# Patient Record
Sex: Male | Born: 1980 | Race: White | Hispanic: No | Marital: Single | Smoking: Former smoker
Health system: Southern US, Community
[De-identification: ages and names within clinical notes are randomized; demographics above are authoritative.]

## PROBLEM LIST (undated history)

## (undated) ENCOUNTER — Emergency Department (HOSPITAL_COMMUNITY): Payer: Self-pay | Source: Home / Self Care

## (undated) ENCOUNTER — Ambulatory Visit (HOSPITAL_COMMUNITY): Disposition: A | Discharge status: Home / Self Care | Payer: BLUE CROSS/BLUE SHIELD

## (undated) DIAGNOSIS — A4902 Methicillin resistant Staphylococcus aureus infection, unspecified site: Secondary | ICD-10-CM

## (undated) DIAGNOSIS — F319 Bipolar disorder, unspecified: Secondary | ICD-10-CM

## (undated) DIAGNOSIS — K029 Dental caries, unspecified: Secondary | ICD-10-CM

## (undated) DIAGNOSIS — F1910 Other psychoactive substance abuse, uncomplicated: Secondary | ICD-10-CM

## (undated) DIAGNOSIS — E785 Hyperlipidemia, unspecified: Secondary | ICD-10-CM

## (undated) DIAGNOSIS — F411 Generalized anxiety disorder: Secondary | ICD-10-CM

## (undated) DIAGNOSIS — F1120 Opioid dependence, uncomplicated: Secondary | ICD-10-CM

## (undated) DIAGNOSIS — Z5900 Homelessness unspecified: Secondary | ICD-10-CM

## (undated) DIAGNOSIS — G8929 Other chronic pain: Secondary | ICD-10-CM

## (undated) HISTORY — PX: MEDIAL COLLATERAL LIGAMENT AND LATERAL COLLATERAL LIGAMENT REPAIR, KNEE: SHX2017

## (undated) HISTORY — PX: LUMBAR FUSION: SHX111

## (undated) HISTORY — PX: BACK SURGERY: SHX140

## (undated) HISTORY — PX: DENTAL SURGERY: SHX609

## (undated) HISTORY — PX: OTHER SURGICAL HISTORY: SHX169

---
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
## 2001-01-16 ENCOUNTER — Encounter: Payer: Self-pay | Admitting: Orthopedic Surgery

## 2001-01-16 ENCOUNTER — Ambulatory Visit (HOSPITAL_COMMUNITY): Admission: RE | Admit: 2001-01-16 | Discharge: 2001-01-16 | Payer: Self-pay | Admitting: Orthopedic Surgery

## 2001-02-14 ENCOUNTER — Ambulatory Visit (HOSPITAL_COMMUNITY): Admission: RE | Admit: 2001-02-14 | Discharge: 2001-02-15 | Payer: Self-pay | Admitting: *Deleted

## 2001-02-14 ENCOUNTER — Encounter: Payer: Self-pay | Admitting: *Deleted

## 2003-02-18 ENCOUNTER — Emergency Department (HOSPITAL_COMMUNITY): Admission: EM | Admit: 2003-02-18 | Discharge: 2003-02-18 | Payer: Self-pay | Admitting: Emergency Medicine

## 2003-11-14 ENCOUNTER — Emergency Department (HOSPITAL_COMMUNITY): Admission: EM | Admit: 2003-11-14 | Discharge: 2003-11-14 | Payer: Self-pay | Admitting: Emergency Medicine

## 2003-11-15 ENCOUNTER — Emergency Department (HOSPITAL_COMMUNITY): Admission: EM | Admit: 2003-11-15 | Discharge: 2003-11-15 | Payer: Self-pay | Admitting: Emergency Medicine

## 2004-03-01 ENCOUNTER — Emergency Department (HOSPITAL_COMMUNITY): Admission: EM | Admit: 2004-03-01 | Discharge: 2004-03-01 | Payer: Self-pay | Admitting: Emergency Medicine

## 2004-03-16 ENCOUNTER — Emergency Department (HOSPITAL_COMMUNITY): Admission: EM | Admit: 2004-03-16 | Discharge: 2004-03-16 | Payer: Self-pay | Admitting: Emergency Medicine

## 2004-04-13 ENCOUNTER — Emergency Department (HOSPITAL_COMMUNITY): Admission: EM | Admit: 2004-04-13 | Discharge: 2004-04-13 | Payer: Self-pay | Admitting: Emergency Medicine

## 2004-04-26 ENCOUNTER — Encounter: Payer: Self-pay | Admitting: Family Medicine

## 2004-04-26 ENCOUNTER — Emergency Department (HOSPITAL_COMMUNITY): Admission: EM | Admit: 2004-04-26 | Discharge: 2004-04-26 | Payer: Self-pay | Admitting: Family Medicine

## 2004-05-09 ENCOUNTER — Emergency Department (HOSPITAL_COMMUNITY): Admission: EM | Admit: 2004-05-09 | Discharge: 2004-05-09 | Payer: Self-pay | Admitting: Emergency Medicine

## 2004-05-20 ENCOUNTER — Emergency Department (HOSPITAL_COMMUNITY): Admission: EM | Admit: 2004-05-20 | Discharge: 2004-05-20 | Payer: Self-pay | Admitting: Emergency Medicine

## 2004-06-06 ENCOUNTER — Emergency Department (HOSPITAL_COMMUNITY): Admission: EM | Admit: 2004-06-06 | Discharge: 2004-06-06 | Payer: Self-pay | Admitting: Emergency Medicine

## 2004-07-30 ENCOUNTER — Emergency Department (HOSPITAL_COMMUNITY): Admission: EM | Admit: 2004-07-30 | Discharge: 2004-07-30 | Payer: Self-pay | Admitting: Emergency Medicine

## 2004-08-15 ENCOUNTER — Emergency Department (HOSPITAL_COMMUNITY): Admission: EM | Admit: 2004-08-15 | Discharge: 2004-08-15 | Payer: Self-pay | Admitting: Emergency Medicine

## 2005-09-22 ENCOUNTER — Emergency Department (HOSPITAL_COMMUNITY): Admission: EM | Admit: 2005-09-22 | Discharge: 2005-09-22 | Payer: Self-pay | Admitting: Emergency Medicine

## 2005-10-25 ENCOUNTER — Emergency Department (HOSPITAL_COMMUNITY): Admission: EM | Admit: 2005-10-25 | Discharge: 2005-10-25 | Payer: Self-pay | Admitting: Emergency Medicine

## 2005-11-12 ENCOUNTER — Emergency Department (HOSPITAL_COMMUNITY): Admission: EM | Admit: 2005-11-12 | Discharge: 2005-11-12 | Payer: Self-pay | Admitting: Emergency Medicine

## 2005-11-22 ENCOUNTER — Emergency Department (HOSPITAL_COMMUNITY): Admission: EM | Admit: 2005-11-22 | Discharge: 2005-11-22 | Payer: Self-pay | Admitting: Emergency Medicine

## 2005-11-26 ENCOUNTER — Emergency Department (HOSPITAL_COMMUNITY): Admission: EM | Admit: 2005-11-26 | Discharge: 2005-11-26 | Payer: Self-pay | Admitting: Family Medicine

## 2005-12-01 ENCOUNTER — Emergency Department (HOSPITAL_COMMUNITY): Admission: EM | Admit: 2005-12-01 | Discharge: 2005-12-01 | Payer: Self-pay | Admitting: Emergency Medicine

## 2005-12-05 ENCOUNTER — Emergency Department: Payer: Self-pay | Admitting: Internal Medicine

## 2005-12-15 ENCOUNTER — Emergency Department (HOSPITAL_COMMUNITY): Admission: EM | Admit: 2005-12-15 | Discharge: 2005-12-15 | Payer: Self-pay | Admitting: Emergency Medicine

## 2005-12-16 ENCOUNTER — Ambulatory Visit: Payer: Self-pay | Admitting: Family Medicine

## 2005-12-18 ENCOUNTER — Ambulatory Visit (HOSPITAL_COMMUNITY): Admission: RE | Admit: 2005-12-18 | Discharge: 2005-12-18 | Payer: Self-pay | Admitting: Internal Medicine

## 2006-01-09 ENCOUNTER — Emergency Department (HOSPITAL_COMMUNITY): Admission: EM | Admit: 2006-01-09 | Discharge: 2006-01-09 | Payer: Self-pay | Admitting: Emergency Medicine

## 2006-01-13 ENCOUNTER — Emergency Department: Payer: Self-pay | Admitting: Emergency Medicine

## 2006-01-19 ENCOUNTER — Emergency Department (HOSPITAL_COMMUNITY): Admission: EM | Admit: 2006-01-19 | Discharge: 2006-01-19 | Payer: Self-pay | Admitting: Family Medicine

## 2006-03-03 ENCOUNTER — Emergency Department (HOSPITAL_COMMUNITY): Admission: EM | Admit: 2006-03-03 | Discharge: 2006-03-03 | Payer: Self-pay | Admitting: Emergency Medicine

## 2006-03-08 ENCOUNTER — Emergency Department (HOSPITAL_COMMUNITY): Admission: EM | Admit: 2006-03-08 | Discharge: 2006-03-08 | Payer: Self-pay | Admitting: Emergency Medicine

## 2006-04-26 ENCOUNTER — Encounter
Admission: RE | Admit: 2006-04-26 | Discharge: 2006-07-25 | Payer: Self-pay | Admitting: Physical Medicine and Rehabilitation

## 2006-09-03 ENCOUNTER — Emergency Department (HOSPITAL_COMMUNITY): Admission: EM | Admit: 2006-09-03 | Discharge: 2006-09-03 | Payer: Self-pay | Admitting: Family Medicine

## 2006-09-11 ENCOUNTER — Emergency Department (HOSPITAL_COMMUNITY): Admission: EM | Admit: 2006-09-11 | Discharge: 2006-09-11 | Payer: Self-pay | Admitting: Emergency Medicine

## 2009-04-04 ENCOUNTER — Emergency Department (HOSPITAL_COMMUNITY): Admission: EM | Admit: 2009-04-04 | Discharge: 2009-04-04 | Payer: Self-pay | Admitting: Family Medicine

## 2009-06-20 ENCOUNTER — Emergency Department (HOSPITAL_COMMUNITY): Admission: EM | Admit: 2009-06-20 | Discharge: 2009-06-20 | Payer: Self-pay | Admitting: Emergency Medicine

## 2009-07-01 ENCOUNTER — Emergency Department (HOSPITAL_COMMUNITY): Admission: EM | Admit: 2009-07-01 | Discharge: 2009-07-01 | Payer: Self-pay | Admitting: Emergency Medicine

## 2009-07-13 ENCOUNTER — Emergency Department (HOSPITAL_COMMUNITY): Admission: EM | Admit: 2009-07-13 | Discharge: 2009-07-13 | Payer: Self-pay | Admitting: Family Medicine

## 2009-08-19 ENCOUNTER — Emergency Department (HOSPITAL_COMMUNITY): Admission: EM | Admit: 2009-08-19 | Discharge: 2009-08-19 | Payer: Self-pay | Admitting: Family Medicine

## 2010-10-11 ENCOUNTER — Encounter: Payer: Self-pay | Admitting: Orthopaedic Surgery

## 2010-12-24 LAB — ETHANOL: Alcohol, Ethyl (B): <5 mg/dL (ref 0–10)

## 2010-12-24 LAB — BASIC METABOLIC PANEL
BUN: 5 mg/dL — ABNORMAL LOW (ref 6–23)
Chloride: 100 mEq/L (ref 96–112)
Potassium: 4.2 mEq/L (ref 3.5–5.1)

## 2010-12-24 LAB — DIFFERENTIAL
Eosinophils Absolute: 0.3 10*3/uL (ref 0.0–0.7)
Eosinophils Relative: 3 % (ref 0–5)
Lymphs Abs: 4.7 10*3/uL — ABNORMAL HIGH (ref 0.7–4.0)
Monocytes Absolute: 0.4 10*3/uL (ref 0.1–1.0)
Monocytes Relative: 4 % (ref 3–12)

## 2010-12-24 LAB — CBC
HCT: 38.2 % — ABNORMAL LOW (ref 39.0–52.0)
MCV: 93.9 fL (ref 78.0–100.0)
Platelets: 280 10*3/uL (ref 150–400)
RBC: 4.07 MIL/uL — ABNORMAL LOW (ref 4.22–5.81)
WBC: 10.5 10*3/uL (ref 4.0–10.5)

## 2010-12-24 LAB — RAPID URINE DRUG SCREEN, HOSP PERFORMED: Cocaine: NOT DETECTED
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
## 2011-02-05 NOTE — Op Note (Signed)
[place]. [facility]  Patient:    Trevor [name], Trevor [name]                      MRN: [id number] Proc. Date: 02/14/01 Adm. Date:  [id number] Attending:  Maryanna Shape                           Operative Report  PREOPERATIVE DIAGNOSIS:  Herniated nucleus pulposus L4-5 left.  POSTOPERATIVE DIAGNOSIS:  Herniated nucleus pulposus L4-5 left.  OPERATIVE PROCEDURE:  Microdiskectomy L4-5 left.  ANESTHESIA:  General.  SURGEON:  Fritzi Mandes, M.D.  ASSISTANT:  Cammy Copa, M.D.  DESCRIPTION OF PROCEDURE:  The patient was given a general anesthesia via endotracheal tube and then placed on the operative frame, prepped and draped in the usual manner for sterility.  This included the use of a Vi-Drape.  After the drape, I placed 18 gauge spinal needles in what I thought was the L4-5 and L5-S1 interspaces.  I took x-rays to confirm.  On the basis of that identified the L4-5 interspace.  I made an incision over the posterior spine centered on the L4-5 interspace.  I opened up the lumbar fascia just lateral to the midline, subperiosteally elevated the paraspinal muscles with a Taylor retractor in place.  I exposed and defined the L4-5 interspace and then excised the ligamentum flavum and carefully decompressed distally and laterally as I expected the herniated disk could be at the level of the pedicle.  Having done that, then I palpated with the #4 Penfield the L5 nerve root to be fairly snug over moderately large disk.  The nerve root was gradually freed up and moved medially and held there with a nerve retractor and very soft herniated disk was out in a pouch around the disk space was opened and fairly large mass of disk material eventually removed with multiple passes of the micropituitaries.  I probed this sac with Epstein curet and removed some small bits.  I probed the disk space likewise.  After many passes I was satisfied that the disk space had been  emptied of all loose material.  The area was irrigated and then we paid attention to the nerve root.  It seemed that there was a little bit overlying the lateral bone that could contribute to compression.  Therefore, I removed some of the lateral gutter to the pedicle and then did a foraminotomy so that I could see the nerve root and it felt quite clear the whole way.  I passed a ball-tipped nerve hook under and over proximally distally about the nerve root and dural sac it was all free.  The area was copiously irrigated.  Retractors were removed.  Lumbar fascia was reapproximated with figure-of-eight sutures and #1 Vicryl.  The subcutaneous tissues were reapproximated with 2-0 Vicryl.  The skin edges on that position with metal staples.  The dressing was applied and the patient returned to the recovery room in good condition.  There was no significant bleeding throughout the case. DD:  02/14/01 TD:  02/14/01 Job: [id number]

## 2011-05-20 ENCOUNTER — Inpatient Hospital Stay (INDEPENDENT_AMBULATORY_CARE_PROVIDER_SITE_OTHER)
Admission: RE | Admit: 2011-05-20 | Discharge: 2011-05-20 | Disposition: A | Discharge status: Home / Self Care | Payer: Self-pay | Source: Ambulatory Visit | Attending: Emergency Medicine | Admitting: Emergency Medicine

## 2011-05-20 ENCOUNTER — Emergency Department (HOSPITAL_COMMUNITY): Payer: Self-pay

## 2011-05-20 ENCOUNTER — Emergency Department (HOSPITAL_COMMUNITY)
Admission: EM | Admit: 2011-05-20 | Discharge: 2011-05-21 | Disposition: A | Discharge status: Home / Self Care | Payer: Self-pay | Attending: Emergency Medicine | Admitting: Emergency Medicine

## 2011-05-20 DIAGNOSIS — M25569 Pain in unspecified knee: Secondary | ICD-10-CM | POA: Insufficient documentation

## 2011-05-20 DIAGNOSIS — M1990 Unspecified osteoarthritis, unspecified site: Secondary | ICD-10-CM | POA: Insufficient documentation

## 2011-05-20 DIAGNOSIS — M79609 Pain in unspecified limb: Secondary | ICD-10-CM

## 2011-05-28 ENCOUNTER — Other Ambulatory Visit (HOSPITAL_COMMUNITY): Payer: Self-pay | Admitting: Orthopaedic Surgery

## 2011-05-28 DIAGNOSIS — M25561 Pain in right knee: Secondary | ICD-10-CM

## 2011-06-04 ENCOUNTER — Ambulatory Visit (HOSPITAL_COMMUNITY)
Admission: RE | Admit: 2011-06-04 | Discharge: 2011-06-04 | Disposition: A | Discharge status: Home / Self Care | Payer: Self-pay | Source: Ambulatory Visit | Attending: Orthopaedic Surgery | Admitting: Orthopaedic Surgery

## 2011-06-04 DIAGNOSIS — X58XXXA Exposure to other specified factors, initial encounter: Secondary | ICD-10-CM | POA: Insufficient documentation

## 2011-06-04 DIAGNOSIS — M6281 Muscle weakness (generalized): Secondary | ICD-10-CM | POA: Insufficient documentation

## 2011-06-04 DIAGNOSIS — M25469 Effusion, unspecified knee: Secondary | ICD-10-CM | POA: Insufficient documentation

## 2011-06-04 DIAGNOSIS — M7120 Synovial cyst of popliteal space [Baker], unspecified knee: Secondary | ICD-10-CM | POA: Insufficient documentation

## 2011-06-04 DIAGNOSIS — IMO0002 Reserved for concepts with insufficient information to code with codable children: Secondary | ICD-10-CM | POA: Insufficient documentation

## 2011-06-04 DIAGNOSIS — M25569 Pain in unspecified knee: Secondary | ICD-10-CM | POA: Insufficient documentation

## 2011-06-04 DIAGNOSIS — M25561 Pain in right knee: Secondary | ICD-10-CM

## 2011-06-10 ENCOUNTER — Inpatient Hospital Stay (INDEPENDENT_AMBULATORY_CARE_PROVIDER_SITE_OTHER)
Admission: RE | Admit: 2011-06-10 | Discharge: 2011-06-10 | Disposition: A | Discharge status: Home / Self Care | Payer: Self-pay | Source: Ambulatory Visit | Attending: Emergency Medicine | Admitting: Emergency Medicine

## 2011-06-10 DIAGNOSIS — K047 Periapical abscess without sinus: Secondary | ICD-10-CM

## 2011-06-10 DIAGNOSIS — K029 Dental caries, unspecified: Secondary | ICD-10-CM

## 2011-06-18 ENCOUNTER — Other Ambulatory Visit (HOSPITAL_COMMUNITY): Payer: Self-pay | Admitting: Orthopaedic Surgery

## 2011-06-18 ENCOUNTER — Encounter (HOSPITAL_COMMUNITY): Payer: Self-pay

## 2011-06-18 ENCOUNTER — Ambulatory Visit (HOSPITAL_COMMUNITY)
Admission: RE | Admit: 2011-06-18 | Discharge: 2011-06-18 | Disposition: A | Discharge status: Home / Self Care | Payer: Self-pay | Source: Ambulatory Visit | Attending: Orthopaedic Surgery | Admitting: Orthopaedic Surgery

## 2011-06-18 ENCOUNTER — Other Ambulatory Visit: Payer: Self-pay | Admitting: Orthopaedic Surgery

## 2011-06-18 DIAGNOSIS — Z01812 Encounter for preprocedural laboratory examination: Secondary | ICD-10-CM | POA: Insufficient documentation

## 2011-06-18 DIAGNOSIS — Z01818 Encounter for other preprocedural examination: Secondary | ICD-10-CM | POA: Insufficient documentation

## 2011-06-18 LAB — CBC
MCH: 30.1 pg (ref 26.0–34.0)
MCHC: 35.2 g/dL (ref 30.0–36.0)
MCV: 85.5 fL (ref 78.0–100.0)
Platelets: 298 10*3/uL (ref 150–400)
RDW: 14.2 % (ref 11.5–15.5)

## 2011-06-22 ENCOUNTER — Ambulatory Visit (HOSPITAL_COMMUNITY): Admission: RE | Admit: 2011-06-22 | Payer: Self-pay | Source: Ambulatory Visit | Admitting: Orthopaedic Surgery

## 2011-06-29 ENCOUNTER — Ambulatory Visit (HOSPITAL_COMMUNITY)
Admission: RE | Admit: 2011-06-29 | Discharge: 2011-06-29 | Disposition: A | Discharge status: Home / Self Care | Payer: Self-pay | Source: Ambulatory Visit | Attending: Orthopaedic Surgery | Admitting: Orthopaedic Surgery

## 2011-06-29 DIAGNOSIS — X58XXXA Exposure to other specified factors, initial encounter: Secondary | ICD-10-CM | POA: Insufficient documentation

## 2011-06-29 DIAGNOSIS — M25569 Pain in unspecified knee: Secondary | ICD-10-CM | POA: Insufficient documentation

## 2011-06-29 DIAGNOSIS — IMO0002 Reserved for concepts with insufficient information to code with codable children: Secondary | ICD-10-CM | POA: Insufficient documentation

## 2011-06-29 DIAGNOSIS — Z79899 Other long term (current) drug therapy: Secondary | ICD-10-CM | POA: Insufficient documentation
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
## 2013-04-16 ENCOUNTER — Emergency Department (HOSPITAL_COMMUNITY)
Admission: EM | Admit: 2013-04-16 | Discharge: 2013-04-16 | Disposition: A | Discharge status: Home / Self Care | Payer: No Typology Code available for payment source | Attending: Emergency Medicine | Admitting: Emergency Medicine

## 2013-04-16 ENCOUNTER — Emergency Department (HOSPITAL_COMMUNITY): Payer: No Typology Code available for payment source

## 2013-04-16 DIAGNOSIS — R000 Tachycardia, unspecified: Secondary | ICD-10-CM | POA: Insufficient documentation

## 2013-04-16 DIAGNOSIS — M542 Cervicalgia: Secondary | ICD-10-CM | POA: Insufficient documentation

## 2013-04-16 DIAGNOSIS — K029 Dental caries, unspecified: Secondary | ICD-10-CM | POA: Insufficient documentation

## 2013-04-16 DIAGNOSIS — S5010XA Contusion of unspecified forearm, initial encounter: Secondary | ICD-10-CM | POA: Insufficient documentation

## 2013-04-16 DIAGNOSIS — Y9389 Activity, other specified: Secondary | ICD-10-CM | POA: Insufficient documentation

## 2013-04-16 DIAGNOSIS — S025XXA Fracture of tooth (traumatic), initial encounter for closed fracture: Secondary | ICD-10-CM | POA: Insufficient documentation

## 2013-04-16 DIAGNOSIS — Y92410 Unspecified street and highway as the place of occurrence of the external cause: Secondary | ICD-10-CM | POA: Insufficient documentation

## 2013-04-16 DIAGNOSIS — S02600A Fracture of unspecified part of body of mandible, initial encounter for closed fracture: Secondary | ICD-10-CM | POA: Insufficient documentation

## 2013-04-16 DIAGNOSIS — Z23 Encounter for immunization: Secondary | ICD-10-CM | POA: Insufficient documentation

## 2013-04-16 DIAGNOSIS — R220 Localized swelling, mass and lump, head: Secondary | ICD-10-CM | POA: Insufficient documentation

## 2013-04-16 DIAGNOSIS — IMO0002 Reserved for concepts with insufficient information to code with codable children: Secondary | ICD-10-CM | POA: Insufficient documentation

## 2013-04-16 DIAGNOSIS — S60219A Contusion of unspecified wrist, initial encounter: Secondary | ICD-10-CM | POA: Insufficient documentation

## 2013-04-16 DIAGNOSIS — R404 Transient alteration of awareness: Secondary | ICD-10-CM | POA: Insufficient documentation

## 2013-04-16 DIAGNOSIS — S02609A Fracture of mandible, unspecified, initial encounter for closed fracture: Secondary | ICD-10-CM

## 2013-04-16 DIAGNOSIS — R221 Localized swelling, mass and lump, neck: Secondary | ICD-10-CM | POA: Insufficient documentation

## 2013-04-16 DIAGNOSIS — S60212A Contusion of left wrist, initial encounter: Secondary | ICD-10-CM

## 2013-04-16 DIAGNOSIS — S5011XA Contusion of right forearm, initial encounter: Secondary | ICD-10-CM

## 2013-04-16 LAB — RAPID URINE DRUG SCREEN, HOSP PERFORMED
Amphetamines: NOT DETECTED
Benzodiazepines: NOT DETECTED
Opiates: NOT DETECTED

## 2013-04-16 LAB — POCT I-STAT, CHEM 8
Calcium, Ion: 1.13 mmol/L (ref 1.12–1.23)
Glucose, Bld: 112 mg/dL — ABNORMAL HIGH (ref 70–99)
HCT: 42 % (ref 39.0–52.0)
Hemoglobin: 14.3 g/dL (ref 13.0–17.0)
TCO2: 25 mmol/L (ref 0–100)

## 2013-04-16 LAB — CBC WITH DIFFERENTIAL/PLATELET
Basophils Absolute: 0.1 10*3/uL (ref 0.0–0.1)
Eosinophils Absolute: 0.3 10*3/uL (ref 0.0–0.7)
Eosinophils Relative: 2 % (ref 0–5)
Lymphocytes Relative: 33 % (ref 12–46)
Lymphs Abs: 5.4 10*3/uL — ABNORMAL HIGH (ref 0.7–4.0)
Neutrophils Relative %: 61 % (ref 43–77)
Platelets: 272 10*3/uL (ref 150–400)
RBC: 4.07 MIL/uL — ABNORMAL LOW (ref 4.22–5.81)
RDW: 14.3 % (ref 11.5–15.5)
WBC: 16.1 10*3/uL — ABNORMAL HIGH (ref 4.0–10.5)

## 2013-04-16 LAB — COMPREHENSIVE METABOLIC PANEL
ALT: 17 U/L (ref 0–53)
Alkaline Phosphatase: 66 U/L (ref 39–117)
BUN: 8 mg/dL (ref 6–23)
CO2: 25 mEq/L (ref 19–32)
GFR calc Af Amer: >90 mL/min (ref >90)
GFR calc non Af Amer: >90 mL/min (ref >90)
Glucose, Bld: 111 mg/dL — ABNORMAL HIGH (ref 70–99)
Potassium: 3.5 mEq/L (ref 3.5–5.1)
Sodium: 130 mEq/L — ABNORMAL LOW (ref 135–145)
Total Protein: 7.6 g/dL (ref 6.0–8.3)

## 2013-04-16 LAB — ETHANOL: Alcohol, Ethyl (B): <11 mg/dL (ref 0–11)

## 2013-04-16 LAB — CG4 I-STAT (LACTIC ACID): Lactic Acid, Venous: 1.53 mmol/L (ref 0.5–2.2)

## 2013-04-16 MED ORDER — LORAZEPAM 2 MG/ML IJ SOLN
INTRAMUSCULAR | Status: AC
  Administered 2013-04-16: 1 mg via INTRAVENOUS
  Filled 2013-04-16: qty 1

## 2013-04-16 MED ORDER — OXYCODONE-ACETAMINOPHEN 5-325 MG/5ML PO SOLN: 10.0000 mL | Freq: Four times a day (QID) | ORAL | Status: DC | PRN

## 2013-04-16 MED ORDER — TETANUS-DIPHTHERIA TOXOIDS TD 5-2 LFU IM INJ: 0.5000 mL | INJECTION | Freq: Once | INTRAMUSCULAR | Status: DC

## 2013-04-16 MED ORDER — HYDROMORPHONE HCL PF 1 MG/ML IJ SOLN
1.0000 mg | Freq: Once | INTRAMUSCULAR | Status: AC
  Administered 2013-04-16: 1 mg via INTRAVENOUS

## 2013-04-16 MED ORDER — TETANUS-DIPHTH-ACELL PERTUSSIS 5-2.5-18.5 LF-MCG/0.5 IM SUSP
INTRAMUSCULAR | Status: AC
  Administered 2013-04-16: 0.5 mL via INTRAMUSCULAR
  Filled 2013-04-16: qty 0.5

## 2013-04-16 MED ORDER — HYDROMORPHONE HCL PF 1 MG/ML IJ SOLN
INTRAMUSCULAR | Status: AC
  Administered 2013-04-16: 1 mg via INTRAVENOUS
  Filled 2013-04-16: qty 1

## 2013-04-16 MED ORDER — TETANUS-DIPHTH-ACELL PERTUSSIS 5-2.5-18.5 LF-MCG/0.5 IM SUSP: 0.5000 mL | Freq: Once | INTRAMUSCULAR | Status: AC

## 2013-04-16 MED ORDER — HYDROMORPHONE HCL PF 1 MG/ML IJ SOLN: 1.0000 mg | Freq: Once | INTRAMUSCULAR | Status: AC

## 2013-04-16 MED ORDER — OXYCODONE-ACETAMINOPHEN 5-325 MG/5ML PO SOLN
10.0000 mL | Freq: Once | ORAL | Status: AC
  Administered 2013-04-16: 10 mL via ORAL

## 2013-04-16 MED ORDER — LORAZEPAM 2 MG/ML IJ SOLN: 1.0000 mg | Freq: Once | INTRAMUSCULAR | Status: AC

## 2013-04-16 MED ORDER — HYDROMORPHONE HCL PF 1 MG/ML IJ SOLN
INTRAMUSCULAR | Status: AC
  Filled 2013-04-16: qty 1

## 2013-04-16 MED ORDER — HYDROMORPHONE HCL PF 1 MG/ML IJ SOLN
INTRAMUSCULAR | Status: AC
Start: 2013-04-16 — End: 2013-04-16
  Administered 2013-04-16: 1 mg via INTRAVENOUS
  Filled 2013-04-16: qty 1

## 2013-04-16 NOTE — ED Notes (Signed)
EDP and RES at bedside. Pt on monitor. Lab at bedside. Attempting 2nd IV.

## 2013-04-16 NOTE — ED Notes (Addendum)
Mother has received discharge instructions from Dr. Jenne Pane. Pt given prescription for pain medication and has number to call first thing in the am for a followup appoinment. Mother and pt verbalized understanding of importance of followup. Pt given paper gown.

## 2013-04-16 NOTE — ED Notes (Signed)
Pts hands, forearms, and chest cleaned of blood. Unable to clean face very well due to amount of pain patient is in. Pt still has small amount of blood draining from his teeth, emesis bag at bedside, pt does not want the suction.

## 2013-04-16 NOTE — ED Notes (Signed)
RES at bedside updating pt and mother
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
## 2013-04-16 NOTE — ED Notes (Signed)
Mother at bedside. Pt given wet rag for face and a bag to spit it. Pt with bright red blood from mouth. Left eye is more swollen at this time, pt barely able to open it at this time. Pt is moving his right arm with no difficulty at this moment, he is using it to wipe his mouth.

## 2013-04-16 NOTE — ED Notes (Signed)
Pt was discharged at 2110
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
## 2013-04-16 NOTE — ED Notes (Signed)
Pt assisted to use urinal.  

## 2013-04-16 NOTE — ED Notes (Signed)
Pt remains out of department

## 2013-04-16 NOTE — ED Notes (Signed)
ENT at bedside
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
## 2013-04-16 NOTE — ED Notes (Signed)
ENT at bedside. POC to control patients pain and send him home for followup.
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
## 2013-09-04 ENCOUNTER — Emergency Department (HOSPITAL_COMMUNITY)
Admission: EM | Admit: 2013-09-04 | Discharge: 2013-09-04 | Disposition: A | Discharge status: Home / Self Care | Payer: Self-pay | Attending: Emergency Medicine | Admitting: Emergency Medicine

## 2013-09-04 ENCOUNTER — Encounter (HOSPITAL_COMMUNITY): Payer: Self-pay | Admitting: Emergency Medicine

## 2013-09-04 DIAGNOSIS — Z79899 Other long term (current) drug therapy: Secondary | ICD-10-CM | POA: Insufficient documentation

## 2013-09-04 DIAGNOSIS — L02419 Cutaneous abscess of limb, unspecified: Secondary | ICD-10-CM | POA: Insufficient documentation

## 2013-09-04 DIAGNOSIS — Z8719 Personal history of other diseases of the digestive system: Secondary | ICD-10-CM | POA: Insufficient documentation

## 2013-09-04 DIAGNOSIS — R21 Rash and other nonspecific skin eruption: Secondary | ICD-10-CM | POA: Insufficient documentation

## 2013-09-04 DIAGNOSIS — F17200 Nicotine dependence, unspecified, uncomplicated: Secondary | ICD-10-CM | POA: Insufficient documentation

## 2013-09-04 DIAGNOSIS — L0390 Cellulitis, unspecified: Secondary | ICD-10-CM

## 2013-09-04 HISTORY — DX: Dental caries, unspecified: K02.9

## 2013-09-04 HISTORY — DX: Other psychoactive substance abuse, uncomplicated: F19.10

## 2013-09-04 LAB — CBC WITH DIFFERENTIAL/PLATELET
Basophils Relative: 0 % (ref 0–1)
Eosinophils Absolute: 0.1 10*3/uL (ref 0.0–0.7)
HCT: 38.3 % — ABNORMAL LOW (ref 39.0–52.0)
Lymphocytes Relative: 20 % (ref 12–46)
MCH: 30.2 pg (ref 26.0–34.0)
MCHC: 33.9 g/dL (ref 30.0–36.0)
Neutro Abs: 10.3 10*3/uL — ABNORMAL HIGH (ref 1.7–7.7)
Neutrophils Relative %: 73 % (ref 43–77)
Platelets: 297 10*3/uL (ref 150–400)
RDW: 12.8 % (ref 11.5–15.5)
WBC: 14.2 10*3/uL — ABNORMAL HIGH (ref 4.0–10.5)

## 2013-09-04 MED ORDER — CLINDAMYCIN PHOSPHATE 600 MG/50ML IV SOLN
600.0000 mg | Freq: Once | INTRAVENOUS | Status: AC
  Administered 2013-09-04: 600 mg via INTRAVENOUS
  Filled 2013-09-04 (×2): qty 50

## 2013-09-04 MED ORDER — SULFAMETHOXAZOLE-TRIMETHOPRIM 800-160 MG PO TABS: 1.0000 | ORAL_TABLET | Freq: Two times a day (BID) | ORAL | Status: AC

## 2013-09-04 MED ORDER — ALPRAZOLAM 1 MG PO TABS: 1.0000 mg | ORAL_TABLET | Freq: Every evening | ORAL | Status: DC | PRN

## 2013-09-04 MED ORDER — SODIUM CHLORIDE 0.9 % IV SOLN
Freq: Once | INTRAVENOUS | Status: AC
  Administered 2013-09-04: 12:00:00 via INTRAVENOUS

## 2013-09-04 NOTE — Progress Notes (Signed)
[facility] provided pt with a [employer] of the [place]. Also, provided pt with a list of primary care resources.

## 2013-09-04 NOTE — ED Notes (Signed)
Pt reports pain, swelling and redness in r/outer calf. Pt reports "popping" the red raised spot on leg and released cloudy, thick, green liquid
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
## 2013-09-08 NOTE — ED Provider Notes (Signed)
Medical screening examination/treatment/procedure(s) were performed by non-physician practitioner and as supervising physician I was immediately available for consultation/collaboration.  Azlaan Isidore T Lemont Sitzmann, MD 09/08/13 1624 

## 2013-10-13 ENCOUNTER — Emergency Department (HOSPITAL_COMMUNITY)
Admission: EM | Admit: 2013-10-13 | Discharge: 2013-10-13 | Disposition: A | Discharge status: Home / Self Care | Payer: Self-pay | Attending: Emergency Medicine | Admitting: Emergency Medicine

## 2013-10-13 ENCOUNTER — Encounter (HOSPITAL_COMMUNITY): Payer: Self-pay | Admitting: Emergency Medicine

## 2013-10-13 DIAGNOSIS — K130 Diseases of lips: Secondary | ICD-10-CM

## 2013-10-13 DIAGNOSIS — Z8614 Personal history of Methicillin resistant Staphylococcus aureus infection: Secondary | ICD-10-CM | POA: Insufficient documentation

## 2013-10-13 HISTORY — DX: Methicillin resistant Staphylococcus aureus infection, unspecified site: A49.02

## 2013-10-13 MED ORDER — SULFAMETHOXAZOLE-TRIMETHOPRIM 800-160 MG PO TABS
1.0000 | ORAL_TABLET | Freq: Two times a day (BID) | ORAL | Status: DC
Start: 2013-10-13 — End: 2023-10-13

## 2013-10-13 NOTE — Discharge Instructions (Signed)
Use warm compresses over your infected area to help you modify infection. Return for any changing or worsening symptoms.    Abscess An abscess (boil or furuncle) is an infected area on or under the skin. This area is filled with yellowish-white fluid (pus) and other material (debris). HOME CARE   Only take medicines as told by your doctor.  If you were given antibiotic medicine, take it as directed. Finish the medicine even if you start to feel better.  If gauze is used, follow your doctor's directions for changing the gauze.  To avoid spreading the infection:  Keep your abscess covered with a bandage.  Wash your hands well.  Do not share personal care items, towels, or whirlpools with others.  Avoid skin contact with others.  Keep your skin and clothes clean around the abscess.  Keep all doctor visits as told. GET HELP RIGHT AWAY IF:   You have more pain, puffiness (swelling), or redness in the wound site.  You have more fluid or blood coming from the wound site.  You have muscle aches, chills, or you feel sick.  You have a fever. MAKE SURE YOU:   Understand these instructions.  Will watch your condition.  Will get help right away if you are not doing well or get worse. Document Released: 02/23/2008 Document Revised: 03/07/2012 Document Reviewed: 11/19/2011 [facility] Patient Information 2014 [employer], [place].

## 2013-10-13 NOTE — ED Notes (Signed)
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

## 2013-10-13 NOTE — ED Provider Notes (Signed)
CSN: [id number]     Arrival date & time 10/13/13  [id number] History   First MD Initiated Contact with Patient 10/13/13 [phone]     Chief Complaint  Patient presents with  . Abscess   HPI  History provided by the patient. Patient is a 33 year old male with past history of skin MRSA infections who presents with concerns for similar infection to his right upper lip. Patient states that he first noticed a pimple that he squeezed and popped several days ago. He thought the area was improving however 3 days ago he started to become red swollen and tender. This will was worse each day. He has not tried to pop the area since. He has been using alcohol over that area without any change. Patient states last time he had a skin MRSA infection to his right lower leg he was given Bactrim and symptoms cleared. He denies any other associated symptoms. No fever, chills or sweats.    Past Medical History  Diagnosis Date  . MRSA infection    History reviewed. No pertinent past surgical history. History reviewed. No pertinent family history. History  Substance Use Topics  . Smoking status: Not on file  . Smokeless tobacco: Not on file  . Alcohol Use: No    Review of Systems  Constitutional: Negative for fever.  All other systems reviewed and are negative.    Allergies  Review of patient's allergies indicates no known allergies.  Home Medications  No current outpatient prescriptions on file. BP 146/85  Pulse 83  Temp(Src) 98.2 F (36.8 C) (Oral)  Resp 20  Wt 192 lb (87.091 kg)  SpO2 96% Physical Exam  Nursing note and vitals reviewed. Constitutional: He is oriented to person, place, and time. He appears well-developed and well-nourished.  HENT:  Head: Normocephalic.  Mouth/Throat: Oropharynx is clear and moist.  Swelling with induration and 2 adjacent areas of pustules of the right upper lip. Area is tender. No bleeding or drainage.  Neck: Normal range of motion. Neck supple.  Cardiovascular:  Normal rate and regular rhythm.   Pulmonary/Chest: Effort normal and breath sounds normal.  Neurological: He is alert and oriented to person, place, and time.  Skin: Skin is warm.  Psychiatric: He has a normal mood and affect.    ED Course  Procedures  COORDINATION OF CARE:  Nursing notes reviewed. Vital signs reviewed. Initial pt interview and examination performed.   3:43 AM-patient seen and evaluated. Patient with 1-2 cm area of induration and pustule to his right upper lip. At this time given the size and location will withhold I&D. Patient instructed to use frequent warm compresses. He agrees with plan. Strict return precautions given.       MDM   1. Abscess of lip        Angus Sellereter S Addalyn Speedy, PA-C 10/13/13 [phone]

## 2013-10-14 NOTE — ED Provider Notes (Signed)
Medical screening examination/treatment/procedure(s) were performed by non-physician practitioner and as supervising physician I was immediately available for consultation/collaboration.    Haygen Zebrowski M Tyshan Enderle, MD 10/14/13 2124 

## 2014-01-20 ENCOUNTER — Encounter (HOSPITAL_COMMUNITY): Payer: Self-pay | Admitting: Emergency Medicine

## 2014-01-20 ENCOUNTER — Emergency Department (HOSPITAL_COMMUNITY)
Admission: EM | Admit: 2014-01-20 | Discharge: 2014-01-20 | Disposition: A | Discharge status: Home / Self Care | Payer: Self-pay | Attending: Emergency Medicine | Admitting: Emergency Medicine

## 2014-01-20 DIAGNOSIS — Z79899 Other long term (current) drug therapy: Secondary | ICD-10-CM | POA: Insufficient documentation

## 2014-01-20 DIAGNOSIS — F17200 Nicotine dependence, unspecified, uncomplicated: Secondary | ICD-10-CM | POA: Insufficient documentation

## 2014-01-20 DIAGNOSIS — B86 Scabies: Secondary | ICD-10-CM | POA: Insufficient documentation

## 2014-01-20 DIAGNOSIS — Z8719 Personal history of other diseases of the digestive system: Secondary | ICD-10-CM | POA: Insufficient documentation

## 2014-01-20 DIAGNOSIS — Z760 Encounter for issue of repeat prescription: Secondary | ICD-10-CM | POA: Insufficient documentation

## 2014-01-20 DIAGNOSIS — R21 Rash and other nonspecific skin eruption: Secondary | ICD-10-CM

## 2014-01-20 MED ORDER — PERMETHRIN 5 % EX CREA: TOPICAL_CREAM | CUTANEOUS | Status: DC

## 2014-01-20 MED ORDER — SERTRALINE HCL 100 MG PO TABS: 100.0000 mg | ORAL_TABLET | Freq: Every day | ORAL | Status: DC

## 2014-01-20 MED ORDER — ALPRAZOLAM 1 MG PO TABS: 1.0000 mg | ORAL_TABLET | Freq: Three times a day (TID) | ORAL | Status: DC | PRN

## 2014-01-20 NOTE — ED Notes (Addendum)
Pt from home c/o  Red bump rash that is on his back and arms that is itchy. He also wants a refill on his Zoloft and Xanax. He is unable to afford his Psychiatry doctors visit. Pt denies SI or HI.

## 2014-01-20 NOTE — ED Provider Notes (Addendum)
CSN: [id number]     Arrival date & time 01/20/14  1124 History   First MD Initiated Contact with Patient 01/20/14 1239     Chief Complaint  Patient presents with  . Rash  . Medication Refill     (Consider location/radiation/quality/duration/timing/severity/associated sxs/prior Treatment) Patient is a 33 y.o. male presenting with rash. The history is provided by the patient.  Rash Associated symptoms: no diarrhea, no fever, no headaches, no myalgias, no shortness of breath, no sore throat and not vomiting   pt c/o sparse, pruritic lesions/rash to dorsum bil hands, esp in web spaces between digits, and few areas to back/trunk for the past 1-2 weeks. Denies any medication/new med use during this period.  Did change soaps/detergents, but states changed after symptoms had started. No new foods. No hx same symptoms in past. Denies any recent uri symptoms. No recent febrile illness. No lesions in mouth or on palms or soles. Does not feel sick or ill. Also states just ran out of xanax and zoloft which he has taken for 5 yrs, requests refill until he can see his provider in coming week.      Past Medical History  Diagnosis Date  . Substance abuse   . [facility] caries     [facility]   Past Surgical History  Procedure Laterality Date  . Mcl    . Lumbar fusion     No family history on file. History  Substance Use Topics  . Smoking status: Current Every Day Smoker    Types: Cigarettes  . Smokeless tobacco: Not on file  . Alcohol Use: No    Review of Systems  Constitutional: Negative for fever and chills.  HENT: Negative for congestion and sore throat.   Eyes: Negative for redness.  Respiratory: Negative for cough and shortness of breath.   Gastrointestinal: Negative for vomiting and diarrhea.  Genitourinary: Negative for dysuria.  Musculoskeletal: Negative for myalgias.  Skin: Positive for rash.  Neurological: Negative for headaches.      Allergies  Augmentin  Home  Medications   Prior to Admission medications   Medication Sig Start Date End Date Taking? Authorizing Provider  acetaminophen (TYLENOL) 500 MG tablet Take 500 mg by mouth every 6 (six) hours as needed (pain).    Historical Provider, MD  ALPRAZolam Prudy Feeler(XANAX) 1 MG tablet Take 1 mg by mouth 2 (two) times daily.    Historical Provider, MD  ALPRAZolam Prudy Feeler(XANAX) 1 MG tablet Take 1 tablet (1 mg total) by mouth at bedtime as needed for anxiety. 09/04/13   Irish EldersKelly Walker, NP  esomeprazole (NEXIUM) 40 MG capsule Take 40 mg by mouth daily before breakfast. OTC    Historical Provider, MD  sertraline (ZOLOFT) 100 MG tablet Take 100 mg by mouth daily.    Historical Provider, MD   BP 114/74  Pulse 97  Temp(Src) 98.1 F (36.7 C) (Oral)  Resp 16  SpO2 100% Physical Exam  Nursing note and vitals reviewed. Constitutional: He is oriented to person, place, and time. He appears well-developed and well-nourished. No distress.  HENT:  Nose: Nose normal.  Mouth/Throat: Oropharynx is clear and moist.  No mucous membrane lesions  Eyes: Conjunctivae are normal. No scleral icterus.  Neck: Neck supple. No tracheal deviation present.  Cardiovascular: Normal rate, regular rhythm, normal heart sounds and intact distal pulses.   Pulmonary/Chest: Effort normal and breath sounds normal. No accessory muscle usage. No respiratory distress.  Abdominal: He exhibits no distension. There is no tenderness.  Musculoskeletal: Normal range of motion. He exhibits no edema and no tenderness.  Lymphadenopathy:    He has no cervical adenopathy.  Neurological: He is alert and oriented to person, place, and time.  Steady gait  Skin: Skin is warm. He is not diaphoretic.  Sparse lesions to dorsum bil hands, esp web space between digits, and few lesions to trunk. Appear c/w scabies. No urticaria/hives. No palms or soles. No mucous membrane lesions.   Psychiatric: He has a normal mood and affect.    ED Course  Procedures (including  critical care time)   MDM  Pt requests refill of his xanax and zoloft x 1 week until he can see his provider. Verified dose w pt.  Pt states on x 5 yrs and tolerates v well.  Denies severe anxiety or recent depression.   Areas of rash appear c/w scabies.  Will give rx.  Reviewed nursing notes and prior charts for additional history.   Discussed need pcp follow up.       Suzi RootsKevin E Whitlee Sluder, MD 01/20/14 [phone]

## 2014-01-20 NOTE — Discharge Instructions (Signed)
Use permethrin cream as directed. Wash all clothing and bed linens.  Avoid any perfumed soaps or detergents.  Try benadryl as need if itching. Take your medications as prescribed, and follow up with your doctor should you need additional refill or prescription. The xanax will cause drowsiness - no driving when taking xanax.  Follow up with your doctor in coming week. Return to ER if worse, other concern.   Scabies Scabies are small bugs (mites) that burrow under the skin and cause red bumps and severe itching. These bugs can only be seen with a microscope. Scabies are highly contagious. They can spread easily from person to person by direct contact. They are also spread through sharing clothing or linens that have the scabies mites living in them. It is not unusual for an entire family to become infected through shared towels, clothing, or bedding.  HOME CARE INSTRUCTIONS   Your caregiver may prescribe a cream or lotion to kill the mites. If cream is prescribed, massage the cream into the entire body from the neck to the bottom of both feet. Also massage the cream into the scalp and face if your child is less than 33 year old. Avoid the eyes and mouth. Do not wash your hands after application.  Leave the cream on for 8 to 12 hours. Your child should bathe or shower after the 8 to 12 hour application period. Sometimes it is helpful to apply the cream to your child right before bedtime.  One treatment is usually effective and will eliminate approximately 95% of infestations. For severe cases, your caregiver may decide to repeat the treatment in 1 week. Everyone in your household should be treated with one application of the cream.  New rashes or burrows should not appear within 24 to 48 hours after successful treatment. However, the itching and rash may last for 2 to 4 weeks after successful treatment. Your caregiver may prescribe a medicine to help with the itching or to help the rash go away more  quickly.  Scabies can live on clothing or linens for up to 3 days. All of your child's recently used clothing, towels, stuffed toys, and bed linens should be washed in hot water and then dried in a dryer for at least 20 minutes on high heat. Items that cannot be washed should be enclosed in a plastic bag for at least 3 days.  To help relieve itching, bathe your child in a cool bath or apply cool washcloths to the affected areas.  Your child may return to school after treatment with the prescribed cream. SEEK MEDICAL CARE IF:   The itching persists longer than 4 weeks after treatment.  The rash spreads or becomes infected. Signs of infection include red blisters or yellow-tan crust. Document Released: 09/06/2005 Document Revised: 11/29/2011 Document Reviewed: 01/15/2009 [facility] Patient Information 2014 [place], [place].    Medication Refill, Emergency Department We have refilled your medication today as a courtesy to you. It is best for your medical care, however, to take care of getting refills done through your primary caregiver's office. They have your records and can do a better job of follow-up than we can in the emergency department. On maintenance medications, we often only prescribe enough medications to get you by until you are able to see your regular caregiver. This is a more expensive way to refill medications. In the future, please plan for refills so that you will not have to use the emergency department for this. Thank you for your  help. Your help allows us to better take care of the daily emergencies that enter our department. Document Released: 12/24/2003 Document Revised: 11/29/2011 Document Reviewed: 09/06/2005 [facility] Patient Information 2014 [place], [place].

## 2014-02-01 ENCOUNTER — Emergency Department (HOSPITAL_COMMUNITY)
Admission: EM | Admit: 2014-02-01 | Discharge: 2014-02-01 | Disposition: A | Discharge status: Home / Self Care | Payer: Self-pay | Attending: Emergency Medicine | Admitting: Emergency Medicine

## 2014-02-01 ENCOUNTER — Encounter (HOSPITAL_COMMUNITY): Payer: Self-pay | Admitting: Emergency Medicine

## 2014-02-01 DIAGNOSIS — F411 Generalized anxiety disorder: Secondary | ICD-10-CM | POA: Insufficient documentation

## 2014-02-01 DIAGNOSIS — Z79899 Other long term (current) drug therapy: Secondary | ICD-10-CM | POA: Insufficient documentation

## 2014-02-01 DIAGNOSIS — Z760 Encounter for issue of repeat prescription: Secondary | ICD-10-CM | POA: Insufficient documentation

## 2014-02-01 DIAGNOSIS — L539 Erythematous condition, unspecified: Secondary | ICD-10-CM | POA: Insufficient documentation

## 2014-02-01 DIAGNOSIS — L738 Other specified follicular disorders: Secondary | ICD-10-CM | POA: Insufficient documentation

## 2014-02-01 DIAGNOSIS — Z8719 Personal history of other diseases of the digestive system: Secondary | ICD-10-CM | POA: Insufficient documentation

## 2014-02-01 DIAGNOSIS — Z792 Long term (current) use of antibiotics: Secondary | ICD-10-CM | POA: Insufficient documentation

## 2014-02-01 DIAGNOSIS — L739 Follicular disorder, unspecified: Secondary | ICD-10-CM

## 2014-02-01 MED ORDER — SULFAMETHOXAZOLE-TRIMETHOPRIM 800-160 MG PO TABS: 1.0000 | ORAL_TABLET | Freq: Two times a day (BID) | ORAL | Status: DC

## 2014-02-01 MED ORDER — ALPRAZOLAM 1 MG PO TABS: 1.0000 mg | ORAL_TABLET | Freq: Three times a day (TID) | ORAL | Status: DC | PRN

## 2014-02-01 NOTE — ED Notes (Signed)
MD bedside

## 2014-02-01 NOTE — ED Notes (Signed)
Pt alert, nad, resp even unlabored, skin pwd, denies needs, ambulates to discharge 

## 2014-02-01 NOTE — ED Notes (Signed)
Social Worker @ bedside.

## 2014-02-01 NOTE — ED Notes (Signed)
Pt alert, arrives from home, c/o wounds to coccyx, onset a few days ago, resp even unlabored, skin pwd, pt also request psych medication refill

## 2014-02-01 NOTE — Discharge Instructions (Signed)
Folliculitis  °Folliculitis is redness, soreness, and swelling (inflammation) of the hair follicles. This condition can occur anywhere on the body. People with weakened immune systems, diabetes, or obesity have a greater risk of getting folliculitis. °CAUSES °· Bacterial infection. This is the most common cause. °· Fungal infection. °· Viral infection. °· Contact with certain chemicals, especially oils and tars. °Long-term folliculitis can result from bacteria that live in the nostrils. The bacteria may trigger multiple outbreaks of folliculitis over time. °SYMPTOMS °Folliculitis most commonly occurs on the scalp, thighs, legs, back, buttocks, and areas where hair is shaved frequently. An early sign of folliculitis is a small, white or yellow, pus-filled, itchy lesion (pustule). These lesions appear on a red, inflamed follicle. They are usually less than 0.2 inches (5 mm) wide. When there is an infection of the follicle that goes deeper, it becomes a boil or furuncle. A group of closely packed boils creates a larger lesion (carbuncle). Carbuncles tend to occur in hairy, sweaty areas of the body. °DIAGNOSIS  °Your caregiver can usually tell what is wrong by doing a physical exam. A sample may be taken from one of the lesions and tested in a lab. This can help determine what is causing your folliculitis. °TREATMENT  °Treatment may include: °· Applying warm compresses to the affected areas. °· Taking antibiotic medicines orally or applying them to the skin. °· Draining the lesions if they contain a large amount of pus or fluid. °· Laser hair removal for cases of long-lasting folliculitis. This helps to prevent regrowth of the hair. °HOME CARE INSTRUCTIONS °· Apply warm compresses to the affected areas as directed by your caregiver. °· If antibiotics are prescribed, take them as directed. Finish them even if you start to feel better. °· You may take over-the-counter medicines to relieve itching. °· Do not shave  irritated skin. °· Follow up with your caregiver as directed. °SEEK IMMEDIATE MEDICAL CARE IF:  °· You have increasing redness, swelling, or pain in the affected area. °· You have a fever. °MAKE SURE YOU: °· Understand these instructions. °· Will watch your condition. °· Will get help right away if you are not doing well or get worse. °Document Released: 11/15/2001 Document Revised: 03/07/2012 Document Reviewed: 12/07/2011 °ExitCare® Patient Information ©2014 [employer]. ° ° ° °Emergency Department Resource Guide °1) Find a Doctor and Pay Out of Pocket °Although you won't have to find out who is covered by your insurance plan, it is a good idea to ask around and get recommendations. You will then need to call the office and see if the doctor you have chosen will accept you as a new patient and what types of options they offer for patients who are self-pay. Some doctors offer discounts or will set up payment plans for their patients who do not have insurance, but you will need to ask so you aren't surprised when you get to your appointment. ° °2) Contact Your Local Health Department °Not all health departments have doctors that can see patients for sick visits, but many do, so it is worth a call to see if yours does. If you don't know where your local health department is, you can check in your phone book. The CDC also has a tool to help you locate your state's health department, and many state websites also have listings of all of their local health departments. ° °3) Find a Walk-in Clinic °If your illness is not likely to be very severe or complicated, you may want   to try a walk in clinic. These are popping up all over the country in pharmacies, drugstores, and shopping centers. They're usually staffed by nurse practitioners or physician assistants that have been trained to treat common illnesses and complaints. They're usually fairly quick and inexpensive. However, if you have serious medical issues or chronic  medical problems, these are probably not your best option. ° °No Primary Care Doctor: °- Call [facility] at  832-8000 - they can help you locate a primary care doctor that  accepts your insurance, provides certain services, etc. °- Physician Referral Service- 1-800-533-3463 ° °Chronic Pain Problems: °Organization         Address  Phone   Notes  ° [facility]  (336) 297-2271 Patients need to be referred by their primary care doctor.  ° °Medication Assistance: °Organization         Address  Phone   Notes  °[place] [facility] [address] °[place], [place] [address] (336) 641-8030 --Must be a resident of [place] °-- Must have NO insurance coverage whatsoever (no Medicaid/ Medicare, etc.) °-- The pt. MUST have a primary care doctor that directs their care regularly and follows them in the community °  °[facility]  (866) 331-1348   °[employer]  (888) 892-1162   ° °Agencies that provide inexpensive medical care: °Organization         Address  Phone   Notes  °[facility]  (336) 832-8035   °[facility]    (336) 832-7272   °[facility] [address] °[place], [place] [address] (336) 832-4777   °[facility] [address], °[place] (336) 271-4999   °[facility]    (336) 373-0678   °[facility]    (336) 272-1050   °[facility] ° [address], [place] Phone:  (336) 832-4444, Fax:  (336) 832-4440 Hours of Operation:  9 am - 6 pm, M-F.  Also accepts Medicaid/Medicare and self-pay.  °[facility] ° [address], [place] Phone: (336) 832-3150, Fax: (336) 832-3151. Hours of Operation:  8:30 am - 5:30 pm, M-F.  Also accepts Medicaid and self-pay.  °[facility] [place] [address], [place] Phone: (336) 878-6027   °[facility] [address], [place], [place] (336)723-1848,  Ext. 123 Mondays & Thursdays: 7-9 AM.  First 15 patients are seen on a first come, first serve basis. °  ° °Medicaid-accepting [place] Providers: ° °Organization         Address  Phone   Notes  °[facility] [address], [place] (336) 641-2100 Also accepts self-pay patients.  °[facility] [address], [place] ° (336) 856-9996   °[facility] [address], [place] (336) 288-8857   °[facility] [address] [place] [address], [place] (336) 299-7000   °Veita Bland [address], [place]  ° (336) 373-1557 Only accepts  Access Medicaid patients after they have their name applied to their card.  ° °Self-Pay (no insurance) in [place]: ° °Organization         Address  Phone   Notes  °Sickle Cell Patients, [facility] [address], [place] (336) 832-1970   °[facility] [address],  (336) 832-4400   °  [facility] ° [address], [place] (336) 992-4800   °[facility]/Dr. Osei-Bonsu ° [address] [place] [address], [place] or [address], [place] (336) 841-8500 Phone number for both [place] and [place] locations is the same.  °[facility] [address], [place] (336) 299-0000   °[facility] [address] [place] [address], [place] or [address] (336) 852-7530 °(336) 878-2260   °[facility] [address], [place] (336) 350-1642, phone; (336) 294-5005, fax Sees patients 1st and 3rd Saturday of every month.  Must not qualify for public or private insurance (i.e. Medicaid, Medicare, [place] Health Choice, Veterans' Benefits) • Household income should be no more than 200% of the poverty level •The clinic cannot treat you if you are pregnant or think you are pregnant • Sexually transmitted diseases are not  treated at the clinic.  ° ° °Dental Care: °Organization         Address  Phone  Notes  °[facility] [address], [place] (336) 641-6152 Accepts children up to age 21 who are enrolled in Medicaid or [place] Health Choice; pregnant women with a Medicaid card; and children who have applied for Medicaid or [place] Health Choice, but were declined, whose parents can pay a reduced fee at time of service.  °[facility] [place]  [address], [place] (336) 641-7733 Accepts children up to age 21 who are enrolled in Medicaid or [place] Health Choice; pregnant women with a Medicaid card; and children who have applied for Medicaid or [place] Health Choice, but were declined, whose parents can pay a reduced fee at time of service.  °[facility] ° [address], [place] (336) 641-4533 Patients are seen by appointment only. Walk-ins are not accepted. [facility] will see patients 18 years of age and older. °Monday - Tuesday (8am-5pm) °Most Wednesdays (8:30-5pm) °$30 per visit, cash only  °[facility] ° [address], [place] (336) 641-4533 Patients are seen by appointment only. Walk-ins are not accepted. [facility] will see patients 18 years of age and older. °One Wednesday Evening (Monthly: Volunteer Based).  $30 per visit, cash only  °[facility]  (919) 537-3737 for adults; Children under age 4, call Graduate Pediatric Dentistry at (919) [phone]. Children aged 4-14, please call (919) [phone] to request a pediatric application. ° Dental services are provided in all areas of dental care including fillings, crowns and bridges, complete and partial dentures, implants, gum treatment, root canals, and extractions. Preventive care is also provided. Treatment is provided to both adults and children. °Patients are selected via a lottery and there is  often a waiting list. °  °[facility] [address], °[place] ° (336) 763-8833 [email or web address] °  °[facility] [address], [place], [place] (336)723-1848, Ext. 123 Second and Fourth Thursday of each month, opens at 6:30 AM; Clinic ends at 9 AM.  Patients are seen on a first-come first-served basis, and a limited number are seen during each clinic.  ° °[facility] ° [address], [place], [place] (336) 723-7904   Eligibility Requirements °You must have lived in [place], [place], or [place] counties for at least the last three months. °  You cannot be eligible for state or federal sponsored   healthcare insurance, including Veterans Administration, Medicaid, or Medicare. °  You generally cannot be eligible for healthcare insurance through your employer.  °  How to apply: °Eligibility screenings are held every Tuesday and Wednesday afternoon from 1:00 pm until 4:00 pm. You do not need an appointment for the interview!  °[facility] [address], [place], [place] 336-631-2330   °[facility] [facility]  336-342-8273   °[facility]  336-703-3100   °[facility]  336-570-6415   ° °Behavioral Health Resources in the Community: °Intensive Outpatient Programs °Organization         Address  Phone  Notes  °[facility] [address], [place], [place] 336-878-6098   °[facility] [address], [place], [place] 336-832-9800   °[facility] [address], [place], [place] ° 336-882-2125   °[facility] [address],  °[place], [place] 1-800-853-5163 or 336-641-4981   °Substance Abuse Resources °Organization         Address  Phone  Notes  °[facility]  336-882-2125   °[facility]  336-784-9470   °[facility]  336-285-9073   °[facility]  336-845-3988   °Residential & Outpatient Substance  Abuse Program  1-800-659-3381   °Psychological Services °Organization         Address  Phone  Notes  °[facility]  336- 832-9600   °[facility]  336- 378-7881   °[facility] [address], [place] 1-800-853-5163 or 336-641-4981   ° °Mobile Crisis Teams °Organization         Address  Phone  Notes  °[facility], Mobile Crisis Care Unit  1-877-626-1772   °Assertive °[facility] ° [address] [place], [place] 336-834-9664   °Sharon DeEsch [address] °[place] 336-554-5454   ° °Self-Help/Support Groups °Organization         Address  Phone             Notes  °[facility] [place] [facility] - variety of support groups  336- 373-1402 Call for more information  °Narcotics Anonymous (NA), [facility] [address], °[place]  2 meetings at this location  ° °Residential Treatment Programs °Organization         Address  Phone  Notes  °[facility] [address],    °[place]  1-866-801-8205   °[facility] ° [address], [place], [place] 704-293-8524   °[facility] [address], [place] 336-845-3988 Admissions: 8am-3pm M-F  °[facility] [address]    °[place], [place] 336-841-1104   °[facility] [address], [place], [place] 336-379-7146   °[facility] [address]  °[place], [place] 336-285-9073   °[facility] - Intensive Outpatient [address], [place], [place] 336-852-3033   °[facility] [address]  °[place], [place] 1-877-615-2722 or 336-784-9470   °[facility] [address] [place], [place] 336-227-7417 Accepts Medicaid  °[facility] [address]  °  1-800-659-3381 Substance Abuse/Addiction Treatment  ° °[place] Behavioral Health Resources °Organization          Address  Phone  Notes  °[facility]  (888) 581-9988   °Julie Brannon, PhD [address] [place],    (336) 349-5553   or (336) 951-0000   °[facility]   [address] °[place], [place] (336) 349-4454   °[facility] [address], [place], [place] (336) 342-8316 Insurance/Medicaid/sponsorship through [facility]  °[facility] [address]                                    [place], [place] (336) 342-8316 Therapy/tele-psych/case  °[facility] [address]  ° [place],  (336) 349-2233    °Dr. Arfeen  (336) 349-4544   °[facility] [place]  [employer] [place] [facility] 1) [address], [place] °2) [address], [place] °3)  [address], [place] (336) 349-3220 °(336) 342-7768 ° °(336) 342-8140   °[place] [facility] (336) 342-1394 or (336) 342-3537 (After Hours)    ° ° ° ° °

## 2014-02-01 NOTE — ED Provider Notes (Signed)
CSN: [id number]     Arrival date & time 02/01/14  0806 History   First MD Initiated Contact with Patient 02/01/14 [phone]     Chief Complaint  Patient presents with  . Recurrent Skin Infections  . Medication Refill     (Consider location/radiation/quality/duration/timing/severity/associated sxs/prior Treatment) HPI 33 year old male presents with multiple "boils" on his buttocks as well as on his right lower extremity. Patient states his been on for the last 2 days. Denies a fevers or chills. He states is a recurrent issue for him. Typically he is able to just have the boils drain. Has never seen anyone for this. There several small boils on his buttocks and one large one. He also is requesting a refill of his anxiety medicine. He states that he was given 20 tablets of Xanax 2 weeks ago here in the ED. His psychiatrist cost to much to see me to have the money for right now he is to be [facility] at the end of the month per the patient. States he's been taking Xanax for several years. His last Xanax was yesterday.  Past Medical History  Diagnosis Date  . Substance abuse   . Dental caries     [facility]   Past Surgical History  Procedure Laterality Date  . Mcl    . Lumbar fusion     No family history on file. History  Substance Use Topics  . Smoking status: Current Every Day Smoker    Types: Cigarettes  . Smokeless tobacco: Not on file  . Alcohol Use: No    Review of Systems  Constitutional: Negative for fever.  Skin: Positive for color change. Negative for wound.       boils  All other systems reviewed and are negative.     Allergies  Augmentin  Home Medications   Prior to Admission medications   Medication Sig Start Date End Date Taking? Authorizing Provider  ALPRAZolam Prudy Feeler(XANAX) 1 MG tablet Take 1 mg by mouth 3 (three) times daily.    Yes Historical Provider, MD  ibuprofen (ADVIL,MOTRIN) 200 MG tablet Take 600 mg by mouth every 6 (six) hours as  needed for moderate pain.   Yes Historical Provider, MD  neomycin-bacitracin-polymyxin (NEOSPORIN) ointment Apply 1 application topically every 12 (twelve) hours. apply to eye   Yes Historical Provider, MD  sertraline (ZOLOFT) 100 MG tablet Take 100 mg by mouth daily.   Yes Historical Provider, MD   BP 121/77  Pulse 78  Temp(Src) 98 F (36.7 C) (Oral)  Resp 16  Wt 180 lb (81.647 kg)  SpO2 99% Physical Exam  Nursing note and vitals reviewed. Constitutional: He is oriented to person, place, and time. He appears well-developed and well-nourished.  HENT:  Head: Normocephalic and atraumatic.  Right Ear: External ear normal.  Left Ear: External ear normal.  Nose: Nose normal.  Eyes: Right eye exhibits no discharge. Left eye exhibits no discharge.  Cardiovascular: Normal rate, regular rhythm, normal heart sounds and intact distal pulses.   Pulmonary/Chest: Effort normal.  Abdominal: He exhibits no distension.  Neurological: He is alert and oriented to person, place, and time.  Skin: Skin is warm and dry. There is erythema.       ED Course  Procedures (including critical care time) Labs Review Labs Reviewed - No data to display  Imaging Review No results found.   EKG Interpretation None      MDM   Final diagnoses:  Folliculitis  Patient with what appears to be folliculitis to RLE posterior calf. Has small areas of induration of ~1cm each as well as a larger one of about 2-3 cm that is 2-3 cm lateral to his anus. Given the size, erythema and induration of this, an I&D was attempted after verbal consent by patient. However, no fluid was able to be obtained. Thus this is more likely superficial skin infection vs folliculitis. Favor the later given several small areas. Will treat with sitz baths, hygeine and bactrim. Given his chronic benzo use will give 20 ativan tablets and recommend ASAP f/u with primary care or his psychiatrist.    Audree CamelScott T Carlisha Wisler, MD 02/01/14 [phone]

## 2014-03-28 ENCOUNTER — Encounter (HOSPITAL_COMMUNITY): Payer: Self-pay | Admitting: Emergency Medicine

## 2014-03-28 ENCOUNTER — Emergency Department (HOSPITAL_COMMUNITY)
Admission: EM | Admit: 2014-03-28 | Discharge: 2014-03-28 | Disposition: A | Discharge status: Home / Self Care | Payer: No Typology Code available for payment source | Attending: Emergency Medicine | Admitting: Emergency Medicine

## 2014-03-28 DIAGNOSIS — F411 Generalized anxiety disorder: Secondary | ICD-10-CM | POA: Insufficient documentation

## 2014-03-28 DIAGNOSIS — Z79899 Other long term (current) drug therapy: Secondary | ICD-10-CM | POA: Insufficient documentation

## 2014-03-28 DIAGNOSIS — R21 Rash and other nonspecific skin eruption: Secondary | ICD-10-CM | POA: Insufficient documentation

## 2014-03-28 DIAGNOSIS — Z8719 Personal history of other diseases of the digestive system: Secondary | ICD-10-CM | POA: Insufficient documentation

## 2014-03-28 DIAGNOSIS — Z87828 Personal history of other (healed) physical injury and trauma: Secondary | ICD-10-CM | POA: Insufficient documentation

## 2014-03-28 DIAGNOSIS — Z760 Encounter for issue of repeat prescription: Secondary | ICD-10-CM | POA: Insufficient documentation

## 2014-03-28 DIAGNOSIS — F17200 Nicotine dependence, unspecified, uncomplicated: Secondary | ICD-10-CM | POA: Insufficient documentation

## 2014-03-28 MED ORDER — ALPRAZOLAM 1 MG PO TABS: 1.0000 mg | ORAL_TABLET | Freq: Every evening | ORAL | Status: DC | PRN

## 2014-03-28 NOTE — ED Provider Notes (Signed)
CSN: [id number]     Arrival date & time 03/28/14  1455 History  This chart was scribed for non-physician practitioner, Jillyn Ledger, PA-C, working with Merrie Roof, *, by Bronson Curb, ED Scribe. This patient was seen in room WTR8/WTR8 and the patient's care was started at 6:28 PM.    Chief Complaint  Patient presents with  . Medication Refill  . Rash    The history is provided by the patient. No language interpreter was used.   HPI Comments: Trevor [name] is a 33 y.o. male with history of substance abuse who presents to the Emergency Department for medication refill (Xanax). He was seen here May 3rd and had Xanax refilled. Patient was seen here again on May 15th where his Xanax was refilled for a second time. Patient states he has been out of Xanax for a week. Patient states his mom has cancer and he has been under a lot of stress recently. He denies SI/HI. No chest pain, palpitations or SOB. He has an appointment at the end of the month with [facility].   Patient also complains of a rash to his back and right shoulder onset 3 weeks ago. Patient states the rash starts as bumps and turns into patches. There is associated itching and redness. No open wounds or drainage. Patient has was seen here May 3rd and treated for scabies. States this rash is different. Patient has tried hydrocortisone cream with no significant improvement. Patient denies fever, chills, sore throat, abdominal pain, nausea, emesis, or diarrhea. No one else in close contact with a similar rash.    Past Medical History  Diagnosis Date  . Substance abuse   . Dental caries     lost all teeth in [facility]   Past Surgical History  Procedure Laterality Date  . Mcl    . Lumbar fusion     No family history on file. History  Substance Use Topics  . Smoking status: Current Every Day Smoker    Types: Cigarettes  . Smokeless tobacco: Not on file  . Alcohol Use: No    Review of Systems  Constitutional: Negative  for fever, chills, activity change, appetite change and fatigue.  HENT: Negative for congestion, mouth sores and sore throat.   Respiratory: Negative for cough.   Cardiovascular: Negative for chest pain and palpitations.  Gastrointestinal: Negative for nausea, vomiting, abdominal pain and diarrhea.  Musculoskeletal: Negative for back pain, gait problem, myalgias and neck pain.  Skin: Positive for rash. Negative for color change and wound.  Neurological: Negative for dizziness, weakness, light-headedness and headaches.  Psychiatric/Behavioral: Negative for suicidal ideas, behavioral problems, confusion, dysphoric mood and agitation. The patient is nervous/anxious.   All other systems reviewed and are negative.   Allergies  Augmentin  Home Medications   Prior to Admission medications   Medication Sig Start Date End Date Taking? Authorizing Provider  ALPRAZolam Prudy Feeler) 1 MG tablet Take 1 tablet (1 mg total) by mouth 3 (three) times daily as needed for anxiety. 02/01/14  Yes Audree Camel, MD  ibuprofen (ADVIL,MOTRIN) 200 MG tablet Take 600 mg by mouth every 6 (six) hours as needed for moderate pain.   Yes Historical Provider, MD  sertraline (ZOLOFT) 100 MG tablet Take 100 mg by mouth daily.   Yes Historical Provider, MD   Triage Vitals: BP 133/83  Pulse 70  Temp(Src) 98 F (36.7 C) (Oral)  Resp 18  SpO2 99%  Filed Vitals:   03/28/14 1502  BP: 133/83  Pulse: 70  Temp: 98 F (36.7 C)  TempSrc: Oral  Resp: 18  SpO2: 99%    Physical Exam  Nursing note and vitals reviewed. Constitutional: He is oriented to person, place, and time. He appears well-developed and well-nourished. No distress.  HENT:  Head: Normocephalic and atraumatic.  Right Ear: External ear normal.  Left Ear: External ear normal.  Mouth/Throat: Oropharynx is clear and moist. No oropharyngeal exudate.  No oral sores or lesions. No oral or tongue edema  Eyes: Conjunctivae and EOM are normal. Right eye  exhibits no discharge. Left eye exhibits no discharge.  Neck: Normal range of motion. Neck supple. No tracheal deviation present.  Cardiovascular: Normal rate, regular rhythm and normal heart sounds.  Exam reveals no gallop and no friction rub.   No murmur heard. Pulmonary/Chest: Effort normal and breath sounds normal. No respiratory distress. He has no wheezes. He has no rales. He exhibits no tenderness.  Abdominal: Soft. He exhibits no distension. There is no tenderness.  Musculoskeletal: Normal range of motion. He exhibits no edema and no tenderness.  Neurological: He is alert and oriented to person, place, and time.  Skin: Skin is warm and dry. Rash noted. He is not diaphoretic.  3-4 oval rough erythematous 3 cm patches with dry flaking skin to the abdomen and back. No edema, fluctuance, induration, open wounds, or tenderness. No blistering or mucosal involvement. No rash to palms or soles. No petechiae or purpura.   Psychiatric: He has a normal mood and affect. His behavior is normal.    ED Course  Procedures (including critical care time)  DIAGNOSTIC STUDIES: Oxygen Saturation is 99% on room air, normal by my interpretation.    COORDINATION OF CARE: At 1841 Discussed treatment plan with patient which includes Xanax and OTC Benadryl and hydrocortisone cream. Patient agrees.   Labs Review Labs Reviewed - No data to display  Imaging Review No results found.   EKG Interpretation None      MDM   Trevor [name] is a 33 y.o. male with history of substance abuse who presents to the Emergency Department for medication refill (Xanax). Xanax refilled. [facility]. Patient also has a rash which is likely due a dermatitis. Rash is benign. Patient afebrile and non-toxic in appearance. No evidence of an abscess, cellulitis, or other concerning infectious process. Instructed to continue to use hydrocortisone cream and benadryl for itching. Return  precautions, discharge instructions, and follow-up was discussed with the patient before discharge.     Discharge Medication List as of 03/28/2014  6:44 PM    START taking these medications   Details  !! ALPRAZolam (XANAX) 1 MG tablet Take 1 tablet (1 mg total) by mouth at bedtime as needed for anxiety., Starting 03/28/2014, Until Discontinued, Print     !! - Potential duplicate medications found. Please discuss with provider.       Final impressions: 1. Encounter for medication refill   2. Rash       Luiz IronJessica Katlin Ananth Fiallos PA-C   I personally performed the services described in this documentation, which was scribed in my presence. The recorded information has been reviewed and is accurate.        Jillyn LedgerJessica K Carin Shipp, PA-C 03/29/14 1459

## 2014-03-28 NOTE — ED Notes (Signed)
Pt states that he wants a refill for 2 mg xanax.  Has been out for a week.  Also wants to get a rash checked out that he has had x 3 wks.

## 2014-03-28 NOTE — Discharge Instructions (Signed)
Apply hydrocortisone cream to the area - over the counter  Keep skin clean and dry  Return to the emergency department if you develop any changing/worsening condition, abscess, fever, or any other concerns (please read additional information regarding your condition below)     Medication Refill, Emergency Department We have refilled your medication today as a courtesy to you. It is best for your medical care, however, to take care of getting refills done through your primary caregiver's office. They have your records and can do a better job of follow-up than we can in the emergency department. On maintenance medications, we often only prescribe enough medications to get you by until you are able to see your regular caregiver. This is a more expensive way to refill medications. In the future, please plan for refills so that you will not have to use the emergency department for this. Thank you for your help. Your help allows us to better take care of the daily emergencies that enter our department. Document Released: 12/24/2003 Document Revised: 11/29/2011 Document Reviewed: 09/06/2005 [facility] Patient Information 2015 [place]. This information is not intended to replace advice given to you by your health care provider. Make sure you discuss any questions you have with your health care provider.  Rash A rash is a change in the color or texture of your skin. There are many different types of rashes. You may have other problems that accompany your rash. CAUSES   Infections.  Allergic reactions. This can include allergies to pets or foods.  Certain medicines.  Exposure to certain chemicals, soaps, or cosmetics.  Heat.  Exposure to poisonous plants.  Tumors, both cancerous and noncancerous. SYMPTOMS   Redness.  Scaly skin.  Itchy skin.  Dry or cracked skin.  Bumps.  Blisters.  Pain. DIAGNOSIS  Your caregiver may do a physical exam to determine what type of rash you  have. A skin sample (biopsy) may be taken and examined under a microscope. TREATMENT  Treatment depends on the type of rash you have. Your caregiver may prescribe certain medicines. For serious conditions, you may need to see a skin doctor (dermatologist). HOME CARE INSTRUCTIONS   Avoid the substance that caused your rash.  Do not scratch your rash. This can cause infection.  You may take cool baths to help stop itching.  Only take over-the-counter or prescription medicines as directed by your caregiver.  Keep all follow-up appointments as directed by your caregiver. SEEK IMMEDIATE MEDICAL CARE IF:  You have increasing pain, swelling, or redness.  You have a fever.  You have new or severe symptoms.  You have body aches, diarrhea, or vomiting.  Your rash is not better after 3 days. MAKE SURE YOU:  Understand these instructions.  Will watch your condition.  Will get help right away if you are not doing well or get worse. Document Released: 08/27/2002 Document Revised: 11/29/2011 Document Reviewed: 06/21/2011 [facility] Patient Information 2015 [place]. This information is not intended to replace advice given to you by your health care provider. Make sure you discuss any questions you have with your health care provider.   Contact Dermatitis Contact dermatitis is a rash that happens when something touches the skin. You touched something that irritates your skin, or you have allergies to something you touched. HOME CARE   Avoid the thing that caused your rash.  Keep your rash away from hot water, soap, sunlight, chemicals, and other things that might bother it.  Do not scratch your rash.  You can take cool baths to help stop itching.  Only take medicine as told by your doctor.  Keep all doctor visits as told. GET HELP RIGHT AWAY IF:   Your rash is not better after 3 days.  Your rash gets worse.  Your rash is puffy (swollen), tender, red, sore, or  warm.  You have problems with your medicine. MAKE SURE YOU:   Understand these instructions.  Will watch your condition.  Will get help right away if you are not doing well or get worse. Document Released: 07/04/2009 Document Revised: 11/29/2011 Document Reviewed: 02/09/2011 [facility] Patient Information 2015 [place], [place]. This information is not intended to replace advice given to you by your health care provider. Make sure you discuss any questions you have with your health care provider.

## 2014-03-31 NOTE — ED Provider Notes (Signed)
Medical screening examination/treatment/procedure(s) were performed by non-physician practitioner and as supervising physician I was immediately available for consultation/collaboration.   Candyce ChurnJohn David Chester Sibert III, MD 03/31/14 2051

## 2014-07-18 ENCOUNTER — Emergency Department (HOSPITAL_COMMUNITY)
Admission: EM | Admit: 2014-07-18 | Discharge: 2014-07-18 | Disposition: A | Discharge status: Home / Self Care | Payer: No Typology Code available for payment source | Attending: Emergency Medicine | Admitting: Emergency Medicine

## 2014-07-18 ENCOUNTER — Encounter (HOSPITAL_COMMUNITY): Payer: Self-pay | Admitting: Emergency Medicine

## 2014-07-18 DIAGNOSIS — L0231 Cutaneous abscess of buttock: Secondary | ICD-10-CM | POA: Insufficient documentation

## 2014-07-18 DIAGNOSIS — Z720 Tobacco use: Secondary | ICD-10-CM | POA: Insufficient documentation

## 2014-07-18 DIAGNOSIS — L03317 Cellulitis of buttock: Secondary | ICD-10-CM | POA: Insufficient documentation

## 2014-07-18 DIAGNOSIS — Z8719 Personal history of other diseases of the digestive system: Secondary | ICD-10-CM | POA: Insufficient documentation

## 2014-07-18 DIAGNOSIS — Z79899 Other long term (current) drug therapy: Secondary | ICD-10-CM | POA: Insufficient documentation

## 2014-07-18 DIAGNOSIS — L0291 Cutaneous abscess, unspecified: Secondary | ICD-10-CM

## 2014-07-18 DIAGNOSIS — L0390 Cellulitis, unspecified: Secondary | ICD-10-CM

## 2014-07-18 MED ORDER — SULFAMETHOXAZOLE-TRIMETHOPRIM 800-160 MG PO TABS: 1.0000 | ORAL_TABLET | Freq: Two times a day (BID) | ORAL | Status: DC

## 2014-07-18 MED ORDER — LIDOCAINE-EPINEPHRINE (PF) 1 %-1:200000 IJ SOLN
10.0000 mL | Freq: Once | INTRAMUSCULAR | Status: AC
  Administered 2014-07-18: 10 mL

## 2014-07-18 MED ORDER — TRAMADOL HCL 50 MG PO TABS: 50.0000 mg | ORAL_TABLET | Freq: Four times a day (QID) | ORAL | Status: DC | PRN

## 2014-07-18 NOTE — ED Provider Notes (Signed)
CSN: [id number]     Arrival date & time 07/18/14  1531 History  This chart was scribed for non-physician practitioner working with No att. providers found by Elveria Risingimelie Horne, ED Scribe. This patient was seen in room WTR8/WTR8 and the patient's care was started at 5:04 PM.   Chief Complaint  Patient presents with  . Abscess   The history is provided by the patient. No language interpreter was used.   HPI Comments: Trevor [name] is a 33 y.o. male who presents to the Emergency Department complaining of abscess on his right buttocks present for two days. Patient reports subjective fever, chills and malaise which he states is typical for his recurrent boils. Patient denies pain with bowel movements but reports exacerbated pain with sitting. Pt denies any Hx of seizure disorders.   Past Medical History  Diagnosis Date  . Substance abuse   . [facility] caries     [facility]   Past Surgical History  Procedure Laterality Date  . Mcl    . Lumbar fusion     History reviewed. No pertinent family history. History  Substance Use Topics  . Smoking status: Current Every Day Smoker    Types: Cigarettes  . Smokeless tobacco: Not on file  . Alcohol Use: No    Review of Systems  Constitutional: Positive for chills.  Skin: Positive for wound.       Abscess      Allergies  Augmentin  Home Medications   Prior to Admission medications   Medication Sig Start Date End Date Taking? Authorizing Provider  ALPRAZolam Prudy Feeler(XANAX) 1 MG tablet Take 1 mg by mouth 3 (three) times daily.   Yes Historical Provider, MD  ibuprofen (ADVIL,MOTRIN) 200 MG tablet Take 800 mg by mouth every 6 (six) hours as needed for moderate pain (pain).    Yes Historical Provider, MD  sertraline (ZOLOFT) 100 MG tablet Take 100 mg by mouth daily.   Yes Historical Provider, MD  sulfamethoxazole-trimethoprim (SEPTRA DS) 800-160 MG per tablet Take 1 tablet by mouth 2 (two) times daily. 07/18/14   Melysa Schroyer, PA-C   traMADol (ULTRAM) 50 MG tablet Take 1 tablet (50 mg total) by mouth every 6 (six) hours as needed. 07/18/14   Reniyah Gootee, PA-C   Triage Vitals: BP 105/79  Pulse 95  Temp(Src) 97.4 F (36.3 C) (Oral)  Resp 16  SpO2 97%  Physical Exam  Nursing note and vitals reviewed. Constitutional: He is oriented to person, place, and time. He appears well-developed and well-nourished. No distress.  HENT:  Head: Normocephalic and atraumatic.  Eyes: EOM are normal.  Neck: Neck supple. No tracheal deviation present.  Cardiovascular: Normal rate.   Pulmonary/Chest: Effort normal. No respiratory distress.  Musculoskeletal: Normal range of motion.  Neurological: He is alert and oriented to person, place, and time.  Skin: Skin is warm and dry.  5cm area of cellulitis with fluctuance on the superior right gluteus.   Psychiatric: He has a normal mood and affect. His behavior is normal.   INCISION AND DRAINAGE Performed by:  Wynetta EmeryNicole Genola Yuille, PA Consent: Verbal consent obtained. Risks and benefits: risks, benefits and alternatives were discussed Type: abscess Body area: superior right gluteus  Anesthesia: local infiltration  Incision was made with a scalpel.  Local anesthetic: lidocaine 2% with epinephrine  Anesthetic total: 3 ml  Complexity: complex Blunt dissection to break up loculations  Drainage: purulent  Drainage amount: moderate  Patient tolerance: Patient tolerated the procedure well with no  immediate complications.  ED Course  Procedures (including critical care time)  COORDINATION OF CARE: 5:09 PM- Discussed treatment plan with patient at bedside and patient agreed to plan.   Labs Review Labs Reviewed - No data to display  Imaging Review No results found.   EKG Interpretation None      MDM   Final diagnoses:  Abscess and cellulitis    Filed Vitals:   07/18/14 1543  BP: 105/79  Pulse: 95  Temp: 97.4 F (36.3 C)  TempSrc: Oral  Resp: 16  SpO2:  97%    Medications  lidocaine-EPINEPHrine (XYLOCAINE-EPINEPHrine) 1 %-1:200000 (PF) injection 10 mL (10 mLs Other Given by Other 07/18/14 1714)    Trevor [name] is a 33 y.o. male presenting with gluteal abscess, I and D performed. Patient has mild surrounding cellulitis, will start on Bactrim. Patient requests prescription for Xanax which I have declined.  Evaluation does not show pathology that would require ongoing emergent intervention or inpatient treatment. Pt is hemodynamically stable and mentating appropriately. Discussed findings and plan with patient/guardian, who agrees with care plan. All questions answered. Return precautions discussed and outpatient follow up given.   Discharge Medication List as of 07/18/2014  6:40 PM    START taking these medications   Details  sulfamethoxazole-trimethoprim (SEPTRA DS) 800-160 MG per tablet Take 1 tablet by mouth 2 (two) times daily., Starting 07/18/2014, Until Discontinued, Print    traMADol (ULTRAM) 50 MG tablet Take 1 tablet (50 mg total) by mouth every 6 (six) hours as needed., Starting 07/18/2014, Until Discontinued, Print         I personally performed the services described in this documentation, which was scribed in my presence. The recorded information has been reviewed and is accurate.    Wynetta Emeryicole Devan Babino, PA-C 07/20/14 [phone]

## 2014-07-18 NOTE — Discharge Instructions (Signed)
For pain control you may take:  800mg  of ibuprofen (that is usually 4 over the counter pills)  3 times a day (take with food) and acetaminophen 975mg  (this is 3 over the counter pills) four times a day. Do not drink alcohol or combine with other medications that have acetaminophen as an ingredient (Read the labels!).  For breakthrough pain you may take Tramadol. Do not drink alcohol drive or operate heavy machinery when taking Tramadol.  Return to the emergency room for wound check and packing removal in 48 hours.        If you develop fever, have vomiting or if the swelling and redness starts spreading , return to the emergency room immediately for a recheck.  Do not hesitate to return to the emergency room for any new, worsening or concerning symptoms.  Please obtain primary care using resource guide below. But the minute you were seen in the emergency room and that they will need to obtain records for further outpatient management.   Abscess Care After An abscess (also called a boil or furuncle) is an infected area that contains a collection of pus. Signs and symptoms of an abscess include pain, tenderness, redness, or hardness, or you may feel a moveable soft area under your skin. An abscess can occur anywhere in the body. The infection may spread to surrounding tissues causing cellulitis. A cut (incision) by the surgeon was made over your abscess and the pus was drained out. Gauze may have been packed into the space to provide a drain that will allow the cavity to heal from the inside outwards. The boil may be painful for 5 to 7 days. Most people with a boil do not have high fevers. Your abscess, if seen early, may not have localized, and may not have been lanced. If not, another appointment may be required for this if it does not get better on its own or with medications. HOME CARE INSTRUCTIONS   Only take over-the-counter or prescription medicines for pain, discomfort, or fever as directed by  your caregiver.  When you bathe, soak and then remove gauze or iodoform packs at least daily or as directed by your caregiver. You may then wash the wound gently with mild soapy water. Repack with gauze or do as your caregiver directs. SEEK IMMEDIATE MEDICAL CARE IF:   You develop increased pain, swelling, redness, drainage, or bleeding in the wound site.  You develop signs of generalized infection including muscle aches, chills, fever, or a general ill feeling.  An oral temperature above 102 F (38.9 C) develops, not controlled by medication. See your caregiver for a recheck if you develop any of the symptoms described above. If medications (antibiotics) were prescribed, take them as directed. Document Released: 03/25/2005 Document Revised: 11/29/2011 Document Reviewed: 11/20/2007 [facility] Patient Information 2015 [place], [place]. This information is not intended to replace advice given to you by your health care provider. Make sure you discuss any questions you have with your health care provider.   Emergency Department Resource Guide 1) Find a Doctor and Pay Out of Pocket Although you won't have to find out who is covered by your insurance plan, it is a good idea to ask around and get recommendations. You will then need to call the office and see if the doctor you have chosen will accept you as a new patient and what types of options they offer for patients who are self-pay. Some doctors offer discounts or will set up payment plans for their  patients who do not have insurance, but you will need to ask so you aren't surprised when you get to your appointment.  2) Contact Your Local Health Department Not all health departments have doctors that can see patients for sick visits, but many do, so it is worth a call to see if yours does. If you don't know where your local health department is, you can check in your phone book. The CDC also has a tool to help you locate your state's health  department, and many state websites also have listings of all of their local health departments.  3) Find a Walk-in Clinic If your illness is not likely to be very severe or complicated, you may want to try a walk in clinic. These are popping up all over the country in pharmacies, drugstores, and shopping centers. They're usually staffed by nurse practitioners or physician assistants that have been trained to treat common illnesses and complaints. They're usually fairly quick and inexpensive. However, if you have serious medical issues or chronic medical problems, these are probably not your best option.  No Primary Care Doctor: - Call Health Connect at  [phone] - they can help you locate a primary care doctor that  accepts your insurance, provides certain services, etc. - Physician Referral Service- [phone]  Chronic Pain Problems: Organization         Address  Phone   Notes  [facility]  [phone]) [phone] Patients need to be referred by their primary care doctor.   Medication Assistance: Organization         Address  Phone   Notes  [facility] Medication [facility] [address] [place], [place] [address] [phone] --Must be a resident of [place] -- Must have NO insurance coverage whatsoever (no Medicaid/ Medicare, etc.) -- The pt. MUST have a primary care doctor that directs their care regularly and follows them in the community   MedAssist  [phone]   [employer]  [phone]) [phone]    Agencies that provide inexpensive medical care: Organization         Address  Phone   Notes  [facility]  [phone]) [phone]   [facility]    [phone]) [phone]   [facility] [address] [place], [place] [address] [phone]   [facility] [address], [place] [phone]   Planned Parenthood    [phone]   [facility]    [phone](336) [phone]    [facility]  [address], [place] Phone:  [phone], Fax:  [phone] Hours of Operation:  9 am - 6 pm, M-F.  Also accepts Medicaid/Medicare and self-pay.  [facility]  [address], [place] Phone: [phone](336) [phone], Fax: [phone]) [phone]. Hours of Operation:  8:30 am - 5:30 pm, M-F.  Also accepts Medicaid and self-pay.  [facility] [place] [address], [place] Phone: [phone]) [phone]   [facility] [address], [place], [place] [phone], Ext. [phone] Mondays & Thursdays: 7-9 AM.  First 15 patients are seen on a first come, first serve basis.    Medicaid-accepting [facility] Providers:  Organization         Address  Phone   Notes  [facility] [address], [place] [phone] Also accepts self-pay patients.  Celesta GentileImmanuel  [facility] [address], [place]  [phone]) [phone]   [facility] [address], [place] [phone]) [phone]   [facility] [address] [place] [address], [place] [phone]) [phone]   Renaye RakersVeita Bland [address], [place]   [phone]) [phone] Only accepts [place] Access [place] patients after they have their name applied to their card.   Self-Pay (no insurance) in [facility] [place]:  Organization         Address  Phone   Notes  Sickle Cell Patients, [facility] [address], [place] [phone]) [phone]   [facility] [address], [place] [phone]) [phone]   [facility] [place]  [address], [place] [phone]) [phone]   [facility]/Dr. Osei-Bonsu  [address] [place] [address], [place] or [phone], [place] [phone] Phone number for both [place] and [place] locations is the same.  [facility] [address], [place] [phone]    [facility] [place] [address] [place] [address], [place] or [address] [phone]) [phone]   [facility] [address], [place] [phone]) [phone], phone; [phone]) [phone], fax Sees patients 1st and 3rd Saturday of every month.  Must not qualify for public or private insurance (i.e. Medicaid, Medicare, [place] Health Choice, Veterans' Benefits)  Household income should be no more than 200% of the poverty level The clinic cannot treat you if you are pregnant or think you are pregnant  Sexually transmitted diseases are not treated at the clinic.    Dental Care: Organization         Address  Phone  Notes  [facility] of [facility] [address], [place] [phone]) [phone] Accepts children up to age 33 who are enrolled in [place]icaid or [place] Health Choice; pregnant women with a Medicaid card; and children who have applied for Medicaid or [place] Health Choice, but were declined, whose parents can pay a reduced fee at time of service.  [facility] Department of [facility] [place]  [address], [place] [phone]) [phone] Accepts children up to age 33 who are enrolled in [place] or [place] Health Choice; pregnant women with a Medicaid card; and children who have applied for Medicaid or [place] Health Choice, but were declined, whose parents can pay a reduced fee at time of service.  [facility]  [address], [place] [phone] Patients are seen by appointment only. Walk-ins are not accepted. [facility] will see patients 33 years of age and older. Monday - Tuesday (8am-5pm) Most Wednesdays (8:30-5pm) $30 per visit, cash only  [facility]  [address], [facility] [phone] Patients are seen by appointment only. Walk-ins are not accepted. [facility] will see patients 33 years of age and older. One Wednesday Evening (Monthly: Volunteer Based).   $30 per visit, cash only  [employer] [facility]  [phone] for adults; Children under age 374, call Graduate Pediatric Dentistry at [phone]. Children aged 474-14, please call [phone] to request a pediatric application.  Dental services are provided in all areas of dental care including fillings, crowns and bridges, complete and partial dentures, implants, gum treatment, root canals, and extractions. Preventive care is also provided. Treatment is provided to both adults and  children. Patients are selected via a lottery and there is often a waiting list.   [facility] [address] [place]  [phone] [email or web address]   [facility] [address] [place], [place] [phone], Ext. [phone] Second and Fourth Thursday of each month, opens at 6:30 AM; Clinic ends at 9 AM.  Patients are seen on a first-come first-served basis, and a limited number are seen during each clinic.   [facility]  [address] Hillard Danker [place], [place] [phone]   Eligibility Requirements You must have lived in [place], [place], or [place] counties for at least the last three months.   You cannot be eligible for state or federal sponsored [employer], including [employer], [place], or [employer].   You generally cannot be eligible for healthcare insurance through your employer.    How to apply: Eligibility screenings are held every Tuesday and Wednesday afternoon from 1:00 pm until 4:00 pm. You do not need an appointment for the interview!  [facility] [address] [place], [place]   [place]  [place] Department  [place]  [phone]    Behavioral Health Resources in the Community: Intensive Outpatient Programs Organization         Address  Phone  Notes  [place]. [address], [place], [place] [phone]   [facility]patient [address], [place], [place]   ADS: [facility] [address], [place], [place]   [place] [address]  [place], [place] or [phone]   Substance Abuse Resources Organization         Address  Phone  Notes  Alcohol and Drug Services  [phone]   [place]  2017803287   The [place]   Chinita Pester  [phone]   Residential & Outpatient Substance Abuse Program  [phone]   Psychological Services Organization         Address  Phone  Notes  [facility] [place]  [place]  [phone]   [place] [address] [place] or [phone]    Mobile Crisis Teams Organization         Address  Phone  Notes  [facility], Mobile Crisis Care Unit  [phone]   [facility]  [address] [place], [place]   [place] Levels [address] [place] [phone]    Self-Help/Support Groups Organization         Address  Phone             Notes  [place] of [place] - variety of support groups  [place] Call for more information  Narcotics Anonymous (NA), [facility] [address], [employer] [place]  2 meetings at this location   Special educational needs teacher         Address  Phone  Notes  ASAP Residential Treatment [place],    [place]  [phone]   [facility]  [address] [place] [address], [place], [place]   [place], [place] [phone] Admissions: 8am-3pm M-F  Incentives Substance [place] [address],    [place], [place]   [facility]  [address] Leonard Schwartz [place], [place] [phone]   [facility] [address]  [place], [place] [phone]   [facility] - Intensive Outpatient [address], [place], [place] [phone]   [facility] ([facility]iction [facility] [address] [place].,  [place], [place] [phone] or [phone]   [facility] (RTS) [address] [place], [place] [phone] Accepts Medicaid  [facility] [place] [address],  [place] [phone] Substance Abuse/Addiction Treatment   [facility] Organization         Address  Phone  Notes  [facility]  [phone]   Angie Fava, PhD [address] Ervin Knack [place], [place]   [phone] or [phone]   [facility]   [address] [place], [place] [phone]   [facility] [address] [place], [place] [phone] Insurance/Medicaid/sponsorship through [facility] and Families [address]                                    [place], [place] [phone] Therapy/tele-psych/case  [facility] [address], [place] [phone]    Dr. Lolly Mustache  [phone]   [facility]  [employer] [facility] Dept. 1) [address] [place] [address] [place] 2) [address], [place] 3)  [address], [place] [phone]  [phone]   [facility] Child Abuse Hotline [phone] or [phone] (After Hours)

## 2014-07-18 NOTE — ED Notes (Signed)
Pt also sts he needs Xanax prescription, [facility] due to state regulations.

## 2014-07-18 NOTE — ED Notes (Signed)
Pt reports right sided abscess x2 days. Pain 7/10. Pt reports [facility] is [facility]. Has psychiatrist appointment in a few weeks.

## 2014-09-11 ENCOUNTER — Encounter (HOSPITAL_COMMUNITY): Payer: Self-pay | Admitting: Emergency Medicine

## 2014-09-11 ENCOUNTER — Emergency Department (HOSPITAL_COMMUNITY)
Admission: EM | Admit: 2014-09-11 | Discharge: 2014-09-11 | Disposition: A | Discharge status: Home / Self Care | Payer: No Typology Code available for payment source | Attending: Emergency Medicine | Admitting: Emergency Medicine

## 2014-09-11 DIAGNOSIS — R079 Chest pain, unspecified: Secondary | ICD-10-CM | POA: Insufficient documentation

## 2014-09-11 DIAGNOSIS — Z720 Tobacco use: Secondary | ICD-10-CM | POA: Insufficient documentation

## 2014-09-11 DIAGNOSIS — Z79899 Other long term (current) drug therapy: Secondary | ICD-10-CM | POA: Insufficient documentation

## 2014-09-11 DIAGNOSIS — J069 Acute upper respiratory infection, unspecified: Secondary | ICD-10-CM | POA: Insufficient documentation

## 2014-09-11 DIAGNOSIS — B9789 Other viral agents as the cause of diseases classified elsewhere: Secondary | ICD-10-CM

## 2014-09-11 MED ORDER — CETIRIZINE HCL 10 MG PO CAPS: 10.0000 mg | ORAL_CAPSULE | Freq: Every evening | ORAL | Status: DC | PRN

## 2014-09-11 MED ORDER — BENZONATATE 100 MG PO CAPS: 100.0000 mg | ORAL_CAPSULE | Freq: Three times a day (TID) | ORAL | Status: DC

## 2014-09-11 MED ORDER — ALBUTEROL SULFATE HFA 108 (90 BASE) MCG/ACT IN AERS: 1.0000 | INHALATION_SPRAY | Freq: Four times a day (QID) | RESPIRATORY_TRACT | Status: DC | PRN

## 2014-09-11 NOTE — Discharge Instructions (Signed)
Upper Respiratory Infection, Adult °An upper respiratory infection (URI) is also sometimes known as the common cold. The upper respiratory tract includes the nose, sinuses, throat, trachea, and bronchi. Bronchi are the airways leading to the lungs. Most people improve within 1 week, but symptoms can last up to 2 weeks. A residual cough may last even longer.  °CAUSES °Many different viruses can infect the tissues lining the upper respiratory tract. The tissues become irritated and inflamed and often become very moist. Mucus production is also common. A cold is contagious. You can easily spread the virus to others by oral contact. This includes kissing, sharing a glass, coughing, or sneezing. Touching your mouth or nose and then touching a surface, which is then touched by another person, can also spread the virus. °SYMPTOMS  °Symptoms typically develop 1 to 3 days after you come in contact with a cold virus. Symptoms vary from person to person. They may include: °· Runny nose. °· Sneezing. °· Nasal congestion. °· Sinus irritation. °· Sore throat. °· Loss of voice (laryngitis). °· Cough. °· Fatigue. °· Muscle aches. °· Loss of appetite. °· Headache. °· Low-grade fever. °DIAGNOSIS  °You might diagnose your own cold based on familiar symptoms, since most people get a cold 2 to 3 times a year. Your caregiver can confirm this based on your exam. Most importantly, your caregiver can check that your symptoms are not due to another disease such as strep throat, sinusitis, pneumonia, asthma, or epiglottitis. Blood tests, throat tests, and X-rays are not necessary to diagnose a common cold, but they may sometimes be helpful in excluding other more serious diseases. Your caregiver will decide if any further tests are required. °RISKS AND COMPLICATIONS  °You may be at risk for a more severe case of the common cold if you smoke cigarettes, have chronic heart disease (such as heart failure) or lung disease (such as asthma), or if  you have a weakened immune system. The very young and very old are also at risk for more serious infections. Bacterial sinusitis, middle ear infections, and bacterial pneumonia can complicate the common cold. The common cold can worsen asthma and chronic obstructive pulmonary disease (COPD). Sometimes, these complications can require emergency medical care and may be life-threatening. °PREVENTION  °The best way to protect against getting a cold is to practice good hygiene. Avoid oral or hand contact with people with cold symptoms. Wash your hands often if contact occurs. There is no clear evidence that vitamin C, vitamin E, echinacea, or exercise reduces the chance of developing a cold. However, it is always recommended to get plenty of rest and practice good nutrition. °TREATMENT  °Treatment is directed at relieving symptoms. There is no cure. Antibiotics are not effective, because the infection is caused by a virus, not by bacteria. Treatment may include: °· Increased fluid intake. Sports drinks offer valuable electrolytes, sugars, and fluids. °· Breathing heated mist or steam (vaporizer or shower). °· Eating chicken soup or other clear broths, and maintaining good nutrition. °· Getting plenty of rest. °· Using gargles or lozenges for comfort. °· Controlling fevers with ibuprofen or acetaminophen as directed by your caregiver. °· Increasing usage of your inhaler if you have asthma. °Zinc gel and zinc lozenges, taken in the first 24 hours of the common cold, can shorten the duration and lessen the severity of symptoms. Pain medicines may help with fever, muscle aches, and throat pain. A variety of non-prescription medicines are available to treat congestion and runny nose. Your caregiver   can make recommendations and may suggest nasal or lung inhalers for other symptoms.  °HOME CARE INSTRUCTIONS  °· Only take over-the-counter or prescription medicines for pain, discomfort, or fever as directed by your  caregiver. °· Use a warm mist humidifier or inhale steam from a shower to increase air moisture. This may keep secretions moist and make it easier to breathe. °· Drink enough water and fluids to keep your urine clear or pale yellow. °· Rest as needed. °· Return to work when your temperature has returned to normal or as your caregiver advises. You may need to stay home longer to avoid infecting others. You can also use a face mask and careful hand washing to prevent spread of the virus. °SEEK MEDICAL CARE IF:  °· After the first few days, you feel you are getting worse rather than better. °· You need your caregiver's advice about medicines to control symptoms. °· You develop chills, worsening shortness of breath, or brown or red sputum. These may be signs of pneumonia. °· You develop yellow or brown nasal discharge or pain in the face, especially when you bend forward. These may be signs of sinusitis. °· You develop a fever, swollen neck glands, pain with swallowing, or white areas in the back of your throat. These may be signs of strep throat. °SEEK IMMEDIATE MEDICAL CARE IF:  °· You have a fever. °· You develop severe or persistent headache, ear pain, sinus pain, or chest pain. °· You develop wheezing, a prolonged cough, cough up blood, or have a change in your usual mucus (if you have chronic lung disease). °· You develop sore muscles or a stiff neck. °Document Released: 03/02/2001 Document Revised: 11/29/2011 Document Reviewed: 12/12/2013 °ExitCare® Patient Information ©2015 [employer]. This information is not intended to replace advice given to you by your health care provider. Make sure you discuss any questions you have with your health care provider. ° °Emergency Department Resource Guide °1) Find a Doctor and Pay Out of Pocket °Although you won't have to find out who is covered by your insurance plan, it is a good idea to ask around and get recommendations. You will then need to call the office and see if  the doctor you have chosen will accept you as a new patient and what types of options they offer for patients who are self-pay. Some doctors offer discounts or will set up payment plans for their patients who do not have insurance, but you will need to ask so you aren't surprised when you get to your appointment. ° °2) Contact Your Local Health Department °Not all health departments have doctors that can see patients for sick visits, but many do, so it is worth a call to see if yours does. If you don't know where your local health department is, you can check in your phone book. The CDC also has a tool to help you locate your state's health department, and many state websites also have listings of all of their local health departments. ° °3) Find a Walk-in Clinic °If your illness is not likely to be very severe or complicated, you may want to try a walk in clinic. These are popping up all over the country in pharmacies, drugstores, and shopping centers. They're usually staffed by nurse practitioners or physician assistants that have been trained to treat common illnesses and complaints. They're usually fairly quick and inexpensive. However, if you have serious medical issues or chronic medical problems, these are probably not your best option. ° °  No Primary Care Doctor: °- Call [facility] at  832-8000 - they can help you locate a primary care doctor that  accepts your insurance, provides certain services, etc. °- Physician Referral Service- 1-800-533-3463 ° °Chronic Pain Problems: °Organization         Address  Phone   Notes  °[facility]  (336) 297-2271 Patients need to be referred by their primary care doctor.  ° °Medication Assistance: °Organization         Address  Phone   Notes  °[place] [facility] [address] °[place], [place] [address] (336) 641-8030 --Must be a resident of [place] °-- Must have NO insurance coverage whatsoever (no  Medicaid/ Medicare, etc.) °-- The pt. MUST have a primary care doctor that directs their care regularly and follows them in the community °  °[facility]  (866) 331-1348   °[employer]  (888) 892-1162   ° °Agencies that provide inexpensive medical care: °Organization         Address  Phone   Notes  °[facility]  (336) 832-8035   °[facility]    (336) 832-7272   °[facility] [address] °[place], [place] [address] (336) 832-4777   °[facility] [address], °[place] (336) 271-4999   °[facility]    (336) 373-0678   °[facility]    (336) 272-1050   °[facility] ° [address], [place] Phone:  (336) 832-4444, Fax:  (336) 832-4440 Hours of Operation:  9 am - 6 pm, M-F.  Also accepts Medicaid/Medicare and self-pay.  °[facility] ° [address], [place] Phone: (336) 832-3150, Fax: (336) [phone]. Hours of Operation:  8:30 am - 5:30 pm, M-F.  Also accepts Medicaid and self-pay.  °[facility] [place] [address], [place] Phone: (336) 878-6027   °[facility] [address], [place], [place] (336)723-1848, Ext. 123 Mondays & Thursdays: 7-9 AM.  First 15 patients are seen on a first come, first serve basis. °  ° °Medicaid-accepting [place] Providers: ° °Organization         Address  Phone   Notes  °[facility] [address], [place] (336) 641-2100 Also accepts self-pay patients.  °[facility] [address], [place] ° (336) 856-9996   °[facility] [address], [place] (336) 288-8857   °[facility] [address] [place] [address], [place] (336) 299-7000   °Veita Bland [address], [place]  ° (336) 373-1557 Only accepts [place] Access Medicaid patients after they have their name applied to their card.   ° °Self-Pay (no insurance) in [place]: ° °Organization         Address  Phone   Notes  °Sickle Cell Patients, [facility] [address], [place] (336) 832-1970   °[facility] [address], [place] (336) 832-4400   °[facility] ° [address],  (336) 992-4800   °[facility]/Dr. Osei-Bonsu ° [address] [place] [address], [place] or [address], [place] (336) 841-8500 Phone number for both [place] and [place] locations is the same.  °[facility] [address], [place] (336) 299-0000   °  [facility] [address] [place] [address], [place] or [address] (336) 852-7530 °(336) 878-2260   °[facility] [address], [place] (336) 350-1642, phone; (336) 294-5005, fax Sees patients 1st and 3rd Saturday of every month.  Must not qualify for public or private insurance (i.e. Medicaid, Medicare, [place] Health Choice, Veterans' Benefits) • Household income should be no more than 200% of the poverty level •The clinic cannot treat you if you are pregnant or think you are pregnant • Sexually transmitted diseases are not treated at the clinic.  ° ° °Dental Care: °Organization         Address  Phone  Notes  °[facility] [address], [place] (336) 641-6152 Accepts children up to age 21 who are enrolled in Medicaid or [place] Health Choice; pregnant women with a Medicaid card; and children who have applied for Medicaid or [place] Health Choice, but were declined, whose parents can pay a reduced fee at time of service.  °[facility] [place]  [address], [place] (336) 641-7733 Accepts children up to age 21 who are enrolled in Medicaid or [place] Health Choice; pregnant women with a Medicaid card; and children who have applied for Medicaid or [place] Health Choice,  but were declined, whose parents can pay a reduced fee at time of service.  °[facility] ° [address], [place] (336) 641-4533 Patients are seen by appointment only. Walk-ins are not accepted. [facility] will see patients 18 years of age and older. °Monday - Tuesday (8am-5pm) °Most Wednesdays (8:30-5pm) °$30 per visit, cash only  °[facility] ° [address], [place] (336) 641-4533 Patients are seen by appointment only. Walk-ins are not accepted. [facility] will see patients 18 years of age and older. °One Wednesday Evening (Monthly: Volunteer Based).  $30 per visit, cash only  °[facility]  (919) 537-3737 for adults; Children under age 4, call Graduate Pediatric Dentistry at (919) [phone]. Children aged 4-14, please call (919) [phone] to request a pediatric application. ° Dental services are provided in all areas of dental care including fillings, crowns and bridges, complete and partial dentures, implants, gum treatment, root canals, and extractions. Preventive care is also provided. Treatment is provided to both adults and children. °Patients are selected via a lottery and there is often a waiting list. °  °[facility] [address], °[place] ° (336) 763-8833 [email or web address] °  °[facility] [address], [place], [place] (336)723-1848, Ext. 123 Second and Fourth Thursday of each month, opens at 6:30 AM; Clinic ends at 9 AM.  Patients are seen on a first-come first-served basis, and a limited number are seen during each clinic.  ° °[facility] ° [address], [place], [place] (336) 723-7904   Eligibility Requirements °You must have lived in [place], [place], or [place] counties for at least the last three months. °  You cannot be eligible for state or federal sponsored healthcare insurance, including Veterans Administration, Medicaid, or Medicare. °  You generally  cannot be eligible for healthcare insurance through your employer.  °  How to apply: °Eligibility screenings are held every Tuesday and Wednesday afternoon from 1:00 pm until 4:00 pm. You do not need an appointment for the interview!  °[facility] [address], [place], [place] 336-631-2330   °  [facility] [facility]  336-342-8273   °[facility]  336-703-3100   °[facility]  336-570-6415   ° °Behavioral Health Resources in the Community: °Intensive Outpatient Programs °Organization         Address  Phone  Notes  °[facility] [address], [place], [place] 336-878-6098   °[facility] [address], [place], [place] 336-832-9800   °[facility] [address], [place], [place] ° 336-882-2125   °[facility] [address],  °[place], [place] 1-800-853-5163 or 336-641-4981   °Substance Abuse Resources °Organization         Address  Phone  Notes  °[facility]  336-882-2125   °[facility]  336-784-9470   °[facility]  336-285-9073   °[facility]  336-845-3988   °Residential & Outpatient Substance Abuse Program  1-800-659-3381   °Psychological Services °Organization         Address  Phone  Notes  °[facility]  336- 832-9600   °[facility]  336- 378-7881   °[facility] [address], [place] 1-800-853-5163 or 336-641-4981   ° °Mobile Crisis Teams °Organization         Address  Phone  Notes  °[facility], Mobile Crisis Care Unit  1-877-626-1772   °Assertive °[facility] ° [address] [place], [place] 336-834-9664   °Sharon DeEsch [address] °[place] 336-554-5454   ° °Self-Help/Support Groups °Organization         Address  Phone             Notes  °[facility] - variety of support groups  336- 373-1402 Call for more  information  °Narcotics Anonymous (NA), [facility] [address], °[place]  2 meetings at this location  ° °Residential Treatment Programs °Organization         Address  Phone  Notes  °[facility] [address],    °[place]  1-866-801-8205   °[facility] ° [address], [place], [place] 704-293-8524   °[facility] [address], [place] 336-845-3988 Admissions: 8am-3pm M-F  °[facility] [address]    °[place], [place] 336-841-1104   °[facility] [address], [place], [place] 336-379-7146   °[facility] [address]  °[place], [place] 336-285-9073   °[facility] - Intensive Outpatient [address], [place], [place] 336-852-3033   °[facility] [address]  °[place], [place] 1-877-615-2722 or 336-784-9470   °[facility] [address] [place], [place] 336-227-7417 Accepts Medicaid  °[facility] [address]  °[place] 1-800-659-3381 Substance Abuse/Addiction Treatment  ° °[place] Behavioral Health Resources °Organization         Address  Phone  Notes  °[facility]  (888) 581-9988   °Julie Brannon, PhD [address] [place], [place]   (336) 349-5553 or (336) 951-0000   °[facility]   [address] °[place], [place] (336) 349-4454   °[facility] [address], [place], [place] (336) 342-8316 Insurance/Medicaid/sponsorship through [facility]  °[facility] [address]                                      [place], [place] (336) 342-8316 Therapy/tele-psych/case  °[facility] [address]  ° [place], [place] (336) 349-2233    °Dr. Arfeen  (336) 349-4544   °[facility] [place]  [employer] [place] [facility] 1) [address], [place] °2) [address], [place] °3)  [address], [place] (336)  349-3220 °(336) 342-7768 ° °(336) 342-8140   °[place] [facility] (336) 342-1394 or (336) 342-3537 (After Hours)    ° ° °

## 2014-09-11 NOTE — ED Notes (Signed)
Pt c/o cough and congestion x 2 days. Denies n/v/d. Pt states he is coughong up brown-greenish sputum.

## 2014-09-11 NOTE — ED Provider Notes (Signed)
CSN: [id number]     Arrival date & time 09/11/14  1134 History  This chart was scribed for non-physician practitioner, Eben Burowourtney A Forcucci, PA-C, working with Purvis SheffieldForrest Harrison, MD by Charline BillsEssence Howell, ED Scribe. This patient was seen in room WTR6/WTR6 and the patient's care was started at 12:22 PM.   Chief Complaint  Patient presents with  . Cough  . congestion    The history is provided by the patient. No language interpreter was used.   HPI Comments: Trevor [name] is a 33 y.o. male, with a h/o substance abuse, who presents to the Emergency Department complaining of gradually worsening congestion over the past 2 days. Pt reports associated sore throat, postnasal drip, productive cough with thick sputum and chest pain only with coughing. He denies fever, chills, ear pain, nausea, vomiting, diarrhea, constipation. Pt has been treating with ibuprofen and DayQuil without relief. Pt smokes 1 pack every 3 days. Allergy to Augmentin.  Past Medical History  Diagnosis Date  . Substance abuse   . Dental caries     [facility]   Past Surgical History  Procedure Laterality Date  . Mcl    . Lumbar fusion     No family history on file. History  Substance Use Topics  . Smoking status: Current Every Day Smoker    Types: Cigarettes  . Smokeless tobacco: Not on file  . Alcohol Use: No    Review of Systems  Constitutional: Negative for fever and chills.  HENT: Positive for congestion, postnasal drip and sore throat. Negative for ear pain.   Respiratory: Positive for cough.   Cardiovascular: Positive for chest pain (only with coughing).  Gastrointestinal: Negative for nausea, vomiting, diarrhea and constipation.  All other systems reviewed and are negative.  Allergies  Augmentin  Home Medications   Prior to Admission medications   Medication Sig Start Date End Date Taking? Authorizing Provider  albuterol (PROVENTIL HFA;VENTOLIN HFA) 108 (90 BASE) MCG/ACT inhaler Inhale 1-2  puffs into the lungs every 6 (six) hours as needed for wheezing or shortness of breath. 09/11/14   Doris Mcgilvery A Forcucci, PA-C  ALPRAZolam (XANAX) 1 MG tablet Take 1 mg by mouth 3 (three) times daily.    Historical Provider, MD  benzonatate (TESSALON) 100 MG capsule Take 1 capsule (100 mg total) by mouth every 8 (eight) hours. 09/11/14   Nickalaus Crooke A Forcucci, PA-C  Cetirizine HCl 10 MG CAPS Take 1 capsule (10 mg total) by mouth at bedtime as needed (congestion). 09/11/14   Aristide Waggle A Forcucci, PA-C  ibuprofen (ADVIL,MOTRIN) 200 MG tablet Take 800 mg by mouth every 6 (six) hours as needed for moderate pain (pain).     Historical Provider, MD  sertraline (ZOLOFT) 100 MG tablet Take 100 mg by mouth daily.    Historical Provider, MD  sulfamethoxazole-trimethoprim (SEPTRA DS) 800-160 MG per tablet Take 1 tablet by mouth 2 (two) times daily. 07/18/14   Nicole Pisciotta, PA-C  traMADol (ULTRAM) 50 MG tablet Take 1 tablet (50 mg total) by mouth every 6 (six) hours as needed. 07/18/14   Nicole Pisciotta, PA-C   Triage Vitals: BP 133/75 mmHg  Pulse 90  Temp(Src) 97.9 F (36.6 C) (Oral)  Resp 20  SpO2 100% Physical Exam  Constitutional: He is oriented to person, place, and time. He appears well-developed and well-nourished. No distress.  HENT:  Head: Normocephalic and atraumatic.  Nose: Mucosal edema present.  Mouth/Throat: Uvula is midline. Posterior oropharyngeal erythema (mild) present. No oropharyngeal exudate.  Eyes:  Conjunctivae and EOM are normal. Pupils are equal, round, and reactive to light.  Neck: Neck supple. No tracheal deviation present.  Cardiovascular: Normal rate and regular rhythm.   Pulmonary/Chest: Effort normal and breath sounds normal. No respiratory distress. He has no wheezes. He has no rhonchi. He has no rales. He exhibits tenderness.  Musculoskeletal: Normal range of motion.  Lymphadenopathy:    He has no cervical adenopathy.  Neurological: He is alert and oriented to person,  place, and time.  Skin: Skin is warm and dry.  Psychiatric: He has a normal mood and affect. His behavior is normal.  Nursing note and vitals reviewed.  ED Course  Procedures (including critical care time) DIAGNOSTIC STUDIES: Oxygen Saturation is 100% on RA, normal by my interpretation.    COORDINATION OF CARE: 12:27 PM-Discussed treatment plan which includes Tessalon, Albuterol inhaler, and Cetirizine pt at bedside and pt agreed to plan.   Labs Review Labs Reviewed - No data to display  Imaging Review No results found.   EKG Interpretation None      MDM   Final diagnoses:  Viral upper respiratory tract infection with cough   Patient is a 33 year old male who presents emergency room for evaluation of cough, congestion, and chest pain. Physical exam reveals alert nontoxic-appearing male with stable vital signs. Lungs are clear to auscultation. Discussed that this is likely viral upper respiratory infection. Symptomatic treatment as seen above was provided. Patient to return for symptoms of pneumonia which we discussed. Patient states understanding and agreement at this time. Patient stable for discharge.  I personally performed the services described in this documentation, which was scribed in my presence. The recorded information has been reviewed and is accurate.    Eben Burowourtney A Forcucci, PA-C 09/11/14 1231  Purvis SheffieldForrest Harrison, MD 09/11/14 [phone]

## 2014-11-19 ENCOUNTER — Encounter (HOSPITAL_COMMUNITY): Payer: Self-pay | Admitting: Emergency Medicine

## 2014-11-19 ENCOUNTER — Emergency Department (HOSPITAL_COMMUNITY): Admission: EM | Admit: 2014-11-19 | Payer: Self-pay | Source: Home / Self Care

## 2014-11-19 ENCOUNTER — Emergency Department (HOSPITAL_COMMUNITY)
Admission: EM | Admit: 2014-11-19 | Discharge: 2014-11-19 | Discharge status: Against Medical Advice | Payer: Self-pay | Attending: Neurology | Admitting: Neurology

## 2014-11-19 DIAGNOSIS — R1084 Generalized abdominal pain: Secondary | ICD-10-CM | POA: Insufficient documentation

## 2014-11-19 DIAGNOSIS — R112 Nausea with vomiting, unspecified: Secondary | ICD-10-CM | POA: Insufficient documentation

## 2014-11-19 DIAGNOSIS — Z8719 Personal history of other diseases of the digestive system: Secondary | ICD-10-CM | POA: Insufficient documentation

## 2014-11-19 DIAGNOSIS — Z720 Tobacco use: Secondary | ICD-10-CM | POA: Insufficient documentation

## 2014-11-19 DIAGNOSIS — Z5329 Procedure and treatment not carried out because of patient's decision for other reasons: Secondary | ICD-10-CM | POA: Insufficient documentation

## 2014-11-19 DIAGNOSIS — Z79899 Other long term (current) drug therapy: Secondary | ICD-10-CM | POA: Insufficient documentation

## 2014-11-19 LAB — CBC WITH DIFFERENTIAL/PLATELET
BASOS ABS: 0 10*3/uL (ref 0.0–0.1)
Basophils Relative: 1 % (ref 0–1)
EOS ABS: 0.4 10*3/uL (ref 0.0–0.7)
EOS PCT: 6 % — AB (ref 0–5)
HCT: 44.3 % (ref 39.0–52.0)
Hemoglobin: 15.2 g/dL (ref 13.0–17.0)
Lymphocytes Relative: 50 % — ABNORMAL HIGH (ref 12–46)
Lymphs Abs: 3.7 10*3/uL (ref 0.7–4.0)
MCH: 29.4 pg (ref 26.0–34.0)
MCHC: 34.3 g/dL (ref 30.0–36.0)
MCV: 85.7 fL (ref 78.0–100.0)
MONOS PCT: 6 % (ref 3–12)
Monocytes Absolute: 0.4 10*3/uL (ref 0.1–1.0)
Neutro Abs: 2.8 10*3/uL (ref 1.7–7.7)
Neutrophils Relative %: 37 % — ABNORMAL LOW (ref 43–77)
Platelets: 244 10*3/uL (ref 150–400)
RBC: 5.17 MIL/uL (ref 4.22–5.81)
RDW: 13.8 % (ref 11.5–15.5)
WBC: 7.4 10*3/uL (ref 4.0–10.5)

## 2014-11-19 LAB — I-STAT CHEM 8, ED
BUN: 13 mg/dL (ref 6–23)
CREATININE: 1.1 mg/dL (ref 0.50–1.35)
Calcium, Ion: 1.18 mmol/L (ref 1.12–1.23)
Chloride: 100 mmol/L (ref 96–112)
Glucose, Bld: 103 mg/dL — ABNORMAL HIGH (ref 70–99)
HCT: 50 % (ref 39.0–52.0)
HEMOGLOBIN: 17 g/dL (ref 13.0–17.0)
Potassium: 4.2 mmol/L (ref 3.5–5.1)
Sodium: 138 mmol/L (ref 135–145)
TCO2: 24 mmol/L (ref 0–100)

## 2014-11-19 NOTE — ED Notes (Signed)
Pt states he did not want to wait to be seen. Pt left.

## 2014-11-19 NOTE — ED Notes (Signed)
Pt st's he has had nausea and vomiting x's 3 days.  Denies any diarrhea.  Unable to keep anything down.

## 2014-11-20 ENCOUNTER — Emergency Department (HOSPITAL_COMMUNITY)
Admission: EM | Admit: 2014-11-20 | Discharge: 2014-11-20 | Disposition: A | Discharge status: Home / Self Care | Payer: Self-pay | Attending: Emergency Medicine | Admitting: Emergency Medicine

## 2014-11-20 DIAGNOSIS — R112 Nausea with vomiting, unspecified: Secondary | ICD-10-CM

## 2014-11-20 MED ORDER — SODIUM CHLORIDE 0.9 % IV BOLUS (SEPSIS)
1000.0000 mL | Freq: Once | INTRAVENOUS | Status: AC
  Administered 2014-11-20: 1000 mL via INTRAVENOUS

## 2014-11-20 MED ORDER — ONDANSETRON HCL 4 MG/2ML IJ SOLN
4.0000 mg | Freq: Once | INTRAMUSCULAR | Status: AC
  Administered 2014-11-20: 4 mg via INTRAVENOUS
  Filled 2014-11-20: qty 2

## 2014-11-20 MED ORDER — KETOROLAC TROMETHAMINE 30 MG/ML IJ SOLN
30.0000 mg | Freq: Once | INTRAMUSCULAR | Status: AC
  Administered 2014-11-20: 30 mg via INTRAVENOUS
  Filled 2014-11-20: qty 1

## 2014-11-20 MED ORDER — ONDANSETRON HCL 4 MG PO TABS: 4.0000 mg | ORAL_TABLET | Freq: Four times a day (QID) | ORAL | Status: DC

## 2014-11-20 NOTE — ED Provider Notes (Signed)
CSN: [id number]     Arrival date & time 11/19/14  2221 History   First MD Initiated Contact with Patient 11/20/14 0045     Chief Complaint  Patient presents with  . Emesis     (Consider location/radiation/quality/duration/timing/severity/associated sxs/prior Treatment) HPI Comments: Patient reports that he's had 3 days of nausea and vomiting.  Denies any diarrhea or fever.  States he's been able to tolerate small amounts of fluids today, but no solids.  He does attempt to eat it stays in his system for about 30 minutes and he has episodes of vomiting  Patient is a 34 y.o. male presenting with vomiting. The history is provided by the patient.  Emesis Severity:  Moderate Duration:  3 days Timing:  Intermittent Quality:  Bilious material Able to tolerate:  Liquids How soon after eating does vomiting occur:  30 minutes Progression:  Unchanged Chronicity:  New Recent urination:  Normal Relieved by:  None tried Worsened by:  Nothing tried Ineffective treatments:  None tried Associated symptoms: abdominal pain   Associated symptoms: no arthralgias, no chills, no diarrhea, no fever and no myalgias   Abdominal pain:    Location:  Generalized   Quality:  Aching   Severity:  Mild   Onset quality:  Gradual   Duration:  2 days   Timing:  Unable to specify   Progression:  Unchanged   Chronicity:  New   Past Medical History  Diagnosis Date  . Substance abuse   . Dental caries     [facility]   Past Surgical History  Procedure Laterality Date  . Mcl    . Lumbar fusion     No family history on file. History  Substance Use Topics  . Smoking status: Current Every Day Smoker    Types: Cigarettes  . Smokeless tobacco: Not on file  . Alcohol Use: No    Review of Systems  Constitutional: Negative for fever and chills.  Respiratory: Negative for cough and shortness of breath.   Cardiovascular: Negative for chest pain.  Gastrointestinal: Positive for nausea, vomiting  and abdominal pain. Negative for diarrhea and constipation.  Genitourinary: Negative for dysuria.  Musculoskeletal: Negative for myalgias and arthralgias.  Skin: Negative for rash.  All other systems reviewed and are negative.     Allergies  Augmentin  Home Medications   Prior to Admission medications   Medication Sig Start Date End Date Taking? Authorizing Provider  albuterol (PROVENTIL HFA;VENTOLIN HFA) 108 (90 BASE) MCG/ACT inhaler Inhale 1-2 puffs into the lungs every 6 (six) hours as needed for wheezing or shortness of breath. Patient not taking: Reported on 11/20/2014 09/11/14   Courtney A Forcucci, PA-C  ALPRAZolam Prudy Feeler(XANAX) 1 MG tablet Take 1 mg by mouth 3 (three) times daily.    Historical Provider, MD  benzonatate (TESSALON) 100 MG capsule Take 1 capsule (100 mg total) by mouth every 8 (eight) hours. Patient not taking: Reported on 11/20/2014 09/11/14   Courtney A Forcucci, PA-C  Cetirizine HCl 10 MG CAPS Take 1 capsule (10 mg total) by mouth at bedtime as needed (congestion). Patient not taking: Reported on 11/20/2014 09/11/14   Courtney A Forcucci, PA-C  ibuprofen (ADVIL,MOTRIN) 200 MG tablet Take 800 mg by mouth every 6 (six) hours as needed for moderate pain (pain).     Historical Provider, MD  ondansetron (ZOFRAN) 4 MG tablet Take 1 tablet (4 mg total) by mouth every 6 (six) hours. 11/20/14   Arman FilterGail K Ariaunna Longsworth, NP  sertraline (  ZOLOFT) 100 MG tablet Take 100 mg by mouth daily.    Historical Provider, MD  sulfamethoxazole-trimethoprim (SEPTRA DS) 800-160 MG per tablet Take 1 tablet by mouth 2 (two) times daily. Patient not taking: Reported on 11/20/2014 07/18/14   Joni Reining Pisciotta, PA-C  traMADol (ULTRAM) 50 MG tablet Take 1 tablet (50 mg total) by mouth every 6 (six) hours as needed. Patient not taking: Reported on 11/20/2014 07/18/14   Joni Reining Pisciotta, PA-C   BP 108/58 mmHg  Pulse 71  Temp(Src) 97.7 F (36.5 C) (Oral)  Resp 16  SpO2 97% Physical Exam  Constitutional: He appears  well-developed and well-nourished.  HENT:  Head: Normocephalic.  Mouth/Throat: Oropharynx is clear and moist.  Eyes: Pupils are equal, round, and reactive to light.  Neck: Normal range of motion.  Cardiovascular: Normal rate and regular rhythm.   Pulmonary/Chest: Effort normal and breath sounds normal.  Abdominal: Soft. He exhibits no distension. There is generalized tenderness. There is no rigidity, no rebound and no guarding.  Musculoskeletal: Normal range of motion.  Neurological: He is alert.  Skin: Skin is warm.  Nursing note and vitals reviewed.   ED Course  Procedures (including critical care time) Labs Review Labs Reviewed  CBC WITH DIFFERENTIAL/PLATELET - Abnormal; Notable for the following:    Neutrophils Relative % 37 (*)    Lymphocytes Relative 50 (*)    Eosinophils Relative 6 (*)    All other components within normal limits  I-STAT CHEM 8, ED - Abnormal; Notable for the following:    Glucose, Bld 103 (*)    All other components within normal limits    Imaging Review No results found.   EKG Interpretation None     Patient is tolerating by mouth fluids.  He will be given a prescription for Zofran.  Follow up with his primary care physician as needed MDM   Final diagnoses:  Non-intractable vomiting with nausea, vomiting of unspecified type         Arman Filter, NP 11/20/14 7829  Olivia Mackie, MD 11/20/14 [phone]

## 2014-11-20 NOTE — ED Notes (Signed)
Pt reports pain in lower abdomen and emesis since Sunday.  Pt alert and oriented. Denies diarrhea.

## 2014-11-20 NOTE — ED Notes (Signed)
Patient is drinking ginger ale as PO challenge.

## 2014-11-20 NOTE — Discharge Instructions (Signed)
Nausea and Vomiting Nausea means you feel sick to your stomach. Throwing up (vomiting) is a reflex where stomach contents come out of your mouth. HOME CARE   Take medicine as told by your doctor.  Do not force yourself to eat. However, you do need to drink fluids.  If you feel like eating, eat a normal diet as told by your doctor.  Eat rice, wheat, potatoes, bread, lean meats, yogurt, fruits, and vegetables.  Avoid high-fat foods.  Drink enough fluids to keep your pee (urine) clear or pale yellow.  Ask your doctor how to replace body fluid losses (rehydrate). Signs of body fluid loss (dehydration) include:  Feeling very thirsty.  Dry lips and mouth.  Feeling dizzy.  Dark pee.  Peeing less than normal.  Feeling confused.  Fast breathing or heart rate. GET HELP RIGHT AWAY IF:   You have blood in your throw up.  You have black or bloody poop (stool).  You have a bad headache or stiff neck.  You feel confused.  You have bad belly (abdominal) pain.  You have chest pain or trouble breathing.  You do not pee at least once every 8 hours.  You have cold, clammy skin.  You keep throwing up after 24 to 48 hours.  You have a fever. MAKE SURE YOU:   Understand these instructions.  Will watch your condition.  Will get help right away if you are not doing well or get worse. Document Released: 02/23/2008 Document Revised: 11/29/2011 Document Reviewed: 02/05/2011 [facility] Patient Information 2015 [place]. This information is not intended to replace advice given to you by your health care provider. Make sure you discuss any questions you have with your health care provider. You have been given a prescription for Zofran uses as needed for any further episodes of nausea.  Follow-up with your primary care physician as needed

## 2016-04-23 ENCOUNTER — Emergency Department (HOSPITAL_COMMUNITY)
Admission: EM | Admit: 2016-04-23 | Discharge: 2016-04-23 | Disposition: A | Discharge status: Home / Self Care | Payer: Self-pay | Attending: Emergency Medicine | Admitting: Emergency Medicine

## 2016-04-23 ENCOUNTER — Encounter (HOSPITAL_COMMUNITY): Payer: Self-pay | Admitting: Emergency Medicine

## 2016-04-23 ENCOUNTER — Emergency Department (HOSPITAL_COMMUNITY): Payer: Self-pay

## 2016-04-23 DIAGNOSIS — F17210 Nicotine dependence, cigarettes, uncomplicated: Secondary | ICD-10-CM | POA: Insufficient documentation

## 2016-04-23 DIAGNOSIS — R42 Dizziness and giddiness: Secondary | ICD-10-CM | POA: Insufficient documentation

## 2016-04-23 DIAGNOSIS — R110 Nausea: Secondary | ICD-10-CM | POA: Insufficient documentation

## 2016-04-23 DIAGNOSIS — R0602 Shortness of breath: Secondary | ICD-10-CM | POA: Insufficient documentation

## 2016-04-23 DIAGNOSIS — R042 Hemoptysis: Secondary | ICD-10-CM | POA: Insufficient documentation

## 2016-04-23 DIAGNOSIS — Z79899 Other long term (current) drug therapy: Secondary | ICD-10-CM | POA: Insufficient documentation

## 2016-04-23 DIAGNOSIS — R05 Cough: Secondary | ICD-10-CM

## 2016-04-23 DIAGNOSIS — R059 Cough, unspecified: Secondary | ICD-10-CM

## 2016-04-23 LAB — BASIC METABOLIC PANEL
Anion gap: 9 (ref 5–15)
BUN: 10 mg/dL (ref 6–20)
CHLORIDE: 98 mmol/L — AB (ref 101–111)
CO2: 25 mmol/L (ref 22–32)
Calcium: 9.1 mg/dL (ref 8.9–10.3)
Creatinine, Ser: 0.91 mg/dL (ref 0.61–1.24)
GFR calc Af Amer: >60 mL/min (ref >60)
GLUCOSE: 117 mg/dL — AB (ref 65–99)
POTASSIUM: 3.8 mmol/L (ref 3.5–5.1)
Sodium: 132 mmol/L — ABNORMAL LOW (ref 135–145)

## 2016-04-23 LAB — RAPID HIV SCREEN (HIV 1/2 AB+AG)
HIV 1/2 ANTIBODIES: NONREACTIVE
HIV-1 P24 ANTIGEN - HIV24: NONREACTIVE

## 2016-04-23 LAB — I-STAT TROPONIN, ED: Troponin i, poc: 0 ng/mL (ref 0.00–0.08)

## 2016-04-23 LAB — CBC
HEMATOCRIT: 39.7 % (ref 39.0–52.0)
Hemoglobin: 13.5 g/dL (ref 13.0–17.0)
MCH: 28.4 pg (ref 26.0–34.0)
MCHC: 34 g/dL (ref 30.0–36.0)
MCV: 83.4 fL (ref 78.0–100.0)
Platelets: 269 10*3/uL (ref 150–400)
RBC: 4.76 MIL/uL (ref 4.22–5.81)
RDW: 13.4 % (ref 11.5–15.5)
WBC: 8.2 10*3/uL (ref 4.0–10.5)

## 2016-04-23 MED ORDER — BENZONATATE 100 MG PO CAPS: 100.0000 mg | ORAL_CAPSULE | Freq: Three times a day (TID) | ORAL | 0 refills | Status: DC

## 2016-04-23 MED ORDER — IOPAMIDOL (ISOVUE-370) INJECTION 76%
100.0000 mL | Freq: Once | INTRAVENOUS | Status: AC | PRN
  Administered 2016-04-23: 100 mL via INTRAVENOUS

## 2016-04-23 NOTE — ED Provider Notes (Signed)
[facility]-EMERGENCY DEPT Provider Note   CSN: [id number] Arrival date & time: 04/23/16  1035  First Provider Contact:  First MD Initiated Contact with Patient 04/23/16 1128        History   Chief Complaint Chief Complaint  Patient presents with  . Cough  . Chest Pain    HPI Trevor [name] is a 35 y.o. male.  The history is provided by the patient and medical records.    35 year old male with history of substance abuse, presenting to the ED for hemoptysis for the past 5 weeks. He states initially he had a productive cough with yellow/brown sputum but this has since turned bloody in nature. He denies coughing up clots of blood.  States he was evaluated at a local urgent care 2 weeks ago for this and was tested for TB which was negative. He states he completed a course of azithromycin and cough medication without any improvement in his symptoms. States he's continued having hemoptysis with subjective fever and chills at home. Reports this morning he began having chest pain, localized centrally without any significant radiation. States he also feels nauseated, has been somewhat lightheaded and has been mildly short of breath. He denies any dizziness or diaphoresis. He has no known cardiac history. Does have family cardiac history-- mother and grandfather. He is a daily smoker.  No recent travel, surgeries, prolonged immobilization.  No hx of DVT or PE.  No family hx of clotting disorder.  Past Medical History:  Diagnosis Date  . Dental caries    lost all teeth in [facility]  . Substance abuse     There are no active problems to display for this patient.   Past Surgical History:  Procedure Laterality Date  . LUMBAR FUSION    . MCL         Home Medications    Prior to Admission medications   Medication Sig Start Date End Date Taking? Authorizing Provider  albuterol (PROVENTIL HFA;VENTOLIN HFA) 108 (90 BASE) MCG/ACT inhaler Inhale 1-2 puffs into the lungs every 6 (six) hours as needed  for wheezing or shortness of breath. Patient not taking: Reported on 11/20/2014 09/11/14   Terri Piedra, PA-C  ALPRAZolam Prudy Feeler) 1 MG tablet Take 1 mg by mouth 3 (three) times daily.    Historical Provider, MD  benzonatate (TESSALON) 100 MG capsule Take 1 capsule (100 mg total) by mouth every 8 (eight) hours. Patient not taking: Reported on 11/20/2014 09/11/14   Terri Piedra, PA-C  Cetirizine HCl 10 MG CAPS Take 1 capsule (10 mg total) by mouth at bedtime as needed (congestion). Patient not taking: Reported on 11/20/2014 09/11/14   Toni Amend Forcucci, PA-C  ibuprofen (ADVIL,MOTRIN) 200 MG tablet Take 800 mg by mouth every 6 (six) hours as needed for moderate pain (pain).     Historical Provider, MD  ondansetron (ZOFRAN) 4 MG tablet Take 1 tablet (4 mg total) by mouth every 6 (six) hours. 11/20/14   Earley Favor, NP  sertraline (ZOLOFT) 100 MG tablet Take 100 mg by mouth daily.    Historical Provider, MD  sulfamethoxazole-trimethoprim (SEPTRA DS) 800-160 MG per tablet Take 1 tablet by mouth 2 (two) times daily. Patient not taking: Reported on 11/20/2014 07/18/14   Joni Reining Pisciotta, PA-C  traMADol (ULTRAM) 50 MG tablet Take 1 tablet (50 mg total) by mouth every 6 (six) hours as needed. Patient not taking: Reported on 11/20/2014 07/18/14   Wynetta Emery, PA-C    Family History History reviewed. No pertinent family history.  Social History Social History  Substance Use Topics  . Smoking status: Current Every Day Smoker    Types: Cigarettes  . Smokeless tobacco: Not on file  . Alcohol use No     Allergies   Augmentin [amoxicillin-pot clavulanate]   Review of Systems Review of Systems  Respiratory: Positive for cough and shortness of breath.   Cardiovascular: Positive for chest pain.  Gastrointestinal: Positive for nausea.  Neurological: Positive for light-headedness.  All other systems reviewed and are negative.    Physical Exam Updated Vital Signs BP 104/76 (BP Location: Left  Arm)   Pulse 75   Temp 98.5 F (36.9 C)   Resp 14   Ht 6' (1.829 m)   Wt 93 kg   SpO2 97%   BMI 27.80 kg/m   Physical Exam  Constitutional: He is oriented to person, place, and time. He appears well-developed and well-nourished.  Overall appears well, non-toxic  HENT:  Head: Normocephalic and atraumatic.  Mouth/Throat: Oropharynx is clear and moist.  Eyes: Conjunctivae and EOM are normal. Pupils are equal, round, and reactive to light.  Neck: Normal range of motion.  Cardiovascular: Normal rate, regular rhythm and normal heart sounds.   Pulmonary/Chest: Effort normal and breath sounds normal. No accessory muscle usage. No respiratory distress. He has no decreased breath sounds. He has no wheezes. He has no rhonchi.  Lungs are overall clear without wheezes or rhonchi, no distress, speaking in full sentences without difficulty  Abdominal: Soft. Bowel sounds are normal.  Musculoskeletal: Normal range of motion.  Neurological: He is alert and oriented to person, place, and time.  Skin: Skin is warm and dry. No rash noted.  No rashes  Psychiatric: He has a normal mood and affect.  Nursing note and vitals reviewed.    ED Treatments / Results  Labs (all labs ordered are listed, but only abnormal results are displayed) Labs Reviewed  BASIC METABOLIC PANEL - Abnormal; Notable for the following:       Result Value   Sodium 132 (*)    Chloride 98 (*)    Glucose, Bld 117 (*)    All other components within normal limits  CBC  RAPID HIV SCREEN (HIV 1/2 AB+AG)  I-STAT TROPOININ, ED    EKG  EKG Interpretation  Date/Time:  Friday April 23 2016 10:55:00 EDT Ventricular Rate:  90 PR Interval:    QRS Duration: 88 QT Interval:  463 QTC Calculation: 567 R Axis:   55 Text Interpretation:  Sinus rhythm Borderline T abnormalities, anterior leads Minimal ST elevation, inferior leads Prolonged QT interval Baseline wander in lead(s) II III aVL aVF V2 V4 No previous tracing Confirmed by  KNAPP  MD-J, JON ([id number]) on 04/23/2016 11:07:18 AM       Radiology Dg Chest 2 View  Result Date: 04/23/2016 CLINICAL DATA:  Cough for 5 weeks. Chest pain for 1 day. Questionable hemoptysis EXAM: CHEST  2 VIEW COMPARISON:  April 16, 2013 FINDINGS: There is mild scarring in the left base. There is no edema or consolidation. The heart size and pulmonary vascularity are normal. No adenopathy. No bone lesions. No pneumothorax. IMPRESSION: Mild scarring left base. Lungs elsewhere clear. Cardiac silhouette within normal limits. Electronically Signed   By: Bretta Bang III M.D.   On: 04/23/2016 11:25   Ct Angio Chest Pe W And/or Wo Contrast  Result Date: 04/23/2016 CLINICAL DATA:  Hemoptysis for several weeks with chest pain EXAM: CT ANGIOGRAPHY CHEST WITH CONTRAST TECHNIQUE: Multidetector CT imaging of the  chest was performed using the standard protocol during bolus administration of intravenous contrast. Multiplanar CT image reconstructions and MIPs were obtained to evaluate the vascular anatomy. CONTRAST:  100 mL Isovue 370. COMPARISON:  None. FINDINGS: Mediastinum/Lymph Nodes: No significant hilar or mediastinal adenopathy is identified. The thoracic inlet is within normal limits. Cardiovascular: Thoracic aorta shows no aneurysmal dilatation. The pulmonary artery shows a normal branching pattern bilaterally. No filling defects to suggest pulmonary emboli are noted. Lungs/Pleura: No pulmonary mass, infiltrate, or effusion. Upper abdomen: No acute findings. Musculoskeletal: No chest wall mass or suspicious bone lesions identified. Review of the MIP images confirms the above findings. IMPRESSION: No acute abnormality noted. Electronically Signed   By: Alcide Clever M.D.   On: 04/23/2016 13:54    Procedures Procedures (including critical care time)  Medications Ordered in ED Medications - No data to display   Initial Impression / Assessment and Plan / ED Course  I have reviewed the triage vital signs  and the nursing notes.  Pertinent labs & imaging results that were available during my care of the patient were reviewed by me and considered in my medical decision making (see chart for details).  Clinical Course   35 year old male here with hemoptysis for the past 5 weeks. Has been seen in urgent care test for TB which was negative. He also completed course of antibiotics without improvement. He is afebrile and nontoxic. He overall feels well. His lungs are overall clear without wheezes or rhonchi. He is in no acute respiratory distress. His vital signs are stable. No history of DVT or PE, however given his ongoing hemoptysis is obviously concerned. Chest x-ray is clear. CT angiogram is negative for acute PE, mass, or dissection. His lab work is reassuring, hemoglobin and hematocrit are stable. EKG is nonischemic. Troponin is negative. At this time, patient's hemoptysis may be due to airway irritation from his smoking as I do not see any other obvious source.  He remains hemodynamically stable.  He is requesting medication to help suppress his cough, Rx tessalon written.  He does have the [facility] card, and should follow-up with his primary care physician, he will likely pulmonology consultation/referral so given their information as well.  Discussed plan with patient, he acknowledged understanding and agreed with plan of care.  Return precautions given for new or worsening symptoms.  Final Clinical Impressions(s) / ED Diagnoses   Final diagnoses:  Cough  Hemoptysis    New Prescriptions Discharge Medication List as of 04/23/2016  2:16 PM       Garlon Hatchet, PA-C 04/23/16 1540    Linwood Dibbles, MD 04/27/16 [phone]

## 2016-04-23 NOTE — Discharge Instructions (Signed)
Take the prescribed medication as directed. This should help with cough. Follow-up with your primary care doctor. You also should see a pulmonologist. You may call their office or have your primary care doctor refer you. Return to the ED for new or worsening symptoms.

## 2016-04-23 NOTE — ED Notes (Signed)
Patient transported to CT 

## 2016-04-23 NOTE — ED Notes (Signed)
Discharge instructions, follow up care, and rx x1 reviewed with patient. Patient verbalized understanding. 

## 2016-04-23 NOTE — ED Notes (Signed)
PA at bedside.

## 2016-04-23 NOTE — ED Triage Notes (Addendum)
Pt reports cough with blood for the past 5 weeks. TB test negative. Pt began to have central CP this am accompanied by nausea, lightheadedness and SOB. No dizziness.

## 2016-08-04 ENCOUNTER — Encounter (HOSPITAL_COMMUNITY): Payer: Self-pay | Admitting: Emergency Medicine

## 2016-08-04 ENCOUNTER — Emergency Department (HOSPITAL_COMMUNITY): Payer: Self-pay

## 2016-08-04 ENCOUNTER — Emergency Department (HOSPITAL_COMMUNITY)
Admission: EM | Admit: 2016-08-04 | Discharge: 2016-08-04 | Disposition: A | Discharge status: Home / Self Care | Payer: Self-pay | Attending: Emergency Medicine | Admitting: Emergency Medicine

## 2016-08-04 DIAGNOSIS — Z79899 Other long term (current) drug therapy: Secondary | ICD-10-CM | POA: Insufficient documentation

## 2016-08-04 DIAGNOSIS — J189 Pneumonia, unspecified organism: Secondary | ICD-10-CM

## 2016-08-04 DIAGNOSIS — F17210 Nicotine dependence, cigarettes, uncomplicated: Secondary | ICD-10-CM | POA: Insufficient documentation

## 2016-08-04 HISTORY — DX: Hyperlipidemia, unspecified: E78.5

## 2016-08-04 LAB — CBC WITH DIFFERENTIAL/PLATELET
Basophils Absolute: 0 K/uL (ref 0.0–0.1)
Basophils Relative: 0 %
Eosinophils Absolute: 0.4 K/uL (ref 0.0–0.7)
Eosinophils Relative: 4 %
HCT: 29 % — ABNORMAL LOW (ref 39.0–52.0)
Hemoglobin: 9.4 g/dL — ABNORMAL LOW (ref 13.0–17.0)
Lymphocytes Relative: 20 %
Lymphs Abs: 1.8 K/uL (ref 0.7–4.0)
MCH: 28.3 pg (ref 26.0–34.0)
MCHC: 32.4 g/dL (ref 30.0–36.0)
MCV: 87.3 fL (ref 78.0–100.0)
Monocytes Absolute: 0.9 K/uL (ref 0.1–1.0)
Monocytes Relative: 10 %
Neutro Abs: 5.8 K/uL (ref 1.7–7.7)
Neutrophils Relative %: 66 %
Platelets: 239 K/uL (ref 150–400)
RBC: 3.32 MIL/uL — ABNORMAL LOW (ref 4.22–5.81)
RDW: 15.9 % — ABNORMAL HIGH (ref 11.5–15.5)
WBC: 8.9 K/uL (ref 4.0–10.5)

## 2016-08-04 LAB — I-STAT CHEM 8, ED
BUN: 5 mg/dL — ABNORMAL LOW (ref 6–20)
Calcium, Ion: 1.09 mmol/L — ABNORMAL LOW (ref 1.15–1.40)
Chloride: 97 mmol/L — ABNORMAL LOW (ref 101–111)
Creatinine, Ser: 0.7 mg/dL (ref 0.61–1.24)
Glucose, Bld: 135 mg/dL — ABNORMAL HIGH (ref 65–99)
HCT: 29 % — ABNORMAL LOW (ref 39.0–52.0)
Hemoglobin: 9.9 g/dL — ABNORMAL LOW (ref 13.0–17.0)
Potassium: 3.2 mmol/L — ABNORMAL LOW (ref 3.5–5.1)
Sodium: 135 mmol/L (ref 135–145)
TCO2: 27 mmol/L (ref 0–100)

## 2016-08-04 LAB — URINE MICROSCOPIC-ADD ON
BACTERIA UA: NONE SEEN
RBC / HPF: NONE SEEN RBC/hpf (ref 0–5)
SQUAMOUS EPITHELIAL / LPF: NONE SEEN

## 2016-08-04 LAB — URINALYSIS, ROUTINE W REFLEX MICROSCOPIC
Glucose, UA: NEGATIVE mg/dL
Ketones, ur: NEGATIVE mg/dL
NITRITE: NEGATIVE
Protein, ur: 30 mg/dL — AB
SPECIFIC GRAVITY, URINE: 1.036 — AB (ref 1.005–1.030)
pH: 6 (ref 5.0–8.0)

## 2016-08-04 LAB — RAPID STREP SCREEN (MED CTR MEBANE ONLY): Streptococcus, Group A Screen (Direct): NEGATIVE

## 2016-08-04 MED ORDER — DOXYCYCLINE HYCLATE 100 MG PO CAPS: 100.0000 mg | ORAL_CAPSULE | Freq: Two times a day (BID) | ORAL | 0 refills | Status: DC

## 2016-08-04 MED ORDER — AEROCHAMBER PLUS FLO-VU MEDIUM MISC
1.0000 | Freq: Once | Status: AC
  Administered 2016-08-04: 1

## 2016-08-04 MED ORDER — ALBUTEROL SULFATE HFA 108 (90 BASE) MCG/ACT IN AERS: 2.0000 | INHALATION_SPRAY | Freq: Four times a day (QID) | RESPIRATORY_TRACT | 2 refills | Status: DC | PRN

## 2016-08-04 MED ORDER — DOXYCYCLINE HYCLATE 100 MG PO TABS
100.0000 mg | ORAL_TABLET | Freq: Once | ORAL | Status: AC
  Administered 2016-08-04: 100 mg via ORAL
  Filled 2016-08-04: qty 1

## 2016-08-04 MED ORDER — ALBUTEROL SULFATE HFA 108 (90 BASE) MCG/ACT IN AERS
1.0000 | INHALATION_SPRAY | Freq: Once | RESPIRATORY_TRACT | Status: AC
  Administered 2016-08-04: 1 via RESPIRATORY_TRACT
  Filled 2016-08-04: qty 6.7

## 2016-08-04 MED ORDER — AEROCHAMBER PLUS FLO-VU MEDIUM MISC: 1.0000 | Freq: Once | 0 refills | Status: AC
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
## 2016-08-04 NOTE — Discharge Instructions (Signed)
Take antibiotics as prescribed. Use albuterol inhaler as needed for shortness of breath. Follow-up with your primary care doctor for reevaluation. Return to the emergency department if you experience severe worsening of your symptoms, increased fever, difficulty breathing or chest pain.

## 2016-08-04 NOTE — ED Triage Notes (Signed)
Pt reports 3 day hx of generalized aching, fever and sore throat. Tx with Tylenol.  Temp 101 yesterday. Denies NVD. Denies cough. C/o weakness and aching

## 2016-08-04 NOTE — Progress Notes (Signed)
[facility] needing medication assistance. Patient states, he does not have insurance and cannot fill his antibiotic prescription. CM reviewed patient's record. CM explained to Maryruth HancockAnn Marie RN [facility]. Discussed the program and guidelines along with the $3.00 copay patient is agreeable. [facility] [phone] ATT: Maryruth HancockAnn Marie, received fax confirmation. No further CM needs identified at this time.

## 2016-08-05 ENCOUNTER — Telehealth: Payer: Self-pay | Admitting: *Deleted

## 2016-08-05 LAB — URINE CULTURE: Culture: <10000 — AB

## 2016-08-07 LAB — CULTURE, GROUP A STREP (THRC)

## 2016-12-02 ENCOUNTER — Emergency Department (HOSPITAL_COMMUNITY)
Admission: EM | Admit: 2016-12-02 | Discharge: 2016-12-02 | Disposition: A | Discharge status: Home / Self Care | Payer: Self-pay | Attending: Emergency Medicine | Admitting: Emergency Medicine

## 2016-12-02 ENCOUNTER — Encounter (HOSPITAL_COMMUNITY): Payer: Self-pay | Admitting: Emergency Medicine

## 2016-12-02 ENCOUNTER — Emergency Department (HOSPITAL_COMMUNITY): Payer: Self-pay

## 2016-12-02 DIAGNOSIS — X509XXA Other and unspecified overexertion or strenuous movements or postures, initial encounter: Secondary | ICD-10-CM | POA: Insufficient documentation

## 2016-12-02 DIAGNOSIS — F17210 Nicotine dependence, cigarettes, uncomplicated: Secondary | ICD-10-CM | POA: Insufficient documentation

## 2016-12-02 DIAGNOSIS — S86001A Unspecified injury of right Achilles tendon, initial encounter: Secondary | ICD-10-CM | POA: Insufficient documentation

## 2016-12-02 DIAGNOSIS — Y999 Unspecified external cause status: Secondary | ICD-10-CM | POA: Insufficient documentation

## 2016-12-02 DIAGNOSIS — Z79899 Other long term (current) drug therapy: Secondary | ICD-10-CM | POA: Insufficient documentation

## 2016-12-02 DIAGNOSIS — Y9301 Activity, walking, marching and hiking: Secondary | ICD-10-CM | POA: Insufficient documentation

## 2016-12-02 DIAGNOSIS — Y929 Unspecified place or not applicable: Secondary | ICD-10-CM | POA: Insufficient documentation

## 2016-12-02 MED ORDER — IBUPROFEN 200 MG PO TABS
400.0000 mg | ORAL_TABLET | Freq: Once | ORAL | Status: AC
  Administered 2016-12-02: 400 mg via ORAL
  Filled 2016-12-02: qty 2
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
## 2016-12-02 NOTE — ED Triage Notes (Addendum)
Patient reports right ankle pain since "getting up yesterday and feeling a pop."

## 2017-06-02 ENCOUNTER — Emergency Department (HOSPITAL_COMMUNITY)
Admission: EM | Admit: 2017-06-02 | Discharge: 2017-06-02 | Disposition: A | Discharge status: Home / Self Care | Payer: Self-pay | Attending: Emergency Medicine | Admitting: Emergency Medicine

## 2017-06-02 ENCOUNTER — Encounter (HOSPITAL_COMMUNITY): Payer: Self-pay | Admitting: Emergency Medicine

## 2017-06-02 DIAGNOSIS — Z5321 Procedure and treatment not carried out due to patient leaving prior to being seen by health care provider: Secondary | ICD-10-CM | POA: Insufficient documentation

## 2017-06-02 NOTE — ED Triage Notes (Signed)
Patient c/o erythema, pain and swelling area on abd that has gotten worse in past couple days.

## 2017-06-02 NOTE — ED Notes (Signed)
Pt called for v/s recheck x2, no response from lobby

## 2017-06-02 NOTE — ED Notes (Signed)
Pt called for v/s recheck, no response from lobby 

## 2017-06-03 ENCOUNTER — Encounter (HOSPITAL_COMMUNITY): Payer: Self-pay | Admitting: Emergency Medicine

## 2017-06-03 ENCOUNTER — Emergency Department (HOSPITAL_COMMUNITY)
Admission: EM | Admit: 2017-06-03 | Discharge: 2017-06-04 | Disposition: A | Discharge status: Home / Self Care | Payer: Self-pay | Attending: Emergency Medicine | Admitting: Emergency Medicine

## 2017-06-03 DIAGNOSIS — L02211 Cutaneous abscess of abdominal wall: Secondary | ICD-10-CM | POA: Insufficient documentation

## 2017-06-03 DIAGNOSIS — Z79899 Other long term (current) drug therapy: Secondary | ICD-10-CM | POA: Insufficient documentation

## 2017-06-03 DIAGNOSIS — F17210 Nicotine dependence, cigarettes, uncomplicated: Secondary | ICD-10-CM | POA: Insufficient documentation

## 2017-06-03 DIAGNOSIS — R110 Nausea: Secondary | ICD-10-CM | POA: Insufficient documentation

## 2017-06-03 DIAGNOSIS — L0291 Cutaneous abscess, unspecified: Secondary | ICD-10-CM

## 2017-06-03 MED ORDER — ACETAMINOPHEN 325 MG PO TABS
650.0000 mg | ORAL_TABLET | Freq: Once | ORAL | Status: AC
  Administered 2017-06-03: 650 mg via ORAL
  Filled 2017-06-03: qty 2

## 2017-06-03 MED ORDER — CLINDAMYCIN HCL 300 MG PO CAPS
450.0000 mg | ORAL_CAPSULE | Freq: Once | ORAL | Status: AC
  Administered 2017-06-03: 450 mg via ORAL
  Filled 2017-06-03: qty 1

## 2017-06-03 MED ORDER — CLINDAMYCIN HCL 300 MG PO CAPS: 450.0000 mg | ORAL_CAPSULE | Freq: Once | ORAL | Status: DC

## 2017-06-03 MED ORDER — CLINDAMYCIN HCL 150 MG PO CAPS: 450.0000 mg | ORAL_CAPSULE | Freq: Three times a day (TID) | ORAL | 0 refills | Status: AC

## 2017-06-03 NOTE — Discharge Instructions (Signed)
Please read attached information regarding your condition. Take clindamycin 3 times daily for 1 week. Follow-up and current health and wellness for further evaluation. Take Tylenol or ibuprofen as needed for pain. Return to ED for worsening pain, increased fevers, vomiting.

## 2017-06-03 NOTE — ED Triage Notes (Signed)
Patient c/o abscess to right stomach x1 day. Redness noted to site. Reports nausea, denies V/D.
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
## 2017-06-06 ENCOUNTER — Emergency Department (HOSPITAL_COMMUNITY): Admission: EM | Admit: 2017-06-06 | Discharge: 2017-06-06 | Discharge status: Against Medical Advice | Payer: Self-pay

## 2017-06-07 ENCOUNTER — Emergency Department (HOSPITAL_COMMUNITY)
Admission: EM | Admit: 2017-06-07 | Discharge: 2017-06-07 | Discharge status: Against Medical Advice | Payer: Self-pay | Attending: Emergency Medicine | Admitting: Emergency Medicine

## 2017-06-07 ENCOUNTER — Encounter (HOSPITAL_COMMUNITY): Payer: Self-pay | Admitting: Emergency Medicine

## 2017-06-07 DIAGNOSIS — Z5321 Procedure and treatment not carried out due to patient leaving prior to being seen by health care provider: Secondary | ICD-10-CM | POA: Insufficient documentation

## 2017-06-07 NOTE — ED Notes (Signed)
Called pt for reasses vital no response. 

## 2017-06-07 NOTE — ED Triage Notes (Addendum)
Patient c/o abscess to right side of abdomen onset of Saturday. Pt states abscess is now open. States he was started on antibiotics when he was last seen and has about 4 days left. Denies any pain. C/o subjective fevers. Abscess has redness noted around and pus protruding from middle.

## 2017-08-15 ENCOUNTER — Emergency Department (HOSPITAL_COMMUNITY): Payer: Self-pay

## 2017-08-15 ENCOUNTER — Emergency Department (HOSPITAL_COMMUNITY)
Admission: EM | Admit: 2017-08-15 | Discharge: 2017-08-15 | Disposition: A | Discharge status: Home / Self Care | Payer: Self-pay | Attending: Emergency Medicine | Admitting: Emergency Medicine

## 2017-08-15 ENCOUNTER — Encounter (HOSPITAL_COMMUNITY): Payer: Self-pay

## 2017-08-15 ENCOUNTER — Other Ambulatory Visit: Payer: Self-pay

## 2017-08-15 DIAGNOSIS — F17210 Nicotine dependence, cigarettes, uncomplicated: Secondary | ICD-10-CM | POA: Insufficient documentation

## 2017-08-15 DIAGNOSIS — G8929 Other chronic pain: Secondary | ICD-10-CM | POA: Insufficient documentation

## 2017-08-15 DIAGNOSIS — Z79899 Other long term (current) drug therapy: Secondary | ICD-10-CM | POA: Insufficient documentation

## 2017-08-15 DIAGNOSIS — M25561 Pain in right knee: Secondary | ICD-10-CM | POA: Insufficient documentation

## 2017-08-15 MED ORDER — NAPROXEN 500 MG PO TABS: 500.0000 mg | ORAL_TABLET | Freq: Two times a day (BID) | ORAL | 0 refills | Status: DC
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
## 2017-08-15 NOTE — Discharge Instructions (Signed)
Please read the attached information regarding her condition. Take naproxen as needed for pain and inflammation. Up with orthopedist listed below for further evaluation. Return to ED for worsening pain, red hot or tender joint, numbness in legs, falls or injuries.
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
## 2017-08-15 NOTE — ED Notes (Signed)
Patient transported to X-ray 

## 2017-08-25 ENCOUNTER — Encounter (HOSPITAL_COMMUNITY): Payer: Self-pay | Admitting: Emergency Medicine

## 2017-08-25 ENCOUNTER — Emergency Department (HOSPITAL_COMMUNITY): Payer: Self-pay

## 2017-08-25 ENCOUNTER — Emergency Department (HOSPITAL_COMMUNITY)
Admission: EM | Admit: 2017-08-25 | Discharge: 2017-08-25 | Disposition: A | Discharge status: Home / Self Care | Payer: Self-pay | Attending: Emergency Medicine | Admitting: Emergency Medicine

## 2017-08-25 DIAGNOSIS — Y998 Other external cause status: Secondary | ICD-10-CM | POA: Insufficient documentation

## 2017-08-25 DIAGNOSIS — Z79899 Other long term (current) drug therapy: Secondary | ICD-10-CM | POA: Insufficient documentation

## 2017-08-25 DIAGNOSIS — F17210 Nicotine dependence, cigarettes, uncomplicated: Secondary | ICD-10-CM | POA: Insufficient documentation

## 2017-08-25 DIAGNOSIS — Y33XXXA Other specified events, undetermined intent, initial encounter: Secondary | ICD-10-CM | POA: Insufficient documentation

## 2017-08-25 DIAGNOSIS — Y9289 Other specified places as the place of occurrence of the external cause: Secondary | ICD-10-CM | POA: Insufficient documentation

## 2017-08-25 DIAGNOSIS — S8991XA Unspecified injury of right lower leg, initial encounter: Secondary | ICD-10-CM | POA: Insufficient documentation

## 2017-08-25 DIAGNOSIS — Y939 Activity, unspecified: Secondary | ICD-10-CM | POA: Insufficient documentation

## 2017-08-25 DIAGNOSIS — E785 Hyperlipidemia, unspecified: Secondary | ICD-10-CM | POA: Insufficient documentation

## 2017-08-25 DIAGNOSIS — S8992XA Unspecified injury of left lower leg, initial encounter: Secondary | ICD-10-CM

## 2017-08-25 MED ORDER — OXYCODONE-ACETAMINOPHEN 5-325 MG PO TABS
1.0000 | ORAL_TABLET | Freq: Once | ORAL | Status: AC
  Administered 2017-08-25: 1 via ORAL
  Filled 2017-08-25: qty 1

## 2017-08-25 MED ORDER — OXYCODONE-ACETAMINOPHEN 5-325 MG PO TABS: 1.0000 | ORAL_TABLET | ORAL | 0 refills | Status: DC | PRN
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
## 2017-08-25 NOTE — Discharge Instructions (Signed)
Please read attached information. If you experience any new or worsening signs or symptoms please return to the emergency room for evaluation. Please follow-up with your primary care provider or specialist as discussed. Please use medication prescribed only as directed and discontinue taking if you have any concerning signs or symptoms.   °

## 2017-08-25 NOTE — ED Notes (Signed)
Ortho paged. 

## 2017-08-25 NOTE — Progress Notes (Signed)
Orthopedic Tech Progress Note Patient Details:  Trevor [name] 06/19/1981 [id number]  Ortho Devices Type of Ortho Device: Crutches, Knee Immobilizer Ortho Device/Splint Location: RLE Ortho Device/Splint Interventions: Ordered, Application, Adjustment   Post Interventions Patient Tolerated: Well Instructions Provided: Care of device   Jennye MoccasinHughes, [name] 08/25/2017, 8:28 PM
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
## 2017-09-01 ENCOUNTER — Other Ambulatory Visit: Payer: Self-pay

## 2017-09-02 ENCOUNTER — Emergency Department (HOSPITAL_COMMUNITY)
Admission: EM | Admit: 2017-09-02 | Discharge: 2017-09-02 | Disposition: A | Discharge status: Home / Self Care | Payer: Self-pay | Attending: Emergency Medicine | Admitting: Emergency Medicine

## 2017-09-02 ENCOUNTER — Encounter (HOSPITAL_COMMUNITY): Payer: Self-pay | Admitting: Emergency Medicine

## 2017-09-02 ENCOUNTER — Emergency Department (HOSPITAL_COMMUNITY): Admission: EM | Admit: 2017-09-02 | Discharge: 2017-09-02 | Discharge status: Against Medical Advice | Payer: Self-pay

## 2017-09-02 ENCOUNTER — Other Ambulatory Visit: Payer: Self-pay

## 2017-09-02 DIAGNOSIS — R112 Nausea with vomiting, unspecified: Secondary | ICD-10-CM | POA: Insufficient documentation

## 2017-09-02 DIAGNOSIS — Z5321 Procedure and treatment not carried out due to patient leaving prior to being seen by health care provider: Secondary | ICD-10-CM | POA: Insufficient documentation

## 2017-09-02 LAB — COMPREHENSIVE METABOLIC PANEL
ALK PHOS: 76 U/L (ref 38–126)
ALT: 18 U/L (ref 17–63)
AST: 24 U/L (ref 15–41)
Albumin: 3.8 g/dL (ref 3.5–5.0)
Anion gap: 9 (ref 5–15)
CALCIUM: 9 mg/dL (ref 8.9–10.3)
CO2: 27 mmol/L (ref 22–32)
CREATININE: 0.88 mg/dL (ref 0.61–1.24)
Chloride: 102 mmol/L (ref 101–111)
Glucose, Bld: 90 mg/dL (ref 65–99)
Potassium: 3.8 mmol/L (ref 3.5–5.1)
Sodium: 138 mmol/L (ref 135–145)
Total Bilirubin: 0.3 mg/dL (ref 0.3–1.2)
Total Protein: 6.7 g/dL (ref 6.5–8.1)

## 2017-09-02 LAB — URINALYSIS, ROUTINE W REFLEX MICROSCOPIC
Bilirubin Urine: NEGATIVE
GLUCOSE, UA: NEGATIVE mg/dL
HGB URINE DIPSTICK: NEGATIVE
KETONES UR: 5 mg/dL — AB
LEUKOCYTES UA: NEGATIVE
Nitrite: NEGATIVE
PROTEIN: NEGATIVE mg/dL
Specific Gravity, Urine: 1.016 (ref 1.005–1.030)
pH: 5 (ref 5.0–8.0)

## 2017-09-02 LAB — CBC
HCT: 38.1 % — ABNORMAL LOW (ref 39.0–52.0)
Hemoglobin: 12.5 g/dL — ABNORMAL LOW (ref 13.0–17.0)
MCH: 28.7 pg (ref 26.0–34.0)
MCHC: 32.8 g/dL (ref 30.0–36.0)
MCV: 87.4 fL (ref 78.0–100.0)
PLATELETS: 248 10*3/uL (ref 150–400)
RBC: 4.36 MIL/uL (ref 4.22–5.81)
RDW: 15.2 % (ref 11.5–15.5)
WBC: 8.1 10*3/uL (ref 4.0–10.5)

## 2017-09-02 NOTE — ED Triage Notes (Signed)
Pt states he is been vomiting all day long since yesterday morning, denies pain, fever or chills.

## 2017-09-02 NOTE — ED Notes (Signed)
Pt called twice, no answer 

## 2017-09-02 NOTE — ED Notes (Signed)
Pt not responding when called to be triaged.

## 2017-09-02 NOTE — ED Notes (Signed)
Pt states that he is not going to stay to be seen.

## 2017-09-03 ENCOUNTER — Encounter (HOSPITAL_COMMUNITY): Payer: Self-pay

## 2017-09-03 ENCOUNTER — Emergency Department (HOSPITAL_COMMUNITY)
Admission: EM | Admit: 2017-09-03 | Discharge: 2017-09-03 | Disposition: A | Discharge status: Home / Self Care | Payer: Self-pay | Attending: Emergency Medicine | Admitting: Emergency Medicine

## 2017-09-03 DIAGNOSIS — Y929 Unspecified place or not applicable: Secondary | ICD-10-CM | POA: Insufficient documentation

## 2017-09-03 DIAGNOSIS — M25561 Pain in right knee: Secondary | ICD-10-CM | POA: Insufficient documentation

## 2017-09-03 DIAGNOSIS — Z79899 Other long term (current) drug therapy: Secondary | ICD-10-CM | POA: Insufficient documentation

## 2017-09-03 DIAGNOSIS — X509XXA Other and unspecified overexertion or strenuous movements or postures, initial encounter: Secondary | ICD-10-CM | POA: Insufficient documentation

## 2017-09-03 DIAGNOSIS — Z590 Homelessness: Secondary | ICD-10-CM | POA: Insufficient documentation

## 2017-09-03 DIAGNOSIS — Y939 Activity, unspecified: Secondary | ICD-10-CM | POA: Insufficient documentation

## 2017-09-03 DIAGNOSIS — M25461 Effusion, right knee: Secondary | ICD-10-CM | POA: Insufficient documentation

## 2017-09-03 DIAGNOSIS — F17210 Nicotine dependence, cigarettes, uncomplicated: Secondary | ICD-10-CM | POA: Insufficient documentation

## 2017-09-03 DIAGNOSIS — M25569 Pain in unspecified knee: Secondary | ICD-10-CM

## 2017-09-03 MED ORDER — IBUPROFEN 200 MG PO TABS
600.0000 mg | ORAL_TABLET | Freq: Once | ORAL | Status: AC
  Administered 2017-09-03: 600 mg via ORAL
  Filled 2017-09-03: qty 3

## 2017-09-03 NOTE — ED Provider Notes (Signed)
Patient left the emergency department prior to my evaluation   Azalia Bilisampos, Elli Groesbeck, MD 09/03/17 0700
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
## 2017-09-05 ENCOUNTER — Emergency Department (HOSPITAL_COMMUNITY)
Admission: EM | Admit: 2017-09-05 | Discharge: 2017-09-05 | Disposition: A | Discharge status: Home / Self Care | Payer: Self-pay | Attending: Emergency Medicine | Admitting: Emergency Medicine

## 2017-09-05 ENCOUNTER — Other Ambulatory Visit: Payer: Self-pay

## 2017-09-05 ENCOUNTER — Encounter (HOSPITAL_COMMUNITY): Payer: Self-pay | Admitting: Emergency Medicine

## 2017-09-05 ENCOUNTER — Emergency Department (HOSPITAL_COMMUNITY): Payer: Self-pay

## 2017-09-05 DIAGNOSIS — M25561 Pain in right knee: Secondary | ICD-10-CM | POA: Insufficient documentation

## 2017-09-05 DIAGNOSIS — Z79899 Other long term (current) drug therapy: Secondary | ICD-10-CM | POA: Insufficient documentation

## 2017-09-05 DIAGNOSIS — G8929 Other chronic pain: Secondary | ICD-10-CM | POA: Insufficient documentation

## 2017-09-05 DIAGNOSIS — F17210 Nicotine dependence, cigarettes, uncomplicated: Secondary | ICD-10-CM | POA: Insufficient documentation

## 2017-09-05 MED ORDER — IBUPROFEN 800 MG PO TABS
800.0000 mg | ORAL_TABLET | Freq: Once | ORAL | Status: AC
  Administered 2017-09-05: 800 mg via ORAL
  Filled 2017-09-05: qty 1

## 2017-09-05 MED ORDER — IBUPROFEN 800 MG PO TABS: 800.0000 mg | ORAL_TABLET | Freq: Three times a day (TID) | ORAL | 0 refills | Status: DC

## 2017-09-05 NOTE — ED Notes (Signed)
Pt departed in NAD, refused use of wheelchair.  

## 2017-09-05 NOTE — ED Triage Notes (Signed)
Pt to ED c/o R knee pain and swelling - states this is a recurring episode, this one starting Saturday - was seen at [facility] and discharged. Pt reports he needs his knee drained since the fluid build-up has gotten worse. CSM intact distal. Denies injury. Ambulatory with steady gait.

## 2017-09-05 NOTE — Discharge Instructions (Signed)
Use the knee sleeve as needed for support. Take ibuprofen as needed for pain and swelling. Use ice for pain and swelling. Elevate your knee when you are able. Follow-up with orthopedic doctor for further evaluation of your knee. Return to the emergency room if you develop redness of the knee, fevers, or any new or worsening symptoms.
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
## 2017-09-09 ENCOUNTER — Emergency Department (HOSPITAL_COMMUNITY)
Admission: EM | Admit: 2017-09-09 | Discharge: 2017-09-12 | Disposition: A | Discharge status: Home / Self Care | Payer: Self-pay | Attending: Emergency Medicine | Admitting: Emergency Medicine

## 2017-09-09 ENCOUNTER — Encounter (HOSPITAL_COMMUNITY): Payer: Self-pay

## 2017-09-09 DIAGNOSIS — F1910 Other psychoactive substance abuse, uncomplicated: Secondary | ICD-10-CM | POA: Insufficient documentation

## 2017-09-09 DIAGNOSIS — F17210 Nicotine dependence, cigarettes, uncomplicated: Secondary | ICD-10-CM | POA: Insufficient documentation

## 2017-09-09 DIAGNOSIS — F1420 Cocaine dependence, uncomplicated: Secondary | ICD-10-CM | POA: Insufficient documentation

## 2017-09-09 DIAGNOSIS — G8929 Other chronic pain: Secondary | ICD-10-CM

## 2017-09-09 DIAGNOSIS — Z79899 Other long term (current) drug therapy: Secondary | ICD-10-CM | POA: Insufficient documentation

## 2017-09-09 DIAGNOSIS — R45851 Suicidal ideations: Secondary | ICD-10-CM | POA: Insufficient documentation

## 2017-09-09 HISTORY — DX: Opioid dependence, uncomplicated: F11.20

## 2017-09-09 HISTORY — DX: Other chronic pain: G89.29

## 2017-09-09 LAB — COMPREHENSIVE METABOLIC PANEL
ALT: 24 U/L (ref 17–63)
AST: 30 U/L (ref 15–41)
Albumin: 3.9 g/dL (ref 3.5–5.0)
Alkaline Phosphatase: 84 U/L (ref 38–126)
Anion gap: 7 (ref 5–15)
BUN: <5 mg/dL — ABNORMAL LOW (ref 6–20)
CHLORIDE: 101 mmol/L (ref 101–111)
CO2: 28 mmol/L (ref 22–32)
CREATININE: 0.92 mg/dL (ref 0.61–1.24)
Calcium: 9.1 mg/dL (ref 8.9–10.3)
GFR calc non Af Amer: >60 mL/min (ref >60)
Glucose, Bld: 133 mg/dL — ABNORMAL HIGH (ref 65–99)
Potassium: 3.5 mmol/L (ref 3.5–5.1)
SODIUM: 136 mmol/L (ref 135–145)
Total Bilirubin: 0.5 mg/dL (ref 0.3–1.2)
Total Protein: 6.9 g/dL (ref 6.5–8.1)

## 2017-09-09 LAB — RAPID URINE DRUG SCREEN, HOSP PERFORMED
AMPHETAMINES: NOT DETECTED
BENZODIAZEPINES: NOT DETECTED
Barbiturates: NOT DETECTED
COCAINE: NOT DETECTED
OPIATES: NOT DETECTED
TETRAHYDROCANNABINOL: NOT DETECTED

## 2017-09-09 LAB — ETHANOL: Alcohol, Ethyl (B): <10 mg/dL (ref <10)

## 2017-09-09 LAB — CBC
HCT: 36.7 % — ABNORMAL LOW (ref 39.0–52.0)
HEMOGLOBIN: 12 g/dL — AB (ref 13.0–17.0)
MCH: 28.2 pg (ref 26.0–34.0)
MCHC: 32.7 g/dL (ref 30.0–36.0)
MCV: 86.4 fL (ref 78.0–100.0)
Platelets: 276 10*3/uL (ref 150–400)
RBC: 4.25 MIL/uL (ref 4.22–5.81)
RDW: 15.5 % (ref 11.5–15.5)
WBC: 8.8 10*3/uL (ref 4.0–10.5)

## 2017-09-09 LAB — SALICYLATE LEVEL

## 2017-09-09 LAB — ACETAMINOPHEN LEVEL: Acetaminophen (Tylenol), Serum: <10 ug/mL — ABNORMAL LOW (ref 10–30)

## 2017-09-09 MED ORDER — DOXEPIN HCL 25 MG PO CAPS: 200.0000 mg | ORAL_CAPSULE | ORAL | Status: DC

## 2017-09-09 MED ORDER — NICOTINE 21 MG/24HR TD PT24
21.0000 mg | MEDICATED_PATCH | Freq: Every day | TRANSDERMAL | Status: DC
  Administered 2017-09-09 – 2017-09-11 (×3): 21 mg via TRANSDERMAL
  Filled 2017-09-09 (×4): qty 1

## 2017-09-09 MED ORDER — DOXEPIN HCL 25 MG PO CAPS: 250.0000 mg | ORAL_CAPSULE | ORAL | Status: DC

## 2017-09-09 MED ORDER — ATORVASTATIN CALCIUM 10 MG PO TABS
20.0000 mg | ORAL_TABLET | Freq: Every day | ORAL | Status: DC
  Administered 2017-09-09 – 2017-09-12 (×4): 20 mg via ORAL
  Filled 2017-09-09 (×4): qty 2

## 2017-09-09 MED ORDER — METHADONE HCL 10 MG/ML PO CONC: 122.0000 mg | Freq: Every day | ORAL | Status: DC

## 2017-09-09 MED ORDER — ATORVASTATIN CALCIUM 40 MG PO TABS
40.0000 mg | ORAL_TABLET | Freq: Every day | ORAL | Status: DC
  Administered 2017-09-10 – 2017-09-11 (×2): 40 mg via ORAL
  Filled 2017-09-09 (×2): qty 1

## 2017-09-09 MED ORDER — FAMOTIDINE 20 MG PO TABS
20.0000 mg | ORAL_TABLET | Freq: Two times a day (BID) | ORAL | Status: DC
  Administered 2017-09-09 – 2017-09-12 (×6): 20 mg via ORAL
  Filled 2017-09-09 (×6): qty 1

## 2017-09-09 MED ORDER — ARIPIPRAZOLE 5 MG PO TABS
5.0000 mg | ORAL_TABLET | Freq: Every day | ORAL | Status: DC
  Administered 2017-09-09 – 2017-09-11 (×3): 5 mg via ORAL
  Filled 2017-09-09 (×3): qty 1

## 2017-09-09 MED ORDER — METHADONE HCL 10 MG PO TABS
120.0000 mg | ORAL_TABLET | Freq: Every day | ORAL | Status: DC
  Administered 2017-09-10 – 2017-09-12 (×3): 120 mg via ORAL
  Filled 2017-09-09 (×3): qty 12

## 2017-09-09 MED ORDER — ONDANSETRON HCL 4 MG PO TABS: 4.0000 mg | ORAL_TABLET | Freq: Three times a day (TID) | ORAL | Status: DC | PRN

## 2017-09-09 MED ORDER — SERTRALINE HCL 100 MG PO TABS
100.0000 mg | ORAL_TABLET | Freq: Every day | ORAL | Status: DC
  Administered 2017-09-09 – 2017-09-10 (×2): 100 mg via ORAL
  Filled 2017-09-09 (×2): qty 1

## 2017-09-09 MED ORDER — HYDROXYZINE PAMOATE 50 MG PO CAPS
50.0000 mg | ORAL_CAPSULE | Freq: Three times a day (TID) | ORAL | Status: DC | PRN
  Administered 2017-09-09 – 2017-09-11 (×2): 50 mg via ORAL
  Filled 2017-09-09 (×3): qty 1

## 2017-09-09 MED ORDER — ACETAMINOPHEN 325 MG PO TABS: 650.0000 mg | ORAL_TABLET | ORAL | Status: DC | PRN

## 2017-09-09 MED ORDER — ZOLPIDEM TARTRATE 5 MG PO TABS
5.0000 mg | ORAL_TABLET | Freq: Every evening | ORAL | Status: DC | PRN
  Administered 2017-09-09 – 2017-09-11 (×2): 5 mg via ORAL
  Filled 2017-09-09 (×2): qty 1

## 2017-09-09 MED ORDER — VITAMIN D 1000 UNITS PO TABS
2000.0000 [IU] | ORAL_TABLET | Freq: Every day | ORAL | Status: DC
  Administered 2017-09-09 – 2017-09-12 (×4): 2000 [IU] via ORAL
  Filled 2017-09-09 (×4): qty 2

## 2017-09-09 MED ORDER — SERTRALINE HCL 50 MG PO TABS: 150.0000 mg | ORAL_TABLET | ORAL | Status: DC

## 2017-09-09 NOTE — Progress Notes (Addendum)
Pt chart reviewed.  Pt meets criteria for inpatient tx per Assunta FoundShuvon [name], NP.  Pt referrals [facility] the following hospitals:  [facility]     [facility]  [facility]  [facility]  [facility]  [facility]  [facility]        Disposition CSW's will continue to follow for placement.  Timmothy EulerJean T. Kaylyn LimSutter, MSW, LCSWA Disposition Clinical Social Work [phone] (cell) [phone] (office)

## 2017-09-09 NOTE — ED Notes (Signed)
TTS at this time. 

## 2017-09-09 NOTE — ED Notes (Signed)
Meal tray at bedside.  
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
## 2017-09-09 NOTE — ED Provider Notes (Signed)
He presents with a complete medication list from his Methadone clinic where he dosed with Methadone this morning. He has been on Methadone for 10 years.  Medicines adjusted.   Trevor [name], Trevor Crisp, MD 09/09/17 [phone]
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
## 2017-09-09 NOTE — ED Notes (Signed)
Pt wanded by security and cleared.  

## 2017-09-10 MED ORDER — SERTRALINE HCL 50 MG PO TABS
150.0000 mg | ORAL_TABLET | Freq: Every day | ORAL | Status: DC
  Administered 2017-09-10: 50 mg via ORAL
  Administered 2017-09-11 – 2017-09-12 (×2): 150 mg via ORAL
  Filled 2017-09-10 (×3): qty 1

## 2017-09-10 MED ORDER — DOXEPIN HCL 25 MG PO CAPS
250.0000 mg | ORAL_CAPSULE | Freq: Every day | ORAL | Status: DC
  Administered 2017-09-10 – 2017-09-11 (×2): 250 mg via ORAL
  Filled 2017-09-10 (×2): qty 10

## 2017-09-10 NOTE — ED Notes (Signed)
Pt asking for additional snack. Advised pt of snack times and that lunch will be served soon. States the snack was not enough food. Encouraged pt to follow Medical Clearance Pt Policy. Voiced understanding. EKG being performed.

## 2017-09-10 NOTE — ED Notes (Signed)
Pt in shower. Sitter w/pt.

## 2017-09-10 NOTE — ED Notes (Signed)
Pt ambulatory to F11 w/o difficulty.

## 2017-09-10 NOTE — [facility] Counselor (Signed)
Reassessment- Pt reports SI with no plan. Pt denies HI and AVH.  Pt denies current mental health treatment.  TTS will continue to seek placement for the Pt.  Trevor PhoenixBrandi Hebert [name], [facility] Triage Specialist

## 2017-09-10 NOTE — ED Notes (Signed)
States takes Zoloft 150mg  daily rather than 100mg  and Doxepin 250mg  qhs. Dr Rubin PayorPickering - ordered.

## 2017-09-10 NOTE — ED Notes (Signed)
Pt returned from shower

## 2017-09-10 NOTE — ED Notes (Signed)
Pt asking new Sitter for coffee as he has each new Sitter today. Sitter reminded pt of snack times. Pt voiced understanding.

## 2017-09-10 NOTE — BH Assessment (Signed)
Follow up on referrals- [facility] and [facility]-left message.  Earlene Plateravis, [facility]-full.

## 2017-09-10 NOTE — ED Notes (Signed)
Pt given several cracker snacks and milk.

## 2017-09-10 NOTE — ED Notes (Signed)
Watching tv

## 2017-09-11 NOTE — Progress Notes (Signed)
Provided breakfast and a coke for Pt. Pt sitting in bed eating and drinking meal.

## 2017-09-11 NOTE — ED Notes (Signed)
Pt had pulled Nicotine Patch off and asked for it to be taped back then asked for a new one to be applied. Taped patch down for pt. Suggested for pt to not touch it. Pt then stated "Oh, so you're accusing me of fucking w/it?" Advised pt inappropriate language will not be tolerated. Pt stated "Then don't talk to me!"

## 2017-09-11 NOTE — ED Notes (Signed)
Pt given decaf coffee and crackers

## 2017-09-11 NOTE — ED Notes (Signed)
Pt eating dinner. States his food is undercooked d/t states sees pink in pork. Advised pt barely pink color noted. Advised pt it is fully cooked. Pt noted to be upset and states "I can't eat it". Offered pt Mal[place]key sandwich x 2 - declined.

## 2017-09-11 NOTE — Progress Notes (Signed)
Snack provided for Pt. Pt sitting in bed eating and drinking snack.

## 2017-09-11 NOTE — ED Notes (Signed)
Vistaril given as requested for itching and anxiety.

## 2017-09-11 NOTE — ED Notes (Signed)
Pt up to restroom. Calm and cooperative.  

## 2017-09-11 NOTE — [facility] Counselor (Signed)
[facility] with suicidal ideation and with plan to jump off bridge.  Pt reported a number of psychosocial stresors (mother died, homeless, substance use).  Pt endorsed continued suicidal ideation.  Recommended inpatient care.

## 2017-09-11 NOTE — ED Notes (Signed)
Pt was given 4 packs of sugar with his decaf coffee but is now asking for splenda. Sitter told Pt we are not going to give him more sugar since he just had 4 packs. Pt upset with staff

## 2017-09-11 NOTE — ED Notes (Signed)
Pt ate snack given. Pt then requesting additional snack. Sitter advised him he already had snack and lunch is coming soon. Encouraging pt to follow Medical Clearance Pt Policies' snack times. Pt then began yelling, stating "I'm just kidding! Can't y'all take a fucking joke?" RN spoke w/pt re: his language. Pt voiced understanding.

## 2017-09-12 ENCOUNTER — Encounter (HOSPITAL_COMMUNITY): Payer: Self-pay | Admitting: Nurse Practitioner

## 2017-09-12 ENCOUNTER — Other Ambulatory Visit: Payer: Self-pay

## 2017-09-12 DIAGNOSIS — Z5321 Procedure and treatment not carried out due to patient leaving prior to being seen by health care provider: Secondary | ICD-10-CM | POA: Insufficient documentation

## 2017-09-12 DIAGNOSIS — F419 Anxiety disorder, unspecified: Secondary | ICD-10-CM | POA: Insufficient documentation

## 2017-09-12 DIAGNOSIS — F332 Major depressive disorder, recurrent severe without psychotic features: Secondary | ICD-10-CM

## 2017-09-12 DIAGNOSIS — R45851 Suicidal ideations: Secondary | ICD-10-CM

## 2017-09-12 DIAGNOSIS — F17210 Nicotine dependence, cigarettes, uncomplicated: Secondary | ICD-10-CM

## 2017-09-12 MED ORDER — HYDROXYZINE HCL 50 MG PO TABS
50.0000 mg | ORAL_TABLET | Freq: Three times a day (TID) | ORAL | Status: DC | PRN
  Administered 2017-09-12: 50 mg via ORAL
  Filled 2017-09-12: qty 1
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
## 2017-09-12 NOTE — ED Notes (Signed)
Pt belongings and medications returned. Pt eating lunch in his room and then will go home.

## 2017-09-12 NOTE — Progress Notes (Signed)
LCSW has reviewed case with NP. At this time patient has been psychiatrically cleared and can return home.  RN called x2 to be given disposition, unable to reach thus LCSW will continue to try and reach.  DC plan: home.  Farin Buhman LCSW, MSW Clinical Social Work: System Wide Float Coverage for :  [phone] 

## 2017-09-12 NOTE — ED Triage Notes (Signed)
Pt states has generalized anxiety and is rx zoloft and vistaril but states his anxiety has increased in the last few hours. He is really stressed out and has a lot going on. Pt denies SI/HI. Denies etoh or illicit drug use sts goes to methadone clinic.

## 2017-09-13 ENCOUNTER — Emergency Department (HOSPITAL_COMMUNITY)
Admission: EM | Admit: 2017-09-13 | Discharge: 2017-09-13 | Discharge status: Against Medical Advice | Payer: Self-pay | Attending: Emergency Medicine | Admitting: Emergency Medicine

## 2017-09-13 HISTORY — DX: Bipolar disorder, unspecified: F31.9

## 2017-09-13 HISTORY — DX: Generalized anxiety disorder: F41.1

## 2017-09-13 NOTE — ED Notes (Signed)
Unable to locate patient x1

## 2017-09-14 DIAGNOSIS — R197 Diarrhea, unspecified: Secondary | ICD-10-CM | POA: Insufficient documentation

## 2017-09-14 DIAGNOSIS — R112 Nausea with vomiting, unspecified: Secondary | ICD-10-CM | POA: Insufficient documentation

## 2017-09-14 DIAGNOSIS — F17210 Nicotine dependence, cigarettes, uncomplicated: Secondary | ICD-10-CM | POA: Insufficient documentation

## 2017-09-15 ENCOUNTER — Emergency Department (HOSPITAL_COMMUNITY): Payer: Medicaid Other

## 2017-09-15 ENCOUNTER — Encounter (HOSPITAL_COMMUNITY): Payer: Self-pay

## 2017-09-15 ENCOUNTER — Emergency Department (HOSPITAL_COMMUNITY)
Admission: EM | Admit: 2017-09-15 | Discharge: 2017-09-15 | Disposition: A | Discharge status: Home / Self Care | Payer: Medicaid Other | Attending: Emergency Medicine | Admitting: Emergency Medicine

## 2017-09-15 DIAGNOSIS — R197 Diarrhea, unspecified: Secondary | ICD-10-CM

## 2017-09-15 DIAGNOSIS — R112 Nausea with vomiting, unspecified: Secondary | ICD-10-CM

## 2017-09-15 LAB — COMPREHENSIVE METABOLIC PANEL
ALT: 22 U/L (ref 17–63)
AST: 27 U/L (ref 15–41)
Albumin: 4 g/dL (ref 3.5–5.0)
Alkaline Phosphatase: 71 U/L (ref 38–126)
Anion gap: 9 (ref 5–15)
BILIRUBIN TOTAL: 0.3 mg/dL (ref 0.3–1.2)
BUN: 11 mg/dL (ref 6–20)
CALCIUM: 9.2 mg/dL (ref 8.9–10.3)
CO2: 26 mmol/L (ref 22–32)
CREATININE: 0.72 mg/dL (ref 0.61–1.24)
Chloride: 100 mmol/L — ABNORMAL LOW (ref 101–111)
Glucose, Bld: 89 mg/dL (ref 65–99)
Potassium: 3.8 mmol/L (ref 3.5–5.1)
Sodium: 135 mmol/L (ref 135–145)
TOTAL PROTEIN: 7.2 g/dL (ref 6.5–8.1)

## 2017-09-15 LAB — URINALYSIS, ROUTINE W REFLEX MICROSCOPIC
BILIRUBIN URINE: NEGATIVE
Bacteria, UA: NONE SEEN
GLUCOSE, UA: NEGATIVE mg/dL
HGB URINE DIPSTICK: NEGATIVE
Ketones, ur: 5 mg/dL — AB
NITRITE: NEGATIVE
PH: 6 (ref 5.0–8.0)
Protein, ur: NEGATIVE mg/dL
SPECIFIC GRAVITY, URINE: 1.018 (ref 1.005–1.030)

## 2017-09-15 LAB — CBC
HCT: 37.4 % — ABNORMAL LOW (ref 39.0–52.0)
Hemoglobin: 12.3 g/dL — ABNORMAL LOW (ref 13.0–17.0)
MCH: 28.5 pg (ref 26.0–34.0)
MCHC: 32.9 g/dL (ref 30.0–36.0)
MCV: 86.8 fL (ref 78.0–100.0)
PLATELETS: 275 10*3/uL (ref 150–400)
RBC: 4.31 MIL/uL (ref 4.22–5.81)
RDW: 15.4 % (ref 11.5–15.5)
WBC: 9.4 10*3/uL (ref 4.0–10.5)

## 2017-09-15 LAB — LIPASE, BLOOD: Lipase: 18 U/L (ref 11–51)

## 2017-09-15 MED ORDER — ONDANSETRON HCL 4 MG/2ML IJ SOLN
4.0000 mg | Freq: Once | INTRAMUSCULAR | Status: AC
  Administered 2017-09-15: 4 mg via INTRAVENOUS
  Filled 2017-09-15: qty 2

## 2017-09-15 MED ORDER — PANTOPRAZOLE SODIUM 40 MG IV SOLR
40.0000 mg | Freq: Once | INTRAVENOUS | Status: AC
  Administered 2017-09-15: 40 mg via INTRAVENOUS
  Filled 2017-09-15: qty 40

## 2017-09-15 MED ORDER — ONDANSETRON 8 MG PO TBDP
ORAL_TABLET | ORAL | 0 refills | Status: DC
Start: 2017-09-15 — End: 2018-07-31

## 2017-09-15 MED ORDER — SODIUM CHLORIDE 0.9 % IV BOLUS (SEPSIS)
1000.0000 mL | Freq: Once | INTRAVENOUS | Status: AC
Start: 2017-09-15 — End: 2017-09-15
  Administered 2017-09-15: 1000 mL via INTRAVENOUS
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
## 2017-09-15 NOTE — ED Triage Notes (Signed)
Pt complains of vomiting and diarrhea since this am

## 2017-09-15 NOTE — Discharge Instructions (Signed)
1. Medications: zofran, usual home medications °2. Treatment: rest, drink plenty of fluids, advance diet slowly °3. Follow Up: Please followup with your primary doctor in 2 days for discussion of your diagnoses and further evaluation after today's visit; if you do not have a primary care doctor use the resource guide provided to find one; Please return to the ER for persistent vomiting, high fevers or worsening symptoms ° °

## 2017-09-19 ENCOUNTER — Encounter (HOSPITAL_COMMUNITY): Payer: Self-pay | Admitting: *Deleted

## 2017-09-19 ENCOUNTER — Emergency Department (HOSPITAL_COMMUNITY)
Admission: EM | Admit: 2017-09-19 | Discharge: 2017-09-20 | Disposition: A | Discharge status: Other Acute Inpatient Hospital | Payer: Medicaid Other | Attending: Emergency Medicine | Admitting: Emergency Medicine

## 2017-09-19 ENCOUNTER — Other Ambulatory Visit: Payer: Self-pay

## 2017-09-19 DIAGNOSIS — F314 Bipolar disorder, current episode depressed, severe, without psychotic features: Secondary | ICD-10-CM | POA: Insufficient documentation

## 2017-09-19 DIAGNOSIS — F1120 Opioid dependence, uncomplicated: Secondary | ICD-10-CM | POA: Insufficient documentation

## 2017-09-19 DIAGNOSIS — R45851 Suicidal ideations: Secondary | ICD-10-CM | POA: Insufficient documentation

## 2017-09-19 DIAGNOSIS — R45850 Homicidal ideations: Secondary | ICD-10-CM | POA: Insufficient documentation

## 2017-09-19 DIAGNOSIS — Z79899 Other long term (current) drug therapy: Secondary | ICD-10-CM | POA: Insufficient documentation

## 2017-09-19 DIAGNOSIS — F17210 Nicotine dependence, cigarettes, uncomplicated: Secondary | ICD-10-CM | POA: Insufficient documentation

## 2017-09-19 LAB — COMPREHENSIVE METABOLIC PANEL
ALK PHOS: 72 U/L (ref 38–126)
ALT: 22 U/L (ref 17–63)
ANION GAP: 9 (ref 5–15)
AST: 26 U/L (ref 15–41)
Albumin: 4.3 g/dL (ref 3.5–5.0)
BUN: 6 mg/dL (ref 6–20)
CALCIUM: 9.4 mg/dL (ref 8.9–10.3)
CO2: 30 mmol/L (ref 22–32)
CREATININE: 0.79 mg/dL (ref 0.61–1.24)
Chloride: 99 mmol/L — ABNORMAL LOW (ref 101–111)
Glucose, Bld: 80 mg/dL (ref 65–99)
Potassium: 3.8 mmol/L (ref 3.5–5.1)
Sodium: 138 mmol/L (ref 135–145)
TOTAL PROTEIN: 7.4 g/dL (ref 6.5–8.1)
Total Bilirubin: 0.4 mg/dL (ref 0.3–1.2)

## 2017-09-19 LAB — CBC
HCT: 40 % (ref 39.0–52.0)
Hemoglobin: 13.2 g/dL (ref 13.0–17.0)
MCH: 28.6 pg (ref 26.0–34.0)
MCHC: 33 g/dL (ref 30.0–36.0)
MCV: 86.6 fL (ref 78.0–100.0)
PLATELETS: 283 10*3/uL (ref 150–400)
RBC: 4.62 MIL/uL (ref 4.22–5.81)
RDW: 16 % — AB (ref 11.5–15.5)
WBC: 8.3 10*3/uL (ref 4.0–10.5)

## 2017-09-19 LAB — ACETAMINOPHEN LEVEL

## 2017-09-19 LAB — ETHANOL

## 2017-09-19 LAB — SALICYLATE LEVEL

## 2017-09-19 MED ORDER — ACETAMINOPHEN 325 MG PO TABS: 650.0000 mg | ORAL_TABLET | ORAL | Status: DC | PRN

## 2017-09-19 NOTE — ED Triage Notes (Signed)
Pt reports having thoughts of harming himself and others. Reports that his plan was to OD on pills. Calm and cooperative at triage.

## 2017-09-19 NOTE — ED Notes (Signed)
Pt given a turkey sandwich, apple sauce and coke for snack 
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
## 2017-09-19 NOTE — ED Notes (Signed)
Pt called in lobby with no answer. 

## 2017-09-19 NOTE — ED Notes (Signed)
Pt in paper scrubs at triage, called for sitter and security wanding pt.

## 2017-09-19 NOTE — ED Notes (Signed)
Called Pt 3x. No answer.  

## 2017-09-19 NOTE — BH Assessment (Signed)
Notified of TTS consult request during TTS shift report. Contact [facility] and informed Pt is in a hallway and there are no room available for Pt to receive tele-assessment. [facility] staff will contact TTS when Pt is is a private area and ready.   Harlin RainFord Ellis Patsy BaltimoreWarrick Jr, LPC, [facility], [facility] Triage Specialist [phone]

## 2017-09-20 LAB — RAPID URINE DRUG SCREEN, HOSP PERFORMED
AMPHETAMINES: NOT DETECTED
Barbiturates: NOT DETECTED
Benzodiazepines: NOT DETECTED
COCAINE: POSITIVE — AB
OPIATES: NOT DETECTED
Tetrahydrocannabinol: NOT DETECTED

## 2017-09-20 MED ORDER — FAMOTIDINE 20 MG PO TABS
20.0000 mg | ORAL_TABLET | Freq: Every day | ORAL | Status: DC
  Administered 2017-09-20: 20 mg via ORAL
  Filled 2017-09-20 (×2): qty 1

## 2017-09-20 MED ORDER — ONDANSETRON 4 MG PO TBDP: 8.0000 mg | ORAL_TABLET | ORAL | Status: DC | PRN

## 2017-09-20 MED ORDER — VITAMIN D3 50 MCG (2000 UT) PO TABS: 4000.0000 [IU] | ORAL_TABLET | Freq: Every day | ORAL | Status: DC

## 2017-09-20 MED ORDER — DOXEPIN HCL 25 MG PO CAPS: 50.0000 mg | ORAL_CAPSULE | Freq: Every day | ORAL | Status: DC

## 2017-09-20 MED ORDER — DOXEPIN HCL 25 MG PO CAPS: 200.0000 mg | ORAL_CAPSULE | Freq: Every day | ORAL | Status: DC

## 2017-09-20 MED ORDER — IBUPROFEN 200 MG PO TABS: 200.0000 mg | ORAL_TABLET | Freq: Four times a day (QID) | ORAL | Status: DC | PRN

## 2017-09-20 MED ORDER — METHADONE HCL 10 MG PO TABS
120.0000 mg | ORAL_TABLET | Freq: Once | ORAL | Status: AC
  Administered 2017-09-20: 120 mg via ORAL
  Filled 2017-09-20: qty 12

## 2017-09-20 MED ORDER — ARIPIPRAZOLE 5 MG PO TABS
5.0000 mg | ORAL_TABLET | Freq: Every day | ORAL | Status: DC
  Administered 2017-09-20: 5 mg via ORAL
  Filled 2017-09-20: qty 1

## 2017-09-20 MED ORDER — SERTRALINE HCL 50 MG PO TABS
50.0000 mg | ORAL_TABLET | ORAL | Status: DC
  Administered 2017-09-20: 50 mg via ORAL
  Filled 2017-09-20: qty 1

## 2017-09-20 MED ORDER — HYDROXYZINE PAMOATE 50 MG PO CAPS: 50.0000 mg | ORAL_CAPSULE | Freq: Three times a day (TID) | ORAL | Status: DC | PRN

## 2017-09-20 MED ORDER — ATORVASTATIN CALCIUM 40 MG PO TABS: 40.0000 mg | ORAL_TABLET | Freq: Every day | ORAL | Status: DC

## 2017-09-20 MED ORDER — SERTRALINE HCL 100 MG PO TABS
100.0000 mg | ORAL_TABLET | ORAL | Status: DC
Start: 2017-09-20 — End: 2017-09-20
  Administered 2017-09-20: 100 mg via ORAL
  Filled 2017-09-20: qty 1

## 2017-09-20 NOTE — ED Notes (Signed)
Offered for pt to call someone to notify of tx plan - accepted to [facility]. Advised he does not have anyone to call.

## 2017-09-20 NOTE — ED Notes (Addendum)
[facility] and advise the pt meets placement criteria however [facility] so they will be looking for placement.

## 2017-09-20 NOTE — ED Notes (Signed)
Requested for pharmacist to release order for Methadone so may administer.

## 2017-09-20 NOTE — ED Notes (Signed)
Pt aware and voiced agreement w/acceptance [facility] [address] [facility] Dr Wendall StadeKohl.
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
## 2017-09-20 NOTE — Progress Notes (Signed)
Patient meets criteria for inpatient treatment. CSW faxed referrals to the following inpatient facilities for review:  [facility], [facility], [facility], [facility], [facility], [facility], [facility], [facility]    TTS will continue to seek bed placement.   Baldo DaubJolan Brack Shaddock, MSW, LCSWA Clinical Social Worker (Disposition) [facility]  [phone]

## 2017-09-20 NOTE — ED Notes (Signed)
Pt asking for another breakfast tray d/t does not like items on tray. Advised pt no and that he may have a Malaw[place]urkey sandwich or graham crackers. Voiced understanding  - graham crackers given.

## 2017-09-20 NOTE — Progress Notes (Signed)
Accepted to [facility] [place]   Accepting/attending physician is Dr. Wendall StadeKohl.  Assigned to the [address] unit [facility].  Nurse to Nurse report number is [phone].  The bed is ready, the patient can transport to the facility at anytime.    Jerrol Bananaebecca Berman, RN notified.    Baldo DaubJolan Marlyss Cissell, MSW, LCSWA Clinical Social Worker (Disposition) [facility]  [phone]

## 2017-09-20 NOTE — ED Notes (Signed)
Patient was given a snack and Drink. A Regular Diet was ordered for Lunch. 

## 2017-09-20 NOTE — ED Notes (Signed)
Called [facility] to verify pt's Methadone - [phone] - was advised last dose was 122mg  on 09/19/17. Dr Juleen ChinaKohut aware. Pt aware EDP will order.

## 2017-09-20 NOTE — ED Notes (Signed)
States if he is not going to be given Methadone, he will "check out of this hospital right now because the Methadone Clinic closes at 9:15". Advised pt EDP is aware of his request and will administer med when ordered. Pt states "I don't want to be sick". Discussed w/pt positive UDS for cocaine. States has tested positive before at the Clinic and was advised he could not test positive again or would be kicked out. States used cocaine x 2 days ago x 1. States he remains SI/HI.

## 2017-09-20 NOTE — ED Notes (Signed)
Returned from shower. Sitter w/pt. Shonna ChockAshley, [facility], called inquiring about pt. Advised her pt is able to perform own ADL's.

## 2017-09-20 NOTE — ED Notes (Signed)
Pt ambulatory to shower. Sitter w/pt. 

## 2017-10-17 ENCOUNTER — Other Ambulatory Visit: Payer: Self-pay

## 2017-10-17 ENCOUNTER — Emergency Department (HOSPITAL_COMMUNITY)
Admission: EM | Admit: 2017-10-17 | Discharge: 2017-10-17 | Discharge status: Against Medical Advice | Payer: Medicaid Other

## 2017-10-17 NOTE — Discharge Planning (Signed)
Homeless pt presented to ED for [facility].  EDCM explained that he would not be eligible for MATCH as the [facility] not from a [facility].  EDCM contacted [facility] to find that they COULD advance this pt his medications.  EDCM provided a bus pass to pt for transportation.  Pt very appreciative.   No further CM needs identified at this time.  Fouad Taul J. Lucretia RoersWood, RN, BSN, [place] [phone]

## 2017-12-13 ENCOUNTER — Encounter (HOSPITAL_COMMUNITY): Payer: Self-pay

## 2017-12-13 DIAGNOSIS — R1011 Right upper quadrant pain: Secondary | ICD-10-CM | POA: Insufficient documentation

## 2017-12-13 DIAGNOSIS — R1013 Epigastric pain: Secondary | ICD-10-CM | POA: Insufficient documentation

## 2017-12-13 DIAGNOSIS — F17210 Nicotine dependence, cigarettes, uncomplicated: Secondary | ICD-10-CM | POA: Insufficient documentation

## 2017-12-13 DIAGNOSIS — Z79899 Other long term (current) drug therapy: Secondary | ICD-10-CM | POA: Insufficient documentation

## 2017-12-13 LAB — CBC
HCT: 39.3 % (ref 39.0–52.0)
HEMOGLOBIN: 12.9 g/dL — AB (ref 13.0–17.0)
MCH: 29.2 pg (ref 26.0–34.0)
MCHC: 32.8 g/dL (ref 30.0–36.0)
MCV: 88.9 fL (ref 78.0–100.0)
Platelets: 271 10*3/uL (ref 150–400)
RBC: 4.42 MIL/uL (ref 4.22–5.81)
RDW: 13.8 % (ref 11.5–15.5)
WBC: 12.2 10*3/uL — ABNORMAL HIGH (ref 4.0–10.5)

## 2017-12-13 LAB — BASIC METABOLIC PANEL
ANION GAP: 10 (ref 5–15)
BUN: 12 mg/dL (ref 6–20)
CALCIUM: 9.3 mg/dL (ref 8.9–10.3)
CO2: 26 mmol/L (ref 22–32)
CREATININE: 0.91 mg/dL (ref 0.61–1.24)
Chloride: 103 mmol/L (ref 101–111)
GFR calc Af Amer: >60 mL/min (ref >60)
GFR calc non Af Amer: >60 mL/min (ref >60)
GLUCOSE: 92 mg/dL (ref 65–99)
Potassium: 3.8 mmol/L (ref 3.5–5.1)
Sodium: 139 mmol/L (ref 135–145)

## 2017-12-13 LAB — I-STAT TROPONIN, ED: TROPONIN I, POC: 0 ng/mL (ref 0.00–0.08)

## 2017-12-13 NOTE — ED Triage Notes (Signed)
Pt endorses central chest pain "that has been going on for 2 months" Worse with palpation. Endorses some nausea. EMS gave pt 4 of zofran and 324 asa. VSS with EMS.

## 2017-12-14 ENCOUNTER — Emergency Department (HOSPITAL_COMMUNITY)
Admission: EM | Admit: 2017-12-14 | Discharge: 2017-12-14 | Disposition: A | Discharge status: Home / Self Care | Payer: Self-pay | Attending: Emergency Medicine | Admitting: Emergency Medicine

## 2017-12-14 ENCOUNTER — Emergency Department (HOSPITAL_COMMUNITY): Payer: Self-pay

## 2017-12-14 DIAGNOSIS — R1013 Epigastric pain: Secondary | ICD-10-CM

## 2017-12-14 DIAGNOSIS — R109 Unspecified abdominal pain: Secondary | ICD-10-CM

## 2017-12-14 LAB — HEPATIC FUNCTION PANEL
ALBUMIN: 4 g/dL (ref 3.5–5.0)
ALT: 18 U/L (ref 17–63)
AST: 24 U/L (ref 15–41)
Alkaline Phosphatase: 65 U/L (ref 38–126)
BILIRUBIN TOTAL: 0.4 mg/dL (ref 0.3–1.2)
Bilirubin, Direct: <0.1 mg/dL — ABNORMAL LOW (ref 0.1–0.5)
Total Protein: 7.3 g/dL (ref 6.5–8.1)

## 2017-12-14 LAB — LIPASE, BLOOD: LIPASE: 23 U/L (ref 11–51)

## 2017-12-14 MED ORDER — FAMOTIDINE 20 MG PO TABS
20.0000 mg | ORAL_TABLET | Freq: Two times a day (BID) | ORAL | 0 refills | Status: DC
Start: 2017-12-14 — End: 2018-07-31

## 2017-12-14 NOTE — ED Notes (Signed)
Chest xray previously cancelled by xray after pt had been called several times in lobby without answering. Pt found in lobby by tech, xray re-ordered
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
## 2017-12-14 NOTE — Discharge Instructions (Addendum)
Take Protonix and Pepcid as prescribed.  Follow-up with either family doctor or gastroenterology as referred.  Please return if worsening symptoms

## 2017-12-25 ENCOUNTER — Other Ambulatory Visit: Payer: Self-pay

## 2017-12-25 ENCOUNTER — Encounter (HOSPITAL_COMMUNITY): Payer: Self-pay | Admitting: *Deleted

## 2017-12-25 ENCOUNTER — Emergency Department (HOSPITAL_COMMUNITY): Payer: Medicaid Other

## 2017-12-25 ENCOUNTER — Emergency Department (HOSPITAL_COMMUNITY)
Admission: EM | Admit: 2017-12-25 | Discharge: 2017-12-25 | Disposition: A | Discharge status: Home / Self Care | Payer: Medicaid Other | Attending: Emergency Medicine | Admitting: Emergency Medicine

## 2017-12-25 DIAGNOSIS — G8929 Other chronic pain: Secondary | ICD-10-CM | POA: Insufficient documentation

## 2017-12-25 DIAGNOSIS — Z79899 Other long term (current) drug therapy: Secondary | ICD-10-CM | POA: Insufficient documentation

## 2017-12-25 DIAGNOSIS — F17210 Nicotine dependence, cigarettes, uncomplicated: Secondary | ICD-10-CM | POA: Insufficient documentation

## 2017-12-25 DIAGNOSIS — M25561 Pain in right knee: Secondary | ICD-10-CM | POA: Insufficient documentation

## 2017-12-25 MED ORDER — NAPROXEN 250 MG PO TABS
375.0000 mg | ORAL_TABLET | Freq: Once | ORAL | Status: AC
  Administered 2017-12-25: 375 mg via ORAL
  Filled 2017-12-25: qty 2

## 2017-12-25 MED ORDER — NAPROXEN 375 MG PO TABS: 375.0000 mg | ORAL_TABLET | Freq: Two times a day (BID) | ORAL | 0 refills | Status: DC

## 2017-12-25 MED ORDER — LIDOCAINE 5 % EX PTCH
1.0000 | MEDICATED_PATCH | CUTANEOUS | Status: DC
  Administered 2017-12-25: 1 via TRANSDERMAL
  Filled 2017-12-25: qty 1

## 2017-12-25 MED ORDER — LIDOCAINE 5 % EX PTCH: 1.0000 | MEDICATED_PATCH | CUTANEOUS | 0 refills | Status: DC

## 2017-12-25 NOTE — ED Notes (Signed)
Brought patient back to room.  Offered wheel chair.  Refused.  States I'm fine.

## 2017-12-25 NOTE — Discharge Instructions (Addendum)
Your x-ray shows no signs of bony fractures.  You need to follow-up with the orthopedic doctor. Please take the Naproxen as prescribed for pain. Do not take any additional NSAIDs including Motrin, Aleve, Ibuprofen, Advil.
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
## 2017-12-25 NOTE — ED Triage Notes (Signed)
The pt is c/o of rt knee pain for 15 years  His rt knee has been more painful for 2-3 months

## 2018-01-02 ENCOUNTER — Emergency Department (HOSPITAL_COMMUNITY)
Admission: EM | Admit: 2018-01-02 | Discharge: 2018-01-02 | Disposition: A | Discharge status: Home / Self Care | Payer: Medicaid Other | Attending: Emergency Medicine | Admitting: Emergency Medicine

## 2018-01-02 ENCOUNTER — Encounter (HOSPITAL_COMMUNITY): Payer: Self-pay | Admitting: Emergency Medicine

## 2018-01-02 ENCOUNTER — Other Ambulatory Visit: Payer: Self-pay

## 2018-01-02 DIAGNOSIS — Z79899 Other long term (current) drug therapy: Secondary | ICD-10-CM | POA: Insufficient documentation

## 2018-01-02 DIAGNOSIS — F17210 Nicotine dependence, cigarettes, uncomplicated: Secondary | ICD-10-CM | POA: Insufficient documentation

## 2018-01-02 DIAGNOSIS — R110 Nausea: Secondary | ICD-10-CM | POA: Insufficient documentation

## 2018-01-02 LAB — CBC
HEMATOCRIT: 41.9 % (ref 39.0–52.0)
Hemoglobin: 14 g/dL (ref 13.0–17.0)
MCH: 29.5 pg (ref 26.0–34.0)
MCHC: 33.4 g/dL (ref 30.0–36.0)
MCV: 88.2 fL (ref 78.0–100.0)
Platelets: 252 10*3/uL (ref 150–400)
RBC: 4.75 MIL/uL (ref 4.22–5.81)
RDW: 13.2 % (ref 11.5–15.5)
WBC: 6.5 10*3/uL (ref 4.0–10.5)

## 2018-01-02 LAB — COMPREHENSIVE METABOLIC PANEL
ALT: 18 U/L (ref 17–63)
ANION GAP: 11 (ref 5–15)
AST: 22 U/L (ref 15–41)
Albumin: 4.1 g/dL (ref 3.5–5.0)
Alkaline Phosphatase: 69 U/L (ref 38–126)
BILIRUBIN TOTAL: 0.6 mg/dL (ref 0.3–1.2)
BUN: 10 mg/dL (ref 6–20)
CO2: 26 mmol/L (ref 22–32)
Calcium: 9.5 mg/dL (ref 8.9–10.3)
Chloride: 102 mmol/L (ref 101–111)
Creatinine, Ser: 0.92 mg/dL (ref 0.61–1.24)
Glucose, Bld: 103 mg/dL — ABNORMAL HIGH (ref 65–99)
POTASSIUM: 4 mmol/L (ref 3.5–5.1)
Sodium: 139 mmol/L (ref 135–145)
TOTAL PROTEIN: 7.3 g/dL (ref 6.5–8.1)

## 2018-01-02 LAB — LIPASE, BLOOD: Lipase: 20 U/L (ref 11–51)

## 2018-01-02 MED ORDER — ONDANSETRON 4 MG PO TBDP
4.0000 mg | ORAL_TABLET | Freq: Once | ORAL | Status: AC
  Administered 2018-01-02: 4 mg via ORAL
  Filled 2018-01-02: qty 1

## 2018-01-02 NOTE — ED Triage Notes (Signed)
Pt reports nausea. Pt denies any pain just feels nauseated and no appetite.

## 2018-01-02 NOTE — ED Notes (Signed)
Patient requesting coffee with "a lot of cream and a lot of sugar".  Coffee given per RN

## 2018-01-02 NOTE — Discharge Instructions (Addendum)
Please read attached information. If you experience any new or worsening signs or symptoms please return to the emergency room for evaluation. Please follow-up with your primary care provider or specialist as discussed.  °
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
## 2018-03-18 IMAGING — DX DG KNEE COMPLETE 4+V*R*
4 series · 4 of 4 positions shown · non-contrast
Comparison: 05/20/2011

CLINICAL DATA: Right knee pain for years. Popping sensation when
walking.

EXAM:
RIGHT KNEE - COMPLETE 4+ VIEW

[knee ap]
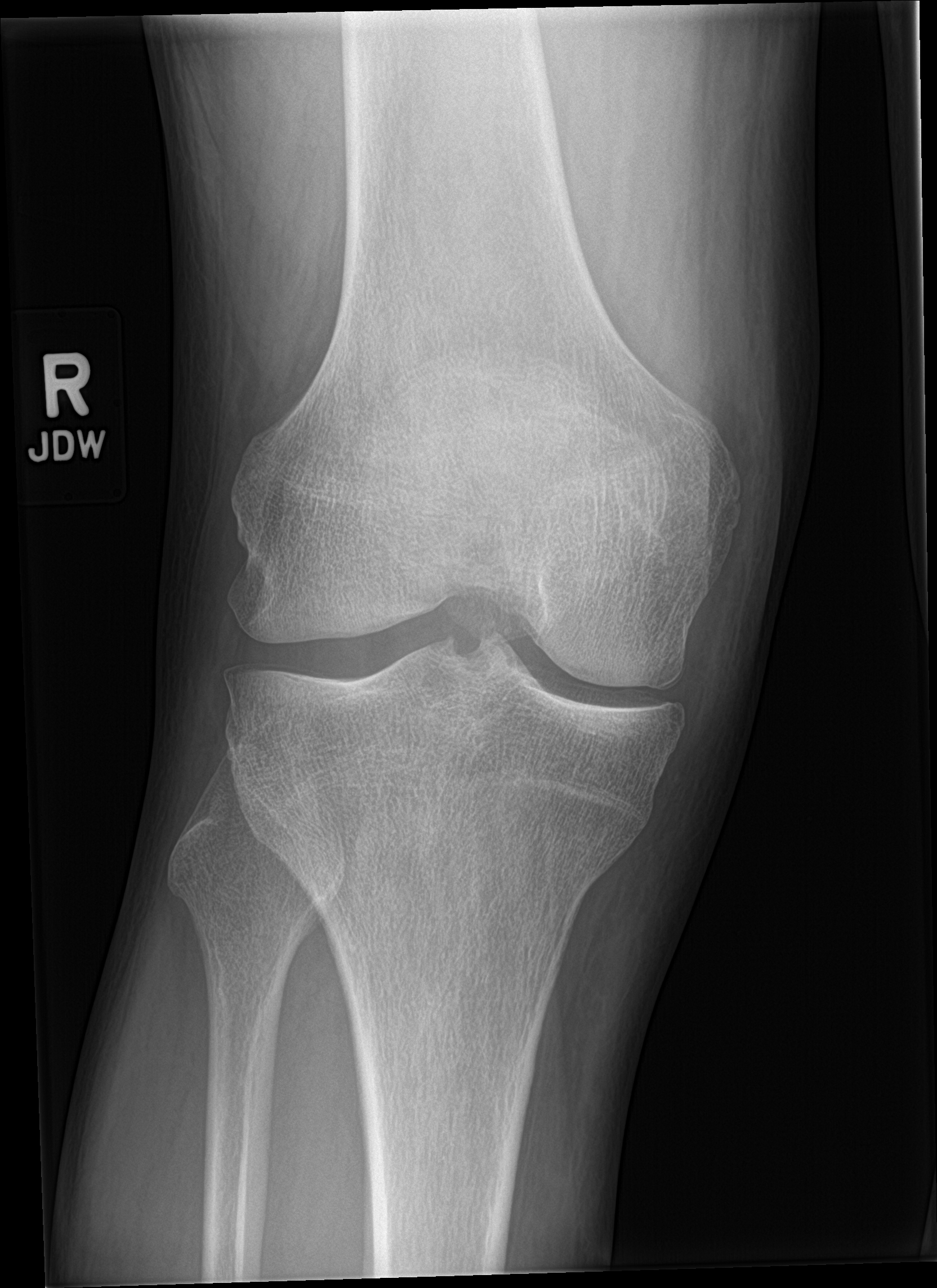

[knee lat]
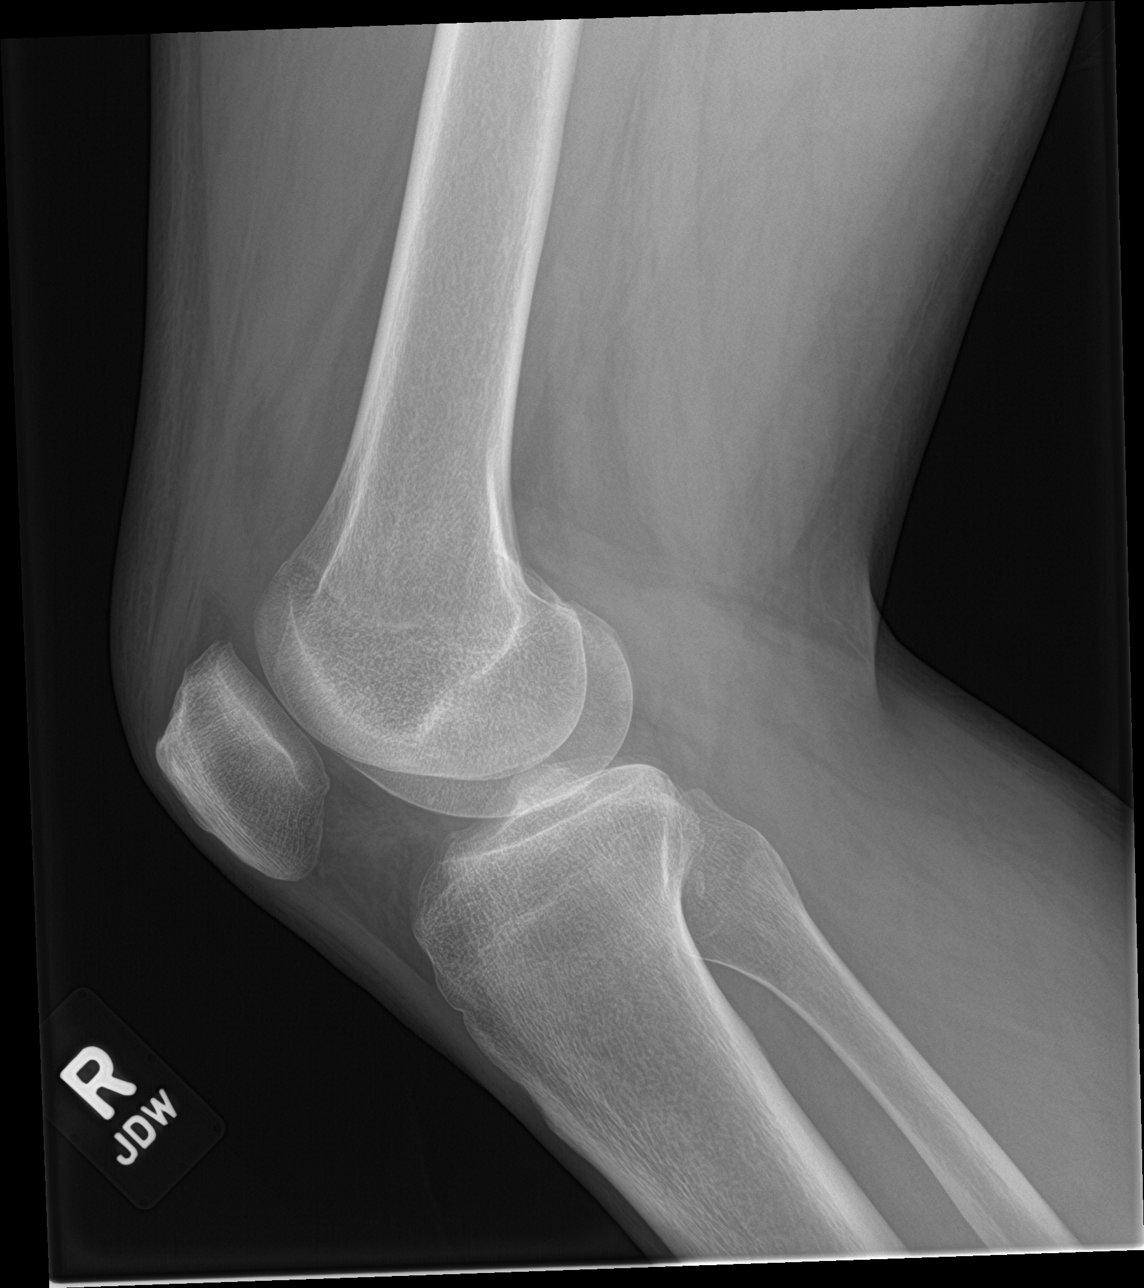

[knee obl (1 of 2)]
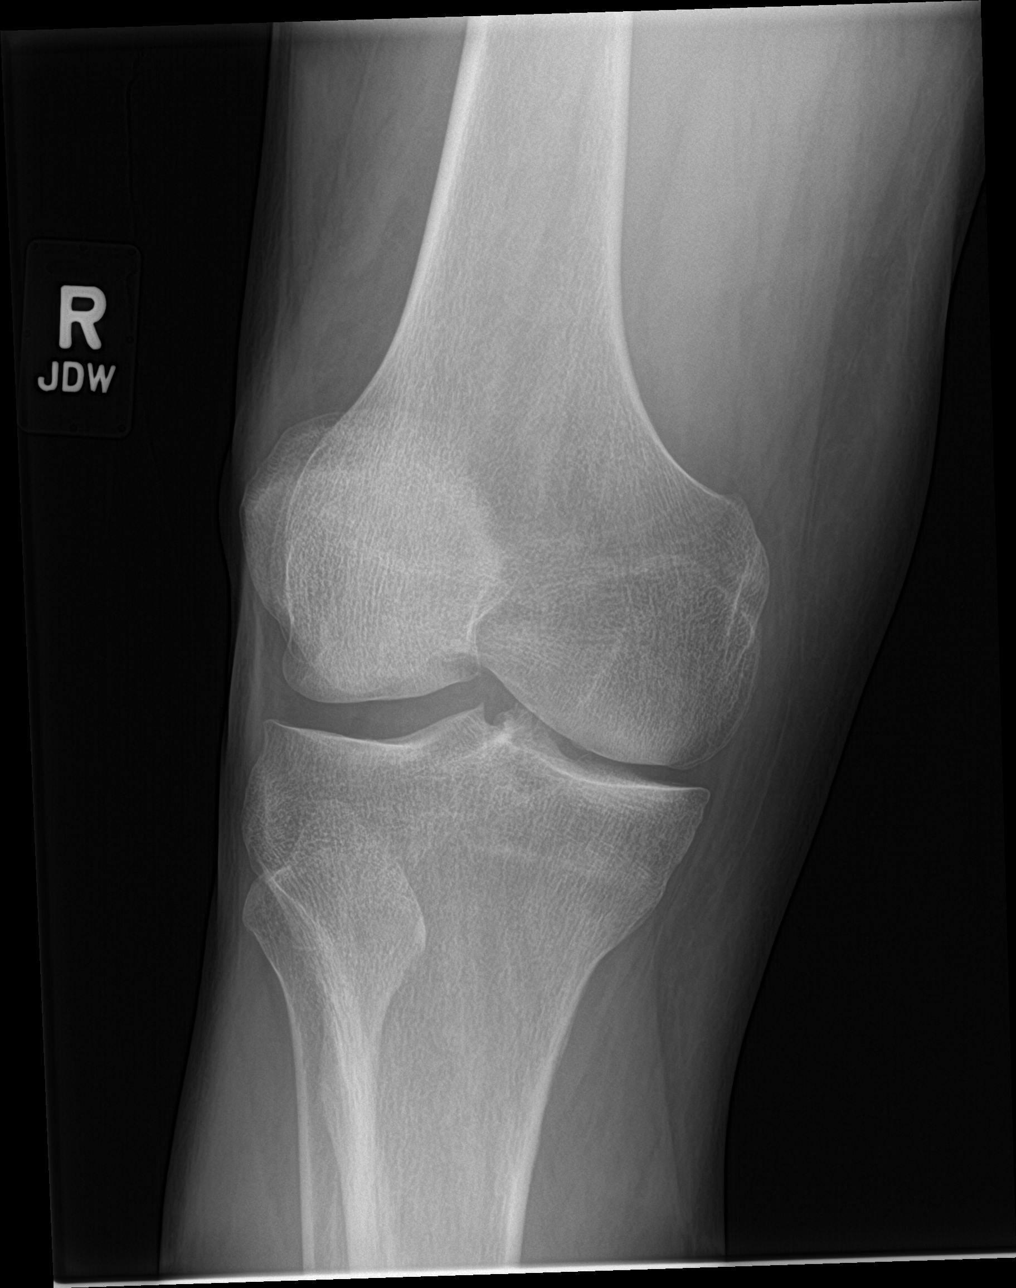

[knee obl (2 of 2)]
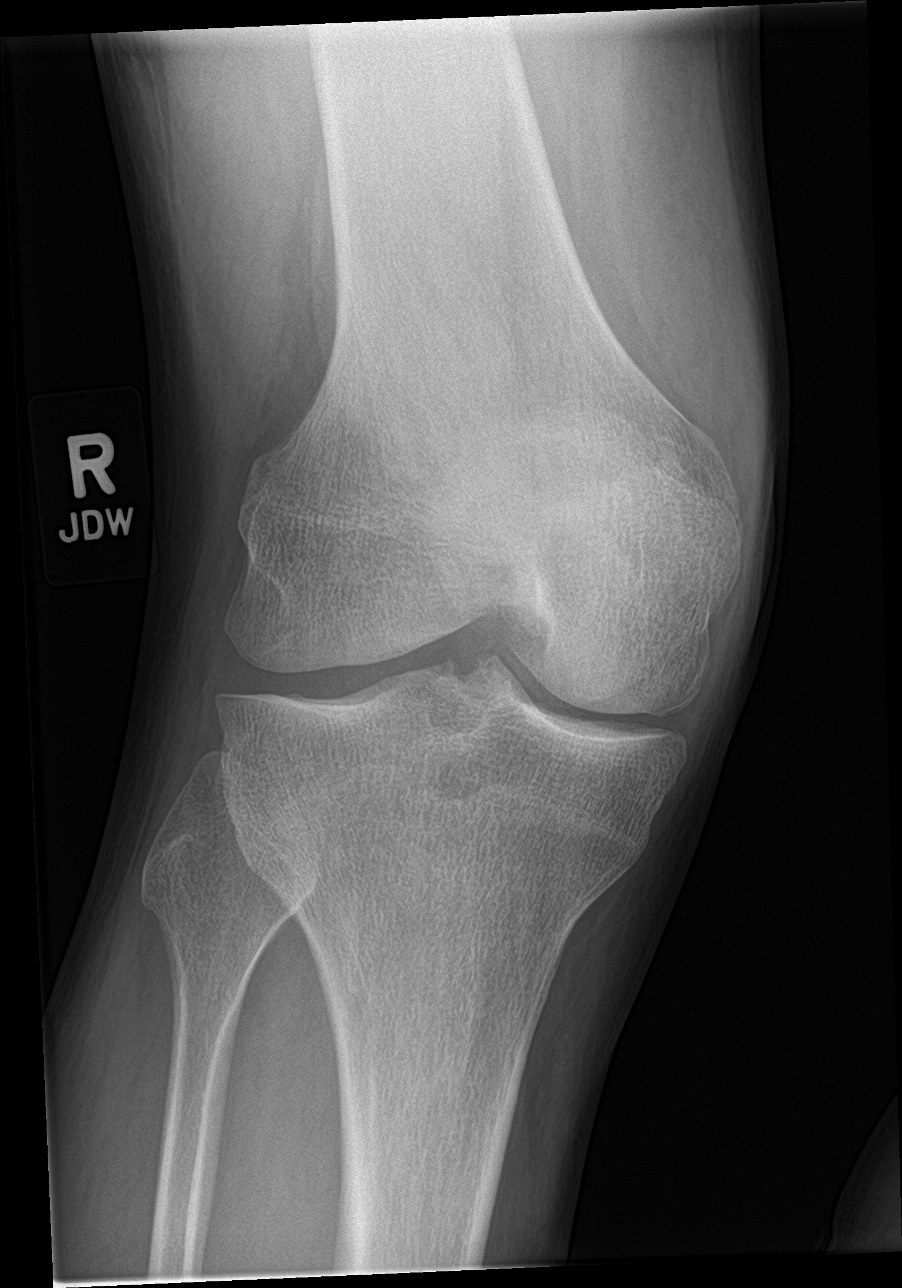

[4 of 4 positions shown; findings below may reference images not displayed]

FINDINGS: Probable small joint effusion. No fracture, erosion, or degenerative
joint narrowing.
IMPRESSION: Minimal joint fluid without acute finding or degenerative joint
narrowing.

## 2018-04-18 IMAGING — CR DG ABDOMEN ACUTE W/ 1V CHEST
4 series · 4 of 4 positions shown · non-contrast
Comparison: Chest radiograph performed 08/04/2016, and pelvis
radiograph performed 04/16/2013

CLINICAL DATA: Acute onset of lower abdominal pain, nausea and
vomiting. Cough.

EXAM:
DG ABDOMEN ACUTE W/ 1V CHEST

[w chest pa]
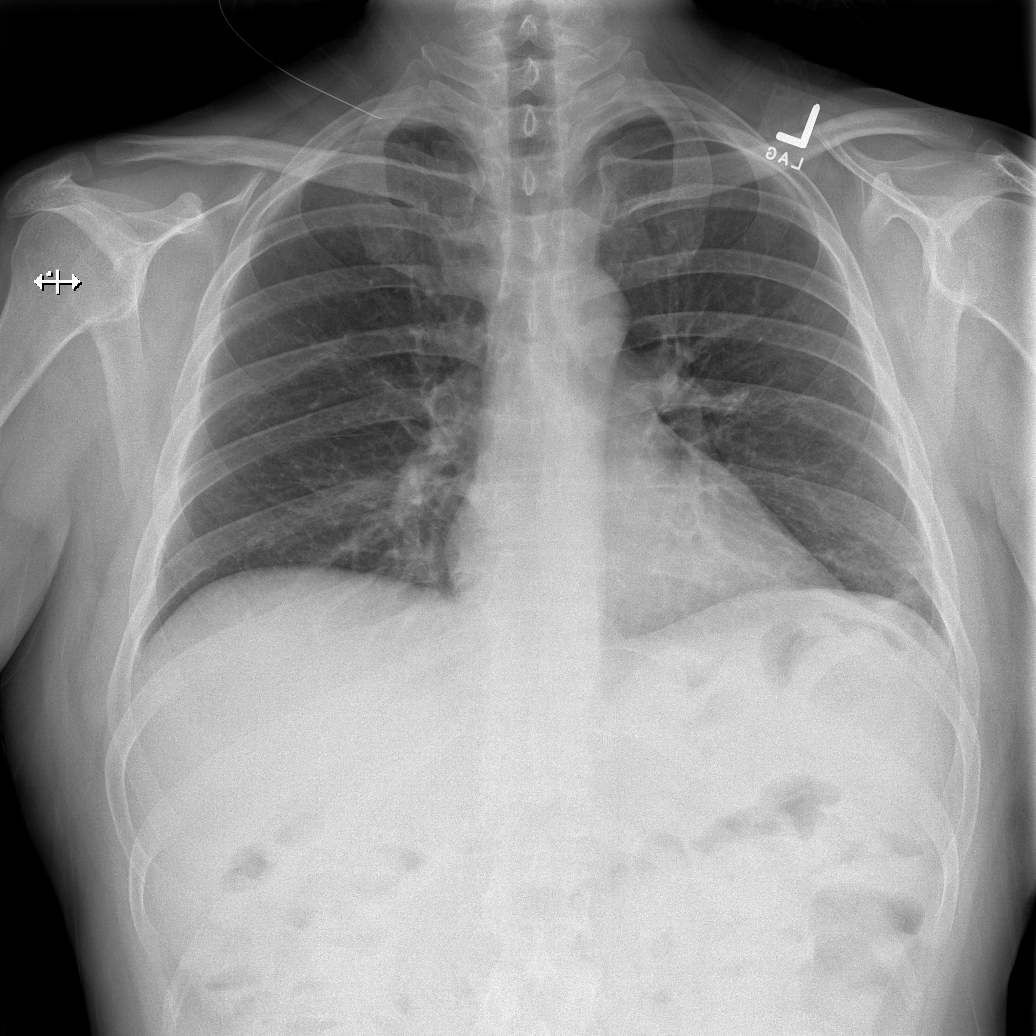

[w abdomen upright]
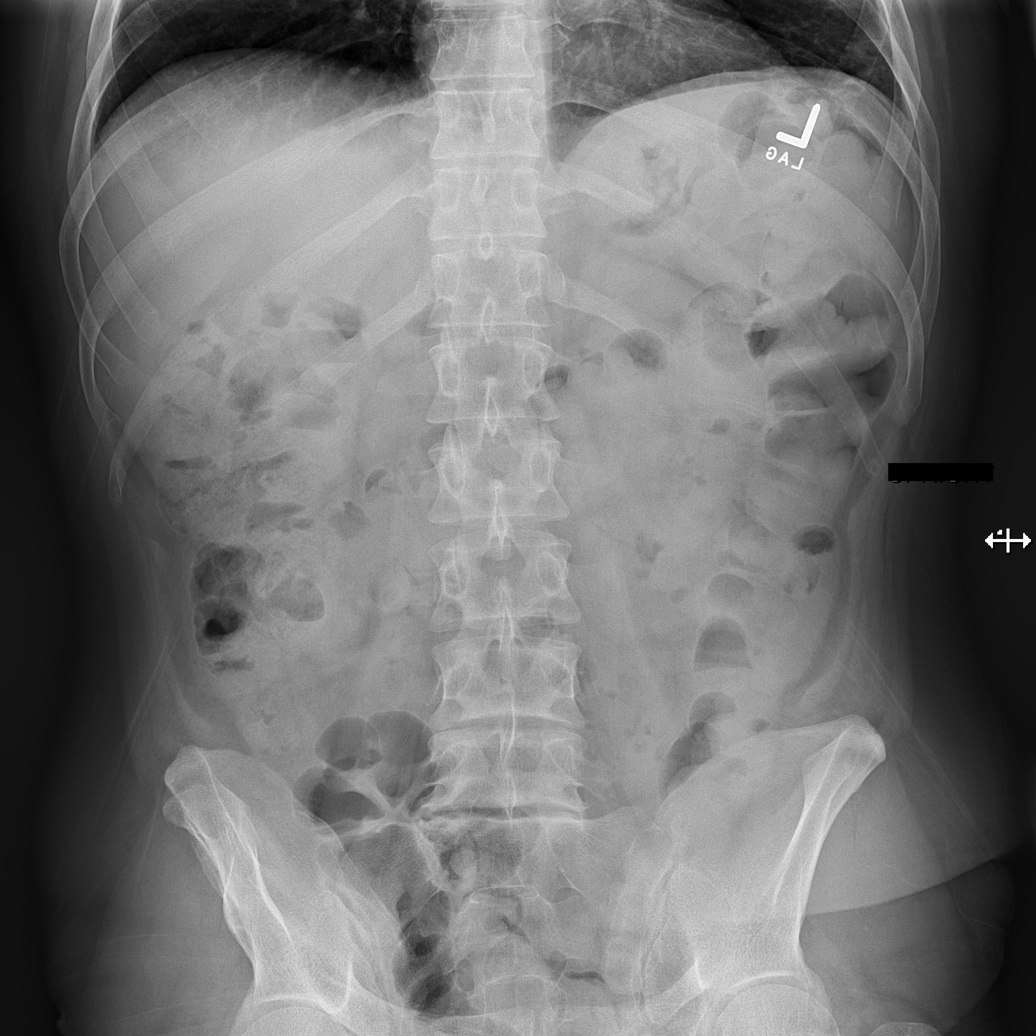

[t abdomen supine (1 of 2)]
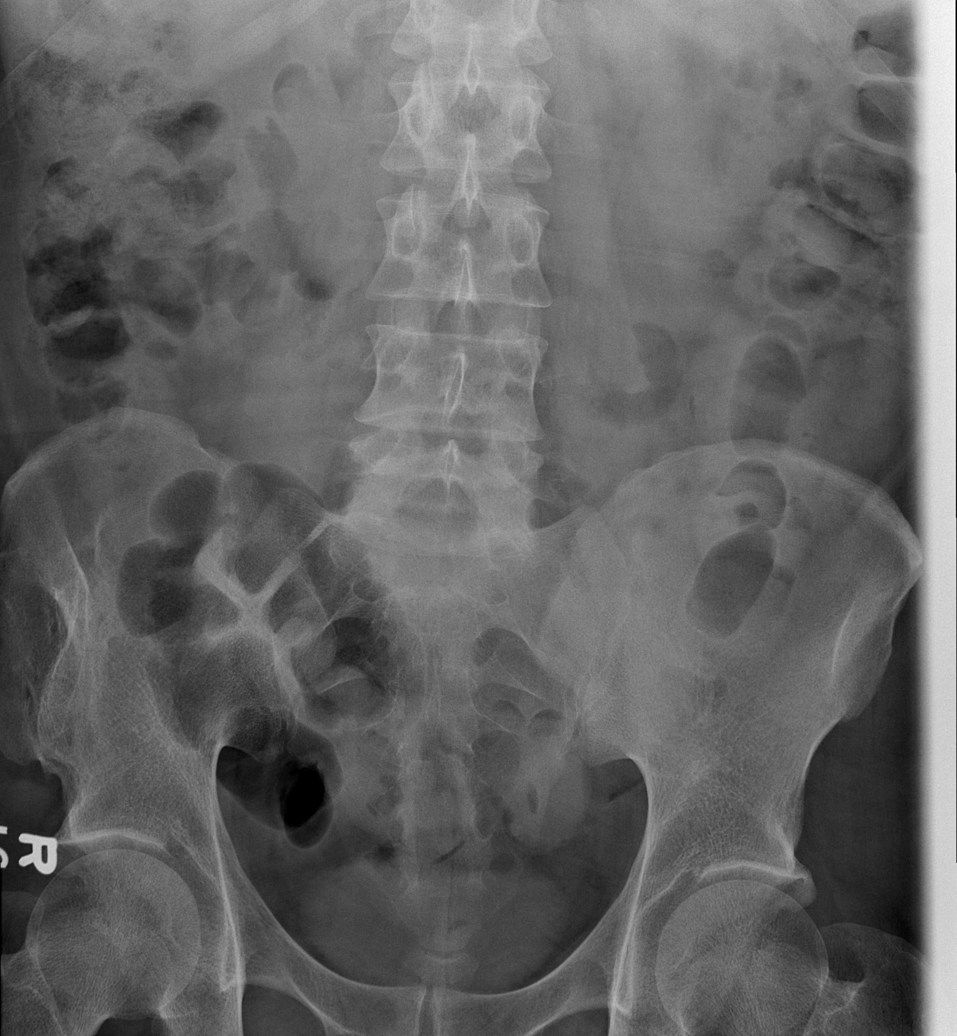

[t abdomen supine (2 of 2)]
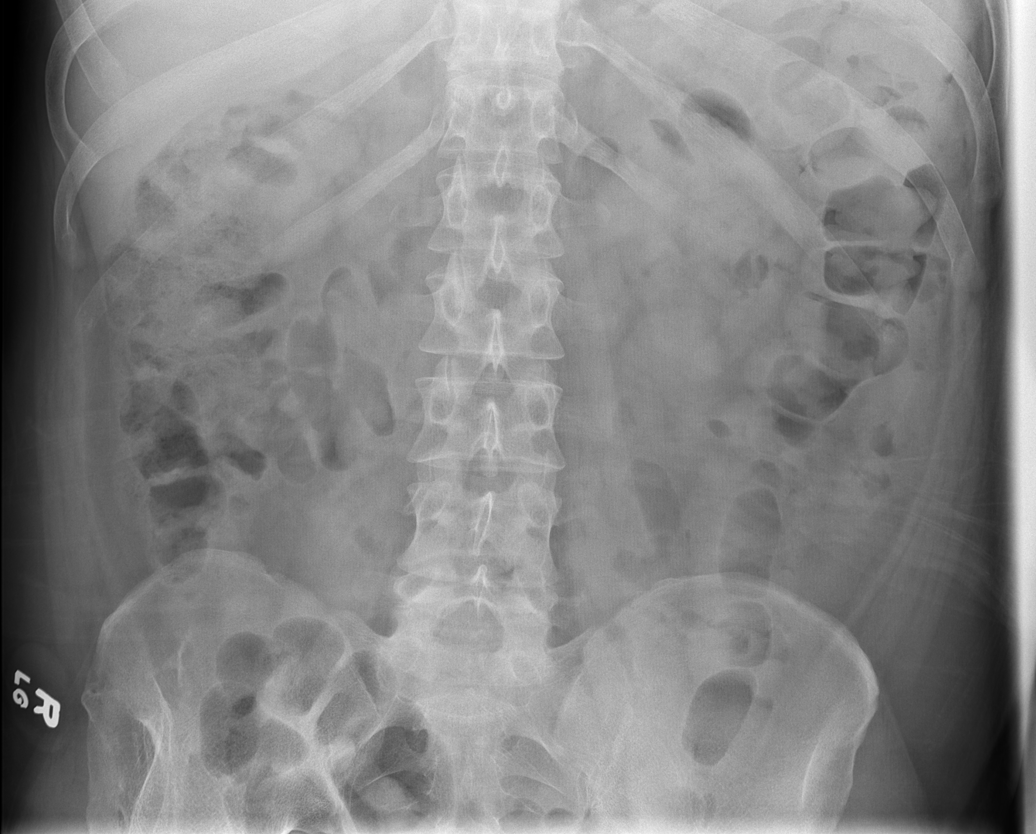

[4 of 4 positions shown; findings below may reference images not displayed]

FINDINGS: The lungs are well-aerated. Minimal left basilar atelectasis is
noted. There is no evidence of pleural effusion or pneumothorax. The
cardiomediastinal silhouette is within normal limits.

The visualized bowel gas pattern is unremarkable. Scattered stool
and air are seen within the colon; there is no evidence of small
bowel dilatation to suggest obstruction. No free intra-abdominal air
is identified on the provided upright view.

No acute osseous abnormalities are seen; the sacroiliac joints are
unremarkable in appearance.
IMPRESSION: 1. Unremarkable bowel gas pattern; no free intra-abdominal air seen.
Moderate amount of stool noted in the colon.
2. Minimal left basilar atelectasis noted.  Lungs otherwise clear.

## 2018-07-19 MED FILL — SERTRALINE HCL 100 MG TAB: 100 | 30 days supply | Qty: 30 | Fill #0

## 2018-07-19 MED FILL — DOXEPIN 25 MG CAPSULE: 25 | 30 days supply | Qty: 180 | Fill #0

## 2018-07-24 ENCOUNTER — Inpatient Hospital Stay (HOSPITAL_COMMUNITY)
Admission: AD | Admit: 2018-07-24 | Discharge: 2018-07-31 | DRG: 885 | Disposition: A | Discharge status: Home / Self Care | Payer: Federal, State, Local not specified - Other | Source: Intra-hospital | Attending: Psychiatry | Admitting: Psychiatry

## 2018-07-24 ENCOUNTER — Other Ambulatory Visit: Payer: Self-pay

## 2018-07-24 ENCOUNTER — Emergency Department (HOSPITAL_COMMUNITY)
Admission: EM | Admit: 2018-07-24 | Discharge: 2018-07-24 | Disposition: A | Discharge status: Other Acute Inpatient Hospital | Payer: Medicaid Other | Attending: Emergency Medicine | Admitting: Emergency Medicine

## 2018-07-24 ENCOUNTER — Encounter (HOSPITAL_COMMUNITY): Payer: Self-pay | Admitting: *Deleted

## 2018-07-24 DIAGNOSIS — F411 Generalized anxiety disorder: Secondary | ICD-10-CM | POA: Diagnosis present

## 2018-07-24 DIAGNOSIS — Z79899 Other long term (current) drug therapy: Secondary | ICD-10-CM | POA: Insufficient documentation

## 2018-07-24 DIAGNOSIS — F17210 Nicotine dependence, cigarettes, uncomplicated: Secondary | ICD-10-CM | POA: Diagnosis present

## 2018-07-24 DIAGNOSIS — R45851 Suicidal ideations: Secondary | ICD-10-CM | POA: Diagnosis present

## 2018-07-24 DIAGNOSIS — F1120 Opioid dependence, uncomplicated: Secondary | ICD-10-CM | POA: Diagnosis present

## 2018-07-24 DIAGNOSIS — F1410 Cocaine abuse, uncomplicated: Secondary | ICD-10-CM | POA: Insufficient documentation

## 2018-07-24 DIAGNOSIS — G4700 Insomnia, unspecified: Secondary | ICD-10-CM | POA: Diagnosis present

## 2018-07-24 DIAGNOSIS — G8929 Other chronic pain: Secondary | ICD-10-CM | POA: Diagnosis present

## 2018-07-24 DIAGNOSIS — F315 Bipolar disorder, current episode depressed, severe, with psychotic features: Principal | ICD-10-CM | POA: Diagnosis present

## 2018-07-24 DIAGNOSIS — F3130 Bipolar disorder, current episode depressed, mild or moderate severity, unspecified: Secondary | ICD-10-CM | POA: Diagnosis present

## 2018-07-24 DIAGNOSIS — Z590 Homelessness: Secondary | ICD-10-CM

## 2018-07-24 DIAGNOSIS — E785 Hyperlipidemia, unspecified: Secondary | ICD-10-CM | POA: Diagnosis present

## 2018-07-24 DIAGNOSIS — F314 Bipolar disorder, current episode depressed, severe, without psychotic features: Secondary | ICD-10-CM | POA: Insufficient documentation

## 2018-07-24 DIAGNOSIS — F1420 Cocaine dependence, uncomplicated: Secondary | ICD-10-CM

## 2018-07-24 DIAGNOSIS — Z634 Disappearance and death of family member: Secondary | ICD-10-CM | POA: Insufficient documentation

## 2018-07-24 DIAGNOSIS — F419 Anxiety disorder, unspecified: Secondary | ICD-10-CM | POA: Diagnosis not present

## 2018-07-24 DIAGNOSIS — Z818 Family history of other mental and behavioral disorders: Secondary | ICD-10-CM

## 2018-07-24 LAB — RAPID URINE DRUG SCREEN, HOSP PERFORMED
AMPHETAMINES: NOT DETECTED
Barbiturates: NOT DETECTED
Benzodiazepines: NOT DETECTED
Cocaine: POSITIVE — AB
OPIATES: NOT DETECTED
TETRAHYDROCANNABINOL: NOT DETECTED

## 2018-07-24 LAB — COMPREHENSIVE METABOLIC PANEL
ALBUMIN: 3.8 g/dL (ref 3.5–5.0)
ALK PHOS: 61 U/L (ref 38–126)
ALT: 33 U/L (ref 0–44)
AST: 31 U/L (ref 15–41)
Anion gap: 7 (ref 5–15)
BILIRUBIN TOTAL: 0.4 mg/dL (ref 0.3–1.2)
BUN: 5 mg/dL — AB (ref 6–20)
CALCIUM: 9.2 mg/dL (ref 8.9–10.3)
CO2: 24 mmol/L (ref 22–32)
CREATININE: 0.9 mg/dL (ref 0.61–1.24)
Chloride: 107 mmol/L (ref 98–111)
GFR calc Af Amer: >60 mL/min (ref >60)
GFR calc non Af Amer: >60 mL/min (ref >60)
GLUCOSE: 115 mg/dL — AB (ref 70–99)
Potassium: 3.8 mmol/L (ref 3.5–5.1)
SODIUM: 138 mmol/L (ref 135–145)
TOTAL PROTEIN: 6.9 g/dL (ref 6.5–8.1)

## 2018-07-24 LAB — CBC
HEMATOCRIT: 40.9 % (ref 39.0–52.0)
HEMOGLOBIN: 12.6 g/dL — AB (ref 13.0–17.0)
MCH: 28.1 pg (ref 26.0–34.0)
MCHC: 30.8 g/dL (ref 30.0–36.0)
MCV: 91.3 fL (ref 80.0–100.0)
Platelets: 278 10*3/uL (ref 150–400)
RBC: 4.48 MIL/uL (ref 4.22–5.81)
RDW: 14.5 % (ref 11.5–15.5)
WBC: 10.4 10*3/uL (ref 4.0–10.5)
nRBC: 0 % (ref 0.0–0.2)

## 2018-07-24 LAB — ACETAMINOPHEN LEVEL

## 2018-07-24 LAB — SALICYLATE LEVEL: Salicylate Lvl: <7 mg/dL (ref 2.8–30.0)

## 2018-07-24 LAB — ETHANOL: Alcohol, Ethyl (B): <10 mg/dL (ref <10)

## 2018-07-24 MED ORDER — NICOTINE 21 MG/24HR TD PT24
21.0000 mg | MEDICATED_PATCH | Freq: Once | TRANSDERMAL | Status: DC
  Administered 2018-07-24: 21 mg via TRANSDERMAL
  Filled 2018-07-24: qty 1

## 2018-07-24 MED ORDER — ALUM & MAG HYDROXIDE-SIMETH 200-200-20 MG/5ML PO SUSP: 30.0000 mL | ORAL | Status: DC | PRN

## 2018-07-24 MED ORDER — METHADONE HCL 10 MG PO TABS
110.0000 mg | ORAL_TABLET | Freq: Every day | ORAL | Status: DC
  Administered 2018-07-25 – 2018-07-31 (×7): 110 mg via ORAL
  Filled 2018-07-24 (×7): qty 11

## 2018-07-24 MED ORDER — NICOTINE POLACRILEX 2 MG MT GUM
2.0000 mg | CHEWING_GUM | OROMUCOSAL | Status: DC | PRN
  Administered 2018-07-24 – 2018-07-30 (×21): 2 mg via ORAL
  Filled 2018-07-24: qty 1
  Filled 2018-07-24 (×2): qty 10
  Filled 2018-07-24 (×5): qty 1

## 2018-07-24 MED ORDER — DOXEPIN HCL 75 MG PO CAPS
150.0000 mg | ORAL_CAPSULE | Freq: Every day | ORAL | Status: DC
  Administered 2018-07-24: 150 mg via ORAL
  Filled 2018-07-24: qty 2
  Filled 2018-07-24: qty 6
  Filled 2018-07-24: qty 2

## 2018-07-24 MED ORDER — MAGNESIUM HYDROXIDE 400 MG/5ML PO SUSP: 30.0000 mL | Freq: Every day | ORAL | Status: DC | PRN

## 2018-07-24 MED ORDER — ACETAMINOPHEN 325 MG PO TABS
650.0000 mg | ORAL_TABLET | Freq: Four times a day (QID) | ORAL | Status: DC | PRN
  Administered 2018-07-30: 650 mg via ORAL
  Filled 2018-07-24: qty 2

## 2018-07-24 MED ORDER — NICOTINE 21 MG/24HR TD PT24
21.0000 mg | MEDICATED_PATCH | Freq: Once | TRANSDERMAL | Status: DC
  Filled 2018-07-24: qty 1

## 2018-07-24 MED ORDER — TRAZODONE HCL 50 MG PO TABS: 50.0000 mg | ORAL_TABLET | Freq: Every evening | ORAL | Status: DC | PRN

## 2018-07-24 MED ORDER — METHADONE HCL 10 MG PO TABS
110.0000 mg | ORAL_TABLET | Freq: Every day | ORAL | Status: DC
  Administered 2018-07-24: 110 mg via ORAL
  Filled 2018-07-24: qty 11

## 2018-07-24 MED ORDER — METHADONE HCL 10 MG/ML PO CONC: 110.0000 mg | Freq: Every day | ORAL | Status: DC

## 2018-07-24 NOTE — ED Notes (Signed)
Pelam was called [id number]

## 2018-07-24 NOTE — ED Notes (Signed)
Pt eating turkey tray and tolerating well. 

## 2018-07-24 NOTE — BHH Group Notes (Signed)
Adult Psychoeducational Group Note  Date:  07/24/2018  Time: 4:00 PM  Group Topic/Focus: Mindfulness The purpose of this group is to educate about the physiologic effects of anxiety on the body and the benefits of mindfulness to decrease anxiety. Patients were provided techniques to practice grounding and mindfulness.  Participation Level:  Active  Participation Quality:  Appropriate and Attentive  Affect:  Appropriate  Cognitive:  Alert and Oriented  Insight: Improving  Engagement in Group:  Developing/Improving  Modes of Intervention:  Discussion and Education  Additional Comments:  Patient was attentive and appropriate throughout group.  Marchelle Folks A Kelcy Baeten 07/24/2018 5:00 PM

## 2018-07-24 NOTE — ED Notes (Signed)
Regular Diet was ordered for Breakfast. 

## 2018-07-24 NOTE — Progress Notes (Signed)
Adult Psychoeducational Group Note  Date:  07/24/2018 Time:  10:02 PM  Group Topic/Focus:  Wrap-Up Group:   The focus of this group is to help patients review their daily goal of treatment and discuss progress on daily workbooks.  Participation Level:  None  Participation Quality:  Inattentive  Affect:  Lethargic  Cognitive:  Lacking  Insight: None  Engagement in Group:  None  Modes of Intervention:  Discussion  Additional Comments:  Pt fell asleep during group and did not contribute at all.  Kimberlee Shoun 07/24/2018, 10:02 PM

## 2018-07-24 NOTE — ED Notes (Signed)
ED Provider at bedside. 

## 2018-07-24 NOTE — ED Notes (Signed)
Regular Diet was ordered for lunch. 

## 2018-07-24 NOTE — Tx Team (Signed)
Initial Treatment Plan 07/24/2018 6:44 PM Abran Duke ZOX:[id number]    PATIENT STRESSORS: Loss of mother a year ago Substance abuse   PATIENT STRENGTHS: Ability for insight Average or above average intelligence Capable of independent living Communication skills General fund of knowledge Motivation for treatment/growth   PATIENT IDENTIFIED PROBLEMS: Suicidal ideation (I was just tired of life)  depression  anxiety                 DISCHARGE CRITERIA:  Ability to meet basic life and health needs Improved stabilization in mood, thinking, and/or behavior Motivation to continue treatment in a less acute level of care Need for constant or close observation no longer present Reduction of life-threatening or endangering symptoms to within safe limits Verbal commitment to aftercare and medication compliance  PRELIMINARY DISCHARGE PLAN: Outpatient therapy Return to previous living arrangement Return to previous work or school arrangements  PATIENT/FAMILY INVOLVEMENT: This treatment plan has been presented to and reviewed with the patient, SABINO DENNING, and/or family member.  The patient and family have been given the opportunity to ask questions and make suggestions.  Hoover Browns, RN 07/24/2018, 6:44 PM

## 2018-07-24 NOTE — Progress Notes (Signed)
D: Pt was in dayroom upon initial approach.  Pt presents with depressed affect and mood.  He reports feeling "tired" and his goal is to "get some rest."  Pt denies HI, denies hallucinations, denies pain.  He requests Doxepin for sleep, stating "I won't be able to sleep without it."  He reports SI without a plan.  Pt verbally contracts for safety.  Pt has been visible in milieu interacting with peers and staff appropriately.  Pt attended evening group.    A: Introduced self to pt.  Met with pt 1:1.  Actively listened to pt and offered support and encouragement. On-site provider notified of pt's request.  Medication administered per order.  Fall prevention techniques reviewed with pt and he verbalized understanding.  Q15 minute safety checks maintained.  R: Pt is safe on the unit.  Pt is compliant with medication.  Pt verbally contracts for safety.  Will continue to monitor and assess.
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
## 2018-07-24 NOTE — ED Notes (Signed)
Patient stated that he was originally on his way to the Methadone clinic when his friend dropped him off here. Has requested his methadone dose multiple times. Advised the patient that he would need to speak to provider.
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
## 2018-07-24 NOTE — Progress Notes (Signed)
Pt accepted to [facility], Bed 301-2  Fransisca Kaufmann, NP is the accepting provider.  Nehemiah Massed, MD is the attending provider.  Call report to [phone]  [facility] Psych ED notified.   Pt is Voluntary.  Pt may be transported by [facility]  Pt scheduled  to arrive at [facility] between 14:00-14:30  Carney Bern T. Kaylyn Lim, MSW, LCSWA Disposition Clinical Social Work [phone] (cell) [phone] (office)

## 2018-07-24 NOTE — ED Notes (Signed)
Methadone dose confirmed with pharmacy.

## 2018-07-24 NOTE — ED Triage Notes (Signed)
Patient states that he has been having suicidal thoughts for one week.

## 2018-07-25 DIAGNOSIS — F315 Bipolar disorder, current episode depressed, severe, with psychotic features: Principal | ICD-10-CM

## 2018-07-25 DIAGNOSIS — F1420 Cocaine dependence, uncomplicated: Secondary | ICD-10-CM

## 2018-07-25 MED ORDER — ATORVASTATIN CALCIUM 20 MG PO TABS
20.0000 mg | ORAL_TABLET | Freq: Every day | ORAL | Status: DC
  Administered 2018-07-25 – 2018-07-30 (×6): 20 mg via ORAL
  Filled 2018-07-25 (×2): qty 1
  Filled 2018-07-25: qty 7
  Filled 2018-07-25 (×5): qty 1

## 2018-07-25 MED ORDER — ENSURE ENLIVE PO LIQD
237.0000 mL | Freq: Two times a day (BID) | ORAL | Status: DC
  Administered 2018-07-25 – 2018-07-31 (×13): 237 mL via ORAL

## 2018-07-25 MED ORDER — QUETIAPINE FUMARATE 50 MG PO TABS
150.0000 mg | ORAL_TABLET | Freq: Every day | ORAL | Status: DC
  Administered 2018-07-25: 150 mg via ORAL
  Filled 2018-07-25 (×2): qty 3

## 2018-07-25 MED ORDER — HYDROXYZINE HCL 25 MG PO TABS
25.0000 mg | ORAL_TABLET | Freq: Four times a day (QID) | ORAL | Status: DC | PRN
  Administered 2018-07-25 – 2018-07-26 (×2): 25 mg via ORAL
  Filled 2018-07-25 (×2): qty 1

## 2018-07-25 MED ORDER — GABAPENTIN 300 MG PO CAPS
300.0000 mg | ORAL_CAPSULE | Freq: Three times a day (TID) | ORAL | Status: DC
  Administered 2018-07-25 – 2018-07-27 (×7): 300 mg via ORAL
  Filled 2018-07-25 (×10): qty 1

## 2018-07-25 MED ORDER — SERTRALINE HCL 50 MG PO TABS
50.0000 mg | ORAL_TABLET | Freq: Every day | ORAL | Status: DC
  Administered 2018-07-25 – 2018-07-26 (×2): 50 mg via ORAL
  Filled 2018-07-25 (×4): qty 1

## 2018-07-25 NOTE — Progress Notes (Signed)
D: Pt alert and oriented on the unit. Pt engaging with RN staff and other pts. Pt denies HI, and endorses passive SI, denies A/VH. Pt is pleasant and cooperative. A: Education, support and encouragement provided, q15 minute safety checks remain in effect. Medications administered per MD orders. R: No reactions/side effects to medicine noted. Pt denies any concerns at this time, and verbally contracts for safety. Pt ambulating on the unit with no issues. Pt remains safe on and off the unit.

## 2018-07-25 NOTE — Progress Notes (Signed)
Recreation Therapy Notes  Animal-Assisted Activity (AAA) Program Checklist/Progress Notes Patient Eligibility Criteria Checklist & Daily Group note for Rec Tx Intervention  Date: 11.5.19 Time: 1430 Location: [address]   AAA/T Program Assumption of Risk Form signed by Engineer, production or Parent Legal Guardian YES   Patient is free of allergies or sever asthma  YES   Patient reports no fear of animals  YES   Patient reports no history of cruelty to animals YES   Patient understands his/her participation is voluntary YES   Patient washes hands before animal contact  YES   Patient washes hands after animal contact  YES   Education: Charity fundraiser, Appropriate Animal Interaction   Education Outcome: Acknowledges understanding/In group clarification offered/Needs additional education.   Clinical Observations/Feedback: Pt did not attend group activity.    Caroll Rancher, LRT/CTRS         Caroll Rancher A 07/25/2018 3:56 PM

## 2018-07-25 NOTE — Progress Notes (Signed)
Adult Psychoeducational Group Note  Date:  07/25/2018 Time:  9:30 PM  Group Topic/Focus:  Wrap-Up Group:   The focus of this group is to help patients review their daily goal of treatment and discuss progress on daily workbooks.  Participation Level:  Active  Participation Quality:  Appropriate  Affect:  Appropriate  Cognitive:  Appropriate  Insight: Appropriate  Engagement in Group:  Engaged  Modes of Intervention:  Discussion  Additional Comments:  Patient attended group and said that his day was a 8. His positive word for today is humidity.   Trevor [name] 07/25/2018, 9:30 PM

## 2018-07-25 NOTE — [facility] Counselor (Signed)
Adult Comprehensive Assessment  Patient ID: Trevor [name], male   DOB: 09-Nov-1980, 37 y.o.   MRN: [id number]  Information Source: Information source: Patient  Current Stressors:  Patient states their primary concerns and needs for treatment are:: mood lability, cocaine abuse, bipolar disorder-uncontrolled due to medication noncompliance/difficulty affording medication Patient states their goals for this hospitilization and ongoing recovery are:: "to get my medications adjusted and learn better skills to cope with my mood. To learn ways to stop abusing cocaine."  Educational / Learning stressors: High school graduate Employment / Job issues: unemployed "I'm trying to get disability."  Family Relationships: "my mom died last year. She was my rock." Pt denies any other family support. "When mom died, they all abandoned me."  Financial / Lack of resources (include bankruptcy): assistance from [employer] for rent; [employer] / Lack of housing: Holiday representative assisted patient with renting home Physical health (include injuries & life threatening diseases): high cholesterol managed with medication; "I used to use meth alot and I messed up my brain." Social relationships: poor-"I have 2 close freinds. " single and no family support Substance abuse: cocaine--one gram daily; 1pk cigarettes daily. recent meth abuse --last use one month ago-IV. pt gets [facility]adone mainteance at [facility] 110mg  daily  Bereavement / Loss: mother died last year. "She was my rock. Things really have been bad for me since she died."   Living/Environment/Situation:  Living Arrangements: Alone Living conditions (as described by patient or guardian): lives in home rented for him by the [employer] Who else lives in the home?: alone How long has patient lived in current situation?: few months  What is atmosphere in current home: Comfortable  Family History:  Marital status: Single Are you sexually active?: No What is  your sexual orientation?: heterosexual Has your sexual activity been affected by drugs, alcohol, medication, or emotional stress?: n/a  Does patient have children?: No  Childhood History:  By whom was/is the patient raised?: Mother Additional childhood history information: mother was primary caretaker.  Description of patient's relationship with caregiver when they were a child: close to mother; no relationship with father. "my mother was very depressed when I was younger."  Patient's description of current relationship with people who raised him/her: mother died last year. pt having a very hard time since then.  How were you disciplined when you got in trouble as a child/adolescent?: "I was pretty good."  Does patient have siblings?: No Did patient suffer any verbal/emotional/physical/sexual abuse as a child?: No Did patient suffer from severe childhood neglect?: No Has patient ever been sexually abused/assaulted/raped as an adolescent or adult?: No Was the patient ever a victim of a crime or a disaster?: No Witnessed domestic violence?: No Has patient been effected by domestic violence as an adult?: No  Education:  Highest grade of school patient has completed: high school graduate Currently a Consulting civil engineer?: No Learning disability?: No  Employment/Work Situation:   Employment situation: Unemployed Patient's job has been impacted by current illness: Yes Describe how patient's job has been impacted: "I can't work because my mood is completely out of control." What is the longest time patient has a held a job?: "I dont recall." Where was the patient employed at that time?: n/a  Did You Receive Any Psychiatric Treatment/Services While in the [employer]?: (n/a) Are There Guns or Other Weapons in Your Home?: No Are These Weapons Safely Secured?: (n/a)  Financial Resources:   Financial resources: Food stamps([employer] pays his rent) Does patient have  a representative payee or guardian?:  No  Alcohol/Substance Abuse:   What has been your use of drugs/alcohol within the last 12 months?: cocaine abuse 1 gram daily; 1pk cigarettes daily; pt reports recent history of IV meth abuse (until about one month ago). pt on methadone maintenance program through [facility]. 110mg  daily.  If attempted suicide, did drugs/alcohol play a role in this?: No(increased thoughts at times) Alcohol/Substance Abuse Treatment Hx: Past Tx, Inpatient, Past Tx, Outpatient, Attends AA/NA If yes, describe treatment: [facility] for methadone maintenance[facility] last year after his mother's death.  Has alcohol/substance abuse ever caused legal problems?: Yes(history of Breaking and entering and theft, all drug related. spent a total of about 3 months in jail)  Social Support System:   Patient's Community Support System: Poor Describe Community Support System: 2 close friends; "My family disowned me when my mom did."  Type of faith/religion: "I'm spiritual." How does patient's faith help to cope with current illness?: "not really."   Leisure/Recreation:   Leisure and Hobbies: "haven't been able to enjoy much due to my mood."   Strengths/Needs:   What is the patient's perception of their strengths?: "I want to get sober and get my mood straightened out."  Patient states they can use these personal strengths during their treatment to contribute to their recovery: pt open to trying new medication, working with treatment team to establish appropriate aftercare.  Patient states these barriers may affect/interfere with their treatment: none identified Patient states these barriers may affect their return to the community: "I have a hard time paying for my mental health mediction." CSW will work on helping pt apply for orange card.  Other important information patient would like considered in planning for their treatment: none identified.   Discharge Plan:   Currently receiving community mental health services: Yes (From  Whom)([facility] for methadone maintenance ) Patient states concerns and preferences for aftercare planning are: none identified Patient states they will know when they are safe and ready for discharge when: "when my mood is regular." Does patient have access to transportation?: Yes(bus) Does patient have financial barriers related to discharge medications?: Yes Patient description of barriers related to discharge medications: no income or insurance. "my methadone is free but I need help paying for my other meds." (Orange card possibly).  Will patient be returning to same living situation after discharge?: Yes(pt plans to return home)  Summary/Recommendations:   Summary and Recommendations (to be completed by the evaluator): Patient is 37yo male living in [place], [place]) alone. Pt presents to the hospital seeking treatment for mood lability, depression, anxiety, SI thoughts, cocaine abuse, and for medication stabilization. Pt is single, unemployed, with no children, and reports that his biggest stressor invovles a lack of social supports. Pt's mother passed away last year, which has been especially hard on patient. PT reports that he has been abusing cocaine, with a recent history of meth abuse. Pt is currently going to [facility] in [place] for methadone maintenance and would like to resume this service at discharge. Pt has a primary diagnosis of Bipolar Disorder. Currently, he denies SI/HI/AVH. Recommendations for pt include: crisis stabilization, therapeutic milieu, encourage group attendance and participation, medication management for mood stabilization, and development of comrpehensive mental wellness/sobriety plan. CSW assessing for appropriate referrals.   Rona Ravens LCSW 07/25/2018 12:32 PM

## 2018-07-25 NOTE — [facility] Suicide Risk Assessment (Signed)
[facility] Admission Suicide Risk Assessment   Nursing information obtained from:  Patient Demographic factors:  Male, Caucasian, Low socioeconomic status, Living alone Current Mental Status:  NA Loss Factors:  Loss of significant relationship Historical Factors:  Prior suicide attempts, Family history of mental illness or substance abuse Risk Reduction Factors:  Religious beliefs about death  Total Time spent with patient: 1 hour Principal Problem: Bipolar I disorder, current or most recent episode depressed, with psychotic features (HCC) Diagnosis:   Patient Active Problem List   Diagnosis Date Noted  . Cocaine use disorder, severe, dependence (HCC) [F14.20] 07/25/2018  . Bipolar I disorder, current or most recent episode depressed, with psychotic features (HCC) [F31.5] 07/24/2018   Subjective Data: see H&P  Continued Clinical Symptoms:  Alcohol Use Disorder Identification Test Final Score (AUDIT): 0 The "Alcohol Use Disorders Identification Test", Guidelines for Use in Primary Care, Second Edition.  World Science writer [facility]). Score between 0-7:  no or low risk or alcohol related problems. Score between 8-15:  moderate risk of alcohol related problems. Score between 16-19:  high risk of alcohol related problems. Score 20 or above:  warrants further diagnostic evaluation for alcohol dependence and treatment.   Psychiatric Specialty Exam: Physical Exam  Nursing note and vitals reviewed.     Blood pressure 102/68, pulse 70, temperature 97.9 F (36.6 C), temperature source Oral, resp. rate 20, height 6' (1.829 m), weight 83.5 kg.Body mass index is 24.95 kg/m.     COGNITIVE FEATURES THAT CONTRIBUTE TO RISK:  None    SUICIDE RISK:   Moderate:  Frequent suicidal ideation with limited intensity, and duration, some specificity in terms of plans, no associated intent, good self-control, limited dysphoria/symptomatology, some risk factors present, and identifiable protective  factors, including available and accessible social support.  PLAN OF CARE: see H&P  I certify that inpatient services furnished can reasonably be expected to improve the patient's condition.   Micheal Likens, MD 07/25/2018, 1:41 PM

## 2018-07-25 NOTE — Progress Notes (Signed)
D: Pt was in dayroom upon initial approach.  Pt presents with anxious, depressed affect and mood.  He describes his day as "alright" and reports goal is to "stay positive all day."  Pt denies SI/HI, denies hallucinations, denies pain.  Pt has been visible in milieu interacting with peers and staff appropriately.  Pt attended evening group.    A: Met with pt 1:1.  Actively listened to pt and offered support and encouragement. Medication administered per order.  PRN medication administered for anxiety.  Q15 minute safety checks maintained.  R: Pt is safe on the unit.  Pt is compliant with medications.  Pt verbally contracts for safety.  Will continue to monitor and assess.

## 2018-07-25 NOTE — Plan of Care (Signed)
?  Problem: Education: ?Goal: Mental status will improve ?Outcome: Progressing ?Goal: Verbalization of understanding the information provided will improve ?Outcome: Progressing ?  ?

## 2018-07-25 NOTE — [facility] Suicide Risk Assessment (Signed)
[facility] INPATIENT:  Family/Significant Other Suicide Prevention Education  Suicide Prevention Education:  Patient Refusal for Family/Significant Other Suicide Prevention Education: The patient Trevor [name] has refused to provide written consent for family/significant other to be provided Family/Significant Other Suicide Prevention Education during admission and/or prior to discharge.  Physician notified.  SPE completed with pt, as pt refused to consent to family contact. SPI pamphlet provided to pt and pt was encouraged to share information with support network, ask questions, and talk about any concerns relating to SPE. Pt denies access to guns/firearms and verbalized understanding of information provided. Mobile Crisis information also provided to pt.   Rona Ravens LCSW 07/25/2018, 12:33 PM

## 2018-07-25 NOTE — [facility] Group Notes (Signed)
[facility] Group Therapy 07/25/2018 1:15pm  Type of Therapy: Mental Health Association Presentation  Participation Level: Active  Participation Quality: Attentive  Affect: Appropriate  Cognitive: Oriented  Insight: Developing/Improving  Engagement in Therapy: Engaged  Modes of Intervention: Discussion, Education and Socialization  Summary of Progress/Problems: Mental Health Association (MHA) Speaker came to talk about his personal journey with mental health. The pt processed ways by which to relate to the speaker. MHA speaker provided handouts and educational information pertaining to groups and services offered by the [facility]. Pt was engaged in speaker's presentation and was receptive to resources provided.    Rona Ravens, LCSW 07/25/2018 3:08 PM
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
## 2018-07-26 LAB — LIPID PANEL
CHOLESTEROL: 364 mg/dL — AB (ref 0–200)
HDL: 41 mg/dL (ref >40)
LDL CALC: UNDETERMINED mg/dL (ref 0–99)
TRIGLYCERIDES: 718 mg/dL — AB (ref <150)
Total CHOL/HDL Ratio: 8.9 RATIO
VLDL: UNDETERMINED mg/dL (ref 0–40)

## 2018-07-26 LAB — TSH: TSH: 2.62 u[IU]/mL (ref 0.350–4.500)

## 2018-07-26 LAB — HEMOGLOBIN A1C
Hgb A1c MFr Bld: 5.3 % (ref 4.8–5.6)
Mean Plasma Glucose: 105.41 mg/dL

## 2018-07-26 LAB — HIV ANTIBODY (ROUTINE TESTING W REFLEX): HIV SCREEN 4TH GENERATION: NONREACTIVE

## 2018-07-26 MED ORDER — OLANZAPINE 5 MG PO TABS
5.0000 mg | ORAL_TABLET | Freq: Every day | ORAL | Status: DC
  Administered 2018-07-26: 5 mg via ORAL
  Filled 2018-07-26 (×2): qty 1

## 2018-07-26 MED ORDER — HYDROXYZINE HCL 50 MG PO TABS
50.0000 mg | ORAL_TABLET | Freq: Four times a day (QID) | ORAL | Status: DC | PRN
  Administered 2018-07-26 – 2018-07-31 (×13): 50 mg via ORAL
  Filled 2018-07-26 (×6): qty 1
  Filled 2018-07-26: qty 10
  Filled 2018-07-26 (×8): qty 1

## 2018-07-26 MED ORDER — SERTRALINE HCL 100 MG PO TABS
100.0000 mg | ORAL_TABLET | Freq: Every day | ORAL | Status: DC
  Administered 2018-07-27 – 2018-07-31 (×5): 100 mg via ORAL
  Filled 2018-07-26: qty 7
  Filled 2018-07-26 (×7): qty 1

## 2018-07-26 MED ORDER — OLANZAPINE 10 MG PO TABS
10.0000 mg | ORAL_TABLET | Freq: Every day | ORAL | Status: DC
  Filled 2018-07-26: qty 1

## 2018-07-26 MED ORDER — IBUPROFEN 600 MG PO TABS
600.0000 mg | ORAL_TABLET | Freq: Four times a day (QID) | ORAL | Status: DC | PRN
  Administered 2018-07-26: 600 mg via ORAL
  Filled 2018-07-26: qty 1
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
## 2018-07-26 NOTE — Plan of Care (Signed)
  Problem: Activity: Goal: Interest or engagement in activities will improve Outcome: Progressing   Problem: Coping: Goal: Ability to verbalize frustrations and anger appropriately will improve Outcome: Progressing   D: Pt alert and oriented on the unit. Pt was talkative and engaged with RN staff and other pts. Pt denies SI/HI, A/VH. Pt stated that his goal for today is "to learn more in groups." Pt has been working on that goal by attending and participating in groups and unit activities." Pt is pleasant and cooperative. A: Education, support and encouragement provided, q15 minute safety checks remain in effect. Medications administered per MD orders. R: No reactions/side effects to medicine noted. Pt verbally contracts for safety and ambulating on the unit with no issues. Pt remains safe on and off the unit.
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
## 2018-07-26 NOTE — Tx Team (Signed)
Interdisciplinary Treatment and Diagnostic Plan Update  07/26/2018 Time of Session: 1308MV Trevor [name] MRN: [id number]  Principal Diagnosis: Bipolar I disorder, current or most recent episode depressed, with psychotic features [facility])  Secondary Diagnoses: Principal Problem:   Bipolar I disorder, current or most recent episode depressed, with psychotic features (HCC) Active Problems:   Cocaine use disorder, severe, dependence (HCC)   Current Medications:  Current Facility-Administered Medications  Medication Dose Route Frequency Provider Last Rate Last Dose  . acetaminophen (TYLENOL) tablet 650 mg  650 mg Oral Q6H PRN Thermon Leyland, NP      . alum & mag hydroxide-simeth (MAALOX/MYLANTA) 200-200-20 MG/5ML suspension 30 mL  30 mL Oral Q4H PRN Fransisca Kaufmann A, NP      . atorvastatin (LIPITOR) tablet 20 mg  20 mg Oral q1800 Micheal Likens, MD   20 mg at 07/25/18 1818  . feeding supplement (ENSURE ENLIVE) (ENSURE ENLIVE) liquid 237 mL  237 mL Oral BID BM Micheal Likens, MD   237 mL at 07/25/18 1312  . gabapentin (NEURONTIN) capsule 300 mg  300 mg Oral TID Micheal Likens, MD   300 mg at 07/26/18 0753  . hydrOXYzine (ATARAX/VISTARIL) tablet 50 mg  50 mg Oral Q6H PRN Micheal Likens, MD      . ibuprofen (ADVIL,MOTRIN) tablet 600 mg  600 mg Oral Q6H PRN Micheal Likens, MD      . magnesium hydroxide (MILK OF MAGNESIA) suspension 30 mL  30 mL Oral Daily PRN Thermon Leyland, NP      . methadone (DOLOPHINE) tablet 110 mg  110 mg Oral Daily Fransisca Kaufmann A, NP   110 mg at 07/26/18 0753  . nicotine polacrilex (NICORETTE) gum 2 mg  2 mg Oral PRN Cobos, Rockey Situ, MD   2 mg at 07/25/18 1618  . OLANZapine (ZYPREXA) tablet 10 mg  10 mg Oral QHS Jolyne Loa T, MD      . sertraline (ZOLOFT) tablet 50 mg  50 mg Oral Daily Micheal Likens, MD   50 mg at 07/26/18 0753  . traZODone (DESYREL) tablet 50 mg  50 mg Oral QHS PRN Thermon Leyland, NP        PTA Medications: Medications Prior to Admission  Medication Sig Dispense Refill Last Dose  . doxepin (SINEQUAN) 25 MG capsule Take 150 mg by mouth at bedtime.  1   . famotidine (PEPCID) 20 MG tablet Take 1 tablet (20 mg total) by mouth 2 (two) times daily. (Patient taking differently: Take 20 mg by mouth as needed for heartburn or indigestion. ) 30 tablet 0 couple months at as needed  . lidocaine (LIDODERM) 5 % Place 1 patch onto the skin daily. Remove & Discard patch within 12 hours or as directed by MD (Patient not taking: Reported on 07/24/2018) 30 patch 0 Not Taking at Unknown time  . METHADONE HCL PO Take 110 mg by mouth daily at 6 (six) AM.    07/23/2018 at 2841  . naproxen (NAPROSYN) 375 MG tablet Take 1 tablet (375 mg total) by mouth 2 (two) times daily. (Patient not taking: Reported on 07/24/2018) 20 tablet 0 Not Taking at Unknown time  . ondansetron (ZOFRAN ODT) 8 MG disintegrating tablet 8mg  ODT q4 hours prn nausea (Patient not taking: Reported on 12/14/2017) 4 tablet 0 Not Taking at Unknown time  . oxyCODONE-acetaminophen (PERCOCET/ROXICET) 5-325 MG tablet Take 1 tablet by mouth every 4 (four) hours as needed for severe pain. (Patient not taking:  Reported on 09/19/2017) 6 tablet 0 Not Taking at Unknown time  . sertraline (ZOLOFT) 100 MG tablet Take 100 mg by mouth at bedtime.    07/23/2018 at Unknown time    Patient Stressors: Loss of mother a year ago Substance abuse  Patient Strengths: Ability for insight Average or above average intelligence Capable of independent living Communication skills General fund of knowledge Motivation for treatment/growth  Treatment Modalities: Medication Management, Group therapy, Case management,  1 to 1 session with clinician, Psychoeducation, Recreational therapy.   Physician Treatment Plan for Primary Diagnosis: Bipolar I disorder, current or most recent episode depressed, with psychotic features (HCC) Long Term Goal(s): Improvement in  symptoms so as ready for discharge Improvement in symptoms so as ready for discharge   Short Term Goals: Ability to identify and develop effective coping behaviors will improve Ability to identify triggers associated with substance abuse/mental health issues will improve  Medication Management: Evaluate patient's response, side effects, and tolerance of medication regimen.  Therapeutic Interventions: 1 to 1 sessions, Unit Group sessions and Medication administration.  Evaluation of Outcomes: Progressing  Physician Treatment Plan for Secondary Diagnosis: Principal Problem:   Bipolar I disorder, current or most recent episode depressed, with psychotic features (HCC) Active Problems:   Cocaine use disorder, severe, dependence (HCC)  Long Term Goal(s): Improvement in symptoms so as ready for discharge Improvement in symptoms so as ready for discharge   Short Term Goals: Ability to identify and develop effective coping behaviors will improve Ability to identify triggers associated with substance abuse/mental health issues will improve     Medication Management: Evaluate patient's response, side effects, and tolerance of medication regimen.  Therapeutic Interventions: 1 to 1 sessions, Unit Group sessions and Medication administration.  Evaluation of Outcomes: Progressing   RN Treatment Plan for Primary Diagnosis: Bipolar I disorder, current or most recent episode depressed, with psychotic features (HCC) Long Term Goal(s): Knowledge of disease and therapeutic regimen to maintain health will improve  Short Term Goals: Ability to remain free from injury will improve, Ability to disclose and discuss suicidal ideas and Ability to identify and develop effective coping behaviors will improve  Medication Management: RN will administer medications as ordered by provider, will assess and evaluate patient's response and provide education to patient for prescribed medication. RN will report any  adverse and/or side effects to prescribing provider.  Therapeutic Interventions: 1 on 1 counseling sessions, Psychoeducation, Medication administration, Evaluate responses to treatment, Monitor vital signs and CBGs as ordered, Perform/monitor CIWA, COWS, AIMS and Fall Risk screenings as ordered, Perform wound care treatments as ordered.  Evaluation of Outcomes: Progressing   LCSW Treatment Plan for Primary Diagnosis: Bipolar I disorder, current or most recent episode depressed, with psychotic features (HCC) Long Term Goal(s): Safe transition to appropriate next level of care at discharge, Engage patient in therapeutic group addressing interpersonal concerns.  Short Term Goals: Engage patient in aftercare planning with referrals and resources, Facilitate patient progression through stages of change regarding substance use diagnoses and concerns and Identify triggers associated with mental health/substance abuse issues  Therapeutic Interventions: Assess for all discharge needs, 1 to 1 time with Social worker, Explore available resources and support systems, Assess for adequacy in community support network, Educate family and significant other(s) on suicide prevention, Complete Psychosocial Assessment, Interpersonal group therapy.  Evaluation of Outcomes: Progressing   Progress in Treatment: Attending groups: Yes. Participating in groups: Yes. Taking medication as prescribed: Yes. Toleration medication: Yes. Family/Significant other contact made: SPE completed with pt; pt  declined to consent to collateral contact.  Patient understands diagnosis: Yes. Discussing patient identified problems/goals with staff: Yes. Medical problems stabilized or resolved: Yes. Denies suicidal/homicidal ideation: Yes. Issues/concerns per patient self-inventory: No. Other: n/a  New problem(s) identified: No, Describe:  n/a  New Short Term/Long Term Goal(s): detox, medication management for mood stabilization;  elimination of SI thoughts; development of comprehensive mental wellness/sobriety plan.   Patient Goals:  "I need my medications adjusted and help with my depression and cocaine use."   Discharge Plan or Barriers: CSW assessing. Pt plans to return home and resume services through [facility] for methadone maintenance. Pt requesting help with paying for psychiatric drugs--orange card application provided and assistance provided to help him complete. [facility] pamphlet, Mobile Crisis information, and AA/NA information provided to patient for additional community support and resources.   Reason for Continuation of Hospitalization: Anxiety Depression Medication stabilization Suicidal ideation Withdrawal symptoms  Estimated Length of Stay: Friday, 07/28/18  Attendees: Patient: 07/26/2018 9:16 AM  Physician: Dr. Altamese Craigmont MD 07/26/2018 9:16 AM  Nursing: Venda Rodes; [place] RN 07/26/2018 9:16 AM  RN Care Manager:x 07/26/2018 9:16 AM  Social Worker: Corrie Mckusick LCSW 07/26/2018 9:16 AM  Recreational Therapist: x 07/26/2018 9:16 AM  Other: Armandina Stammer NP 07/26/2018 9:16 AM  Other:  07/26/2018 9:16 AM  Other: 07/26/2018 9:16 AM    Scribe for Treatment Team: Rona Ravens, LCSW 07/26/2018 9:16 AM

## 2018-07-26 NOTE — Progress Notes (Signed)
Adult Psychoeducational Group Note  Date:  07/26/2018 Time:  6:17 PM  Group Topic/Focus:  Goals Group:   The focus of this group is to help patients establish daily goals to achieve during treatment and discuss how the patient can incorporate goal setting into their daily lives to aide in recovery.  Participation Level:  Active  Participation Quality:  Attentive  Affect:  Appropriate  Cognitive:  Alert  Insight: Appropriate  Engagement in Group:  Engaged  Modes of Intervention:  Discussion  Additional Comments:  Pt attended group and participated in group discussion.  Rori Goar R Barbie Croston 07/26/2018, 6:17 PM

## 2018-07-26 NOTE — [facility] Group Notes (Signed)
LCSW Group Therapy Note   07/26/2018 1:15pm   Type of Therapy and Topic:  Group Therapy:  Overcoming Obstacles   Participation Level:  Did Not Attend--pt invited. Chose to remain in bed.    Description of Group:    In this group patients will be encouraged to explore what they see as obstacles to their own wellness and recovery. They will be guided to discuss their thoughts, feelings, and behaviors related to these obstacles. The group will process together ways to cope with barriers, with attention given to specific choices patients can make. Each patient will be challenged to identify changes they are motivated to make in order to overcome their obstacles. This group will be process-oriented, with patients participating in exploration of their own experiences as well as giving and receiving support and challenge from other group members.   Therapeutic Goals: 1. Patient will identify personal and current obstacles as they relate to admission. 2. Patient will identify barriers that currently interfere with their wellness or overcoming obstacles.  3. Patient will identify feelings, thought process and behaviors related to these barriers. 4. Patient will identify two changes they are willing to make to overcome these obstacles:      Summary of Patient Progress   x   Therapeutic Modalities:   Cognitive Behavioral Therapy Solution Focused Therapy Motivational Interviewing Relapse Prevention Therapy  Rashidah [place] S Aldo Sondgeroth, LCSW 07/26/2018 12:37 PM  

## 2018-07-27 LAB — PROLACTIN: PROLACTIN: 11.8 ng/mL (ref 4.0–15.2)

## 2018-07-27 LAB — HEPATITIS C ANTIBODY: HCV Ab: >11 s/co ratio — ABNORMAL HIGH (ref 0.0–0.9)

## 2018-07-27 MED ORDER — GABAPENTIN 400 MG PO CAPS
400.0000 mg | ORAL_CAPSULE | Freq: Three times a day (TID) | ORAL | Status: DC
  Administered 2018-07-27 – 2018-07-31 (×11): 400 mg via ORAL
  Filled 2018-07-27 (×2): qty 21
  Filled 2018-07-27 (×5): qty 1
  Filled 2018-07-27: qty 21
  Filled 2018-07-27 (×10): qty 1

## 2018-07-27 MED ORDER — OLANZAPINE 10 MG PO TABS
10.0000 mg | ORAL_TABLET | Freq: Every day | ORAL | Status: DC
  Administered 2018-07-27 – 2018-07-30 (×4): 10 mg via ORAL
  Filled 2018-07-27 (×3): qty 1
  Filled 2018-07-27: qty 7
  Filled 2018-07-27 (×2): qty 1

## 2018-07-27 MED ORDER — MELATONIN 5 MG PO TABS
5.0000 mg | ORAL_TABLET | Freq: Every day | ORAL | Status: DC
  Administered 2018-07-27 – 2018-07-30 (×4): 5 mg via ORAL
  Filled 2018-07-27 (×3): qty 1
  Filled 2018-07-27: qty 7
  Filled 2018-07-27 (×2): qty 1
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
## 2018-07-27 NOTE — Progress Notes (Signed)
Patient ID: Trevor [name], male   DOB: 15-Feb-1981, 37 y.o.   MRN: [id number]  D: Pt denies SI/HI/AVH, is calm and cooperative, observed to be interacting with peers.   A: Pt given all of his meds as scheduled, also given Ibuprofen 600mg  for complaints of generalized body aches.  Pt being maintained on Q15  Minute checks for safety.  R: Will continue to monitor.

## 2018-07-27 NOTE — Progress Notes (Signed)
Adult Psychoeducational Group Note  Date:  07/27/2018 Time:  10:16 PM  Group Topic/Focus:  Wrap-Up Group:   The focus of this group is to help patients review their daily goal of treatment and discuss progress on daily workbooks.  Participation Level:  Active  Participation Quality:  Appropriate  Affect:  Appropriate  Cognitive:  Alert  Insight: Appropriate  Engagement in Group:  Engaged  Modes of Intervention:  Discussion  Additional Comments:  Pt stated that today was a good day. His goal is to remain positive after discharge.   Kaleen Odea R 07/27/2018, 10:16 PM
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
## 2018-07-27 NOTE — [facility] Group Notes (Signed)
LCSW Group Therapy Note  07/27/2018 1:15pm  Type of Therapy and Topic:  Group Therapy: Avoiding Self-Sabotaging and Enabling Behaviors  Participation Level:  Did Not Attend--pt invited. Chose to remain in bed.    Description of Group:   In this group, patients will learn how to identify obstacles, self-sabotaging and enabling behaviors, as well as: what are they, why do we do them and what needs these behaviors meet. Discuss unhealthy relationships and how to have positive healthy boundaries with those that sabotage and enable. Explore aspects of self-sabotage and enabling in yourself and how to limit these self-destructive behaviors in everyday life.   Therapeutic Goals: 1. Patient will identify one obstacle that relates to self-sabotage and enabling behaviors 2. Patient will identify one personal self-sabotaging or enabling behavior they did prior to admission 3. Patient will state a plan to change the above identified behavior 4. Patient will demonstrate ability to communicate their needs through discussion and/or role play.   Summary of Patient Progress:  x   Therapeutic Modalities:   Cognitive Behavioral Therapy Person-Centered Therapy Motivational Interviewing   Johanna Matto S Benito Lemmerman, LCSW 07/27/2018 11:49 AM  

## 2018-07-27 NOTE — Plan of Care (Signed)
  Problem: Education: Goal: Verbalization of understanding the information provided will improve Outcome: Progressing   Problem: Safety: Goal: Periods of time without injury will increase Outcome: Progressing   

## 2018-07-27 NOTE — Plan of Care (Addendum)
Patient self inventory- Patient slept poor last night, sleep medication was requested but not helpful. Energy level normal, concentration poor. Depression, hopelessness, and anxiety rated 8, 8, 10. Denies physical pain, denies SI HI AVH. Patient's goal is "stay positive."  Patient is compliant with medications prescribed per provider. Safety is maintained with 15 minute checks as well as environmental checks. Will continue to monitor.  Problem: Education: Goal: Emotional status will improve Outcome: Progressing Goal: Mental status will improve Outcome: Progressing Goal: Verbalization of understanding the information provided will improve Outcome: Progressing   Problem: Activity: Goal: Sleeping patterns will improve Outcome: Progressing

## 2018-07-28 DIAGNOSIS — G4700 Insomnia, unspecified: Secondary | ICD-10-CM

## 2018-07-28 DIAGNOSIS — F419 Anxiety disorder, unspecified: Secondary | ICD-10-CM
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
## 2018-07-28 NOTE — Progress Notes (Signed)
Recreation Therapy Notes  Date: 11.8.19 Time: 0930 Location: 300 [address] Dayroom  Group Topic: Stress Management  Goal Area(s) Addresses:  Patient will verbalize importance of using healthy stress management.  Patient will identify positive emotions associated with healthy stress management.   Intervention: Stress Management  Activity :  Meditation.  LRT introduced the stress management technique of meditation.  LRT played a meditation that allowed patients to embody the stillness of a mountain.  Patients were to listen and follow along as the meditation played.  Education:  Stress Management, Discharge Planning.   Education Outcome: Acknowledges edcuation/In group clarification offered/Needs additional education  Clinical Observations/Feedback: Pt did not attend group.     Caroll Rancher, LRT/CTRS         Lillia Abed, Heydi Swango A 07/28/2018 11:22 AM

## 2018-07-28 NOTE — Plan of Care (Addendum)
Patient self inventory- patient slept poor last night. Depression, anxiety and hopelessness rated 10, 8, and 10. Denies SII HI AVH. Denies physical pain. Patient's goal is "get some rest."  Patient is compliant with medications.  Safety is maintained with 15 minute checks.  Problem: Education: Goal: Emotional status will improve Outcome: Progressing Goal: Verbalization of understanding the information provided will improve Outcome: Progressing   Problem: Activity: Goal: Interest or engagement in activities will improve Outcome: Progressing   Problem: Education: Goal: Mental status will improve Outcome: Not Progressing

## 2018-07-28 NOTE — Progress Notes (Signed)
Patient did attend the evening speaker AA meeting.  

## 2018-07-28 NOTE — [facility] Group Notes (Signed)
[facility] Group Notes:  (Nursing/MHT/Case Management/Adjunct)  Date:  07/28/2018  Time:  3:00 PM  Type of Therapy:  Nurse Education  Participation Level:  Active  Participation Quality:  Appropriate  Affect:  Appropriate  Cognitive:  Alert and Appropriate  Insight:  Appropriate and Improving  Engagement in Group:  Engaged  Modes of Intervention:  Activity and Discussion  Summary of Progress/Problems: Patient appropriately participated in group.  Christpoher Sievers 07/28/2018, 3:00 PM 

## 2018-07-28 NOTE — Plan of Care (Signed)
  Problem: Education: Goal: Verbalization of understanding the information provided will improve Outcome: Progressing   Problem: Coping: Goal: Ability to demonstrate self-control will improve Outcome: Progressing   

## 2018-07-28 NOTE — Progress Notes (Signed)
D: Patient observed up and around milieu. Interacts with peers in dayroom. Patient states his day has been "okay, but long." Patient's affect flat, mood anxious. Patient slightly irritable at times. States he does not think he will be able to sleep. Denies pain, physical complaints.   A: Medicated per orders, prn vistaril and nicorette given per his requests. Medication education provided. Level III obs in place for safety. Emotional support offered. Patient encouraged to complete Suicide Safety Plan before discharge. Encouraged to attend and participate in unit programming.   R: Patient verbalizes understanding of POC. On reassess, patient initially asleep however presently awake. Patient not asking for additional meds and review of chart shows patient napped on and off today. Patient denies SI/HI/AVH and remains safe on level III obs. Will continue to monitor throughout the night.

## 2018-07-29 ENCOUNTER — Encounter (HOSPITAL_COMMUNITY): Payer: Self-pay

## 2018-07-29 MED ORDER — NICOTINE POLACRILEX 2 MG MT GUM
CHEWING_GUM | OROMUCOSAL | Status: AC
  Filled 2018-07-29: qty 10

## 2018-07-29 NOTE — [facility] Group Notes (Signed)
LCSW Group Therapy Note   07/29/2018    9:15-10:00am   Type of Therapy and Topic:  Group Therapy: Anger and Coping Skills  Participation Level:  Active   Description of Group:   In this group, patients learned how to recognize the physical, cognitive, emotional, and behavioral responses they have to anger-provoking situations.  They identified how they usually or often react when angered, and learned how healthy and unhealthy coping skills work initially, but the unhealthy ones stop working.   They analyzed how their frequently-chosen coping skill is possibly beneficial and how it is possibly unhelpful.  The group discussed a variety of healthier coping skills that could help in resolving the actual issues, as well as how to go about planning for the the possibility of future similar situations.  Therapeutic Goals: 1. Patients will identify one thing that makes them angry and how they feel emotionally and physically, what their thoughts are or tend to be in those situations, and what healthy or unhealthy coping mechanism they typically use 2. Patients will identify how their coping technique works for them, as well as how it works against them. 3. Patients will explore possible new behaviors to use in future anger situations. 4. Patients will learn that anger itself is normal and cannot be eliminated, and that healthier coping skills can assist with resolving conflict rather than worsening situations.  Summary of Patient Progress:  The patient shared that he was "frustrated" 1 day ago but would not share what happened.  He did later talk at length about the impact of anger on his life.  He was open and invested in the discussion, but did become visibly frustrated when other patients made inappropriate remarks.  Therapeutic Modalities:   Cognitive Behavioral Therapy Motivation Interviewing  Lynnell Chad  .

## 2018-07-29 NOTE — Progress Notes (Signed)
D: Pt was in dayroom upon initial approach.  Pt presents with anxious affect and mood.  He describes his day as "all right" and his goal is to "go home tomorrow."  Pt reports he is unsure of discharge date but he feels safe to discharge.  His aftercare plan is to "go back to my clinic, take my medication."  Pt denies SI/HI, denies hallucinations, denies pain.  Pt has been visible in milieu interacting with peers and staff appropriately.   A: Introduced self to pt.  Met with pt 1:1.  Actively listened to pt and offered support and encouragement.  Medication administered per order.  PRN medication administered for anxiety.  Q15 minute safety checks maintained.  R: Pt is safe on the unit.  Pt is compliant with medications.  Pt verbally contracts for safety.  Will continue to monitor and assess.

## 2018-07-29 NOTE — Progress Notes (Signed)
Did not attend group 

## 2018-07-29 NOTE — Progress Notes (Signed)
Adult Psychoeducational Group Note  Date:  07/29/2018 Time:  6:26 PM  Group Topic/Focus:  Goals Group:   The focus of this group is to help patients establish daily goals to achieve during treatment and discuss how the patient can incorporate goal setting into their daily lives to aide in recovery.  Participation Level:  Active  Participation Quality:  Appropriate  Affect:  Appropriate  Cognitive:  Alert  Insight: Appropriate  Engagement in Group:  Engaged  Modes of Intervention:  Discussion  Additional Comments:  Pt attended group and participated in group discussion.  Cloris Flippo R Raylee Adamec 07/29/2018, 6:26 PM
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
## 2018-07-29 NOTE — [facility] Group Notes (Signed)
[facility] Group Notes:  (Nursing)  Date:  07/29/2018  Time:1:15 PM Type of Therapy:  Nurse Education  Participation Level:  Active  Participation Quality:  Appropriate and Attentive  Affect:  Appropriate  Cognitive:  Appropriate  Insight:  Appropriate  Engagement in Group:  Engaged  Modes of Intervention:  Discussion and Education  Summary of Progress/Problems: Nurse led group played a non competitive learning [employer] that fosters listening skills as well as self expression Shela Nevin 07/29/2018, 3:38 PM

## 2018-07-29 NOTE — Plan of Care (Signed)
D: Patient presents animated, anxious, euthymic. He reports that he had the "best sleep yet, since being here." His appetite is poor, energy normal and concentration poor. He rates his depression, feeling of hopelessness and anxiety 8/10. He denies physical symptoms or withdrawal complaints. Patient denies SI/HI/AVH.  A: Patient checked q15 min, and checks reviewed. Reviewed medication changes with patient and educated on side effects. Educated patient on importance of attending group therapy sessions and educated on several coping skills. Encouarged participation in milieu through recreation therapy and attending meals with peers. Support and encouragement provided. Fluids offered. R: Patient receptive to education on medications, and is medication compliant. Patient contracts for safety on the unit. His goal today is "to be patient" and "stay calm."

## 2018-07-30 NOTE — Progress Notes (Signed)
Patient did attend the evening speaker AA meeting.  

## 2018-07-30 NOTE — [facility] Group Notes (Signed)
[facility] Group Notes:  (Nursing/MHT/Case Management/Adjunct)  Date:  07/30/2018  Time: 130 PM Type of Therapy:  Nurse Education  Participation Level:  Active  Participation Quality:  Appropriate and Attentive  Affect:  Appropriate  Cognitive:  Alert and Appropriate  Insight:  Appropriate and Good  Engagement in Group:  Engaged  Modes of Intervention:  Discussion and Education  Summary of Progress/Problems: Nurse led psycho-educational group: Sleep hygiene  Shela Nevin 07/30/2018, 2:29 PM

## 2018-07-30 NOTE — [facility] Group Notes (Signed)
[facility] LCSW Group Therapy Note  07/30/2018  9:00-10:00AM  Type of Therapy and Topic:  Group Therapy:  Adding Supports Including Being Your Own Support  Participation Level:  Active   Description of Group:  Patients in this group were introduced to the concept that additional supports including self-support are an essential part of recovery.  A song entitled "I Need Help!" was played and a group discussion was held in reaction to the idea of needing to add supports.  A song entitled "My Own Hero" was played and a group discussion ensued in which patients stated they could relate to the song and it inspired them to realize they have be willing to help themselves in order to succeed, because other people cannot achieve sobriety or stability for them.  We discussed adding a variety of healthy supports to address the various needs in their lives.  A song was played called "I Know Where I've Been" toward the end of group and used to conduct an inspirational wrap-up to group of remembering how far they have already come in their journey.  Therapeutic Goals: 1)  demonstrate the importance of being a part of one's own support system 2)  discuss reasons people in one's life may eventually be unable to be continually supportive  3)  identify the patient's current support system and   4)  elicit commitments to add healthy supports and to become more conscious of being self-supportive   Summary of Patient Progress:  The patient expressed that he has only one friend who is supportive, and that person is often busy so he does not want to contact him and be a burden.  He did identify with the idea of being a better self-support and was able to verbalize ways in which he could do this.   Therapeutic Modalities:   Motivational Interviewing Activity  Lynnell Chad
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
## 2018-07-30 NOTE — Plan of Care (Signed)
D: Patient presents calm and cooperative. He states he is feeling anxious this morning, and rates his anxiety 2/10. His sleep was fair last night, but he stated he woke up frequently. He received medication for sleep and stated it was helpful. His appetite is good, energy high, and concentration good. He rates his depression and feeling of hopelessness 2/10. He denies physical complaints or withdrawal symptoms. Patient denies SI/HI/AVH.  A: Patient checked q15 min, and checks reviewed. Reviewed medication changes with patient and educated on side effects. Educated patient on importance of attending group therapy sessions and educated on several coping skills. Encouarged participation in milieu through recreation therapy and attending meals with peers. Support and encouragement provided. Fluids offered. R: Patient receptive to education on medications, and is medication compliant. Patient contracts for safety on the unit. His goal today is to "Go home."

## 2018-07-31 DIAGNOSIS — E785 Hyperlipidemia, unspecified: Secondary | ICD-10-CM

## 2018-07-31 MED ORDER — ATORVASTATIN CALCIUM 20 MG PO TABS: 20.0000 mg | ORAL_TABLET | Freq: Every day | ORAL | 0 refills | Status: DC

## 2018-07-31 MED ORDER — NICOTINE 21 MG/24HR TD PT24
21.0000 mg | MEDICATED_PATCH | Freq: Every day | TRANSDERMAL | Status: DC
  Administered 2018-07-31: 21 mg via TRANSDERMAL
  Filled 2018-07-31 (×2): qty 1
  Filled 2018-07-31: qty 7
  Filled 2018-07-31: qty 1

## 2018-07-31 MED ORDER — MELATONIN 5 MG PO TABS: 5.0000 mg | ORAL_TABLET | Freq: Every day | ORAL | 0 refills | Status: DC

## 2018-07-31 MED ORDER — HYDROXYZINE HCL 50 MG PO TABS: 50.0000 mg | ORAL_TABLET | Freq: Four times a day (QID) | ORAL | 0 refills | Status: DC | PRN

## 2018-07-31 MED ORDER — NICOTINE 21 MG/24HR TD PT24: 21.0000 mg | MEDICATED_PATCH | Freq: Every day | TRANSDERMAL | 0 refills | Status: DC

## 2018-07-31 MED ORDER — OLANZAPINE 10 MG PO TABS: 10.0000 mg | ORAL_TABLET | Freq: Every day | ORAL | 0 refills | Status: DC

## 2018-07-31 MED ORDER — SERTRALINE HCL 100 MG PO TABS: 100.0000 mg | ORAL_TABLET | Freq: Every day | ORAL | 0 refills | Status: DC

## 2018-07-31 MED ORDER — GABAPENTIN 400 MG PO CAPS: 400.0000 mg | ORAL_CAPSULE | Freq: Three times a day (TID) | ORAL | 1 refills | Status: DC

## 2018-07-31 NOTE — Progress Notes (Signed)
D: Patient observed up and social on the unit. Behavior appropriate. Patient states he is discharging tomorrow and feels ready and prepared. Patient's affect appropriate as is mood.  Reported back pain at start of shift at a 6/10 as well as nicotine cravings.   A: Medicated per orders, prn tylenol and nicorette given. Medication education provided. Level III obs in place for safety. Emotional support offered. Patient encouraged to complete Suicide Safety Plan before discharge. Encouraged to attend and participate in unit programming.   R: Patient verbalizes understanding of POC. On reassess, patient's pain is a 10/10, cravings decreased. Patient denies SI/HI/AVH and remains safe on level III obs. Will continue to monitor throughout the night.

## 2018-07-31 NOTE — Progress Notes (Signed)
Discharge note: Patient reviewed discharge paperwork with RN including prescriptions, follow up appointments, and lab work. Patient given the opportunity to ask questions. All concerns were addressed. All belongings were returned to patient. Denied SI/HI/AVH. Patient thanked staff for their care while at the hospital.  Patient was discharged to the lobby with a bus pass. Patient was going back home.

## 2018-07-31 NOTE — [facility] Group Notes (Signed)
Pt attended spiritual care group on grief and loss facilitated by chaplain Burnis Kingfisher   Group goal of establishing open and affirming space for members to engage grief, normalize and support grief experience and provide psycho social education and grief support. Group opened with brief discussion and psycho-social ed around grief and loss in relationships and in relation to self - identifying life patterns, circumstances, changes connected to grief response. Group participated in facilitated process group around topic of grief.    Trevor [name] was present through group until his discharge time.  He shared awareness of his own grief journey.  Related that in his mother's passing, he had lost a "best friend" and the connection to his family.  After her passing, he coped through substance use.  Recognized that he needed other supports in his life.  Related with group that mentioning grief felt like an explosion, but over time, he was able to relate to others and find support.

## 2018-07-31 NOTE — [facility] Group Notes (Signed)
Adult Psychoeducational Group Note  Date:  07/31/2018 Time:  9:18 AM  Group Topic/Focus:  Orientation:   The focus of this group is to educate the patient on the purpose and policies of crisis stabilization and provide a format to answer questions about their admission.  The group details unit policies and expectations of patients while admitted.  Participation Level:  Active  Participation Quality:  Appropriate  Affect:  Appropriate  Cognitive:  Appropriate  Insight: Appropriate  Engagement in Group:  Engaged  Modes of Intervention:  Orientation  Additional Comments:  Pt attended orientation group facilitated by Jacquelyne Balint 07/31/2018, 9:18 AM

## 2018-07-31 NOTE — Plan of Care (Signed)
  Problem: Education: Goal: Emotional status will improve Outcome: Adequate for Discharge   Problem: Education: Goal: Mental status will improve Outcome: Adequate for Discharge   Problem: Education: Goal: Verbalization of understanding the information provided will improve Outcome: Adequate for Discharge   Problem: Activity: Goal: Interest or engagement in activities will improve Outcome: Adequate for Discharge   Patient said he is ready for discharge.

## 2018-07-31 NOTE — Progress Notes (Signed)
  [facility] Adult Case Management Discharge Plan :  Will you be returning to the same living situation after discharge:  Yes,  home At discharge, do you have transportation home?: Yes,  bus pass needed Do you have the ability to pay for your medications: Yes,  mental health  Release of information consent forms completed and submitted to medical records by CSW.   Patient to Follow up at: Follow-up Information    [facility] Go on 08/01/2018.   Why:  Methadone maintenance appointment is Tuesday November 12 at 6:00AM.  Contact information: [address] [place], [place] [address] Phone: [phone] Fax: [phone]          Next level of care provider has access to [facility] Link:no  Safety Planning and Suicide Prevention discussed: Yes,  SPE completed with pt; pt declined to consent to collateral contact. SPI pamphlet and mobile crisis information provided.    Has patient been referred to the Quitline?: Patient refused referral  Patient has been referred for addiction treatment: Yes  Rona Ravens, LCSW 07/31/2018, 8:38 AM

## 2018-07-31 NOTE — Tx Team (Signed)
Interdisciplinary Treatment and Diagnostic Plan Update  07/31/2018 Time of Session: [id number] Trevor [name] MRN: [id number]  Principal Diagnosis: Bipolar I disorder, current or most recent episode depressed, with psychotic features [facility])  Secondary Diagnoses: Principal Problem:   Bipolar I disorder, current or most recent episode depressed, with psychotic features (HCC) Active Problems:   Cocaine use disorder, severe, dependence (HCC)   Current Medications:  Current Facility-Administered Medications  Medication Dose Route Frequency Provider Last Rate Last Dose  . acetaminophen (TYLENOL) tablet 650 mg  650 mg Oral Q6H PRN Thermon Leyland, NP   650 mg at 07/30/18 1939  . alum & mag hydroxide-simeth (MAALOX/MYLANTA) 200-200-20 MG/5ML suspension 30 mL  30 mL Oral Q4H PRN Fransisca Kaufmann A, NP      . atorvastatin (LIPITOR) tablet 20 mg  20 mg Oral q1800 Micheal Likens, MD   20 mg at 07/30/18 1646  . feeding supplement (ENSURE ENLIVE) (ENSURE ENLIVE) liquid 237 mL  237 mL Oral BID BM Micheal Likens, MD   237 mL at 07/31/18 1009  . gabapentin (NEURONTIN) capsule 400 mg  400 mg Oral TID Micheal Likens, MD   400 mg at 07/31/18 0759  . hydrOXYzine (ATARAX/VISTARIL) tablet 50 mg  50 mg Oral Q6H PRN Micheal Likens, MD   50 mg at 07/31/18 0802  . ibuprofen (ADVIL,MOTRIN) tablet 600 mg  600 mg Oral Q6H PRN Micheal Likens, MD   600 mg at 07/26/18 2126  . magnesium hydroxide (MILK OF MAGNESIA) suspension 30 mL  30 mL Oral Daily PRN Thermon Leyland, NP      . Melatonin TABS 5 mg  5 mg Oral Q2000 Cobos, Rockey Situ, MD   5 mg at 07/30/18 1943  . methadone (DOLOPHINE) tablet 110 mg  110 mg Oral Daily Fransisca Kaufmann A, NP   110 mg at 07/31/18 0759  . nicotine (NICODERM CQ - dosed in mg/24 hours) patch 21 mg  21 mg Transdermal Daily Micheal Likens, MD   21 mg at 07/31/18 0802  . OLANZapine (ZYPREXA) tablet 10 mg  10 mg Oral QHS Micheal Likens, MD    10 mg at 07/30/18 2113  . sertraline (ZOLOFT) tablet 100 mg  100 mg Oral Daily Micheal Likens, MD   100 mg at 07/31/18 0759   PTA Medications: Medications Prior to Admission  Medication Sig Dispense Refill Last Dose  . doxepin (SINEQUAN) 25 MG capsule Take 150 mg by mouth at bedtime.  1   . famotidine (PEPCID) 20 MG tablet Take 1 tablet (20 mg total) by mouth 2 (two) times daily. (Patient taking differently: Take 20 mg by mouth as needed for heartburn or indigestion. ) 30 tablet 0 couple months at as needed  . lidocaine (LIDODERM) 5 % Place 1 patch onto the skin daily. Remove & Discard patch within 12 hours or as directed by MD (Patient not taking: Reported on 07/24/2018) 30 patch 0 Not Taking at Unknown time  . METHADONE HCL PO Take 110 mg by mouth daily at 6 (six) AM.    07/23/2018 at 2130  . naproxen (NAPROSYN) 375 MG tablet Take 1 tablet (375 mg total) by mouth 2 (two) times daily. (Patient not taking: Reported on 07/24/2018) 20 tablet 0 Not Taking at Unknown time  . ondansetron (ZOFRAN ODT) 8 MG disintegrating tablet 8mg  ODT q4 hours prn nausea (Patient not taking: Reported on 12/14/2017) 4 tablet 0 Not Taking at Unknown time  . oxyCODONE-acetaminophen (PERCOCET/ROXICET) 5-325 MG  tablet Take 1 tablet by mouth every 4 (four) hours as needed for severe pain. (Patient not taking: Reported on 09/19/2017) 6 tablet 0 Not Taking at Unknown time  . sertraline (ZOLOFT) 100 MG tablet Take 100 mg by mouth at bedtime.    07/23/2018 at Unknown time    Patient Stressors: Loss of mother a year ago Substance abuse  Patient Strengths: Ability for insight Average or above average intelligence Capable of independent living Communication skills General fund of knowledge Motivation for treatment/growth  Treatment Modalities: Medication Management, Group therapy, Case management,  1 to 1 session with clinician, Psychoeducation, Recreational therapy.   Physician Treatment Plan for Primary Diagnosis:  Bipolar I disorder, current or most recent episode depressed, with psychotic features (HCC) Long Term Goal(s): Improvement in symptoms so as ready for discharge Improvement in symptoms so as ready for discharge   Short Term Goals: Ability to identify and develop effective coping behaviors will improve Ability to identify triggers associated with substance abuse/mental health issues will improve  Medication Management: Evaluate patient's response, side effects, and tolerance of medication regimen.  Therapeutic Interventions: 1 to 1 sessions, Unit Group sessions and Medication administration.  Evaluation of Outcomes: Adequate for discharge  Physician Treatment Plan for Secondary Diagnosis: Principal Problem:   Bipolar I disorder, current or most recent episode depressed, with psychotic features (HCC) Active Problems:   Cocaine use disorder, severe, dependence (HCC)  Long Term Goal(s): Improvement in symptoms so as ready for discharge Improvement in symptoms so as ready for discharge   Short Term Goals: Ability to identify and develop effective coping behaviors will improve Ability to identify triggers associated with substance abuse/mental health issues will improve     Medication Management: Evaluate patient's response, side effects, and tolerance of medication regimen.  Therapeutic Interventions: 1 to 1 sessions, Unit Group sessions and Medication administration.  Evaluation of Outcomes: Adequate for discharge   RN Treatment Plan for Primary Diagnosis: Bipolar I disorder, current or most recent episode depressed, with psychotic features (HCC) Long Term Goal(s): Knowledge of disease and therapeutic regimen to maintain health will improve  Short Term Goals: Ability to remain free from injury will improve, Ability to disclose and discuss suicidal ideas and Ability to identify and develop effective coping behaviors will improve  Medication Management: RN will administer medications as  ordered by provider, will assess and evaluate patient's response and provide education to patient for prescribed medication. RN will report any adverse and/or side effects to prescribing provider.  Therapeutic Interventions: 1 on 1 counseling sessions, Psychoeducation, Medication administration, Evaluate responses to treatment, Monitor vital signs and CBGs as ordered, Perform/monitor CIWA, COWS, AIMS and Fall Risk screenings as ordered, Perform wound care treatments as ordered.  Evaluation of Outcomes: Adequate for discharge   LCSW Treatment Plan for Primary Diagnosis: Bipolar I disorder, current or most recent episode depressed, with psychotic features (HCC) Long Term Goal(s): Safe transition to appropriate next level of care at discharge, Engage patient in therapeutic group addressing interpersonal concerns.  Short Term Goals: Engage patient in aftercare planning with referrals and resources, Facilitate patient progression through stages of change regarding substance use diagnoses and concerns and Identify triggers associated with mental health/substance abuse issues  Therapeutic Interventions: Assess for all discharge needs, 1 to 1 time with Social worker, Explore available resources and support systems, Assess for adequacy in community support network, Educate family and significant other(s) on suicide prevention, Complete Psychosocial Assessment, Interpersonal group therapy.  Evaluation of Outcomes: Adequate for discharge   Progress  in Treatment: Attending groups: Yes. Participating in groups: Yes. Taking medication as prescribed: Yes. Toleration medication: Yes. Family/Significant other contact made: SPE completed with pt; pt declined to consent to collateral contact.  Patient understands diagnosis: Yes. Discussing patient identified problems/goals with staff: Yes. Medical problems stabilized or resolved: Yes. Denies suicidal/homicidal ideation: Yes. Issues/concerns per patient  self-inventory: No. Other: n/a  New problem(s) identified: No, Describe:  n/a  New Short Term/Long Term Goal(s): detox, medication management for mood stabilization; elimination of SI thoughts; development of comprehensive mental wellness/sobriety plan.   Patient Goals:  "I need my medications adjusted and help with my depression and cocaine use."   Discharge Plan or Barriers: CSW assessing. Pt plans to return home and resume services through [facility] for methadone maintenance. Pt requesting help with paying for psychiatric drugs--orange card application provided and assistance provided to help him complete. [facility] pamphlet, Mobile Crisis information, and AA/NA information provided to patient for additional community support and resources.   Reason for Continuation of Hospitalization: none  Estimated Length of Stay: Monday, 07/31/18  Attendees: Patient: 07/31/2018 10:31 AM  Physician: Dr. Altamese Magnolia MD 07/31/2018 10:31 AM  Nursing: Arlyss Repress RN; Huntley Dec RN 07/31/2018 10:31 AM  RN Care Manager:x 07/31/2018 10:31 AM  Social Worker: Corrie Mckusick LCSW 07/31/2018 10:31 AM  Recreational Therapist: x 07/31/2018 10:31 AM  Other: Armandina Stammer NP; Hillery Jacks NP 07/31/2018 10:31 AM  Other:  07/31/2018 10:31 AM  Other: 07/31/2018 10:31 AM    Scribe for Treatment Team: Rona Ravens, LCSW 07/31/2018 10:31 AM
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
## 2018-07-31 NOTE — Progress Notes (Signed)
Recreation Therapy Notes  Date: 11.11.19 Time: 0930 Location: [address] Dayroom  Group Topic: Stress Management  Goal Area(s) Addresses:  Patient will verbalize importance of using healthy stress management.  Patient will identify positive emotions associated with healthy stress management.   Behavioral Response: Engaged  Intervention: Stress Management  Activity :  Progressive Muscle Relaxation.  LRT introduced the stress management technique of progressive muscle relaxation.  LRT read a script to lead patients through a series of exercises that allowed them to tense and then relax each muscle group individually.    Education:  Stress Management, Discharge Planning.   Education Outcome: Acknowledges edcuation/In group clarification offered/Needs additional education  Clinical Observations/Feedback: Pt attended and participated in group.    Wai Litt, LRT/CTRS         Sena Hoopingarner A 07/31/2018 10:53 AM 

## 2018-07-31 NOTE — [facility] Suicide Risk Assessment (Signed)
[facility] Discharge Suicide Risk Assessment   Principal Problem: Bipolar I disorder, current or most recent episode depressed, with psychotic features [facility]) Discharge Diagnoses:  Patient Active Problem List   Diagnosis Date Noted  . Cocaine use disorder, severe, dependence (HCC) [F14.20] 07/25/2018  . Bipolar I disorder, current or most recent episode depressed, with psychotic features (HCC) [F31.5] 07/24/2018    Total Time spent with patient: 30 minutes    Psychiatric Specialty Exam:   Blood pressure 107/71, pulse 85, temperature 98.8 F (37.1 C), temperature source Oral, resp. rate 20, height 6' (1.829 m), weight 83.5 kg.Body mass index is 24.95 kg/m.   Mental Status Per Nursing Assessment::   On Admission:  NA  Demographic Factors:  Male, Caucasian, Low socioeconomic status, Living alone and Unemployed  Loss Factors: Decline in physical health and Financial problems/change in socioeconomic status  Historical Factors: Prior suicide attempts, Family history of mental illness or substance abuse and Impulsivity  Risk Reduction Factors:   Positive social support, Positive therapeutic relationship and Positive coping skills or problem solving skills  Continued Clinical Symptoms:  Bipolar Disorder:   Depressive phase Alcohol/Substance Abuse/Dependencies More than one psychiatric diagnosis  Cognitive Features That Contribute To Risk:  None    Suicide Risk:  Minimal: No identifiable suicidal ideation.  Patients presenting with no risk factors but with morbid ruminations; may be classified as minimal risk based on the severity of the depressive symptoms  Follow-up Information    [facility] Go on 08/01/2018.   Why:  Methadone maintenance appointment is Tuesday November 12 at 6:00AM.  Contact information: [address] [place], [place] [address] Phone: [phone] Fax: [phone]          Plan Of Care/Follow-up recommendations:  Activity:  as  tolerated Diet:  normal Tests:  NA Other:  see above for DC plan  Micheal Likens, MD 07/31/2018, 8:04 AM

## 2018-08-14 ENCOUNTER — Encounter (HOSPITAL_COMMUNITY): Payer: Self-pay | Admitting: Emergency Medicine

## 2018-08-14 ENCOUNTER — Other Ambulatory Visit: Payer: Self-pay

## 2018-08-14 ENCOUNTER — Emergency Department (HOSPITAL_COMMUNITY)
Admission: EM | Admit: 2018-08-14 | Discharge: 2018-08-14 | Disposition: A | Discharge status: Home / Self Care | Payer: Medicaid Other | Attending: Emergency Medicine | Admitting: Emergency Medicine

## 2018-08-14 ENCOUNTER — Emergency Department (HOSPITAL_COMMUNITY): Payer: Medicaid Other

## 2018-08-14 DIAGNOSIS — R1084 Generalized abdominal pain: Secondary | ICD-10-CM | POA: Insufficient documentation

## 2018-08-14 DIAGNOSIS — R630 Anorexia: Secondary | ICD-10-CM | POA: Insufficient documentation

## 2018-08-14 DIAGNOSIS — R61 Generalized hyperhidrosis: Secondary | ICD-10-CM | POA: Insufficient documentation

## 2018-08-14 DIAGNOSIS — F17210 Nicotine dependence, cigarettes, uncomplicated: Secondary | ICD-10-CM | POA: Insufficient documentation

## 2018-08-14 DIAGNOSIS — F315 Bipolar disorder, current episode depressed, severe, with psychotic features: Secondary | ICD-10-CM | POA: Insufficient documentation

## 2018-08-14 DIAGNOSIS — Z79899 Other long term (current) drug therapy: Secondary | ICD-10-CM | POA: Insufficient documentation

## 2018-08-14 DIAGNOSIS — G4700 Insomnia, unspecified: Secondary | ICD-10-CM | POA: Insufficient documentation

## 2018-08-14 DIAGNOSIS — Z046 Encounter for general psychiatric examination, requested by authority: Secondary | ICD-10-CM | POA: Insufficient documentation

## 2018-08-14 DIAGNOSIS — R05 Cough: Secondary | ICD-10-CM | POA: Insufficient documentation

## 2018-08-14 DIAGNOSIS — F1420 Cocaine dependence, uncomplicated: Secondary | ICD-10-CM | POA: Diagnosis present

## 2018-08-14 DIAGNOSIS — R0602 Shortness of breath: Secondary | ICD-10-CM | POA: Insufficient documentation

## 2018-08-14 DIAGNOSIS — R45851 Suicidal ideations: Secondary | ICD-10-CM | POA: Insufficient documentation

## 2018-08-14 DIAGNOSIS — R5383 Other fatigue: Secondary | ICD-10-CM | POA: Insufficient documentation

## 2018-08-14 LAB — RAPID URINE DRUG SCREEN, HOSP PERFORMED
Amphetamines: NOT DETECTED
BARBITURATES: NOT DETECTED
Benzodiazepines: NOT DETECTED
COCAINE: POSITIVE — AB
Opiates: NOT DETECTED
Tetrahydrocannabinol: NOT DETECTED

## 2018-08-14 LAB — I-STAT TROPONIN, ED: Troponin i, poc: 0 ng/mL (ref 0.00–0.08)

## 2018-08-14 LAB — CBC WITH DIFFERENTIAL/PLATELET
ABS IMMATURE GRANULOCYTES: 0.07 10*3/uL (ref 0.00–0.07)
BASOS PCT: 1 %
Basophils Absolute: 0.1 10*3/uL (ref 0.0–0.1)
Eosinophils Absolute: 0.2 10*3/uL (ref 0.0–0.5)
Eosinophils Relative: 1 %
HCT: 48.6 % (ref 39.0–52.0)
Hemoglobin: 15.5 g/dL (ref 13.0–17.0)
Immature Granulocytes: 1 %
Lymphocytes Relative: 30 %
Lymphs Abs: 3.7 10*3/uL (ref 0.7–4.0)
MCH: 28.7 pg (ref 26.0–34.0)
MCHC: 31.9 g/dL (ref 30.0–36.0)
MCV: 89.8 fL (ref 80.0–100.0)
MONO ABS: 0.5 10*3/uL (ref 0.1–1.0)
Monocytes Relative: 4 %
NEUTROS ABS: 8 10*3/uL — AB (ref 1.7–7.7)
NEUTROS PCT: 63 %
PLATELETS: 402 10*3/uL — AB (ref 150–400)
RBC: 5.41 MIL/uL (ref 4.22–5.81)
RDW: 14.6 % (ref 11.5–15.5)
WBC: 12.6 10*3/uL — AB (ref 4.0–10.5)
nRBC: 0 % (ref 0.0–0.2)

## 2018-08-14 LAB — ACETAMINOPHEN LEVEL

## 2018-08-14 LAB — ETHANOL: Alcohol, Ethyl (B): <10 mg/dL (ref <10)

## 2018-08-14 LAB — BASIC METABOLIC PANEL
ANION GAP: 10 (ref 5–15)
BUN: 14 mg/dL (ref 6–20)
CO2: 26 mmol/L (ref 22–32)
Calcium: 10.2 mg/dL (ref 8.9–10.3)
Chloride: 102 mmol/L (ref 98–111)
Creatinine, Ser: 0.79 mg/dL (ref 0.61–1.24)
GFR calc Af Amer: >60 mL/min (ref >60)
Glucose, Bld: 123 mg/dL — ABNORMAL HIGH (ref 70–99)
POTASSIUM: 3.6 mmol/L (ref 3.5–5.1)
Sodium: 138 mmol/L (ref 135–145)

## 2018-08-14 LAB — SALICYLATE LEVEL

## 2018-08-14 MED ORDER — METHADONE HCL 5 MG PO TABS
115.0000 mg | ORAL_TABLET | Freq: Once | ORAL | Status: AC
  Administered 2018-08-14: 115 mg via ORAL
  Filled 2018-08-14: qty 23
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
## 2018-08-14 NOTE — ED Triage Notes (Signed)
[facility]

## 2018-08-14 NOTE — ED Notes (Signed)
TTS in process 

## 2018-08-14 NOTE — ED Notes (Signed)
[facility]. Pt DC home.

## 2018-08-14 NOTE — ED Notes (Signed)
Pt states he has had a cough for the past few days

## 2018-08-14 NOTE — ED Notes (Addendum)
Pts belongings inventoried by [employer] on nightshift and belongings placed in locker #2

## 2018-08-14 NOTE — Progress Notes (Signed)
Please refrain from Methadone prescribing as he is positive for cocaine and would not be able to get it at his clinic.  Clonidine opiate withdrawal protocol can be started instead.  Nanine MeansJamison Aiesha Leland, PMHNP

## 2018-08-14 NOTE — ED Notes (Signed)
Pt transported to xray 
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
## 2018-08-14 NOTE — ED Triage Notes (Signed)
Pt states he has had shortness of breath for the past 2-3 days. Pt also states he has Suicidal thought x 3 days with a plan to slit his wrist. Pt states he has chills, panic attacks, sweating then cold. Pt also on methadone

## 2018-08-21 MED FILL — SERTRALINE HCL 100 MG TAB: 100 | 30 days supply | Qty: 30 | Fill #1

## 2018-08-21 MED FILL — DOXEPIN 25 MG CAPSULE: 25 | 30 days supply | Qty: 180 | Fill #1

## 2018-09-15 MED FILL — SERTRALINE HCL 100 MG TAB: 100 | 30 days supply | Qty: 30 | Fill #0

## 2018-09-15 MED FILL — DOXEPIN 75 MG CAPSULE: 75 | 30 days supply | Qty: 60 | Fill #0

## 2018-09-15 MED FILL — DOXYCYCLINE HYCLATE 100 MG: 100 | 7 days supply | Qty: 14 | Fill #0

## 2018-10-16 MED FILL — DOXEPIN HCL 75 MG CAPS: 75 | 30 days supply | Qty: 60 | Fill #1

## 2018-10-16 MED FILL — SERTRALINE HCL 100 MG TAB: 100 | 30 days supply | Qty: 30 | Fill #1

## 2018-10-27 MED FILL — SERTRALINE HCL 100 MG TAB: 100 | 30 days supply | Qty: 30 | Fill #1

## 2019-04-05 MED FILL — SERTRALINE HCL 100 MG TAB: 100 | 30 days supply | Qty: 30 | Fill #0

## 2019-04-05 MED FILL — DOXEPIN 75 MG CAPSULE: 75 | 30 days supply | Qty: 30 | Fill #0

## 2019-05-23 MED FILL — SERTRALINE HCL 100 MG TAB: 100 | 30 days supply | Qty: 30 | Fill #1

## 2019-05-23 MED FILL — DOXEPIN HCL 75 MG CAPS: 75 | 30 days supply | Qty: 30 | Fill #1

## 2019-06-26 MED FILL — SERTRALINE HCL 100 MG TAB: 100 | 30 days supply | Qty: 45 | Fill #0

## 2019-06-26 MED FILL — DOXEPIN HCL 75 MG CAPS: 75 | 30 days supply | Qty: 60 | Fill #0

## 2019-07-25 MED FILL — DOXEPIN 75 MG CAPSULE: 75 | 30 days supply | Qty: 60 | Fill #1

## 2019-07-25 MED FILL — SERTRALINE HCL 100 MG TAB: 100 | 30 days supply | Qty: 45 | Fill #1

## 2019-10-02 ENCOUNTER — Encounter (HOSPITAL_COMMUNITY): Payer: Self-pay

## 2019-10-02 ENCOUNTER — Emergency Department (HOSPITAL_COMMUNITY): Payer: Self-pay

## 2019-10-02 ENCOUNTER — Emergency Department (HOSPITAL_COMMUNITY)
Admission: EM | Admit: 2019-10-02 | Discharge: 2019-10-02 | Disposition: A | Discharge status: Home / Self Care | Payer: Self-pay | Attending: Emergency Medicine | Admitting: Emergency Medicine

## 2019-10-02 ENCOUNTER — Other Ambulatory Visit: Payer: Self-pay

## 2019-10-02 DIAGNOSIS — L03113 Cellulitis of right upper limb: Secondary | ICD-10-CM | POA: Insufficient documentation

## 2019-10-02 DIAGNOSIS — F1920 Other psychoactive substance dependence, uncomplicated: Secondary | ICD-10-CM | POA: Insufficient documentation

## 2019-10-02 DIAGNOSIS — F17210 Nicotine dependence, cigarettes, uncomplicated: Secondary | ICD-10-CM | POA: Insufficient documentation

## 2019-10-02 DIAGNOSIS — F1910 Other psychoactive substance abuse, uncomplicated: Secondary | ICD-10-CM

## 2019-10-02 MED ORDER — SULFAMETHOXAZOLE-TRIMETHOPRIM 800-160 MG PO TABS
1.0000 | ORAL_TABLET | Freq: Once | ORAL | Status: AC
  Administered 2019-10-02: 1 via ORAL
  Filled 2019-10-02: qty 1

## 2019-10-02 MED ORDER — ACETAMINOPHEN 325 MG PO TABS: 650.0000 mg | ORAL_TABLET | Freq: Four times a day (QID) | ORAL | 0 refills | Status: DC | PRN

## 2019-10-02 MED ORDER — ACETAMINOPHEN 500 MG PO TABS
1000.0000 mg | ORAL_TABLET | Freq: Once | ORAL | Status: AC
  Administered 2019-10-02: 1000 mg via ORAL
  Filled 2019-10-02: qty 2

## 2019-10-02 MED ORDER — SULFAMETHOXAZOLE-TRIMETHOPRIM 800-160 MG PO TABS: 1.0000 | ORAL_TABLET | Freq: Two times a day (BID) | ORAL | 0 refills | Status: AC

## 2019-10-02 MED FILL — SULFAMETHOXAZOLE-TMP DS TAB: 800-160 | 7 days supply | Qty: 14 | Fill #0

## 2019-10-02 NOTE — ED Notes (Signed)
Pt refused discharge vital signs

## 2019-10-02 NOTE — ED Triage Notes (Signed)
C/o abscess on right hand from drug use per patient.    Patient figity in triage.    A/ox4 Ambulatory in lobby
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
## 2019-10-02 NOTE — Discharge Instructions (Signed)
It is extremely important that you take your antibiotics as prescribed.  Take entire course, even if your symptoms improve. Use Tylenol as needed for pain or fever. Follow-up with your primary care doctor for recheck as needed. Return to the emergency room if your symptoms are not improving.  Return with any numbness, difficulty moving your fingers. Return with new or concerning symptoms.

## 2019-10-03 MED FILL — SERTRALINE HCL 100 MG TAB: 100 | 23 days supply | Qty: 23 | Fill #0

## 2019-10-03 MED FILL — DOXEPIN 150 MG CAPSULE: 150 | 23 days supply | Qty: 23 | Fill #0

## 2019-11-29 MED FILL — DOXEPIN HCL 75 MG CAPS: 75 | 30 days supply | Qty: 60 | Fill #0

## 2020-02-13 ENCOUNTER — Other Ambulatory Visit: Payer: Self-pay

## 2020-02-13 ENCOUNTER — Emergency Department (HOSPITAL_COMMUNITY)
Admission: EM | Admit: 2020-02-13 | Discharge: 2020-02-13 | Disposition: A | Discharge status: Home / Self Care | Payer: Self-pay | Attending: Emergency Medicine | Admitting: Emergency Medicine

## 2020-02-13 ENCOUNTER — Emergency Department (HOSPITAL_COMMUNITY): Payer: Self-pay

## 2020-02-13 ENCOUNTER — Encounter (HOSPITAL_COMMUNITY): Payer: Self-pay

## 2020-02-13 DIAGNOSIS — F17210 Nicotine dependence, cigarettes, uncomplicated: Secondary | ICD-10-CM | POA: Insufficient documentation

## 2020-02-13 DIAGNOSIS — R531 Weakness: Secondary | ICD-10-CM | POA: Insufficient documentation

## 2020-02-13 DIAGNOSIS — Z79899 Other long term (current) drug therapy: Secondary | ICD-10-CM | POA: Insufficient documentation

## 2020-02-13 DIAGNOSIS — F319 Bipolar disorder, unspecified: Secondary | ICD-10-CM | POA: Insufficient documentation

## 2020-02-13 DIAGNOSIS — L03113 Cellulitis of right upper limb: Secondary | ICD-10-CM | POA: Insufficient documentation

## 2020-02-13 DIAGNOSIS — M7918 Myalgia, other site: Secondary | ICD-10-CM | POA: Insufficient documentation

## 2020-02-13 LAB — BASIC METABOLIC PANEL
Anion gap: 8 (ref 5–15)
BUN: 10 mg/dL (ref 6–20)
CO2: 27 mmol/L (ref 22–32)
Calcium: 8.8 mg/dL — ABNORMAL LOW (ref 8.9–10.3)
Chloride: 101 mmol/L (ref 98–111)
Creatinine, Ser: 0.76 mg/dL (ref 0.61–1.24)
GFR calc Af Amer: >60 mL/min (ref >60)
GFR calc non Af Amer: >60 mL/min (ref >60)
Glucose, Bld: 99 mg/dL (ref 70–99)
Potassium: 5.1 mmol/L (ref 3.5–5.1)
Sodium: 136 mmol/L (ref 135–145)

## 2020-02-13 LAB — CBC WITH DIFFERENTIAL/PLATELET
Abs Immature Granulocytes: 0.02 10*3/uL (ref 0.00–0.07)
Basophils Absolute: 0 10*3/uL (ref 0.0–0.1)
Basophils Relative: 0 %
Eosinophils Absolute: 0.2 10*3/uL (ref 0.0–0.5)
Eosinophils Relative: 3 %
HCT: 37.2 % — ABNORMAL LOW (ref 39.0–52.0)
Hemoglobin: 12 g/dL — ABNORMAL LOW (ref 13.0–17.0)
Immature Granulocytes: 0 %
Lymphocytes Relative: 30 %
Lymphs Abs: 2.5 10*3/uL (ref 0.7–4.0)
MCH: 29.3 pg (ref 26.0–34.0)
MCHC: 32.3 g/dL (ref 30.0–36.0)
MCV: 91 fL (ref 80.0–100.0)
Monocytes Absolute: 0.5 10*3/uL (ref 0.1–1.0)
Monocytes Relative: 6 %
Neutro Abs: 5 10*3/uL (ref 1.7–7.7)
Neutrophils Relative %: 61 %
Platelets: 235 10*3/uL (ref 150–400)
RBC: 4.09 MIL/uL — ABNORMAL LOW (ref 4.22–5.81)
RDW: 13.2 % (ref 11.5–15.5)
WBC: 8.2 10*3/uL (ref 4.0–10.5)
nRBC: 0 % (ref 0.0–0.2)

## 2020-02-13 LAB — LACTIC ACID, PLASMA: Lactic Acid, Venous: 1.5 mmol/L (ref 0.5–1.9)

## 2020-02-13 MED ORDER — IBUPROFEN 800 MG PO TABS: 800.0000 mg | ORAL_TABLET | Freq: Three times a day (TID) | ORAL | 0 refills | Status: DC | PRN

## 2020-02-13 MED ORDER — ACETAMINOPHEN 325 MG PO TABS
650.0000 mg | ORAL_TABLET | Freq: Once | ORAL | Status: AC
  Administered 2020-02-13: 650 mg via ORAL
  Filled 2020-02-13: qty 2

## 2020-02-13 MED ORDER — CLINDAMYCIN PHOSPHATE 600 MG/50ML IV SOLN
600.0000 mg | Freq: Once | INTRAVENOUS | Status: AC
  Administered 2020-02-13: 600 mg via INTRAVENOUS
  Filled 2020-02-13: qty 50

## 2020-02-13 MED ORDER — CLINDAMYCIN HCL 150 MG PO CAPS: 450.0000 mg | ORAL_CAPSULE | Freq: Four times a day (QID) | ORAL | 0 refills | Status: AC

## 2020-02-13 MED FILL — IBUPROFEN 800 MG TAB: 800 | 7 days supply | Qty: 21 | Fill #0

## 2020-02-13 MED FILL — CLINDAMYCIN HCL 150 MG CAP: 150 | 7 days supply | Qty: 84 | Fill #0

## 2020-02-13 NOTE — ED Triage Notes (Signed)
Patient complains of right hand pain and swelling x 1 day. denies trauma, states started after poison ivy exposure

## 2020-02-13 NOTE — Care Management (Signed)
ED CM contacted concerning medication assistance.  Patient is being discharged on Clinda 150mg  PO,  Patient is uninsured and is unable to afford medication. CM suggested to [facility] OP pharmacy, where meds will be delivered to ED prior to discharge.  CM enrolled patient in [facility] program [facility] pharmacy updated. Patient instructed to f/u with [facility] at the [facility].  No further ED TOC needs identified.

## 2020-02-13 NOTE — ED Notes (Signed)
Patient verbalizes understanding of discharge instructions. Opportunity for questioning and answers were provided. Pt discharged from ED. 
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
## 2020-02-13 NOTE — Discharge Instructions (Addendum)
You have been seen today for hand pain. Please read and follow all provided instructions. Return to the emergency room for worsening condition or new concerning symptoms.    You likely have cellulitis in your hand which is causing the pain and redness.  1. Medications:  Prescription given to you for clindamycin.  This is an antibiotic.  Please take this as prescribed. -You can also take ibuprofen as needed for pain and swelling.  Do not take any additional Aleve, Motrin, Goody powders, ibuprofen as his medicines are similar.  Continue usual home medications Take medications as prescribed. Please review all of the medicines and only take them if you do not have an allergy to them.   2. Treatment: rest, drink plenty of fluids  3. Follow Up:  Please follow up with primary care provider by scheduling an appointment in 2 days for wound check   It is also a possibility that you have an allergic reaction to any of the medicines that you have been prescribed - Everybody reacts differently to medications and while MOST people have no trouble with most medicines, you may have a reaction such as nausea, vomiting, rash, swelling, shortness of breath. If this is the case, please stop taking the medicine immediately and contact your physician.   Substance Abuse Treatment Programs  Intensive Outpatient Programs [facility]     [address]      [place], [place]                   [phone]       [facility] [address] [place] [address] [place], [place] [phone]  [name]     (Inpatient and outpatient)     [address]           [phone]    [facility] [phone] (Suboxone and Methadone)  [address]      [place], [place] [address]      [phone]       [address] [place], [place] [phone]  [facility] (Outpatient/Inpatient, Chemical)    (insurance only)  [phone]             [facility] (Groups & Residential) [place], [place] [phone]     [facility]     [address]     [place], [place]      [phone]       [facility] (for caregivers and family) [phone] [address] [place], [place] [phone]      Residential Treatment Programs [facility]      [address] [place], [place] [address]  [phone]       [facility] [address], [place], [place] [address] [phone]  [facility] [place]        [phone]       [facility] [phone]  [facility] (Addiction [facility]             [address]                                         [place], [place]  [phone] or [place] of [place], [phone]  [facility] [place]    [address]      [place], [place]       [facility] [place], [place]  [place]   [address] [place], [place] [address]     [phone]      Admissions: 8am-3pm M-F  [facility]idential Treatment Services (RTS) [address] [place], [place]  BATS Program: Residential Program [phone] Days)   [place], [place] or [phone]     [facility]C: [facility] [place], [place] (Walk in Hours over the weekend or by referral)  [facility] [place], [place], [place] [address] [phone]  Crisis Mobile: [facility]:  8584512192 (for crisis response 24 hours a day) [facility] Hotline:      [phone] Outpatient Psychiatry and Counseling  [facility]: Mobile Crisis Management 24 hours:  [phone]  [facility] of the [employer] sliding scale fee and walk in schedule: M-F  8am-12pm/1pm-3pm [place], [place] [address] [address] [place], [place] [address] [phone]  [facility] (Formerly known as [facility] [employer])- new patient walk-in appointments available Monday - Friday 8am -3pm.          [address] [place], [place] [address] [phone] or crisis line- [place] Services/ Intensive Outpatient Therapy Program [place], [place] [address] [place]      [phone] [address] [place], [place] [address]                 [place]   [facility] [phone]. [place], [place] [address]   [employer] of Care          [address]ette Abraham  [place], [place] [address]       [phone]  [place], [place], [place] [address] [phone]  [facility]    [address] [place], [place] [address]     [place], [place] Joycelyn Man     [place] [address]     [phone]       [facility] [place] [address]  [facility]     [address] [place], [place], [place] [address] [place], [place] [address] [place]     [address]     [place], [place] [address]     [phone]       [facility] [address] [place], [place] [address] [phone] Resources for Temporary  Residential Assistance/Crisis Theme park manager [facility] [place]) M-F 8am-3pm   [address] [place], [place] [address]   [phone] Services include: laundry, barbering, support  groups, case management, phone  & computer access, showers, AA/NA mtgs, mental health/substance abuse nurse, job skills class, disability information, VA assistance, spiritual classes, etc.   HOMELESS SHELTERS  [facility]     [facility]   [address] [place]     [phone]              [employer] (women and children)       [address] [place], [place] [address] [phone] [email or web address] for application and process Application Required  [facility] [employer] [facility]   [address]    [place] [address]     [phone]                    [facility] of [place] [address] [place], [place] [address] [phone] application appt.) Application Required  [facility] (women only)    [address]     [place], [place] [address]     [phone]      Intake starts 6pm daily Need valid ID, SSC, & Police report [employer] [address] [place], [place] [phone] Application Required  [employer] (men only)     [address]      [place], [place]     [phone]       [facility] At [facility] of the [place] (Pregnant women only) [address] [place], [place] [phone]  [facility]      [address]      [place], [place] [address]     [phone]             [facility] [address] [place], [place] [phone] 90 day commitment/SA/Application process  [facility](men only)     [address]     [place], [place]     [phone]       Check-in at [facility] [address] [place], [place] [address] [phone] Men/Women/Women and Children must be there by 7 pm  [facility] [place], [place] [phone]

## 2020-02-18 LAB — CULTURE, BLOOD (ROUTINE X 2)
Culture: NO GROWTH
Culture: NO GROWTH
Special Requests: ADEQUATE

## 2020-11-18 ENCOUNTER — Emergency Department (HOSPITAL_COMMUNITY)
Admission: EM | Admit: 2020-11-18 | Discharge: 2020-11-18 | Disposition: A | Discharge status: Home / Self Care | Payer: Medicaid Other

## 2021-02-27 ENCOUNTER — Other Ambulatory Visit: Payer: Self-pay

## 2021-03-23 ENCOUNTER — Emergency Department (HOSPITAL_COMMUNITY)
Admission: EM | Admit: 2021-03-23 | Discharge: 2021-03-23 | Disposition: A | Discharge status: Home / Self Care | Payer: Medicaid Other | Attending: Emergency Medicine | Admitting: Emergency Medicine

## 2021-03-23 ENCOUNTER — Emergency Department (HOSPITAL_COMMUNITY): Payer: Medicaid Other

## 2021-03-23 DIAGNOSIS — R319 Hematuria, unspecified: Secondary | ICD-10-CM | POA: Insufficient documentation

## 2021-03-23 DIAGNOSIS — F17210 Nicotine dependence, cigarettes, uncomplicated: Secondary | ICD-10-CM | POA: Insufficient documentation

## 2021-03-23 LAB — URINALYSIS, MICROSCOPIC (REFLEX): Bacteria, UA: NONE SEEN

## 2021-03-23 LAB — URINALYSIS, ROUTINE W REFLEX MICROSCOPIC
Bilirubin Urine: NEGATIVE
Glucose, UA: NEGATIVE mg/dL
Hgb urine dipstick: NEGATIVE
Ketones, ur: NEGATIVE mg/dL
Nitrite: NEGATIVE
Protein, ur: NEGATIVE mg/dL
Specific Gravity, Urine: 1.018 (ref 1.005–1.030)
pH: 6 (ref 5.0–8.0)

## 2021-03-23 NOTE — ED Triage Notes (Signed)
Pt c/o hematuria x2wks, dysuria, lower back pain. Hx kidney stones, concerned for same 10/10 pain w urination, denies pain otherwise.

## 2021-03-23 NOTE — Discharge Instructions (Addendum)
You were evaluated in the Emergency Department and after careful evaluation, we did not find any emergent condition requiring admission or further testing in the hospital.  Your exam/testing today was overall reassuring.  Recommend follow-up with the urologist if the bleeding continues.  Please return to the Emergency Department if you experience any worsening of your condition.  Thank you for allowing [place] to be a part of your care.
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
## 2021-03-23 NOTE — ED Notes (Signed)
Patient transported to CT 

## 2021-05-05 ENCOUNTER — Other Ambulatory Visit: Payer: Self-pay

## 2021-05-05 MED ORDER — DOXEPIN HCL 75 MG PO CAPS
75.0000 mg | ORAL_CAPSULE | Freq: Every day | ORAL | 1 refills | Status: DC
  Filled 2021-05-05: qty 30, 30d supply, fill #0

## 2021-05-05 MED ORDER — SERTRALINE HCL 50 MG PO TABS
50.0000 mg | ORAL_TABLET | Freq: Every day | ORAL | 1 refills | Status: DC
  Filled 2021-05-05: qty 30, 30d supply, fill #0

## 2021-05-05 MED ORDER — QUETIAPINE FUMARATE 100 MG PO TABS
100.0000 mg | ORAL_TABLET | Freq: Every day | ORAL | 1 refills | Status: DC
  Filled 2021-05-05: qty 30, 30d supply, fill #0

## 2021-05-21 ENCOUNTER — Other Ambulatory Visit: Payer: Self-pay

## 2021-05-21 ENCOUNTER — Ambulatory Visit (HOSPITAL_COMMUNITY)
Admission: EM | Admit: 2021-05-21 | Discharge: 2021-05-21 | Disposition: A | Discharge status: Home / Self Care | Payer: No Payment, Other | Attending: Psychiatry | Admitting: Psychiatry

## 2021-05-21 DIAGNOSIS — Z9151 Personal history of suicidal behavior: Secondary | ICD-10-CM | POA: Insufficient documentation

## 2021-05-21 DIAGNOSIS — F1520 Other stimulant dependence, uncomplicated: Secondary | ICD-10-CM

## 2021-05-21 NOTE — ED Provider Notes (Signed)
Behavioral Health Urgent Care Medical Screening Exam  Patient Name: Trevor [name] MRN: [id number] Date of Evaluation: 05/21/21 Chief Complaint:   Diagnosis:  Final diagnoses:  Methamphetamine use disorder, severe (HCC)    History of Present illness: Trevor [name] is a 40 y.o. male. Trevor [name] is a 40 year old male who presents voluntarily to [facility] behavioral health for walk-in assessment. He states "I have bipolar and addiction, I am really tired of getting high, I have been doing it for so long, over 20 years." Trevor [name] reports he is currently dosed methadone by [facility] in [place] 110 mg daily, last dose today. Additionally he recently relapsed, approximately 7 days ago, on methamphetamine and heroin.  Last use of methamphetamine yesterday, last use of heroin 2 days ago.  He also endorses marijuana and tobacco use.  He reports readiness to stop using substance, he is seeking residential substance use treatment.  He is not ready to stop using methadone states "I would like to eventually come off, but I am not ready."  He is familiar with substance use treatment resources in the area, he is also aware of limited resources who will continue methadone. Additional stressors include he may be evicted from his housing soon as his landlord has decided to stop taking housing vouchers.  He has been given 30 days notice and is actively seeking housing that will accept his voucher currently He is followed by outpatient psychiatry through [facility], Dr Reddy,but is not eligible to return to their substance use program at this time.  He reports he was dismissed from the program a couple of months ago after he "missed too many classes."  He reports compliance with his medications including Seroquel, Zoloft and doxepin.  He is followed by outpatient therapy at [facility] Patient is assessed face-to-face by nurse practitioner.  He is seated in assessment area, no acute distress.  He is alert and  oriented, pleasant and cooperative during assessment.  He reports depressed mood with congruent affect.  He denies suicidal and homicidal ideations.  He endorses 1 prior suicide attempt when he thought of jumping from a bridge but was stopped by the police after his mother died in 01-10-17.  He was treated by inpatient psychiatry at [facility] behavioral health at that time.  He denies history of self-harm. He contracts verbally for safety with this Clinical research associate. He has normal speech and behavior.  He denies both auditory and visual hallucinations.  Patient is able to converse coherently with goal-directed thoughts and no distractibility or preoccupation.  He denies paranoia.  Objectively there is no evidence of psychosis/mania or delusional thinking. He resides in [place], denies access to weapons.  He is currently not awake.  He endorses average sleep and appetite. Patient offered support and encouragement, he denies any person to contact for collateral information at this time.   Psychiatric Specialty Exam  Presentation  General Appearance:Appropriate for Environment; Casual  Eye Contact:Good  Speech:Clear and Coherent; Normal Rate  Speech Volume:Normal  Handedness:Right   Mood and Affect  Mood:Depressed  Affect:Congruent; Appropriate   Thought Process  Thought Processes:Coherent; Goal Directed; Linear  Descriptions of Associations:Intact  Orientation:Full (Time, Place and Person)  Thought Content:Logical; WDL    Hallucinations:None  Ideas of Reference:None  Suicidal Thoughts:No  Homicidal Thoughts:No   Sensorium  Memory:Immediate Good; Recent Good; Remote Good  Judgment:Good  Insight:Fair   Executive Functions  Concentration:Good  Attention Span:Good  Recall:Good  Fund of Knowledge:Good  Language:Good   Psychomotor Activity  Psychomotor Activity:Normal  Assets  Assets:Communication Skills; Desire for Improvement; Financial Resources/Insurance; Housing;  Intimacy; Leisure Time; Physical Health; Resilience   Sleep  Sleep:Good  Number of hours:  No data recorded  Nutritional Assessment (For OBS and FBC admissions only) Has the patient had a weight loss or gain of 10 pounds or more in the last 3 months?: No Has the patient had a decrease in food intake/or appetite?: No Does the patient have dental problems?: No Does the patient have eating habits or behaviors that may be indicators of an eating disorder including binging or inducing vomiting?: No Has the patient recently lost weight without trying?: No Has the patient been eating poorly because of a decreased appetite?: No Malnutrition Screening Tool Score: 0   Physical Exam: Physical Exam Vitals and nursing note reviewed.  Constitutional:      Appearance: Normal appearance. He is well-developed and normal weight.  HENT:     Head: Normocephalic and atraumatic.     Nose: Nose normal.  Cardiovascular:     Rate and Rhythm: Normal rate.  Pulmonary:     Effort: Pulmonary effort is normal.  Musculoskeletal:        General: Normal range of motion.     Cervical back: Normal range of motion.  Skin:    General: Skin is warm and dry.  Neurological:     Mental Status: He is alert and oriented to person, place, and time.  Psychiatric:        Attention and Perception: Attention and perception normal.        Mood and Affect: Affect normal. Mood is depressed.        Speech: Speech normal.        Behavior: Behavior normal. Behavior is cooperative.        Thought Content: Thought content normal.        Cognition and Memory: Cognition and memory normal.        Judgment: Judgment normal.   Review of Systems  Constitutional: Negative.   HENT: Negative.    Eyes: Negative.   Respiratory: Negative.    Cardiovascular: Negative.   Gastrointestinal: Negative.   Genitourinary: Negative.   Musculoskeletal: Negative.   Skin: Negative.   Neurological: Negative.   Endo/Heme/Allergies:  Negative.   Psychiatric/Behavioral:  Positive for depression and substance abuse.   Blood pressure (!) 129/101, pulse (!) 102, temperature 98.9 F (37.2 C), temperature source Oral, resp. rate 18, SpO2 100 %. There is no height or weight on file to calculate BMI.  Musculoskeletal: Strength & Muscle Tone: within normal limits Gait & Station: normal Patient leans: N/A   [facility] MSE Discharge Disposition for Follow up and Recommendations: Based on my evaluation the patient does not appear to have an emergency medical condition and can be discharged with resources and follow up care in outpatient services for Medication Management, Substance Abuse Intensive Outpatient Program, and Individual Therapy Patient reviewed with Dr Bronwen Betters. Continue current medications. Follow-up with established outpatient psychiatry at [facility] and [facility].  Lenard Lance, [name] 05/21/2021, 1:15 PM

## 2021-05-21 NOTE — Progress Notes (Signed)
   05/21/21 1234  [facility] Triage Screening (Walk-ins at [facility] only)  How Did You Hear About [place]? Other (Comment) [facility] Coordinator referred)  What Is the Reason for Your Visit/Call Today? Pt denies SI, HI, AVH and paranoia. Recent use of meth (about 2 x week) and currently in a methodone tx program through [facility]. Pt stated he has been successful at cutting doen from daily use. Pt came in because [facility] Coordinator told him to come in for an evaluation. Hx of IP psych admission at [facility] in 2019 for suicide attempt. Hx of Bipolar and states he is taking meds and is complaint for the last few months. Pt has been seeing Dr. Betti [name] at [facility] until he recently got "kicking out" of the program for missing classes. Pt stated he does "hear chatter" if he is not on his meds. Hx of ADHD. Pt stated "I'm an addict and I just want to get clean."  How Long Has This Been Causing You Problems? > than 6 months (Pt stated he has had problems "all my life.")  Have You Recently Had Any Thoughts About Hurting Yourself? No  Are You Planning to Commit Suicide/Harm Yourself At This time? No  Have you Recently Had Thoughts About Hurting Someone Karolee Ohs? No  Are You Planning To Harm Someone At This Time? No  Are you currently experiencing any auditory, visual or other hallucinations? No  Have You Used Any Alcohol or Drugs in the Past 24 Hours? Yes (Pt states he uses meth about 2 x week and is in a methodone program through [facility] in [place])  How long ago did you use Drugs or Alcohol? yesterday  What Did You Use and How Much? methodone  Do you have any current medical co-morbidities that require immediate attention? Yes  Please describe current medical co-morbidities that require immediate attention: Pt stated he is worried about having Huntington's disease. His father had the illness. Pt stated he has been having uncontrollable movements in his arms and legs. Pt stated, "that's how it started for my  father."  Clinician description of patient physical appearance/behavior: Pt is calm, cooperative and able to answer questions appropriately. He is casually dressed and a bit disheveled. His speech, movement and thought content appear to be within normal limits. Pt seems underweight and pale.  What Do You Feel Would Help You the Most Today? Social Support  If access to [facility] was not available, would you have sought care in the Emergency Department? Yes  Determination of Need Routine (7 days)  Options For Referral Intensive Outpatient Therapy;Medication Management  Donaven Criswell T. Jimmye Norman, MS, [facility], [facility] Triage Specialist [facility]

## 2021-05-21 NOTE — Discharge Instructions (Addendum)
Patient is instructed prior to discharge to:  Take all medications as prescribed by his/her mental healthcare provider. Report any adverse effects and or reactions from the medicines to his/her outpatient provider promptly. Keep all scheduled appointments, to ensure that you are getting refills on time and to avoid any interruption in your medication.  If you are unable to keep an appointment call to reschedule.  Be sure to follow-up with resources and follow-up appointments provided.  Patient has been instructed & cautioned: To not engage in alcohol and or illegal drug use while on prescription medicines. In the event of worsening symptoms, patient is instructed to call the crisis hotline, 911 and or go to the nearest ED for appropriate evaluation and treatment of symptoms. To follow-up with his/her primary care provider for your other medical issues, concerns and or health care needs.    Substance Abuse Treatment Resources listed Below:  [facility] - Admissions are currently completed Monday through Friday at 8am; both appointments and walk-ins are accepted.  Any individual that is a [place] resident may present for a substance abuse screening and assessment for admission.  A person may be referred by numerous sources or self-refer.   Potential clients will be screened for medical necessity and appropriateness for the program.  Clients must meet criteria for high-intensity residential treatment services.  If clinically appropriate, a client will continue with the comprehensive clinical assessment and intake process, as well as enrollment in the [facility].  Address: [address] [place], [place] [address] Admin Hours: Mon-Fri 8AM to 5PM Center Hours: 24/7 Phone: [phone] Fax: [phone]  [facility] Address: [address], [place], [place] [address] Behavioral Health Urgent Care (BHUC) Hours: 24/7 Phone:  [phone] Fax: [phone]  [facility]): (offers outpatient therapy and intensive outpatient substance abuse therapy).  [address], [place], [place] [address] Phone: [phone]  [facility]: Offers FREE recovery skills classes, support groups, 1:1 Peer Support, and Compeer Classes. [address], [place], [place] [address] Phone: [phone] (Call to complete intake).   [facility] [address] [place],  [address] Phone: [phone] ext [phone] [facility] provides food, shelter and other programs and services to the homeless men of [place] through our men's program.  By offering safe shelter, three meals a day, clean clothing, Biblical counseling, financial planning, vocational training, GED/education and employment assistance, we've helped mend the shattered lives of many homeless men since opening in 1974.  We have approximately 267 beds available, with a max of 312 beds including mats for emergency situations and currently house an average of 270 men a night.  Prospective Client Check-In Information Photo ID Required (State/ Out of State/ DOC) - if photo ID is not available, clients are required to have a printout of a police/sheriff's criminal history report. Help out with chores around the Mission. No sex offender of any type (pending, charged, registered and/or any other sex related offenses) will be permitted to check in. Must be willing to abide by all rules, regulations, and policies established by the [facility]. The following will be provided - shelter, food, clothing, and biblical counseling. If you or someone you know is in need of assistance at our men's shelter in [place], , please call [phone] ext. [phone].  [facility]-will provide timely access to mental health services for children and adolescents (4-17) and adults    presenting in a mental health crisis. The program is designed for those who need urgent Behavioral Health or Substance Use treatment and are not experiencing a medical crisis that would typically require an emergency room visit.    [address] [place], [place] [address] Phone: [phone] [email or web address]  [facility]: Phone#: [phone]  The [facility] SA Intensive Outpatient Program (SAIOP) means structured individual and group addiction activities and services that are provided at an outpatient program designed to assist adult and adolescent consumers to begin recovery and learn skills for recovery maintenance. The [facility] SAIOP program is offered at least 3 hours a day, 3 days a week.SAIOP services shall include a structured program consisting of, but not limited to, the following services: Individual counseling and support; Group counseling and support; Family counseling, training or support; Biochemical assays to identify recent drug use (e.g., urine drug screens); Strategies for relapse prevention to include community and social support systems in treatment; Life skills; Crisis contingency planning; Disease Management; and Treatment support activities that have been adapted or specifically designed for persons with physical disabilities, or persons with co-occurring disorders of mental illness and substance abuse/dependence or mental retardation/developmental disability and substance abuse/dependence. Phone: [phone]  Address:   The [place] [facility] will also offer the following outpatient services: (Monday through Friday 8am-5pm)   Partial Hospitalization Program (PHP) Substance Abuse Intensive Outpatient Program (SA-IOP) Group Therapy Medication Management Peer Living Room We also provide (24/7):  Assessments: Our mental health clinician and providers will conduct a focused mental health evaluation, assessing for immediate  safety concerns and further mental health needs. Referral: Our team will provide resources and help connect to community based mental health treatment, when indicated, including psychotherapy, psychiatry, and other specialized behavioral health or substance use disorder services (for those not already in treatment). Transitional Care: Our team providers in person bridging and/or telephonic follow-up during the patient's transition to outpatient services.  [facility] 24-Hour Call Center: [phone] Behavioral Health Crisis Line: [phone]  

## 2021-09-09 ENCOUNTER — Emergency Department (HOSPITAL_COMMUNITY)
Admission: EM | Admit: 2021-09-09 | Discharge: 2021-09-09 | Disposition: A | Discharge status: Home / Self Care | Payer: Self-pay | Attending: Emergency Medicine | Admitting: Emergency Medicine

## 2021-09-09 ENCOUNTER — Emergency Department (HOSPITAL_COMMUNITY): Payer: Self-pay

## 2021-09-09 DIAGNOSIS — Z5321 Procedure and treatment not carried out due to patient leaving prior to being seen by health care provider: Secondary | ICD-10-CM | POA: Insufficient documentation

## 2021-09-09 DIAGNOSIS — R300 Dysuria: Secondary | ICD-10-CM | POA: Insufficient documentation

## 2021-09-09 DIAGNOSIS — R109 Unspecified abdominal pain: Secondary | ICD-10-CM | POA: Insufficient documentation

## 2021-09-09 DIAGNOSIS — N4889 Other specified disorders of penis: Secondary | ICD-10-CM | POA: Insufficient documentation

## 2021-09-09 LAB — CBC WITH DIFFERENTIAL/PLATELET
Abs Immature Granulocytes: 0.02 10*3/uL (ref 0.00–0.07)
Basophils Absolute: 0.1 10*3/uL (ref 0.0–0.1)
Basophils Relative: 1 %
Eosinophils Absolute: 0.2 10*3/uL (ref 0.0–0.5)
Eosinophils Relative: 2 %
HCT: 35.6 % — ABNORMAL LOW (ref 39.0–52.0)
Hemoglobin: 11.9 g/dL — ABNORMAL LOW (ref 13.0–17.0)
Immature Granulocytes: 0 %
Lymphocytes Relative: 37 %
Lymphs Abs: 2.6 10*3/uL (ref 0.7–4.0)
MCH: 28.7 pg (ref 26.0–34.0)
MCHC: 33.4 g/dL (ref 30.0–36.0)
MCV: 85.8 fL (ref 80.0–100.0)
Monocytes Absolute: 0.6 10*3/uL (ref 0.1–1.0)
Monocytes Relative: 8 %
Neutro Abs: 3.7 10*3/uL (ref 1.7–7.7)
Neutrophils Relative %: 52 %
Platelets: 284 10*3/uL (ref 150–400)
RBC: 4.15 MIL/uL — ABNORMAL LOW (ref 4.22–5.81)
RDW: 13.4 % (ref 11.5–15.5)
WBC: 7.2 10*3/uL (ref 4.0–10.5)
nRBC: 0 % (ref 0.0–0.2)

## 2021-09-09 LAB — URINALYSIS, ROUTINE W REFLEX MICROSCOPIC
Bilirubin Urine: NEGATIVE
Glucose, UA: NEGATIVE mg/dL
Hgb urine dipstick: NEGATIVE
Ketones, ur: 5 mg/dL — AB
Leukocytes,Ua: NEGATIVE
Nitrite: NEGATIVE
Protein, ur: 30 mg/dL — AB
Specific Gravity, Urine: 1.033 — ABNORMAL HIGH (ref 1.005–1.030)
pH: 5 (ref 5.0–8.0)

## 2021-09-09 LAB — BASIC METABOLIC PANEL
Anion gap: 12 (ref 5–15)
BUN: 21 mg/dL — ABNORMAL HIGH (ref 6–20)
CO2: 20 mmol/L — ABNORMAL LOW (ref 22–32)
Calcium: 8.7 mg/dL — ABNORMAL LOW (ref 8.9–10.3)
Chloride: 101 mmol/L (ref 98–111)
Creatinine, Ser: 0.86 mg/dL (ref 0.61–1.24)
GFR, Estimated: >60 mL/min (ref >60)
Glucose, Bld: 96 mg/dL (ref 70–99)
Potassium: 5.1 mmol/L (ref 3.5–5.1)
Sodium: 133 mmol/L — ABNORMAL LOW (ref 135–145)

## 2021-09-09 MED ORDER — OXYCODONE-ACETAMINOPHEN 5-325 MG PO TABS
2.0000 | ORAL_TABLET | Freq: Once | ORAL | Status: AC
  Administered 2021-09-09: 2 via ORAL
  Filled 2021-09-09: qty 2

## 2021-09-09 MED ORDER — ONDANSETRON 4 MG PO TBDP
4.0000 mg | ORAL_TABLET | Freq: Once | ORAL | Status: AC
  Administered 2021-09-09: 4 mg via ORAL
  Filled 2021-09-09: qty 1

## 2021-09-09 NOTE — ED Triage Notes (Signed)
Pt c/o pain in abdomen, penis, testicles; states it feels like he's got a kidney stone. Hx of same.

## 2021-09-09 NOTE — ED Notes (Addendum)
Patient has not been placed in room 30, called x 3 in [facility] without response. Was not noted leaving ED by staff in [facility] and patient did not verbalize his intent to leave the ED.

## 2021-09-09 NOTE — ED Provider Notes (Signed)
Emergency Medicine Provider Triage Evaluation Note  Trevor [name] , a 40 y.o. male  was evaluated in triage.  Pt complains of penis pain.  States that it feels like prior kidney stones.  States that he has been "dribbling" when he urinates.  Denies fever, n/v.  Review of Systems  Positive: Penis pain, abdominal pain Negative: Fever, chills  Physical Exam  BP 109/73    Pulse 96    Temp 98 F (36.7 C) (Oral)    Resp 16    SpO2 96%  Gen:   Awake, no distress   Resp:  Normal effort  MSK:   Moves extremities without difficulty  Other:  Normal testes, chaperone present, no masses or lesions or discharge  Medical Decision Making  Medically screening exam initiated at 1:53 AM.  Appropriate orders placed.  CLENTON ESPER was informed that the remainder of the evaluation will be completed by another provider, this initial triage assessment does not replace that evaluation, and the importance of remaining in the ED until their evaluation is complete.  Penis pain, dysuria, abdominal pain   Roxy Horseman, PA-C 09/09/21 0155    Melene Plan, DO 09/09/21 [phone]

## 2021-09-11 ENCOUNTER — Emergency Department (HOSPITAL_COMMUNITY)
Admission: EM | Admit: 2021-09-11 | Discharge: 2021-09-12 | Discharge status: Against Medical Advice | Payer: Medicaid Other | Attending: Emergency Medicine | Admitting: Emergency Medicine

## 2021-09-11 ENCOUNTER — Other Ambulatory Visit: Payer: Self-pay

## 2021-09-11 DIAGNOSIS — R319 Hematuria, unspecified: Secondary | ICD-10-CM | POA: Insufficient documentation

## 2021-09-11 DIAGNOSIS — Z5321 Procedure and treatment not carried out due to patient leaving prior to being seen by health care provider: Secondary | ICD-10-CM | POA: Insufficient documentation

## 2021-09-11 LAB — URINALYSIS, ROUTINE W REFLEX MICROSCOPIC
Bilirubin Urine: NEGATIVE
Glucose, UA: NEGATIVE mg/dL
Hgb urine dipstick: NEGATIVE
Ketones, ur: NEGATIVE mg/dL
Leukocytes,Ua: NEGATIVE
Nitrite: NEGATIVE
Protein, ur: NEGATIVE mg/dL
Specific Gravity, Urine: >1.03 — ABNORMAL HIGH (ref 1.005–1.030)
pH: 6 (ref 5.0–8.0)

## 2021-09-11 LAB — I-STAT CHEM 8, ED
BUN: 13 mg/dL (ref 6–20)
Calcium, Ion: 1.17 mmol/L (ref 1.15–1.40)
Chloride: 102 mmol/L (ref 98–111)
Creatinine, Ser: 0.7 mg/dL (ref 0.61–1.24)
Glucose, Bld: 100 mg/dL — ABNORMAL HIGH (ref 70–99)
HCT: 38 % — ABNORMAL LOW (ref 39.0–52.0)
Hemoglobin: 12.9 g/dL — ABNORMAL LOW (ref 13.0–17.0)
Potassium: 3.9 mmol/L (ref 3.5–5.1)
Sodium: 139 mmol/L (ref 135–145)
TCO2: 29 mmol/L (ref 22–32)

## 2021-09-11 NOTE — ED Triage Notes (Signed)
Pt here for hematuria x2 days, pt denies any dysuria but endorses back pain and mild nausea. Pt has tried tylenol w/o relief. Pt denies fevers.

## 2021-09-11 NOTE — ED Provider Notes (Signed)
Emergency Medicine Provider Triage Evaluation Note  Trevor [name] , a 40 y.o. male  was evaluated in triage.  Pt complains of persistent hematuria over the past 2 days.  Was evaluated in triage for these complaints 2 days ago.  Has taken Tylenol for his symptoms without improvement.  Denies any dysuria at present.  No fevers, new or worsening back pain, vomiting.  Review of Systems  Positive: As above Negative: As above  Physical Exam  BP (!) 138/98 (BP Location: Left Arm)    Pulse 81    Temp 97.7 F (36.5 C) (Oral)    Resp 16    SpO2 99%  Gen:   Awake, no distress   Resp:  Normal effort  MSK:   Moves extremities without difficulty  Other:  Nondistended abdomen. Ambulates without difficulty.  Medical Decision Making  Medically screening exam initiated at 11:25 PM.  Appropriate orders placed.  Trevor [name] was informed that the remainder of the evaluation will be completed by another provider, this initial triage assessment does not replace that evaluation, and the importance of remaining in the ED until their evaluation is complete.  Hematuria    Trevor [name], Trevor [name] 09/11/21 2326    Trevor Hong, MD 09/13/21 [phone]

## 2021-09-12 NOTE — ED Notes (Signed)
Pt did not respond when called for vitals check, nor did pt respond earlier for registration

## 2021-10-13 ENCOUNTER — Emergency Department (HOSPITAL_COMMUNITY)
Admission: EM | Admit: 2021-10-13 | Discharge: 2021-10-14 | Disposition: A | Discharge status: Home / Self Care | Payer: Medicaid Other | Attending: Student | Admitting: Student

## 2021-10-13 ENCOUNTER — Other Ambulatory Visit: Payer: Self-pay

## 2021-10-13 ENCOUNTER — Encounter (HOSPITAL_COMMUNITY): Payer: Self-pay

## 2021-10-13 ENCOUNTER — Emergency Department (HOSPITAL_COMMUNITY): Payer: Medicaid Other

## 2021-10-13 DIAGNOSIS — Z5321 Procedure and treatment not carried out due to patient leaving prior to being seen by health care provider: Secondary | ICD-10-CM | POA: Insufficient documentation

## 2021-10-13 DIAGNOSIS — R1031 Right lower quadrant pain: Secondary | ICD-10-CM | POA: Insufficient documentation

## 2021-10-13 DIAGNOSIS — R1032 Left lower quadrant pain: Secondary | ICD-10-CM | POA: Insufficient documentation

## 2021-10-13 DIAGNOSIS — R319 Hematuria, unspecified: Secondary | ICD-10-CM | POA: Insufficient documentation

## 2021-10-13 NOTE — ED Triage Notes (Signed)
Patient  c/o hematuria and bilateral lower abdominal pain L>R. Patient reports a history of kidney stones.

## 2021-10-13 NOTE — ED Provider Triage Note (Signed)
Emergency Medicine Provider Triage Evaluation Note  Trevor [name] , a 41 y.o. male  was evaluated in triage.  Pt complains of hematuria that started this morning.  Patient states 5 hours ago he passed a stone and has has not urinated since.  Has accompanying mild abdominal pain.  Review of Systems  Positive: Hematuria, mild abdominal pain Negative: Fever  Physical Exam  There were no vitals taken for this visit. Gen:   Awake, no distress   Resp:  Normal effort  MSK:   Moves extremities without difficulty  Other:  Mild generalized abdominal pain  Medical Decision Making  Medically screening exam initiated at 5:51 PM.  Appropriate orders placed.  Trevor [name] was informed that the remainder of the evaluation will be completed by another provider, this initial triage assessment does not replace that evaluation, and the importance of remaining in the ED until their evaluation is complete.  UA and CT renal study   Franchot Heidelberg, PA-C 10/13/21 1800

## 2021-11-05 ENCOUNTER — Emergency Department (HOSPITAL_COMMUNITY)
Admission: EM | Admit: 2021-11-05 | Discharge: 2021-11-05 | Disposition: A | Discharge status: Home / Self Care | Payer: Self-pay | Attending: Emergency Medicine | Admitting: Emergency Medicine

## 2021-11-05 ENCOUNTER — Other Ambulatory Visit: Payer: Self-pay

## 2021-11-05 ENCOUNTER — Encounter (HOSPITAL_COMMUNITY): Payer: Self-pay | Admitting: Emergency Medicine

## 2021-11-05 ENCOUNTER — Emergency Department (HOSPITAL_COMMUNITY): Payer: Self-pay

## 2021-11-05 DIAGNOSIS — M25561 Pain in right knee: Secondary | ICD-10-CM | POA: Insufficient documentation

## 2021-11-05 DIAGNOSIS — G8929 Other chronic pain: Secondary | ICD-10-CM | POA: Insufficient documentation
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
## 2021-11-05 NOTE — ED Triage Notes (Signed)
Pt report right knee pain x20 years. Reports since working more pain has been worse lately.

## 2021-11-05 NOTE — Discharge Instructions (Signed)
At this time there does not appear to be the presence of an emergent medical condition, however there is always the potential for conditions to change. Please read and follow the below instructions.  Please return to the Emergency Department immediately for any new or worsening symptoms. Please be sure to follow up with your Primary Care Provider within one week regarding your visit today; please call their office to schedule an appointment even if you are feeling better for a follow-up visit. You may call the on-call orthopedist Dr. Linna Caprice on your discharge paperwork for further evaluation treatment of your knee pain.  Please use your crutches and knee brace to keep the weight off of your knee to your follow-up appointment.  Please use rest ice and elevation up with your symptoms.  Go to the nearest Emergency Department immediately if: You have fever or chills Your knee swells, and the swelling gets worse. You cannot move your knee. You have very bad knee pain that does not get better with pain medicine. Your knee is red or feels warm when you touch it. You have any new/concerning or worsening of symptoms.   Please read the additional information packets attached to your discharge summary.  Do not take your medicine if  develop an itchy rash, swelling in your mouth or lips, or difficulty breathing; call 911 and seek immediate emergency medical attention if this occurs.  You may review your lab tests and imaging results in their entirety on your MyChart account.  Please discuss all results of fully with your primary care provider and other specialist at your follow-up visit.  Note: Portions of this text may have been transcribed using voice recognition software. Every effort was made to ensure accuracy; however, inadvertent computerized transcription errors may still be present.

## 2022-03-09 ENCOUNTER — Emergency Department (HOSPITAL_COMMUNITY): Payer: Self-pay

## 2022-03-09 ENCOUNTER — Encounter (HOSPITAL_COMMUNITY): Payer: Self-pay

## 2022-03-09 ENCOUNTER — Emergency Department (HOSPITAL_COMMUNITY)
Admission: EM | Admit: 2022-03-09 | Discharge: 2022-03-09 | Disposition: A | Discharge status: Home / Self Care | Payer: Self-pay | Attending: Emergency Medicine | Admitting: Emergency Medicine

## 2022-03-09 DIAGNOSIS — D72829 Elevated white blood cell count, unspecified: Secondary | ICD-10-CM | POA: Insufficient documentation

## 2022-03-09 DIAGNOSIS — N201 Calculus of ureter: Secondary | ICD-10-CM | POA: Insufficient documentation

## 2022-03-09 LAB — URINALYSIS, ROUTINE W REFLEX MICROSCOPIC
Bacteria, UA: NONE SEEN
Bilirubin Urine: NEGATIVE
Glucose, UA: NEGATIVE mg/dL
Ketones, ur: 5 mg/dL — AB
Leukocytes,Ua: NEGATIVE
Nitrite: NEGATIVE
Protein, ur: NEGATIVE mg/dL
Specific Gravity, Urine: 1.023 (ref 1.005–1.030)
pH: 7 (ref 5.0–8.0)

## 2022-03-09 LAB — COMPREHENSIVE METABOLIC PANEL
ALT: 12 U/L (ref 0–44)
AST: 37 U/L (ref 15–41)
Albumin: 4 g/dL (ref 3.5–5.0)
Alkaline Phosphatase: 67 U/L (ref 38–126)
Anion gap: 7 (ref 5–15)
BUN: 13 mg/dL (ref 6–20)
CO2: 27 mmol/L (ref 22–32)
Calcium: 9.1 mg/dL (ref 8.9–10.3)
Chloride: 104 mmol/L (ref 98–111)
Creatinine, Ser: 1 mg/dL (ref 0.61–1.24)
GFR, Estimated: >60 mL/min (ref >60)
Glucose, Bld: 107 mg/dL — ABNORMAL HIGH (ref 70–99)
Potassium: 5.3 mmol/L — ABNORMAL HIGH (ref 3.5–5.1)
Sodium: 138 mmol/L (ref 135–145)
Total Bilirubin: 1.5 mg/dL — ABNORMAL HIGH (ref 0.3–1.2)
Total Protein: 7.6 g/dL (ref 6.5–8.1)

## 2022-03-09 LAB — POTASSIUM: Potassium: 3.7 mmol/L (ref 3.5–5.1)

## 2022-03-09 LAB — CBC
HCT: 40.8 % (ref 39.0–52.0)
Hemoglobin: 13.6 g/dL (ref 13.0–17.0)
MCH: 29.4 pg (ref 26.0–34.0)
MCHC: 33.3 g/dL (ref 30.0–36.0)
MCV: 88.1 fL (ref 80.0–100.0)
Platelets: 279 10*3/uL (ref 150–400)
RBC: 4.63 MIL/uL (ref 4.22–5.81)
RDW: 14 % (ref 11.5–15.5)
WBC: 14 10*3/uL — ABNORMAL HIGH (ref 4.0–10.5)
nRBC: 0 % (ref 0.0–0.2)

## 2022-03-09 LAB — LIPASE, BLOOD: Lipase: 22 U/L (ref 11–51)

## 2022-03-09 MED ORDER — SODIUM CHLORIDE 0.9 % IV BOLUS
1000.0000 mL | Freq: Once | INTRAVENOUS | Status: AC
  Administered 2022-03-09: 1000 mL via INTRAVENOUS

## 2022-03-09 MED ORDER — TAMSULOSIN HCL 0.4 MG PO CAPS: 0.4000 mg | ORAL_CAPSULE | Freq: Every day | ORAL | 0 refills | Status: DC

## 2022-03-09 MED ORDER — ONDANSETRON HCL 4 MG PO TABS: 4.0000 mg | ORAL_TABLET | Freq: Four times a day (QID) | ORAL | 0 refills | Status: DC

## 2022-03-09 MED ORDER — IOHEXOL 300 MG/ML  SOLN
100.0000 mL | Freq: Once | INTRAMUSCULAR | Status: AC | PRN
  Administered 2022-03-09: 100 mL via INTRAVENOUS

## 2022-03-09 MED ORDER — ONDANSETRON HCL 4 MG/2ML IJ SOLN
4.0000 mg | Freq: Once | INTRAMUSCULAR | Status: AC
  Administered 2022-03-09: 4 mg via INTRAVENOUS
  Filled 2022-03-09: qty 2

## 2022-03-09 MED ORDER — KETOROLAC TROMETHAMINE 30 MG/ML IJ SOLN
30.0000 mg | Freq: Once | INTRAMUSCULAR | Status: AC
  Administered 2022-03-09: 30 mg via INTRAVENOUS
  Filled 2022-03-09: qty 1

## 2022-03-09 MED ORDER — FAMOTIDINE IN NACL 20-0.9 MG/50ML-% IV SOLN
20.0000 mg | Freq: Once | INTRAVENOUS | Status: AC
Start: 2022-03-09 — End: 2022-03-09
  Administered 2022-03-09: 20 mg via INTRAVENOUS
  Filled 2022-03-09: qty 50

## 2022-03-09 NOTE — ED Provider Notes (Signed)
[facility]-EMERGENCY DEPT Provider Note   CSN: [id number] Arrival date & time: 03/09/22  0845     History  Chief Complaint  Patient presents with   Abdominal Pain    Trevor [name] is a 41 y.o. male presenting to the ED with a chief complaint of right lower quadrant abdominal pain.  Symptoms began approximately 13 hours ago and woke him up from his sleep.  Reports nausea but denies any vomiting.  Had a normal bowel movement this morning.  Has not taken any medications to help with the symptoms.  No urinary symptoms.  No prior abdominal surgeries.  No fever, chest pain, shortness of breath.   Abdominal Pain Associated symptoms: nausea   Associated symptoms: no chest pain, no chills, no constipation, no cough, no diarrhea, no dysuria, no fever, no hematuria, no shortness of breath, no sore throat and no vomiting        Home Medications Prior to Admission medications   Medication Sig Start Date End Date Taking? Authorizing Provider  ondansetron (ZOFRAN) 4 MG tablet Take 1 tablet (4 mg total) by mouth every 6 (six) hours. 03/09/22  Yes Shekela Goodridge, PA-C  tamsulosin (FLOMAX) 0.4 MG CAPS capsule Take 1 capsule (0.4 mg total) by mouth daily after supper. 03/09/22  Yes Shariff Lasky, PA-C  acetaminophen (TYLENOL) 325 MG tablet Take 2 tablets (650 mg total) by mouth every 6 (six) hours as needed for mild pain, moderate pain or fever. 10/02/19   Caccavale, Sophia, PA-C  atorvastatin (LIPITOR) 20 MG tablet Take 1 tablet (20 mg total) by mouth daily at 6 PM. 07/31/18   Rainville, Burlene Arnt, MD  doxepin (SINEQUAN) 75 MG capsule Take 150 mg by mouth at bedtime.    [provider]  doxepin (SINEQUAN) 75 MG capsule Take 1 capsule (75 mg total) by mouth at bedtime. 12/17/20   Marcellina Millin, MD  gabapentin (NEURONTIN) 400 MG capsule Take 1 capsule (400 mg total) by mouth 3 (three) times daily. 07/31/18   Micheal Likens, MD  hydrOXYzine  (ATARAX/VISTARIL) 50 MG tablet Take 1 tablet (50 mg total) by mouth every 6 (six) hours as needed for anxiety. 07/31/18   Micheal Likens, MD  ibuprofen (ADVIL) 800 MG tablet Take 1 tablet (800 mg total) by mouth every 8 (eight) hours as needed for up to 21 doses. 02/13/20   Shanon Ace, PA-C  Melatonin 5 MG TABS Take 1 tablet (5 mg total) by mouth daily at 8 pm. 07/31/18   Rainville, Burlene Arnt, MD  METHADONE HCL PO Take 115 mg by mouth daily at 6 (six) AM.     [provider]  nicotine (NICODERM CQ - DOSED IN MG/24 HOURS) 21 mg/24hr patch Place 1 patch (21 mg total) onto the skin daily. Patient not taking: Reported on 08/14/2018 07/31/18   Micheal Likens, MD  OLANZapine (ZYPREXA) 10 MG tablet Take 1 tablet (10 mg total) by mouth at bedtime. 07/31/18   Micheal Likens, MD  QUEtiapine (SEROQUEL) 100 MG tablet Take 1 tablet (100 mg total) by mouth at bedtime. 12/17/20   Marcellina Millin, MD  sertraline (ZOLOFT) 100 MG tablet Take 1 tablet (100 mg total) by mouth daily. 08/01/18   Micheal Likens, MD  sertraline (ZOLOFT) 50 MG tablet Take 1 tablet (50 mg total) by mouth daily. 12/17/20   Marcellina Millin, MD      Allergies    Augmentin [amoxicillin-pot clavulanate]    Review of  Systems   Review of Systems  Constitutional:  Negative for appetite change, chills and fever.  HENT:  Negative for ear pain, rhinorrhea, sneezing and sore throat.   Eyes:  Negative for photophobia and visual disturbance.  Respiratory:  Negative for cough, chest tightness, shortness of breath and wheezing.   Cardiovascular:  Negative for chest pain and palpitations.  Gastrointestinal:  Positive for abdominal pain and nausea. Negative for blood in stool, constipation, diarrhea and vomiting.  Genitourinary:  Negative for dysuria, hematuria and urgency.  Musculoskeletal:  Negative for myalgias.  Skin:  Negative for rash.  Neurological:  Negative for  dizziness, weakness and light-headedness.    Physical Exam Updated Vital Signs BP (!) 168/92   Pulse (!) 50   Temp 97.9 F (36.6 C) (Oral)   Resp 18   Ht 6' (1.829 m)   Wt 79.4 kg   SpO2 100%   BMI 23.73 kg/m  Physical Exam Vitals and nursing note reviewed.  Constitutional:      General: He is not in acute distress.    Appearance: He is well-developed.  HENT:     Head: Normocephalic and atraumatic.     Nose: Nose normal.  Eyes:     General: No scleral icterus.       Left eye: No discharge.     Conjunctiva/sclera: Conjunctivae normal.  Cardiovascular:     Rate and Rhythm: Normal rate and regular rhythm.     Heart sounds: Normal heart sounds. No murmur heard.    No friction rub. No gallop.  Pulmonary:     Effort: Pulmonary effort is normal. No respiratory distress.     Breath sounds: Normal breath sounds.  Abdominal:     General: Bowel sounds are normal. There is no distension.     Palpations: Abdomen is soft.     Tenderness: There is abdominal tenderness in the right lower quadrant. There is no guarding.  Musculoskeletal:        General: Normal range of motion.     Cervical back: Normal range of motion and neck supple.  Skin:    General: Skin is warm and dry.     Findings: No rash.  Neurological:     Mental Status: He is alert.     Motor: No abnormal muscle tone.     Coordination: Coordination normal.     ED Results / Procedures / Treatments   Labs (all labs ordered are listed, but only abnormal results are displayed) Labs Reviewed  COMPREHENSIVE METABOLIC PANEL - Abnormal; Notable for the following components:      Result Value   Potassium 5.3 (*)    Glucose, Bld 107 (*)    Total Bilirubin 1.5 (*)    All other components within normal limits  CBC - Abnormal; Notable for the following components:   WBC 14.0 (*)    All other components within normal limits  URINALYSIS, ROUTINE W REFLEX MICROSCOPIC - Abnormal; Notable for the following components:    APPearance HAZY (*)    Hgb urine dipstick SMALL (*)    Ketones, ur 5 (*)    All other components within normal limits  LIPASE, BLOOD  POTASSIUM    EKG None  Radiology CT ABDOMEN PELVIS W CONTRAST  Result Date: 03/09/2022 CLINICAL DATA:  Right lower quadrant pain EXAM: CT ABDOMEN AND PELVIS WITH CONTRAST TECHNIQUE: Multidetector CT imaging of the abdomen and pelvis was performed using the standard protocol following bolus administration of intravenous contrast. RADIATION DOSE REDUCTION: This exam  was performed according to the departmental dose-optimization program which includes automated exposure control, adjustment of the mA and/or kV according to patient size and/or use of iterative reconstruction technique. CONTRAST:  OMNIPAQUE IOHEXOL 300 MG/ML  SOLN COMPARISON:  10/13/2021 FINDINGS: Lower chest: No acute abnormality. Hepatobiliary: No focal liver abnormality is seen. No gallstones, gallbladder wall thickening, or biliary dilatation. Pancreas: Unremarkable. No pancreatic ductal dilatation or surrounding inflammatory changes. Spleen: Normal in size without focal abnormality. Adrenals/Urinary Tract: Adrenal glands are within normal limits. Left kidney demonstrates a normal enhancement pattern. No renal calculi or obstructive changes are seen. Right kidney demonstrates slight delay in enhancement with evidence of hydronephrosis and hydroureter. This extends inferiorly to the level of the ureterovesical junction where a tiny 1-2 mm punctate stone is noted. Perinephric stranding is noted on the right as well. The bladder is well distended. Stomach/Bowel: No obstructive or inflammatory changes of the colon are noted. The appendix is well visualized and within normal limits. Small bowel and stomach are unremarkable. Vascular/Lymphatic: No significant vascular findings are present. No enlarged abdominal or pelvic lymph nodes. Reproductive: Prostate is unremarkable. Other: No abdominal wall hernia or  abnormality. No abdominopelvic ascites. Musculoskeletal: No acute or significant osseous findings. IMPRESSION: 1-2 mm distal right ureteral stone with obstructive changes and perinephric stranding. Normal-appearing appendix. Electronically Signed   By: Alcide Clever M.D.   On: 03/09/2022 20:20    Procedures Procedures    Medications Ordered in ED Medications  famotidine (PEPCID) IVPB 20 mg premix (0 mg Intravenous Stopped 03/09/22 2023)  ondansetron (ZOFRAN) injection 4 mg (4 mg Intravenous Given 03/09/22 1847)  sodium chloride 0.9 % bolus 1,000 mL (0 mLs Intravenous Stopped 03/09/22 2023)  iohexol (OMNIPAQUE) 300 MG/ML solution 100 mL (100 mLs Intravenous Contrast Given 03/09/22 2004)  ketorolac (TORADOL) 30 MG/ML injection 30 mg (30 mg Intravenous Given 03/09/22 2130)    ED Course/ Medical Decision Making/ A&P Clinical Course as of 03/09/22 2137  Tue Mar 09, 2022  1950 WBC(!): 14.0 [HK]  2109 Bacteria, UA: NONE SEEN [HK]  2131 Potassium: 3.7 [HK]    Clinical Course User Index [HK] Dietrich Pates, PA-C                           Medical Decision Making Amount and/or Complexity of Data Reviewed Labs: ordered. Decision-making details documented in ED Course. Radiology: ordered.  Risk Prescription drug management.   42 year old male presenting to the ED with a chief complaint of right lower quadrant abdominal pain.  Symptoms began approximately 13 hours ago and woke him up from his sleep.  Reports associated nausea but denies any vomiting, changes to bowel movements or urination.  Has not taken any medication to help with symptoms.  Upon arrival he is afebrile.  He is tender in the right lower quadrant without rebound or guarding.  Will obtain labs, attempted treat symptomatically and will need CT scan of abdomen pelvis with contrast.  Lab work with leukocytosis of 14.  Urinalysis without evidence of infection, does have small hemoglobin.  Lipase is normal.  CT shows evidence of a 1 to 2 mm  distal right ureteral stone with perinephric stranding.  No evidence of appendicitis or other abnormality.  Potassium level is normal on recheck.  I suspect that his leukocytosis is reactive from his symptoms rather than due to an infection.  He has been afebrile and denies history of fever.  Pain controlled here with Toradol.  Will discharge  with symptomatic treatment at home and urology follow-up.  Patient is agreeable to the plan.  Able to tolerate p.o. intake without difficulty.  Return precautions given.   Patient is hemodynamically stable, in NAD, and able to ambulate in the ED. Evaluation does not show pathology that would require ongoing emergent intervention or inpatient treatment. I explained the diagnosis to the patient. Pain has been managed and has no complaints prior to discharge. Patient is comfortable with above plan and is stable for discharge at this time. All questions were answered prior to disposition. Strict return precautions for returning to the ED were discussed. Encouraged follow up with PCP.   An After Visit Summary was printed and given to the patient.   Portions of this note were generated with Scientist, clinical (histocompatibility and immunogenetics). Dictation errors may occur despite best attempts at proofreading.         Final Clinical Impression(s) / ED Diagnoses Final diagnoses:  Ureterolithiasis    Rx / DC Orders ED Discharge Orders          Ordered    ondansetron (ZOFRAN) 4 MG tablet  Every 6 hours        03/09/22 2137    tamsulosin (FLOMAX) 0.4 MG CAPS capsule  Daily after supper        03/09/22 2137              Dietrich Pates, Cordelia Poche 03/09/22 2137    Glynn Octave, MD 03/09/22 2318

## 2022-03-09 NOTE — ED Provider Triage Note (Signed)
Emergency Medicine Provider Triage Evaluation Note  Trevor [name] , a 41 y.o. male  was evaluated in triage.  Pt complains of abdominal pain x 4 hours. Worse in RLQ. Associated nausea.  Review of Systems  Positive: As above Negative: Vomiting, fever, diarrhea, urinary sx  Physical Exam  BP (!) 134/97 (BP Location: Right Arm)   Pulse 73   Temp 97.9 F (36.6 C) (Oral)   Resp 18   Ht 6' (1.829 m)   Wt 79.4 kg   SpO2 100%   BMI 23.73 kg/m  Gen:   Awake, no distress   Resp:  Normal effort  MSK:   Moves extremities without difficulty  Other:    Medical Decision Making  Medically screening exam initiated at 9:21 AM.  Appropriate orders placed.  Trevor [name] was informed that the remainder of the evaluation will be completed by another provider, this initial triage assessment does not replace that evaluation, and the importance of remaining in the ED until their evaluation is complete.     Su Monks, [place] 03/09/22 8828

## 2022-03-09 NOTE — Discharge Instructions (Addendum)
Your CT scan shows that you have a kidney stone today on your right side. This is likely the cause of your pain today. I have provided you with medications to help manage her symptoms. If your symptoms do not improve in 4 to 5 days you will need to follow-up with urologist listed below. If you develop a fever, worsening pain, uncontrollable vomiting or other severe symptoms you can return to the ER at any time.

## 2022-03-09 NOTE — ED Triage Notes (Deleted)
Failed IV attempt x 2. Pt taken back to ER waiting room. PA notified

## 2022-03-09 NOTE — ED Notes (Signed)
Unable to draw blood from this pt.veins to small.

## 2022-03-09 NOTE — ED Notes (Addendum)
Pt took Bp cuff off. Unable to get accurate temp.

## 2022-03-09 NOTE — ED Triage Notes (Signed)
Pt arrived via EMS, c/o LRQ abd pain. Denies any n/v.

## 2022-03-09 NOTE — ED Notes (Signed)
Unable to get blood work due to not being able to see any good usable veins

## 2022-07-05 ENCOUNTER — Other Ambulatory Visit: Payer: Self-pay

## 2022-07-05 ENCOUNTER — Emergency Department (HOSPITAL_COMMUNITY)
Admission: EM | Admit: 2022-07-05 | Discharge: 2022-07-05 | Discharge status: Against Medical Advice | Payer: Medicaid Other

## 2022-07-05 NOTE — ED Notes (Signed)
Called and no answer.

## 2022-07-05 NOTE — ED Notes (Signed)
Pt called for triage, no response x2 

## 2022-07-05 NOTE — ED Notes (Signed)
Pt called repeatedly for triage, no response 

## 2022-07-05 NOTE — ED Notes (Signed)
No answer when called for triage, x1

## 2023-02-02 ENCOUNTER — Emergency Department (HOSPITAL_COMMUNITY)
Admission: EM | Admit: 2023-02-02 | Discharge: 2023-02-02 | Disposition: A | Discharge status: Home / Self Care | Payer: BLUE CROSS/BLUE SHIELD | Attending: Emergency Medicine | Admitting: Emergency Medicine

## 2023-02-02 ENCOUNTER — Other Ambulatory Visit: Payer: Self-pay

## 2023-02-02 ENCOUNTER — Emergency Department (HOSPITAL_COMMUNITY): Payer: BLUE CROSS/BLUE SHIELD

## 2023-02-02 ENCOUNTER — Encounter (HOSPITAL_COMMUNITY): Payer: Self-pay

## 2023-02-02 DIAGNOSIS — N132 Hydronephrosis with renal and ureteral calculous obstruction: Secondary | ICD-10-CM | POA: Insufficient documentation

## 2023-02-02 DIAGNOSIS — D72829 Elevated white blood cell count, unspecified: Secondary | ICD-10-CM | POA: Insufficient documentation

## 2023-02-02 DIAGNOSIS — R109 Unspecified abdominal pain: Secondary | ICD-10-CM | POA: Diagnosis present

## 2023-02-02 DIAGNOSIS — N200 Calculus of kidney: Secondary | ICD-10-CM

## 2023-02-02 LAB — URINALYSIS, ROUTINE W REFLEX MICROSCOPIC
Bilirubin Urine: NEGATIVE
Glucose, UA: NEGATIVE mg/dL
Ketones, ur: NEGATIVE mg/dL
Leukocytes,Ua: NEGATIVE
Nitrite: NEGATIVE
Protein, ur: NEGATIVE mg/dL
RBC / HPF: >50 RBC/hpf (ref 0–5)
Specific Gravity, Urine: 1.026 (ref 1.005–1.030)
pH: 6 (ref 5.0–8.0)

## 2023-02-02 LAB — BASIC METABOLIC PANEL
Anion gap: 8 (ref 5–15)
BUN: 20 mg/dL (ref 6–20)
CO2: 26 mmol/L (ref 22–32)
Calcium: 9.1 mg/dL (ref 8.9–10.3)
Chloride: 105 mmol/L (ref 98–111)
Creatinine, Ser: 0.8 mg/dL (ref 0.61–1.24)
GFR, Estimated: >60 mL/min (ref >60)
Glucose, Bld: 114 mg/dL — ABNORMAL HIGH (ref 70–99)
Potassium: 3.5 mmol/L (ref 3.5–5.1)
Sodium: 139 mmol/L (ref 135–145)

## 2023-02-02 LAB — CBC WITH DIFFERENTIAL/PLATELET
Abs Immature Granulocytes: 0.05 10*3/uL (ref 0.00–0.07)
Basophils Absolute: 0.1 10*3/uL (ref 0.0–0.1)
Basophils Relative: 0 %
Eosinophils Absolute: 0 10*3/uL (ref 0.0–0.5)
Eosinophils Relative: 0 %
HCT: 41.5 % (ref 39.0–52.0)
Hemoglobin: 13.9 g/dL (ref 13.0–17.0)
Immature Granulocytes: 0 %
Lymphocytes Relative: 6 %
Lymphs Abs: 0.9 10*3/uL (ref 0.7–4.0)
MCH: 30.2 pg (ref 26.0–34.0)
MCHC: 33.5 g/dL (ref 30.0–36.0)
MCV: 90.2 fL (ref 80.0–100.0)
Monocytes Absolute: 0.5 10*3/uL (ref 0.1–1.0)
Monocytes Relative: 4 %
Neutro Abs: 13.4 10*3/uL — ABNORMAL HIGH (ref 1.7–7.7)
Neutrophils Relative %: 90 %
Platelets: 229 10*3/uL (ref 150–400)
RBC: 4.6 MIL/uL (ref 4.22–5.81)
RDW: 14 % (ref 11.5–15.5)
WBC: 15 10*3/uL — ABNORMAL HIGH (ref 4.0–10.5)
nRBC: 0 % (ref 0.0–0.2)

## 2023-02-02 MED ORDER — ONDANSETRON HCL 4 MG/2ML IJ SOLN
4.0000 mg | Freq: Once | INTRAMUSCULAR | Status: AC
  Administered 2023-02-02: 4 mg via INTRAVENOUS
  Filled 2023-02-02: qty 2

## 2023-02-02 MED ORDER — SODIUM CHLORIDE 0.9 % IV BOLUS
1000.0000 mL | Freq: Once | INTRAVENOUS | Status: AC
  Administered 2023-02-02: 1000 mL via INTRAVENOUS

## 2023-02-02 MED ORDER — NAPROXEN 500 MG PO TABS: 500.0000 mg | ORAL_TABLET | Freq: Two times a day (BID) | ORAL | 0 refills | Status: DC

## 2023-02-02 MED ORDER — ONDANSETRON HCL 4 MG PO TABS: 4.0000 mg | ORAL_TABLET | Freq: Four times a day (QID) | ORAL | 0 refills | Status: DC

## 2023-02-02 MED ORDER — KETOROLAC TROMETHAMINE 30 MG/ML IJ SOLN
30.0000 mg | Freq: Once | INTRAMUSCULAR | Status: AC
  Administered 2023-02-02: 30 mg via INTRAVENOUS
  Filled 2023-02-02: qty 1

## 2023-02-02 NOTE — Discharge Instructions (Addendum)
You have been seen and discharged from the emergency department.  You were found to have a kidney stone on the right without any complication.  Take pain and nausea medicine as needed.  Stay well-hydrated.  Establish care with urology and follow-up with your primary provider for further evaluation and further care. Take home medications as prescribed. If you have any worsening symptoms or further concerns for your health please return to an emergency department for further evaluation.

## 2023-02-02 NOTE — ED Triage Notes (Signed)
C/o right flank pain x 1.5hr Denies n/v, urinary s/sy, hematuria.  Pt states "pain feels the same as kidney stone I had a year ago"

## 2023-02-02 NOTE — ED Provider Notes (Signed)
[place] EMERGENCY DEPARTMENT AT [facility] Provider Note   CSN: [id number] Arrival date & time: 02/02/23  1255     History  Chief Complaint  Patient presents with   Flank Pain    Trevor [name] is a 42 y.o. male.  HPI   42 year old male with past medical history of kidney stones presents emergency department with right-sided flank pain.  Patient states that this has been going on for the past couple days associated with hematuria, dysuria and frequency.  Patient does not have outpatient follow-up with urology.  Patient states that he has passed all of his previous kidney stones and has not needed a procedure/surgery.  Denies any fever but endorses mild nausea without vomiting.  Home Medications Prior to Admission medications   Medication Sig Start Date End Date Taking? Authorizing Provider  naproxen (NAPROSYN) 500 MG tablet Take 1 tablet (500 mg total) by mouth 2 (two) times daily. 02/02/23  Yes Kailynne Ferrington, Clabe Seal, DO  ondansetron (ZOFRAN) 4 MG tablet Take 1 tablet (4 mg total) by mouth every 6 (six) hours. 02/02/23  Yes Charlies Rayburn, Clabe Seal, DO  acetaminophen (TYLENOL) 325 MG tablet Take 2 tablets (650 mg total) by mouth every 6 (six) hours as needed for mild pain, moderate pain or fever. 10/02/19   Caccavale, Sophia, PA-C  atorvastatin (LIPITOR) 20 MG tablet Take 1 tablet (20 mg total) by mouth daily at 6 PM. 07/31/18   Rainville, Burlene Arnt, MD  doxepin (SINEQUAN) 75 MG capsule Take 150 mg by mouth at bedtime.    [provider]  doxepin (SINEQUAN) 75 MG capsule Take 1 capsule (75 mg total) by mouth at bedtime. 12/17/20   Marcellina Millin, MD  gabapentin (NEURONTIN) 400 MG capsule Take 1 capsule (400 mg total) by mouth 3 (three) times daily. 07/31/18   Micheal Likens, MD  hydrOXYzine (ATARAX/VISTARIL) 50 MG tablet Take 1 tablet (50 mg total) by mouth every 6 (six) hours as needed for anxiety. 07/31/18   Micheal Likens, MD  ibuprofen  (ADVIL) 800 MG tablet Take 1 tablet (800 mg total) by mouth every 8 (eight) hours as needed for up to 21 doses. 02/13/20   Shanon Ace, PA-C  Melatonin 5 MG TABS Take 1 tablet (5 mg total) by mouth daily at 8 pm. 07/31/18   Rainville, Burlene Arnt, MD  METHADONE HCL PO Take 115 mg by mouth daily at 6 (six) AM.     [provider]  nicotine (NICODERM CQ - DOSED IN MG/24 HOURS) 21 mg/24hr patch Place 1 patch (21 mg total) onto the skin daily. Patient not taking: Reported on 08/14/2018 07/31/18   Micheal Likens, MD  OLANZapine (ZYPREXA) 10 MG tablet Take 1 tablet (10 mg total) by mouth at bedtime. 07/31/18   Micheal Likens, MD  QUEtiapine (SEROQUEL) 100 MG tablet Take 1 tablet (100 mg total) by mouth at bedtime. 12/17/20   Marcellina Millin, MD  sertraline (ZOLOFT) 100 MG tablet Take 1 tablet (100 mg total) by mouth daily. 08/01/18   Micheal Likens, MD  sertraline (ZOLOFT) 50 MG tablet Take 1 tablet (50 mg total) by mouth daily. 12/17/20   Marcellina Millin, MD  tamsulosin (FLOMAX) 0.4 MG CAPS capsule Take 1 capsule (0.4 mg total) by mouth daily after supper. 03/09/22   Khatri, Hina, PA-C      Allergies    Augmentin [amoxicillin-pot clavulanate]    Review of Systems   Review of Systems  Constitutional:  Negative for fever.  Respiratory:  Negative for shortness of breath.   Cardiovascular:  Negative for chest pain.  Gastrointestinal:  Positive for nausea. Negative for diarrhea and vomiting.  Genitourinary:  Positive for dysuria, flank pain, frequency and hematuria.  Skin:  Negative for rash.  Neurological:  Negative for headaches.    Physical Exam Updated Vital Signs BP (!) 167/85 (BP Location: Right Arm)   Pulse 65   Temp (!) 97.5 F (36.4 C) (Oral)   Resp 18   Ht 6' (1.829 m)   Wt 77.1 kg   SpO2 99%   BMI 23.06 kg/m  Physical Exam Vitals and nursing note reviewed.  Constitutional:      General: He is not in acute  distress.    Appearance: Normal appearance.  HENT:     Head: Normocephalic.     Mouth/Throat:     Mouth: Mucous membranes are moist.  Cardiovascular:     Rate and Rhythm: Normal rate.  Pulmonary:     Effort: Pulmonary effort is normal. No respiratory distress.  Abdominal:     General: Bowel sounds are normal.     Palpations: Abdomen is soft.     Tenderness: There is right CVA tenderness. There is no guarding.  Skin:    General: Skin is warm.  Neurological:     Mental Status: He is alert and oriented to person, place, and time. Mental status is at baseline.  Psychiatric:        Mood and Affect: Mood normal.     ED Results / Procedures / Treatments   Labs (all labs ordered are listed, but only abnormal results are displayed) Labs Reviewed  URINALYSIS, ROUTINE W REFLEX MICROSCOPIC - Abnormal; Notable for the following components:      Result Value   APPearance CLOUDY (*)    Hgb urine dipstick LARGE (*)    Bacteria, UA RARE (*)    All other components within normal limits  CBC WITH DIFFERENTIAL/PLATELET - Abnormal; Notable for the following components:   WBC 15.0 (*)    Neutro Abs 13.4 (*)    All other components within normal limits  BASIC METABOLIC PANEL - Abnormal; Notable for the following components:   Glucose, Bld 114 (*)    All other components within normal limits    EKG None  Radiology CT Renal Stone Study  Result Date: 02/02/2023 CLINICAL DATA:  Abdominal/flank pain.  Stone suspected. EXAM: CT ABDOMEN AND PELVIS WITHOUT CONTRAST TECHNIQUE: Multidetector CT imaging of the abdomen and pelvis was performed following the standard protocol without IV contrast. RADIATION DOSE REDUCTION: This exam was performed according to the departmental dose-optimization program which includes automated exposure control, adjustment of the mA and/or kV according to patient size and/or use of iterative reconstruction technique. COMPARISON:  CT examination dated March 09, 2022 FINDINGS:  Lower chest: No acute abnormality. Hepatobiliary: No focal liver abnormality is seen. No gallstones, gallbladder wall thickening, or biliary dilatation. Pancreas: Unremarkable. No pancreatic ductal dilatation or surrounding inflammatory changes. Spleen: Normal in size without focal abnormality. Adrenals/Urinary Tract: Adrenal glands are unremarkable. There is mild right hydroureteronephrosis secondary to a 3 x 3 mm calculus in the distal right ureter near the ureterovesical junction. Left kidney and ureter are normal. Bladder is unremarkable. Stomach/Bowel: Stomach is within normal limits. Appendix appears normal. No evidence of bowel wall thickening, distention, or inflammatory changes. Vascular/Lymphatic: No significant vascular findings are present. No enlarged abdominal or pelvic lymph nodes. Reproductive: Prostate is unremarkable. Other: No abdominal wall  hernia or abnormality. No abdominopelvic ascites. 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 IMPRESSION: 1. 3 x 3 mm calculus in the distal right ureter near the ureterovesical junction causing mild right hydroureteronephrosis. 2. 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 Electronically Signed   By: Larose Hires D.O.   On: 02/02/2023 14:23    Procedures Procedures    Medications Ordered in ED Medications  sodium chloride 0.9 % bolus 1,000 mL (1,000 mLs Intravenous New Bag/Given 02/02/23 1351)  ondansetron (ZOFRAN) injection 4 mg (4 mg Intravenous Given 02/02/23 1351)  ketorolac (TORADOL) 30 MG/ML injection 30 mg (30 mg Intravenous Given 02/02/23 1351)    ED Course/ Medical Decision Making/ A&P                             Medical Decision Making Amount and/or Complexity of Data Reviewed Labs: ordered. Radiology: ordered.  Risk Prescription drug management.   42 year old male presents emergency department for right-sided flank pain, urinary symptoms.  Vital signs are  stable on arrival.  He has right CVA tenderness but abdomen is otherwise benign.  Urinalysis has a large amount of blood.  Blood work is otherwise reassuring with minimal leukocytosis of 15 and normal kidney function.  CT shows a 3 mm right-sided kidney stone down at the UVJ with some mild right hydronephrosis.  After IV medication patient feels improved, has been able to tolerate p.o.  Will plan for outpatient symptomatic treatment and urology follow-up.  Patient specifically requested no opiate medication.  Patient at this time appears safe and stable for discharge and close outpatient follow up. Discharge plan and strict return to ED precautions discussed, patient verbalizes understanding and agreement.        Final Clinical Impression(s) / ED Diagnoses Final diagnoses:  Kidney stone    Rx / DC Orders ED Discharge Orders          Ordered    naproxen (NAPROSYN) 500 MG tablet  2 times daily        02/02/23 1526    ondansetron (ZOFRAN) 4 MG tablet  Every 6 hours        02/02/23 1526              Chandler Stofer, Clabe Seal, DO 02/02/23 1532

## 2023-03-23 ENCOUNTER — Other Ambulatory Visit: Payer: Self-pay

## 2023-03-23 ENCOUNTER — Encounter (HOSPITAL_COMMUNITY): Payer: Self-pay | Admitting: Emergency Medicine

## 2023-03-23 ENCOUNTER — Emergency Department (HOSPITAL_COMMUNITY)
Admission: EM | Admit: 2023-03-23 | Discharge: 2023-03-23 | Disposition: A | Discharge status: Home / Self Care | Payer: BLUE CROSS/BLUE SHIELD | Attending: Emergency Medicine | Admitting: Emergency Medicine

## 2023-03-23 ENCOUNTER — Emergency Department (HOSPITAL_COMMUNITY): Payer: BLUE CROSS/BLUE SHIELD

## 2023-03-23 DIAGNOSIS — L089 Local infection of the skin and subcutaneous tissue, unspecified: Secondary | ICD-10-CM

## 2023-03-23 DIAGNOSIS — G8929 Other chronic pain: Secondary | ICD-10-CM

## 2023-03-23 DIAGNOSIS — M25561 Pain in right knee: Secondary | ICD-10-CM | POA: Insufficient documentation

## 2023-03-23 MED ORDER — DOXYCYCLINE HYCLATE 100 MG PO CAPS: 100.0000 mg | ORAL_CAPSULE | Freq: Two times a day (BID) | ORAL | 0 refills | Status: DC

## 2023-03-23 MED ORDER — IBUPROFEN 600 MG PO TABS: 600.0000 mg | ORAL_TABLET | Freq: Four times a day (QID) | ORAL | 0 refills | Status: DC | PRN

## 2023-03-23 MED ORDER — IBUPROFEN 800 MG PO TABS
800.0000 mg | ORAL_TABLET | Freq: Once | ORAL | Status: AC
  Administered 2023-03-23: 800 mg via ORAL
  Filled 2023-03-23: qty 1

## 2023-03-23 MED ORDER — SODIUM CHLORIDE 0.9 % IV BOLUS
500.0000 mL | Freq: Once | INTRAVENOUS | Status: AC
  Administered 2023-03-23: 500 mL via INTRAVENOUS

## 2023-03-23 MED ORDER — DOXYCYCLINE HYCLATE 100 MG PO TABS
100.0000 mg | ORAL_TABLET | Freq: Once | ORAL | Status: AC
  Administered 2023-03-23: 100 mg via ORAL
  Filled 2023-03-23: qty 1

## 2023-03-23 NOTE — ED Triage Notes (Signed)
Pt brought in by [employer] for clearance for jail. Pt was found asleep in car, suspected drug use. Pt uncooperative and had to be placed on ground. Pt has known knee injury and complaining of pain there. Ambulatory to room.

## 2023-03-23 NOTE — ED Provider Notes (Signed)
[place] EMERGENCY DEPARTMENT AT [facility] Provider Note   CSN: [id number] Arrival date & time: 03/23/23  2952     History  Chief Complaint  Patient presents with   Knee Pain   Addiction Problem    Trevor [name] is a 42 y.o. male.  HPI   42 year old male presents emergency department after concern of substance abuse and right knee pain.  [employer] states that they found the patient sleeping in his car with large amounts of drug on his lap.  The patient states that he went to sleep in his car last night because he had nowhere else to go but denies any drug or alcohol use.  Please state that he was easily arousable but became erratic and combative.  They had to force him to the ground for rest.  Patient states he has worsening right knee pain from this altercation.  Otherwise from an overdose/drug standpoint the patient states that he feels back to baseline, does not feel intoxicated, has no concern for OD.  He denies any other acute symptoms besides right knee pain.  He is asking to eat and drink.  Home Medications Prior to Admission medications   Medication Sig Start Date End Date Taking? Authorizing Provider  acetaminophen (TYLENOL) 325 MG tablet Take 2 tablets (650 mg total) by mouth every 6 (six) hours as needed for mild pain, moderate pain or fever. 10/02/19   Caccavale, Sophia, PA-C  atorvastatin (LIPITOR) 20 MG tablet Take 1 tablet (20 mg total) by mouth daily at 6 PM. 07/31/18   Rainville, Burlene Arnt, MD  doxepin (SINEQUAN) 75 MG capsule Take 150 mg by mouth at bedtime.    [provider]  doxepin (SINEQUAN) 75 MG capsule Take 1 capsule (75 mg total) by mouth at bedtime. 12/17/20   Marcellina Millin, MD  gabapentin (NEURONTIN) 400 MG capsule Take 1 capsule (400 mg total) by mouth 3 (three) times daily. 07/31/18   Micheal Likens, MD  hydrOXYzine (ATARAX/VISTARIL) 50 MG tablet Take 1 tablet (50 mg total) by mouth every 6 (six) hours as needed for  anxiety. 07/31/18   Micheal Likens, MD  ibuprofen (ADVIL) 800 MG tablet Take 1 tablet (800 mg total) by mouth every 8 (eight) hours as needed for up to 21 doses. 02/13/20   Shanon Ace, PA-C  Melatonin 5 MG TABS Take 1 tablet (5 mg total) by mouth daily at 8 pm. 07/31/18   Rainville, Burlene Arnt, MD  METHADONE HCL PO Take 115 mg by mouth daily at 6 (six) AM.     [provider]  naproxen (NAPROSYN) 500 MG tablet Take 1 tablet (500 mg total) by mouth 2 (two) times daily. 02/02/23   Deneisha Dade M, DO  nicotine (NICODERM CQ - DOSED IN MG/24 HOURS) 21 mg/24hr patch Place 1 patch (21 mg total) onto the skin daily. Patient not taking: Reported on 08/14/2018 07/31/18   Micheal Likens, MD  OLANZapine (ZYPREXA) 10 MG tablet Take 1 tablet (10 mg total) by mouth at bedtime. 07/31/18   Micheal Likens, MD  ondansetron (ZOFRAN) 4 MG tablet Take 1 tablet (4 mg total) by mouth every 6 (six) hours. 02/02/23   Tanishka Drolet, Danford Bad M, DO  QUEtiapine (SEROQUEL) 100 MG tablet Take 1 tablet (100 mg total) by mouth at bedtime. 12/17/20   Marcellina Millin, MD  sertraline (ZOLOFT) 100 MG tablet Take 1 tablet (100 mg total) by mouth daily. 08/01/18   Micheal Likens, MD  sertraline (ZOLOFT) 50 MG tablet Take 1 tablet (50 mg total) by mouth daily. 12/17/20   Marcellina Millin, MD  tamsulosin (FLOMAX) 0.4 MG CAPS capsule Take 1 capsule (0.4 mg total) by mouth daily after supper. 03/09/22   Khatri, Hina, PA-C      Allergies    Augmentin [amoxicillin-pot clavulanate]    Review of Systems   Review of Systems  Constitutional:  Negative for fever.  Respiratory:  Negative for shortness of breath.   Cardiovascular:  Negative for chest pain.  Gastrointestinal:  Negative for abdominal pain, diarrhea and vomiting.  Musculoskeletal:  Negative for neck pain.       + right knee pain  Skin:  Negative for rash.  Neurological:  Negative for headaches.    Physical  Exam Updated Vital Signs BP 118/83   Pulse 68   Resp 16   SpO2 100%  Physical Exam Vitals and nursing note reviewed.  Constitutional:      General: He is not in acute distress.    Appearance: Normal appearance. He is not ill-appearing.  HENT:     Head: Normocephalic.     Mouth/Throat:     Mouth: Mucous membranes are moist.  Cardiovascular:     Rate and Rhythm: Normal rate.  Pulmonary:     Effort: Pulmonary effort is normal. No respiratory distress.     Breath sounds: No wheezing or rales.  Musculoskeletal:     Comments: Tenderness to palpation of the right knee diffusely, no popliteal fossa pain, joint feels stable on ligament evaluation, leg is neurovascularly intact at baseline.  Skin:    General: Skin is warm.  Neurological:     Mental Status: He is alert and oriented to person, place, and time. Mental status is at baseline.  Psychiatric:        Mood and Affect: Mood normal.     ED Results / Procedures / Treatments   Labs (all labs ordered are listed, but only abnormal results are displayed) Labs Reviewed - No data to display  EKG None  Radiology No results found.  Procedures Procedures    Medications Ordered in ED Medications  ibuprofen (ADVIL) tablet 800 mg (has no administration in time range)  sodium chloride 0.9 % bolus 500 mL (has no administration in time range)    ED Course/ Medical Decision Making/ A&P                             Medical Decision Making Amount and/or Complexity of Data Reviewed Radiology: ordered.  Risk Prescription drug management.   42 year old make here with [employer] for concern of substance abuse and right knee pain after being brought to the ground in arrest process. Concern from police that the patient was found sleeping in his car with drug paraphernalia on his lap.  However the patient was easily arousable but then became combative and erratic with police.  Knee x-ray shows small joint effusion but continued chronic  disease.  Patient was given a dose of ibuprofen for discomfort.  Has been able to eat or drink, received IV fluids for hydration.  He has clear appropriate speech, no findings of acute intoxication at this time.  He appears baseline.  Also notable is that he has a small red raised area that looks like a localized area of cellulitis in the scalp without any fluctuance or findings of abscess.  Will treat with antibiotics.  Patient at this time  appears safe and stable for discharge and close outpatient follow up. Discharge plan and strict return to ED precautions discussed, patient verbalizes understanding and agreement.        Final Clinical Impression(s) / ED Diagnoses Final diagnoses:  None    Rx / DC Orders ED Discharge Orders     None         Rozelle Logan, DO 03/23/23 1106

## 2023-03-23 NOTE — Discharge Instructions (Signed)
You have been seen and discharged from the emergency department.  Your x-ray showed no acute injury.  You were given IV fluids and ibuprofen.  You have been placed on doxycycline for skin infection of the scalp. Take as directed.  Follow-up with your primary provider for further evaluation and further care. Take home medications as prescribed. If you have any worsening symptoms or further concerns for your health please return to an emergency department for further evaluation.

## 2023-06-15 ENCOUNTER — Emergency Department (HOSPITAL_COMMUNITY)
Admission: EM | Admit: 2023-06-15 | Discharge: 2023-06-15 | Disposition: A | Discharge status: Home / Self Care | Payer: BLUE CROSS/BLUE SHIELD | Attending: Emergency Medicine | Admitting: Emergency Medicine

## 2023-06-15 ENCOUNTER — Encounter (HOSPITAL_COMMUNITY): Payer: Self-pay

## 2023-06-15 ENCOUNTER — Other Ambulatory Visit: Payer: Self-pay

## 2023-06-15 DIAGNOSIS — W57XXXA Bitten or stung by nonvenomous insect and other nonvenomous arthropods, initial encounter: Secondary | ICD-10-CM | POA: Insufficient documentation

## 2023-06-15 DIAGNOSIS — L03114 Cellulitis of left upper limb: Secondary | ICD-10-CM | POA: Insufficient documentation

## 2023-06-15 MED ORDER — DOXYCYCLINE HYCLATE 100 MG PO CAPS: 100.0000 mg | ORAL_CAPSULE | Freq: Two times a day (BID) | ORAL | 0 refills | Status: DC

## 2023-06-15 MED ORDER — DOXYCYCLINE HYCLATE 100 MG PO TABS
100.0000 mg | ORAL_TABLET | Freq: Once | ORAL | Status: AC
  Administered 2023-06-15: 100 mg via ORAL
  Filled 2023-06-15: qty 1

## 2023-06-15 MED ORDER — DOXYCYCLINE HYCLATE 100 MG PO TABS: 100.0000 mg | ORAL_TABLET | Freq: Once | ORAL | Status: DC

## 2023-06-15 NOTE — ED Provider Notes (Signed)
[place] EMERGENCY DEPARTMENT AT [facility] Provider Note   CSN: [id number] Arrival date & time: 06/15/23  4401     History  Chief Complaint  Patient presents with   Insect Bite    Trevor [name] is a 42 y.o. male with PMHx chronic pain, bipolar 1 disorder, GAD, HLD who presents to ED concerned with spider bite. Patient denies seeing the spider, but states that bite initially looked like a normal spider bite. Patient concerned because area is now red and mildly swollen. These symptoms have been progressing over the past 2 days.   Denies fever, nausea, vomiting, diarrhea.  HPI     Home Medications Prior to Admission medications   Medication Sig Start Date End Date Taking? Authorizing Provider  acetaminophen (TYLENOL) 325 MG tablet Take 2 tablets (650 mg total) by mouth every 6 (six) hours as needed for mild pain, moderate pain or fever. 10/02/19   Caccavale, Sophia, PA-C  atorvastatin (LIPITOR) 20 MG tablet Take 1 tablet (20 mg total) by mouth daily at 6 PM. 07/31/18   Rainville, Burlene Arnt, MD  doxepin (SINEQUAN) 75 MG capsule Take 150 mg by mouth at bedtime.    [provider]  doxepin (SINEQUAN) 75 MG capsule Take 1 capsule (75 mg total) by mouth at bedtime. 12/17/20   Marcellina Millin, MD  doxycycline (VIBRAMYCIN) 100 MG capsule Take 1 capsule (100 mg total) by mouth 2 (two) times daily for 7 days. 06/15/23 06/22/23 Yes Amyre Segundo, Charlotte Sanes F, PA-C  gabapentin (NEURONTIN) 400 MG capsule Take 1 capsule (400 mg total) by mouth 3 (three) times daily. 07/31/18   Micheal Likens, MD  hydrOXYzine (ATARAX/VISTARIL) 50 MG tablet Take 1 tablet (50 mg total) by mouth every 6 (six) hours as needed for anxiety. 07/31/18   Micheal Likens, MD  ibuprofen (ADVIL) 600 MG tablet Take 1 tablet (600 mg total) by mouth every 6 (six) hours as needed. 03/23/23   Horton, Clabe Seal, DO  Melatonin 5 MG TABS Take 1 tablet (5 mg total) by mouth daily at 8 pm.  07/31/18   Rainville, Burlene Arnt, MD  METHADONE HCL PO Take 115 mg by mouth daily at 6 (six) AM.     [provider]  naproxen (NAPROSYN) 500 MG tablet Take 1 tablet (500 mg total) by mouth 2 (two) times daily. 02/02/23   Horton, Kristie M, DO  nicotine (NICODERM CQ - DOSED IN MG/24 HOURS) 21 mg/24hr patch Place 1 patch (21 mg total) onto the skin daily. Patient not taking: Reported on 08/14/2018 07/31/18   Micheal Likens, MD  OLANZapine (ZYPREXA) 10 MG tablet Take 1 tablet (10 mg total) by mouth at bedtime. 07/31/18   Micheal Likens, MD  ondansetron (ZOFRAN) 4 MG tablet Take 1 tablet (4 mg total) by mouth every 6 (six) hours. 02/02/23   Horton, Danford Bad M, DO  QUEtiapine (SEROQUEL) 100 MG tablet Take 1 tablet (100 mg total) by mouth at bedtime. 12/17/20   Marcellina Millin, MD  sertraline (ZOLOFT) 100 MG tablet Take 1 tablet (100 mg total) by mouth daily. 08/01/18   Micheal Likens, MD  sertraline (ZOLOFT) 50 MG tablet Take 1 tablet (50 mg total) by mouth daily. 12/17/20   Marcellina Millin, MD  tamsulosin (FLOMAX) 0.4 MG CAPS capsule Take 1 capsule (0.4 mg total) by mouth daily after supper. 03/09/22   Khatri, Hina, PA-C      Allergies    Augmentin [amoxicillin-pot clavulanate]  Review of Systems   Review of Systems  Skin:  Positive for rash.    Physical Exam Updated Vital Signs BP 119/79 (BP Location: Right Arm)   Pulse 95   Temp 97.7 F (36.5 C) (Oral)   Resp 18   Ht 5\' 11"  (1.803 m)   Wt 79.4 kg   SpO2 98%   BMI 24.41 kg/m  Physical Exam Vitals and nursing note reviewed.  Constitutional:      General: He is not in acute distress.    Appearance: He is not ill-appearing or toxic-appearing.  HENT:     Head: Normocephalic and atraumatic.     Mouth/Throat:     Mouth: Mucous membranes are moist.  Eyes:     General: No scleral icterus.       Right eye: No discharge.        Left eye: No discharge.     Conjunctiva/sclera:  Conjunctivae normal.  Cardiovascular:     Rate and Rhythm: Normal rate and regular rhythm.     Pulses: Normal pulses.     Heart sounds: Normal heart sounds. No murmur heard. Pulmonary:     Effort: Pulmonary effort is normal. No respiratory distress.     Breath sounds: Normal breath sounds. No wheezing, rhonchi or rales.  Abdominal:     General: Abdomen is flat. Bowel sounds are normal.  Musculoskeletal:     Right lower leg: No edema.     Left lower leg: No edema.     Comments: Left wrist active ROM intact.  Skin:    General: Skin is warm and dry.     Comments: Spider bite 1cm proximal to left wrist with approx 2-3cm surrounding erythema and mild swelling. Mild increased warmth. No fluctuance or purulence appreciated. +2 radial pulse.  Neurological:     General: No focal deficit present.     Mental Status: He is alert. Mental status is at baseline.  Psychiatric:        Mood and Affect: Mood normal.     ED Results / Procedures / Treatments   Labs (all labs ordered are listed, but only abnormal results are displayed) Labs Reviewed - No data to display  EKG None  Radiology No results found.  Procedures Procedures    Medications Ordered in ED Medications  doxycycline (VIBRA-TABS) tablet 100 mg (has no administration in time range)    ED Course/ Medical Decision Making/ A&P                                 Medical Decision Making   This patient presents to the ED for concern of abscess, this involves an extensive number of treatment options, and is a complaint that carries with it a high risk of complications and morbidity.  The differential diagnosis includes cellulitis, abscess, sepsis, folliculitis, necrotizing fasciitis, impetigo   Co morbidities that complicate the patient evaluation  chronic pain, bipolar 1 disorder, GAD, HLD    Additional history obtained:  01/2023 BMP reviewed and unremarkable   Problem List / ED Course / Critical interventions /  Medication management  Patient presents to ED concerned for spider bite infection. Symptoms started 2 days ago and has been slowly progressing. Bite is proximal to left wrist with mild erythema and swelling.  Left wrist active range of motion within normal limits.  +2 radial pulse. No tenderness to palpation. No purulence or fluctuance appreciated. Patient denying any other infectious  symptoms. Patient afebrile with stable vitals. It appears that this spider bite infection should respond appropriately to outpatient ABX.  Provided patient with 1 dose of Doxy here in the emergency room which they tolerated well.  Sent the rest of the ABX course to pharmacy.  Educated patient to follow-up with primary care provider.   I have reviewed the patients home medicines and have made adjustments as needed Patient afebrile with stable vitals.  Provided return precautions.  Discharged in good condition.   Social Determinants of Health:  none         Final Clinical Impression(s) / ED Diagnoses Final diagnoses:  Cellulitis of left upper extremity    Rx / DC Orders ED Discharge Orders          Ordered    doxycycline (VIBRAMYCIN) 100 MG capsule  2 times daily        06/15/23 0725              Dorthy Cooler, PA-C 06/15/23 0729    Lonell Grandchild, MD 06/15/23 1217

## 2023-06-15 NOTE — ED Triage Notes (Signed)
Pt said that there is a spider bite on the left wrist that has made the wrist swell and turn red. Noticed it 2 days ago.

## 2023-06-15 NOTE — Discharge Instructions (Addendum)
It was a pleasure caring for you today.  Physical exam was concerning for cellulitis without complication.  You are provided 1 dose of antibiotics here in the emergency room.  I have sent the rest your course to a nearby [facility].  Seek emergency care if experiencing any new or worsening symptoms.

## 2023-06-17 ENCOUNTER — Emergency Department (HOSPITAL_COMMUNITY)
Admission: EM | Admit: 2023-06-17 | Discharge: 2023-06-17 | Disposition: A | Discharge status: Home / Self Care | Payer: BLUE CROSS/BLUE SHIELD | Attending: Emergency Medicine | Admitting: Emergency Medicine

## 2023-06-17 ENCOUNTER — Other Ambulatory Visit (HOSPITAL_COMMUNITY): Payer: Self-pay

## 2023-06-17 ENCOUNTER — Encounter (HOSPITAL_COMMUNITY): Payer: Self-pay

## 2023-06-17 ENCOUNTER — Other Ambulatory Visit: Payer: Self-pay

## 2023-06-17 DIAGNOSIS — L03114 Cellulitis of left upper limb: Secondary | ICD-10-CM | POA: Insufficient documentation

## 2023-06-17 DIAGNOSIS — R21 Rash and other nonspecific skin eruption: Secondary | ICD-10-CM | POA: Diagnosis present

## 2023-06-17 MED ORDER — DOXYCYCLINE HYCLATE 100 MG PO TABS
100.0000 mg | ORAL_TABLET | Freq: Two times a day (BID) | ORAL | 0 refills | Status: AC
  Filled 2023-06-17: qty 14, 7d supply, fill #0
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
## 2023-06-17 NOTE — ED Triage Notes (Signed)
Pt came in via POV d/t needing a different ABT that he can afford from a spider bite he reports happened 4 days ago. A/Ox4, rates pain 10/10 in that hand/wrist area.

## 2023-06-17 NOTE — Discharge Instructions (Signed)
Please take your antibiotics as prescribed.

## 2023-08-01 ENCOUNTER — Emergency Department (HOSPITAL_COMMUNITY)
Admission: EM | Admit: 2023-08-01 | Discharge: 2023-08-01 | Discharge status: Against Medical Advice | Payer: BLUE CROSS/BLUE SHIELD | Attending: Emergency Medicine | Admitting: Emergency Medicine

## 2023-08-01 DIAGNOSIS — W57XXXA Bitten or stung by nonvenomous insect and other nonvenomous arthropods, initial encounter: Secondary | ICD-10-CM | POA: Insufficient documentation

## 2023-08-01 DIAGNOSIS — Z5321 Procedure and treatment not carried out due to patient leaving prior to being seen by health care provider: Secondary | ICD-10-CM | POA: Insufficient documentation

## 2023-08-01 DIAGNOSIS — T148XXA Other injury of unspecified body region, initial encounter: Secondary | ICD-10-CM | POA: Insufficient documentation

## 2023-08-01 NOTE — ED Notes (Signed)
No answer for triage.

## 2023-08-02 ENCOUNTER — Other Ambulatory Visit: Payer: Self-pay

## 2023-08-02 ENCOUNTER — Emergency Department (HOSPITAL_COMMUNITY): Payer: BLUE CROSS/BLUE SHIELD

## 2023-08-02 ENCOUNTER — Encounter (HOSPITAL_COMMUNITY): Payer: Self-pay

## 2023-08-02 ENCOUNTER — Emergency Department (HOSPITAL_COMMUNITY)
Admission: EM | Admit: 2023-08-02 | Discharge: 2023-08-03 | Disposition: A | Discharge status: Home / Self Care | Payer: BLUE CROSS/BLUE SHIELD | Source: Home / Self Care | Attending: Emergency Medicine | Admitting: Emergency Medicine

## 2023-08-02 DIAGNOSIS — L02416 Cutaneous abscess of left lower limb: Secondary | ICD-10-CM | POA: Insufficient documentation

## 2023-08-02 DIAGNOSIS — D72829 Elevated white blood cell count, unspecified: Secondary | ICD-10-CM | POA: Insufficient documentation

## 2023-08-02 DIAGNOSIS — L0291 Cutaneous abscess, unspecified: Secondary | ICD-10-CM

## 2023-08-02 DIAGNOSIS — R7881 Bacteremia: Secondary | ICD-10-CM | POA: Diagnosis not present

## 2023-08-02 LAB — CBC WITH DIFFERENTIAL/PLATELET
Abs Immature Granulocytes: 0.03 10*3/uL (ref 0.00–0.07)
Basophils Absolute: 0.1 10*3/uL (ref 0.0–0.1)
Basophils Relative: 0 %
Eosinophils Absolute: 0.1 10*3/uL (ref 0.0–0.5)
Eosinophils Relative: 1 %
HCT: 38.7 % — ABNORMAL LOW (ref 39.0–52.0)
Hemoglobin: 12.7 g/dL — ABNORMAL LOW (ref 13.0–17.0)
Immature Granulocytes: 0 %
Lymphocytes Relative: 17 %
Lymphs Abs: 1.9 10*3/uL (ref 0.7–4.0)
MCH: 30 pg (ref 26.0–34.0)
MCHC: 32.8 g/dL (ref 30.0–36.0)
MCV: 91.3 fL (ref 80.0–100.0)
Monocytes Absolute: 0.7 10*3/uL (ref 0.1–1.0)
Monocytes Relative: 6 %
Neutro Abs: 8.7 10*3/uL — ABNORMAL HIGH (ref 1.7–7.7)
Neutrophils Relative %: 76 %
Platelets: 254 10*3/uL (ref 150–400)
RBC: 4.24 MIL/uL (ref 4.22–5.81)
RDW: 13.5 % (ref 11.5–15.5)
WBC: 11.6 10*3/uL — ABNORMAL HIGH (ref 4.0–10.5)
nRBC: 0 % (ref 0.0–0.2)

## 2023-08-02 LAB — COMPREHENSIVE METABOLIC PANEL
ALT: 22 U/L (ref 0–44)
AST: 26 U/L (ref 15–41)
Albumin: 3.8 g/dL (ref 3.5–5.0)
Alkaline Phosphatase: 73 U/L (ref 38–126)
Anion gap: 8 (ref 5–15)
BUN: 20 mg/dL (ref 6–20)
CO2: 25 mmol/L (ref 22–32)
Calcium: 8.9 mg/dL (ref 8.9–10.3)
Chloride: 105 mmol/L (ref 98–111)
Creatinine, Ser: 0.85 mg/dL (ref 0.61–1.24)
GFR, Estimated: >60 mL/min (ref >60)
Glucose, Bld: 107 mg/dL — ABNORMAL HIGH (ref 70–99)
Potassium: 3.9 mmol/L (ref 3.5–5.1)
Sodium: 138 mmol/L (ref 135–145)
Total Bilirubin: 0.3 mg/dL (ref <1.2)
Total Protein: 7 g/dL (ref 6.5–8.1)

## 2023-08-02 LAB — PROTIME-INR
INR: 1 (ref 0.8–1.2)
Prothrombin Time: 13.4 s (ref 11.4–15.2)

## 2023-08-02 LAB — I-STAT CG4 LACTIC ACID, ED: Lactic Acid, Venous: 1.5 mmol/L (ref 0.5–1.9)

## 2023-08-02 NOTE — ED Triage Notes (Signed)
Pt has an "insect bite" to his left knee. Redness, swelling, and pain. Pt states that "he knows what I'm thinking and that that's not it". Pt reports being an iv meth user.

## 2023-08-03 ENCOUNTER — Telehealth: Payer: Self-pay | Admitting: Surgery

## 2023-08-03 ENCOUNTER — Other Ambulatory Visit (HOSPITAL_COMMUNITY): Payer: Self-pay

## 2023-08-03 LAB — BLOOD CULTURE ID PANEL (REFLEXED) - BCID2

## 2023-08-03 LAB — URINALYSIS, W/ REFLEX TO CULTURE (INFECTION SUSPECTED)
Bacteria, UA: NONE SEEN
Bilirubin Urine: NEGATIVE
Glucose, UA: NEGATIVE mg/dL
Hgb urine dipstick: NEGATIVE
Ketones, ur: NEGATIVE mg/dL
Leukocytes,Ua: NEGATIVE
Nitrite: NEGATIVE
Protein, ur: NEGATIVE mg/dL
Specific Gravity, Urine: 1.031 — ABNORMAL HIGH (ref 1.005–1.030)
pH: 5 (ref 5.0–8.0)

## 2023-08-03 MED ORDER — DOXYCYCLINE HYCLATE 100 MG PO CAPS
100.0000 mg | ORAL_CAPSULE | Freq: Two times a day (BID) | ORAL | 0 refills | Status: DC
  Filled 2023-08-03: qty 20, 10d supply, fill #0

## 2023-08-03 MED ORDER — DOXYCYCLINE HYCLATE 100 MG PO TABS
100.0000 mg | ORAL_TABLET | Freq: Once | ORAL | Status: AC
  Administered 2023-08-03: 100 mg via ORAL
  Filled 2023-08-03: qty 1

## 2023-08-03 MED ORDER — IBUPROFEN 800 MG PO TABS
800.0000 mg | ORAL_TABLET | Freq: Once | ORAL | Status: AC
  Administered 2023-08-03: 800 mg via ORAL
  Filled 2023-08-03: qty 1

## 2023-08-03 MED ORDER — LIDOCAINE-EPINEPHRINE (PF) 2 %-1:200000 IJ SOLN
10.0000 mL | Freq: Once | INTRAMUSCULAR | Status: AC
  Administered 2023-08-03: 10 mL
  Filled 2023-08-03: qty 20

## 2023-08-03 NOTE — ED Provider Notes (Signed)
[place] EMERGENCY DEPARTMENT AT [facility] Provider Note   CSN: [id number] Arrival date & time: 08/02/23  2253     History  Chief Complaint  Patient presents with   Insect Bite    Trevor [name] is a 42 y.o. male.  Patient with past medical history significant for IV drug use, bipolar 1 disorder, chronic pain presents to the emergency department complaining of possible abscess to the medial portion of the left knee.  Patient states that for the past 3 days he noticed a lesion and believes it may have been a spider bite.  He did not see a spider or feel a bite.  He states there was some drainage 2 days ago but has continued to swell and there is now some surrounding erythema.  He denies systemic symptoms such as nausea, vomiting, abdominal pain, fever, chills.  HPI     Home Medications Prior to Admission medications   Medication Sig Start Date End Date Taking? Authorizing Provider  doxycycline (VIBRAMYCIN) 100 MG capsule Take 1 capsule (100 mg total) by mouth 2 (two) times daily. 08/03/23  Yes Darrick Grinder, PA-C  acetaminophen (TYLENOL) 325 MG tablet Take 2 tablets (650 mg total) by mouth every 6 (six) hours as needed for mild pain, moderate pain or fever. 10/02/19   Caccavale, Sophia, PA-C  atorvastatin (LIPITOR) 20 MG tablet Take 1 tablet (20 mg total) by mouth daily at 6 PM. 07/31/18   Rainville, Burlene Arnt, MD  doxepin (SINEQUAN) 75 MG capsule Take 150 mg by mouth at bedtime.    [provider]  doxepin (SINEQUAN) 75 MG capsule Take 1 capsule (75 mg total) by mouth at bedtime. 12/17/20   Marcellina Millin, MD  gabapentin (NEURONTIN) 400 MG capsule Take 1 capsule (400 mg total) by mouth 3 (three) times daily. 07/31/18   Micheal Likens, MD  hydrOXYzine (ATARAX/VISTARIL) 50 MG tablet Take 1 tablet (50 mg total) by mouth every 6 (six) hours as needed for anxiety. 07/31/18   Micheal Likens, MD  ibuprofen (ADVIL) 600 MG tablet Take  1 tablet (600 mg total) by mouth every 6 (six) hours as needed. 03/23/23   Horton, Clabe Seal, DO  Melatonin 5 MG TABS Take 1 tablet (5 mg total) by mouth daily at 8 pm. 07/31/18   Rainville, Burlene Arnt, MD  METHADONE HCL PO Take 115 mg by mouth daily at 6 (six) AM.     [provider]  naproxen (NAPROSYN) 500 MG tablet Take 1 tablet (500 mg total) by mouth 2 (two) times daily. 02/02/23   Horton, Kristie M, DO  nicotine (NICODERM CQ - DOSED IN MG/24 HOURS) 21 mg/24hr patch Place 1 patch (21 mg total) onto the skin daily. Patient not taking: Reported on 08/14/2018 07/31/18   Micheal Likens, MD  OLANZapine (ZYPREXA) 10 MG tablet Take 1 tablet (10 mg total) by mouth at bedtime. 07/31/18   Micheal Likens, MD  ondansetron (ZOFRAN) 4 MG tablet Take 1 tablet (4 mg total) by mouth every 6 (six) hours. 02/02/23   Horton, Danford Bad M, DO  QUEtiapine (SEROQUEL) 100 MG tablet Take 1 tablet (100 mg total) by mouth at bedtime. 12/17/20   Marcellina Millin, MD  sertraline (ZOLOFT) 100 MG tablet Take 1 tablet (100 mg total) by mouth daily. 08/01/18   Micheal Likens, MD  sertraline (ZOLOFT) 50 MG tablet Take 1 tablet (50 mg total) by mouth daily. 12/17/20   Marcellina Millin, MD  tamsulosin (FLOMAX) 0.4 MG CAPS capsule Take 1 capsule (0.4 mg total) by mouth daily after supper. 03/09/22   Khatri, Hillary Bow, PA-C      Allergies    Augmentin [amoxicillin-pot clavulanate]    Review of Systems   Review of Systems  Physical Exam Updated Vital Signs BP 111/70 (BP Location: Right Arm)   Pulse 84   Temp 97.8 F (36.6 C) (Oral)   Resp 16   Ht 5\' 11"  (1.803 m)   Wt 74.8 kg   SpO2 100%   BMI 23.00 kg/m  Physical Exam Vitals and nursing note reviewed.  HENT:     Head: Normocephalic and atraumatic.  Eyes:     Pupils: Pupils are equal, round, and reactive to light.  Pulmonary:     Effort: Pulmonary effort is normal. No respiratory distress.  Musculoskeletal:         General: No signs of injury. Normal range of motion.     Cervical back: Normal range of motion.     Comments: Normal range of motion of the left knee.  No pain with range of motion.  Skin:    General: Skin is dry.     Findings: Lesion present.     Comments: Fluctuant indurated area in the superficial region of the medial left knee.  No active drainage.  Surrounding erythema.  Neurological:     Mental Status: He is alert.  Psychiatric:        Speech: Speech normal.        Behavior: Behavior normal.     ED Results / Procedures / Treatments   Labs (all labs ordered are listed, but only abnormal results are displayed) Labs Reviewed  COMPREHENSIVE METABOLIC PANEL - Abnormal; Notable for the following components:      Result Value   Glucose, Bld 107 (*)    All other components within normal limits  CBC WITH DIFFERENTIAL/PLATELET - Abnormal; Notable for the following components:   WBC 11.6 (*)    Hemoglobin 12.7 (*)    HCT 38.7 (*)    Neutro Abs 8.7 (*)    All other components within normal limits  URINALYSIS, W/ REFLEX TO CULTURE (INFECTION SUSPECTED) - Abnormal; Notable for the following components:   Specific Gravity, Urine 1.031 (*)    All other components within normal limits  CULTURE, BLOOD (ROUTINE X 2)  CULTURE, BLOOD (ROUTINE X 2)  PROTIME-INR  I-STAT CG4 LACTIC ACID, ED    EKG None  Radiology DG Chest 2 View  Result Date: 08/03/2023 CLINICAL DATA:  Infected insect bite. EXAM: CHEST - 2 VIEW COMPARISON:  August 14, 2018 FINDINGS: The heart size and mediastinal contours are within normal limits. Both lungs are clear. The visualized skeletal structures are unremarkable. IMPRESSION: No active cardiopulmonary disease. Electronically Signed   By: Aram Candela M.D.   On: 08/03/2023 02:00    Procedures .Marland KitchenIncision and Drainage  Date/Time: 08/03/2023 2:45 AM  Performed by: Darrick Grinder, PA-C Authorized by: Darrick Grinder, PA-C   Consent:    Consent  obtained:  Verbal   Consent given by:  Patient   Risks, benefits, and alternatives were discussed: yes     Risks discussed:  Bleeding, incomplete drainage, pain, damage to other organs and infection Universal protocol:    Procedure explained and questions answered to patient or proxy's satisfaction: yes     Patient identity confirmed:  Verbally with patient Location:    Type:  Abscess   Location:  Lower extremity  Lower extremity location:  Knee   Knee location:  L knee Pre-procedure details:    Skin preparation:  Povidone-iodine Sedation:    Sedation type:  None Anesthesia:    Anesthesia method:  Local infiltration   Local anesthetic:  Lidocaine 2% WITH epi Procedure type:    Complexity:  Complex Procedure details:    Incision types:  Single straight   Wound management:  Probed and deloculated   Drainage:  Purulent   Drainage amount:  Copious   Wound treatment:  Wound left open   Packing materials:  None Post-procedure details:    Procedure completion:  Procedure terminated at patient's request     Medications Ordered in ED Medications  lidocaine-EPINEPHrine (XYLOCAINE W/EPI) 2 %-1:200000 (PF) injection 10 mL (10 mLs Infiltration Given 08/03/23 0247)  ibuprofen (ADVIL) tablet 800 mg (800 mg Oral Given 08/03/23 0246)  doxycycline (VIBRA-TABS) tablet 100 mg (100 mg Oral Given 08/03/23 0246)    ED Course/ Medical Decision Making/ A&P                                 Medical Decision Making Amount and/or Complexity of Data Reviewed Labs: ordered. Radiology: ordered.  Risk Prescription drug management.   This patient presents to the ED for concern of possible abscess, this involves an extensive number of treatment options, and is a complaint that carries with it a high risk of complications and morbidity.  The differential diagnosis includes abscess, cellulitis, others   Co morbidities that complicate the patient evaluation  History of substance abuse   Lab  Tests:  I Ordered, and personally interpreted labs.  The pertinent results include: WBC 11.6   Imaging Studies ordered:  I ordered imaging studies including chest x-ray I independently visualized and interpreted imaging which showed no acute findings I agree with the radiologist interpretation   Problem List / ED Course / Critical interventions / Medication management   I ordered medication including ibuprofen for pain, doxycycline for antibiotic coverage Reevaluation of the patient after these medicines showed that the patient improved I have reviewed the patients home medicines and have made adjustments as needed   Social Determinants of Health:  Patient with history of drug abuse, tobacco use   Test / Admission - Considered:  Patient with what appears to be an abscess of the left medial knee region.  Incision and drainage was performed with copious amounts of purulent discharge.  Patient terminated the procedure due to pain prior to complete cleanout.  Plan to discharge patient on course of doxycycline for antibiotic coverage with area of surrounding erythema concerning for cellulitis.  [facility] to [facility] consult for help with medication affordability.  Joint moves freely with no pain, no concern at this time for septic joint.  Return precautions provided.         Final Clinical Impression(s) / ED Diagnoses Final diagnoses:  Abscess    Rx / DC Orders ED Discharge Orders          Ordered    doxycycline (VIBRAMYCIN) 100 MG capsule  2 times daily        08/03/23 0311              Pamala Duffel 08/03/23 8119    Nira Conn, MD 08/03/23 [phone]

## 2023-08-03 NOTE — Discharge Instructions (Addendum)
Please take the prescribed medication as directed for antibiotic coverage.  Please complete the entire course of antibiotics.  If you see signs of worsening infection or life-threatening symptoms return to the emergency department.

## 2023-08-03 NOTE — ED Notes (Signed)
Attempted to call the patient er the request of Dr Theresia Lo due to positive blood cultures. Patient did not answer. Voicemail not available. No message left.

## 2023-08-03 NOTE — Telephone Encounter (Signed)
[facility] ED staff attempted to get in touch  with patient due to positive culture result, no answer at either numbers listed.  A wellness check was initiated. [employer] non emergency police contacted awaiting return call.  Updated RN/ EDP.

## 2023-08-04 ENCOUNTER — Telehealth (HOSPITAL_COMMUNITY): Payer: Self-pay

## 2023-08-04 NOTE — Telephone Encounter (Signed)
Contacted the patient in regards to MSSA in 11/12 blood cultures (only 1 bottle collected) and asked to return to the hospital for further evaluation and work-up. Noted presentation to the ED at that time time was related to L-knee abscess. This underwent I&D in the ED and the patient was given Doxycycline at discharge.   The patient was agreeable to return back to the ED and states he will return this evening. Will need repeat blood cultures and work-up for disseminated sites of infection.  Thank you for allowing pharmacy to be a part of this patient's care.  Georgina Pillion, PharmD, BCPS, BCIDP Infectious Diseases Clinical Pharmacist 08/04/2023 2:45 PM   **Pharmacist phone directory can now be found on [email or web address] ([email or web address]).  Listed under [facility].

## 2023-08-05 ENCOUNTER — Emergency Department (HOSPITAL_COMMUNITY): Payer: BLUE CROSS/BLUE SHIELD

## 2023-08-05 ENCOUNTER — Encounter (HOSPITAL_COMMUNITY): Payer: Self-pay | Admitting: Emergency Medicine

## 2023-08-05 ENCOUNTER — Inpatient Hospital Stay (HOSPITAL_COMMUNITY)
Admission: EM | Admit: 2023-08-05 | Discharge: 2023-08-05 | DRG: 872 | Discharge status: Against Medical Advice | Payer: BLUE CROSS/BLUE SHIELD | Attending: Internal Medicine | Admitting: Internal Medicine

## 2023-08-05 ENCOUNTER — Other Ambulatory Visit: Payer: Self-pay

## 2023-08-05 DIAGNOSIS — F3130 Bipolar disorder, current episode depressed, mild or moderate severity, unspecified: Secondary | ICD-10-CM | POA: Diagnosis present

## 2023-08-05 DIAGNOSIS — B9561 Methicillin susceptible Staphylococcus aureus infection as the cause of diseases classified elsewhere: Secondary | ICD-10-CM | POA: Diagnosis present

## 2023-08-05 DIAGNOSIS — R739 Hyperglycemia, unspecified: Secondary | ICD-10-CM | POA: Diagnosis present

## 2023-08-05 DIAGNOSIS — F411 Generalized anxiety disorder: Secondary | ICD-10-CM | POA: Diagnosis present

## 2023-08-05 DIAGNOSIS — F315 Bipolar disorder, current episode depressed, severe, with psychotic features: Secondary | ICD-10-CM | POA: Diagnosis present

## 2023-08-05 DIAGNOSIS — L02415 Cutaneous abscess of right lower limb: Secondary | ICD-10-CM | POA: Diagnosis present

## 2023-08-05 DIAGNOSIS — E785 Hyperlipidemia, unspecified: Secondary | ICD-10-CM | POA: Diagnosis present

## 2023-08-05 DIAGNOSIS — Z8249 Family history of ischemic heart disease and other diseases of the circulatory system: Secondary | ICD-10-CM

## 2023-08-05 DIAGNOSIS — Z83438 Family history of other disorder of lipoprotein metabolism and other lipidemia: Secondary | ICD-10-CM

## 2023-08-05 DIAGNOSIS — Z79890 Hormone replacement therapy (postmenopausal): Secondary | ICD-10-CM

## 2023-08-05 DIAGNOSIS — F1190 Opioid use, unspecified, uncomplicated: Secondary | ICD-10-CM | POA: Diagnosis present

## 2023-08-05 DIAGNOSIS — Z880 Allergy status to penicillin: Secondary | ICD-10-CM

## 2023-08-05 DIAGNOSIS — F17210 Nicotine dependence, cigarettes, uncomplicated: Secondary | ICD-10-CM | POA: Diagnosis present

## 2023-08-05 DIAGNOSIS — Z5329 Procedure and treatment not carried out because of patient's decision for other reasons: Secondary | ICD-10-CM | POA: Diagnosis present

## 2023-08-05 DIAGNOSIS — Z720 Tobacco use: Secondary | ICD-10-CM | POA: Diagnosis present

## 2023-08-05 DIAGNOSIS — Z79899 Other long term (current) drug therapy: Secondary | ICD-10-CM

## 2023-08-05 DIAGNOSIS — R000 Tachycardia, unspecified: Secondary | ICD-10-CM | POA: Diagnosis present

## 2023-08-05 DIAGNOSIS — F1910 Other psychoactive substance abuse, uncomplicated: Secondary | ICD-10-CM | POA: Diagnosis present

## 2023-08-05 DIAGNOSIS — R7881 Bacteremia: Principal | ICD-10-CM | POA: Diagnosis present

## 2023-08-05 DIAGNOSIS — R400 Somnolence: Secondary | ICD-10-CM | POA: Diagnosis present

## 2023-08-05 DIAGNOSIS — D649 Anemia, unspecified: Secondary | ICD-10-CM | POA: Diagnosis present

## 2023-08-05 DIAGNOSIS — G8929 Other chronic pain: Secondary | ICD-10-CM | POA: Diagnosis present

## 2023-08-05 DIAGNOSIS — Z981 Arthrodesis status: Secondary | ICD-10-CM

## 2023-08-05 LAB — COMPREHENSIVE METABOLIC PANEL
ALT: 23 U/L (ref 0–44)
AST: 22 U/L (ref 15–41)
Albumin: 3.5 g/dL (ref 3.5–5.0)
Alkaline Phosphatase: 69 U/L (ref 38–126)
Anion gap: 6 (ref 5–15)
BUN: 19 mg/dL (ref 6–20)
CO2: 27 mmol/L (ref 22–32)
Calcium: 8.6 mg/dL — ABNORMAL LOW (ref 8.9–10.3)
Chloride: 102 mmol/L (ref 98–111)
Creatinine, Ser: 0.83 mg/dL (ref 0.61–1.24)
GFR, Estimated: >60 mL/min (ref >60)
Glucose, Bld: 126 mg/dL — ABNORMAL HIGH (ref 70–99)
Potassium: 3.5 mmol/L (ref 3.5–5.1)
Sodium: 135 mmol/L (ref 135–145)
Total Bilirubin: 0.3 mg/dL (ref <1.2)
Total Protein: 6.8 g/dL (ref 6.5–8.1)

## 2023-08-05 LAB — APTT: aPTT: 30 s (ref 24–36)

## 2023-08-05 LAB — PROTIME-INR
INR: 1.1 (ref 0.8–1.2)
Prothrombin Time: 13.9 s (ref 11.4–15.2)

## 2023-08-05 LAB — CULTURE, BLOOD (ROUTINE X 2)

## 2023-08-05 LAB — CBC WITH DIFFERENTIAL/PLATELET
Abs Immature Granulocytes: 0.01 10*3/uL (ref 0.00–0.07)
Basophils Absolute: 0 10*3/uL (ref 0.0–0.1)
Basophils Relative: 1 %
Eosinophils Absolute: 0 10*3/uL (ref 0.0–0.5)
Eosinophils Relative: 1 %
HCT: 36.5 % — ABNORMAL LOW (ref 39.0–52.0)
Hemoglobin: 12.1 g/dL — ABNORMAL LOW (ref 13.0–17.0)
Immature Granulocytes: 0 %
Lymphocytes Relative: 26 %
Lymphs Abs: 1.5 10*3/uL (ref 0.7–4.0)
MCH: 30.1 pg (ref 26.0–34.0)
MCHC: 33.2 g/dL (ref 30.0–36.0)
MCV: 90.8 fL (ref 80.0–100.0)
Monocytes Absolute: 0.3 10*3/uL (ref 0.1–1.0)
Monocytes Relative: 6 %
Neutro Abs: 3.8 10*3/uL (ref 1.7–7.7)
Neutrophils Relative %: 66 %
Platelets: 272 10*3/uL (ref 150–400)
RBC: 4.02 MIL/uL — ABNORMAL LOW (ref 4.22–5.81)
RDW: 13.2 % (ref 11.5–15.5)
WBC: 5.7 10*3/uL (ref 4.0–10.5)
nRBC: 0 % (ref 0.0–0.2)

## 2023-08-05 LAB — RESP PANEL BY RT-PCR (RSV, FLU A&B, COVID)  RVPGX2
Influenza A by PCR: NEGATIVE
Influenza B by PCR: NEGATIVE
Resp Syncytial Virus by PCR: NEGATIVE
SARS Coronavirus 2 by RT PCR: NEGATIVE

## 2023-08-05 LAB — I-STAT CG4 LACTIC ACID, ED
Lactic Acid, Venous: 1.7 mmol/L (ref 0.5–1.9)
Lactic Acid, Venous: 0.3 mmol/L — ABNORMAL LOW (ref 0.5–1.9)

## 2023-08-05 MED ORDER — ENSURE ENLIVE PO LIQD: 237.0000 mL | Freq: Two times a day (BID) | ORAL | Status: DC

## 2023-08-05 MED ORDER — ACETAMINOPHEN 650 MG RE SUPP: 650.0000 mg | Freq: Four times a day (QID) | RECTAL | Status: DC | PRN

## 2023-08-05 MED ORDER — ENOXAPARIN SODIUM 40 MG/0.4ML IJ SOSY
40.0000 mg | PREFILLED_SYRINGE | INTRAMUSCULAR | Status: DC
  Filled 2023-08-05: qty 0.4

## 2023-08-05 MED ORDER — CEFAZOLIN SODIUM-DEXTROSE 2-4 GM/100ML-% IV SOLN
2.0000 g | Freq: Three times a day (TID) | INTRAVENOUS | Status: DC
  Administered 2023-08-05: 2 g via INTRAVENOUS
  Filled 2023-08-05 (×2): qty 100

## 2023-08-05 MED ORDER — VANCOMYCIN HCL IN DEXTROSE 1-5 GM/200ML-% IV SOLN: 1000.0000 mg | Freq: Once | INTRAVENOUS | Status: DC

## 2023-08-05 MED ORDER — NICOTINE 21 MG/24HR TD PT24: 21.0000 mg | MEDICATED_PATCH | Freq: Every day | TRANSDERMAL | Status: DC | PRN

## 2023-08-05 MED ORDER — ONDANSETRON HCL 4 MG PO TABS: 4.0000 mg | ORAL_TABLET | Freq: Four times a day (QID) | ORAL | Status: DC | PRN

## 2023-08-05 MED ORDER — MELATONIN 5 MG PO TABS: 10.0000 mg | ORAL_TABLET | Freq: Every evening | ORAL | Status: DC | PRN

## 2023-08-05 MED ORDER — ACETAMINOPHEN 325 MG PO TABS: 650.0000 mg | ORAL_TABLET | Freq: Four times a day (QID) | ORAL | Status: DC | PRN

## 2023-08-05 MED ORDER — NICOTINE POLACRILEX 2 MG MT LOZG
2.0000 mg | LOZENGE | OROMUCOSAL | Status: DC | PRN
  Filled 2023-08-05: qty 1

## 2023-08-05 MED ORDER — NALOXONE HCL 0.4 MG/ML IJ SOLN
0.4000 mg | INTRAMUSCULAR | Status: DC | PRN
  Administered 2023-08-05: 0.4 mg via INTRAVENOUS
  Filled 2023-08-05: qty 1

## 2023-08-05 MED ORDER — LACTATED RINGERS IV SOLN: INTRAVENOUS | Status: DC

## 2023-08-05 MED ORDER — ONDANSETRON HCL 4 MG/2ML IJ SOLN: 4.0000 mg | Freq: Four times a day (QID) | INTRAMUSCULAR | Status: DC | PRN

## 2023-08-05 MED ORDER — VANCOMYCIN HCL 1750 MG/350ML IV SOLN
1750.0000 mg | Freq: Once | INTRAVENOUS | Status: DC
Start: 2023-08-05 — End: 2023-08-05
  Filled 2023-08-05: qty 350

## 2023-08-05 NOTE — ED Provider Notes (Signed)
EMERGENCY DEPARTMENT AT [facility] Provider Note   CSN: [id number] Arrival date & time: 08/05/23  4098     History  Chief Complaint  Patient presents with   Abnormal Lab    Trevor [name] is a 42 y.o. male.  42 year old male with past medical history of IV drug abuse and hyperlipidemia presenting to the emergency department today with concern for positive blood cultures.  Patient was seen here few days ago and had an abscess.  This was drained.  He had blood cultures drawn during this visit.  These were positive.  He was subsequently sent back to the ER for further evaluation.  The patient reports that he has been having a cough and does have some erythema around where the abscess was.  He denies any other infectious symptoms at this time.  The abscess was over the medial aspect of the left knee.  The patient reports he has normal range of motion and has been able to ambulate on this.   Abnormal Lab      Home Medications Prior to Admission medications   Medication Sig Start Date End Date Taking? Authorizing Provider  acetaminophen (TYLENOL) 325 MG tablet Take 2 tablets (650 mg total) by mouth every 6 (six) hours as needed for mild pain, moderate pain or fever. 10/02/19   Caccavale, Sophia, PA-C  atorvastatin (LIPITOR) 20 MG tablet Take 1 tablet (20 mg total) by mouth daily at 6 PM. 07/31/18   Rainville, Burlene Arnt, MD  doxepin (SINEQUAN) 75 MG capsule Take 150 mg by mouth at bedtime.    [provider]  doxepin (SINEQUAN) 75 MG capsule Take 1 capsule (75 mg total) by mouth at bedtime. 12/17/20   Marcellina Millin, MD  doxycycline (VIBRAMYCIN) 100 MG capsule Take 1 capsule (100 mg total) by mouth 2 (two) times daily. 08/03/23   Darrick Grinder, PA-C  gabapentin (NEURONTIN) 400 MG capsule Take 1 capsule (400 mg total) by mouth 3 (three) times daily. 07/31/18   Micheal Likens, MD  hydrOXYzine (ATARAX/VISTARIL) 50 MG tablet Take 1  tablet (50 mg total) by mouth every 6 (six) hours as needed for anxiety. 07/31/18   Micheal Likens, MD  ibuprofen (ADVIL) 600 MG tablet Take 1 tablet (600 mg total) by mouth every 6 (six) hours as needed. 03/23/23   Horton, Clabe Seal, DO  Melatonin 5 MG TABS Take 1 tablet (5 mg total) by mouth daily at 8 pm. 07/31/18   Rainville, Burlene Arnt, MD  METHADONE HCL PO Take 115 mg by mouth daily at 6 (six) AM.     [provider]  naproxen (NAPROSYN) 500 MG tablet Take 1 tablet (500 mg total) by mouth 2 (two) times daily. 02/02/23   Horton, Kristie M, DO  nicotine (NICODERM CQ - DOSED IN MG/24 HOURS) 21 mg/24hr patch Place 1 patch (21 mg total) onto the skin daily. Patient not taking: Reported on 08/14/2018 07/31/18   Micheal Likens, MD  OLANZapine (ZYPREXA) 10 MG tablet Take 1 tablet (10 mg total) by mouth at bedtime. 07/31/18   Micheal Likens, MD  ondansetron (ZOFRAN) 4 MG tablet Take 1 tablet (4 mg total) by mouth every 6 (six) hours. 02/02/23   Horton, Danford Bad M, DO  QUEtiapine (SEROQUEL) 100 MG tablet Take 1 tablet (100 mg total) by mouth at bedtime. 12/17/20   Marcellina Millin, MD  sertraline (ZOLOFT) 100 MG tablet Take 1 tablet (100 mg total) by mouth daily. 08/01/18  Micheal Likens, MD  sertraline (ZOLOFT) 50 MG tablet Take 1 tablet (50 mg total) by mouth daily. 12/17/20   Marcellina Millin, MD  tamsulosin (FLOMAX) 0.4 MG CAPS capsule Take 1 capsule (0.4 mg total) by mouth daily after supper. 03/09/22   Khatri, Hina, PA-C      Allergies    Augmentin [amoxicillin-pot clavulanate]    Review of Systems   Review of Systems  Constitutional:  Positive for chills.  Skin:        Abscess  All other systems reviewed and are negative.   Physical Exam Updated Vital Signs BP 136/66   Pulse 100   Temp 98.9 F (37.2 C) (Oral)   Resp 16   Ht 5\' 11"  (1.803 m)   Wt 74.8 kg   SpO2 98%   BMI 23.00 kg/m  Physical Exam Vitals and nursing note  reviewed.   Gen: NAD Eyes: PERRL, EOMI HEENT: no oropharyngeal swelling Neck: trachea midline Resp: clear to auscultation bilaterally Card: Tachycardic, no murmurs, rubs, or gallops Abd: nontender, nondistended Extremities: no calf tenderness, no edema, the patient has normal range of motion of the left knee and is ambulating on this Vascular: 2+ radial pulses bilaterally, 2+ DP pulses bilaterally Skin: Multiple track marks over the upper arms noted, the patient does have an indurated area over the medial aspect of his left knee with no fluctuance Psyc: acting appropriately   ED Results / Procedures / Treatments   Labs (all labs ordered are listed, but only abnormal results are displayed) Labs Reviewed  COMPREHENSIVE METABOLIC PANEL - Abnormal; Notable for the following components:      Result Value   Glucose, Bld 126 (*)    Calcium 8.6 (*)    All other components within normal limits  CBC WITH DIFFERENTIAL/PLATELET - Abnormal; Notable for the following components:   RBC 4.02 (*)    Hemoglobin 12.1 (*)    HCT 36.5 (*)    All other components within normal limits  RESP PANEL BY RT-PCR (RSV, FLU A&B, COVID)  [id number]  CULTURE, BLOOD (ROUTINE X 2)  CULTURE, BLOOD (ROUTINE X 2)  PROTIME-INR  APTT  I-STAT CG4 LACTIC ACID, ED  I-STAT CG4 LACTIC ACID, ED    EKG None  Radiology DG Chest Port 1 View  Result Date: 08/05/2023 CLINICAL DATA:  Questionable sepsis, evaluate for abnormality peer EXAM: PORTABLE CHEST 1 VIEW COMPARISON:  08/02/2023 FINDINGS: Cardiac and mediastinal contours are within normal limits. No focal pulmonary opacity. No pleural effusion or pneumothorax. No acute osseous abnormality. IMPRESSION: No acute cardiopulmonary process. Electronically Signed   By: Wiliam Ke M.D.   On: 08/05/2023 12:12    Procedures Procedures    Medications Ordered in ED Medications  lactated ringers infusion ( Intravenous New Bag/Given 08/05/23 1034)  ceFAZolin (ANCEF) IVPB  2g/100 mL premix (0 g Intravenous Stopped 08/05/23 1117)    ED Course/ Medical Decision Making/ A&P                                 Medical Decision Making 42 year old male with past medical history of hyperlipidemia and IV drug abuse presenting to the emergency department today with concern for positive blood cultures.  Patient is tachycardic here on arrival.  I will initiate a sepsis workup on the patient.  Will cover him empirically with vancomycin to start.  I have called our clinical pharmacist to discuss whether or not this should  be sufficient given the culture results as the patient does report on sounds like an anaphylactic reaction to Augmentin.  Will broaden coverage as recommended after discussion with pharmacist.  He will require admission.  The pharmacy team spoke with infectious disease and cefazolin as recommended after they reviewed the patient's chart as well as allergies.  This is ordered.  They did recommend against the vancomycin based on his initial cultures.  The patient's initial labs are reassuring.  Calls placed hospital service for admission.  Amount and/or Complexity of Data Reviewed Labs: ordered. Radiology: ordered. ECG/medicine tests: ordered.  Risk Prescription drug management. Decision regarding hospitalization.           Final Clinical Impression(s) / ED Diagnoses Final diagnoses:  Bacteremia    Rx / DC Orders ED Discharge Orders     None         Durwin Glaze, MD 08/05/23 1231

## 2023-08-05 NOTE — Progress Notes (Signed)
Rapid response called tonight d/t patient being very difficult to arouse, unresponsive to verbal stimuli. Patient was A&Ox4 just after 7pm shift change, up ambulating around the room. Around 2200, patient is very drowsy, barely responds to aggressive sternal rub. RR 8, other vitals WNL. Patient has not received any ordered medications from [facility] that could contribute to current presentation, this nurse suspects patient may have taken something that he brought in to the hospital. Paged Hospitalist to notify and request order for Narcan. Called Rapid Response to bedside for further assessment. AC notified and came to bedside to speak with patient. This nurse asked patient if he had taken anything, patient became agitated and responded that we were only accusing him of using drugs because he has a history of substance abuse. Patient declined taking anything, but later reported to Hospitalist that he did take half of a Xanax that he had brought in from home. Patient refused to have his belongings searched. Patient was educated about how unsafe it is to take unprescribed medications while in inpatient setting, patient continues to deny taking anything. Patient spoke with Hospitalist, decided to leave AMA. Patient signed AMA form at 2245 this evening, was escorted out of hospital via wheelchair by transport.

## 2023-08-05 NOTE — Progress Notes (Signed)
A consult was received from an ED physician for vancomycin per pharmacy dosing.  The patient's profile has been reviewed for ht/wt/allergies/indication/available labs.    I spoke with ID pharmacist who recommends Ancef for MSSA bacteremia from 08/02/2023 blood culture results.  Contacted ED physician who agreed to discontinuing vancomycin and ordering Ancef 2 g IV q8h.                  Thank you, Lynden Ang, PharmD, BCPS 08/05/2023  10:05 AM
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
## 2023-08-05 NOTE — ED Triage Notes (Signed)
Patient arrives ambulatory by POV states police did a wellness visit since he did not answer his phone however was informed that he needed to return due to positive blood cultures. C/o pain to left knee where he was seen on 12th for. States he never picked up his antibiotics as well.

## 2023-08-05 NOTE — Sepsis Progress Note (Signed)
Elink will follow per sepsis protocol  

## 2023-08-05 NOTE — Plan of Care (Signed)

## 2023-08-05 NOTE — Progress Notes (Signed)
      2215- RN requesting bedside assessment. Patient is unconscious. He was A/O x4 a couple minutes ago per Charity fundraiser.  VSS, RR~10, pupils constricted. He is mildly responsive to deep sternum rub. Has hx of substance/ narcotic abuse. No tox screen on file. Narcan has been ordered. Urine drug screen ordered as well.    2220- Patient is A/O x4 after narcan dose. He is irritable, but compliant.  He admits to taking "a Xanax from the streets" prior to coming to ED. Also mentions he takes meth and marijuana often.  Denies use of fentanyl, heroin, cocaine use. Claims to be a "responsible drug addict" and denies having any illicit substance in his belongings. He denies any other drug use while in the hospital and is refusing a voluntary search of his belongings. Patient is frustrated and is now wanting to leave AMA. I have explained multiple times that his bacteremia requires IV antibiotics and hospital stay, but patient is unwilling to stay.     Against Medical Advice Trevor [name] expresses desire to leave the Hospital immediately. Patient has been warned that this is not medically advisable at this time, and can result in medical complications like Death and Disability. Patient understands and accepts the risks involved and assumes full responsibilty of this decision.  This patient has also been advised that if they feel the need for further medical assistance to return to any available ER or dial 9-1-1.  I informed Nursing staff that patient needs to signed Against Medical Advice form. Form was signed at 2245.    Anthoney Harada, DNP, ACNPC- AG [facility]

## 2023-08-06 ENCOUNTER — Other Ambulatory Visit: Payer: Self-pay

## 2023-08-06 ENCOUNTER — Telehealth (HOSPITAL_BASED_OUTPATIENT_CLINIC_OR_DEPARTMENT_OTHER): Payer: Self-pay | Admitting: *Deleted

## 2023-08-06 ENCOUNTER — Inpatient Hospital Stay (HOSPITAL_COMMUNITY)
Admission: EM | Admit: 2023-08-06 | Discharge: 2023-08-18 | DRG: 603 | Disposition: A | Discharge status: Home / Self Care | Payer: BLUE CROSS/BLUE SHIELD | Attending: Internal Medicine | Admitting: Internal Medicine

## 2023-08-06 ENCOUNTER — Encounter (HOSPITAL_COMMUNITY): Payer: Self-pay | Admitting: Emergency Medicine

## 2023-08-06 DIAGNOSIS — F411 Generalized anxiety disorder: Secondary | ICD-10-CM | POA: Diagnosis present

## 2023-08-06 DIAGNOSIS — L03116 Cellulitis of left lower limb: Secondary | ICD-10-CM | POA: Diagnosis present

## 2023-08-06 DIAGNOSIS — E785 Hyperlipidemia, unspecified: Secondary | ICD-10-CM | POA: Diagnosis present

## 2023-08-06 DIAGNOSIS — Z8249 Family history of ischemic heart disease and other diseases of the circulatory system: Secondary | ICD-10-CM

## 2023-08-06 DIAGNOSIS — F1110 Opioid abuse, uncomplicated: Secondary | ICD-10-CM | POA: Diagnosis present

## 2023-08-06 DIAGNOSIS — M5134 Other intervertebral disc degeneration, thoracic region: Secondary | ICD-10-CM | POA: Diagnosis present

## 2023-08-06 DIAGNOSIS — K029 Dental caries, unspecified: Secondary | ICD-10-CM | POA: Diagnosis present

## 2023-08-06 DIAGNOSIS — F315 Bipolar disorder, current episode depressed, severe, with psychotic features: Secondary | ICD-10-CM | POA: Diagnosis present

## 2023-08-06 DIAGNOSIS — Z79899 Other long term (current) drug therapy: Secondary | ICD-10-CM

## 2023-08-06 DIAGNOSIS — E875 Hyperkalemia: Secondary | ICD-10-CM | POA: Diagnosis not present

## 2023-08-06 DIAGNOSIS — B9561 Methicillin susceptible Staphylococcus aureus infection as the cause of diseases classified elsewhere: Secondary | ICD-10-CM | POA: Diagnosis present

## 2023-08-06 DIAGNOSIS — Z880 Allergy status to penicillin: Secondary | ICD-10-CM

## 2023-08-06 DIAGNOSIS — G894 Chronic pain syndrome: Secondary | ICD-10-CM | POA: Diagnosis present

## 2023-08-06 DIAGNOSIS — L02416 Cutaneous abscess of left lower limb: Secondary | ICD-10-CM | POA: Diagnosis not present

## 2023-08-06 DIAGNOSIS — M4802 Spinal stenosis, cervical region: Secondary | ICD-10-CM | POA: Diagnosis present

## 2023-08-06 DIAGNOSIS — F17210 Nicotine dependence, cigarettes, uncomplicated: Secondary | ICD-10-CM | POA: Diagnosis present

## 2023-08-06 DIAGNOSIS — Z5329 Procedure and treatment not carried out because of patient's decision for other reasons: Secondary | ICD-10-CM | POA: Diagnosis present

## 2023-08-06 DIAGNOSIS — F1510 Other stimulant abuse, uncomplicated: Secondary | ICD-10-CM | POA: Diagnosis present

## 2023-08-06 DIAGNOSIS — W57XXXA Bitten or stung by nonvenomous insect and other nonvenomous arthropods, initial encounter: Secondary | ICD-10-CM | POA: Diagnosis present

## 2023-08-06 DIAGNOSIS — R7881 Bacteremia: Secondary | ICD-10-CM | POA: Diagnosis present

## 2023-08-06 DIAGNOSIS — F1210 Cannabis abuse, uncomplicated: Secondary | ICD-10-CM | POA: Diagnosis present

## 2023-08-06 DIAGNOSIS — F1910 Other psychoactive substance abuse, uncomplicated: Secondary | ICD-10-CM | POA: Diagnosis present

## 2023-08-06 DIAGNOSIS — Z981 Arthrodesis status: Secondary | ICD-10-CM

## 2023-08-06 DIAGNOSIS — Z7151 Drug abuse counseling and surveillance of drug abuser: Secondary | ICD-10-CM

## 2023-08-06 DIAGNOSIS — Z83438 Family history of other disorder of lipoprotein metabolism and other lipidemia: Secondary | ICD-10-CM

## 2023-08-06 DIAGNOSIS — M7989 Other specified soft tissue disorders: Secondary | ICD-10-CM | POA: Diagnosis not present

## 2023-08-06 DIAGNOSIS — Z720 Tobacco use: Secondary | ICD-10-CM | POA: Diagnosis not present

## 2023-08-06 DIAGNOSIS — F15259 Other stimulant dependence with stimulant-induced psychotic disorder, unspecified: Secondary | ICD-10-CM | POA: Diagnosis not present

## 2023-08-06 DIAGNOSIS — F19259 Other psychoactive substance dependence with psychoactive substance-induced psychotic disorder, unspecified: Secondary | ICD-10-CM | POA: Diagnosis not present

## 2023-08-06 DIAGNOSIS — F3130 Bipolar disorder, current episode depressed, mild or moderate severity, unspecified: Secondary | ICD-10-CM | POA: Diagnosis present

## 2023-08-06 DIAGNOSIS — F319 Bipolar disorder, unspecified: Secondary | ICD-10-CM | POA: Diagnosis not present

## 2023-08-06 DIAGNOSIS — F3160 Bipolar disorder, current episode mixed, unspecified: Secondary | ICD-10-CM | POA: Diagnosis not present

## 2023-08-06 LAB — CBC WITH DIFFERENTIAL/PLATELET
Abs Immature Granulocytes: 0.01 10*3/uL (ref 0.00–0.07)
Basophils Absolute: 0.1 10*3/uL (ref 0.0–0.1)
Basophils Relative: 1 %
Eosinophils Absolute: 0.1 10*3/uL (ref 0.0–0.5)
Eosinophils Relative: 1 %
HCT: 37.5 % — ABNORMAL LOW (ref 39.0–52.0)
Hemoglobin: 12.4 g/dL — ABNORMAL LOW (ref 13.0–17.0)
Immature Granulocytes: 0 %
Lymphocytes Relative: 36 %
Lymphs Abs: 2.4 10*3/uL (ref 0.7–4.0)
MCH: 29.5 pg (ref 26.0–34.0)
MCHC: 33.1 g/dL (ref 30.0–36.0)
MCV: 89.3 fL (ref 80.0–100.0)
Monocytes Absolute: 0.5 10*3/uL (ref 0.1–1.0)
Monocytes Relative: 7 %
Neutro Abs: 3.8 10*3/uL (ref 1.7–7.7)
Neutrophils Relative %: 55 %
Platelets: 313 10*3/uL (ref 150–400)
RBC: 4.2 MIL/uL — ABNORMAL LOW (ref 4.22–5.81)
RDW: 13 % (ref 11.5–15.5)
WBC: 6.7 10*3/uL (ref 4.0–10.5)
nRBC: 0 % (ref 0.0–0.2)

## 2023-08-06 LAB — COMPREHENSIVE METABOLIC PANEL
ALT: 20 U/L (ref 0–44)
AST: 29 U/L (ref 15–41)
Albumin: 3.8 g/dL (ref 3.5–5.0)
Alkaline Phosphatase: 58 U/L (ref 38–126)
Anion gap: 9 (ref 5–15)
BUN: 10 mg/dL (ref 6–20)
CO2: 28 mmol/L (ref 22–32)
Calcium: 8.9 mg/dL (ref 8.9–10.3)
Chloride: 101 mmol/L (ref 98–111)
Creatinine, Ser: 0.92 mg/dL (ref 0.61–1.24)
GFR, Estimated: >60 mL/min (ref >60)
Glucose, Bld: 83 mg/dL (ref 70–99)
Potassium: 3.9 mmol/L (ref 3.5–5.1)
Sodium: 138 mmol/L (ref 135–145)
Total Bilirubin: 0.4 mg/dL (ref <1.2)
Total Protein: 6.9 g/dL (ref 6.5–8.1)

## 2023-08-06 LAB — URINALYSIS, W/ REFLEX TO CULTURE (INFECTION SUSPECTED)
Bacteria, UA: NONE SEEN
Bilirubin Urine: NEGATIVE
Glucose, UA: NEGATIVE mg/dL
Hgb urine dipstick: NEGATIVE
Ketones, ur: NEGATIVE mg/dL
Leukocytes,Ua: NEGATIVE
Nitrite: NEGATIVE
Protein, ur: NEGATIVE mg/dL
Specific Gravity, Urine: 1.014 (ref 1.005–1.030)
pH: 7 (ref 5.0–8.0)

## 2023-08-06 LAB — I-STAT CG4 LACTIC ACID, ED: Lactic Acid, Venous: 0.3 mmol/L — ABNORMAL LOW (ref 0.5–1.9)

## 2023-08-06 MED ORDER — ONDANSETRON HCL 4 MG/2ML IJ SOLN: 4.0000 mg | Freq: Four times a day (QID) | INTRAMUSCULAR | Status: DC | PRN

## 2023-08-06 MED ORDER — ACETAMINOPHEN 325 MG PO TABS
650.0000 mg | ORAL_TABLET | Freq: Four times a day (QID) | ORAL | Status: DC | PRN
  Administered 2023-08-09 – 2023-08-13 (×2): 650 mg via ORAL
  Filled 2023-08-06 (×3): qty 2

## 2023-08-06 MED ORDER — ACETAMINOPHEN 650 MG RE SUPP: 650.0000 mg | Freq: Four times a day (QID) | RECTAL | Status: DC | PRN

## 2023-08-06 MED ORDER — CEFAZOLIN SODIUM-DEXTROSE 2-4 GM/100ML-% IV SOLN
2.0000 g | Freq: Three times a day (TID) | INTRAVENOUS | Status: DC
  Administered 2023-08-06 – 2023-08-18 (×37): 2 g via INTRAVENOUS
  Filled 2023-08-06 (×37): qty 100

## 2023-08-06 MED ORDER — ONDANSETRON HCL 4 MG PO TABS: 4.0000 mg | ORAL_TABLET | Freq: Four times a day (QID) | ORAL | Status: DC | PRN

## 2023-08-06 MED ORDER — SENNOSIDES-DOCUSATE SODIUM 8.6-50 MG PO TABS: 1.0000 | ORAL_TABLET | Freq: Every evening | ORAL | Status: DC | PRN

## 2023-08-06 MED ORDER — ENOXAPARIN SODIUM 40 MG/0.4ML IJ SOSY
40.0000 mg | PREFILLED_SYRINGE | INTRAMUSCULAR | Status: DC
  Administered 2023-08-06 – 2023-08-17 (×12): 40 mg via SUBCUTANEOUS
  Filled 2023-08-06 (×12): qty 0.4

## 2023-08-06 MED ORDER — CEFAZOLIN SODIUM-DEXTROSE 2-4 GM/100ML-% IV SOLN
2.0000 g | Freq: Once | INTRAVENOUS | Status: DC
  Filled 2023-08-06: qty 100

## 2023-08-06 NOTE — Telephone Encounter (Signed)
Post ED Visit - Positive Culture Follow-up: Unsuccessful Patient Follow-up  Culture assessed and recommendations reviewed by:  [x]  Basil Dess, Pharm.D. []  Celedonio Miyamoto, Pharm.D., BCPS AQ-ID []  Garvin Fila, Pharm.D., BCPS []  Georgina Pillion, Pharm.D., BCPS []  [place], [address].D., BCPS, AAHIVP []  Estella Husk, Pharm.D., BCPS, AAHIVP []  Sherlynn Carbon, PharmD []  Pollyann Samples, PharmD, BCPS  Positive blood culture  [x]  Patient discharged without antimicrobial prescription and treatment is now indicated []  Organism is resistant to prescribed ED discharge antimicrobial []  Patient with positive blood cultures  Plan: Return to ED for treatment per Elpidio Anis, PA-C  Unable to contact patient , letter will be sent to address on file  Patsey Berthold 08/06/2023, 1:00 PM
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
## 2023-08-06 NOTE — ED Triage Notes (Signed)
Pt here for a wound on his L leg. States that he was seen at [facility] and that they wanted him to stay so he left. States that the police came to his house and told him he had to come in, also states that he was called and told that he needed to come in to be admitted. Reports meth use today, states that he has shot meth into his L leg.

## 2023-08-06 NOTE — ED Provider Notes (Signed)
[place] EMERGENCY DEPARTMENT AT [facility] Provider Note   CSN: [id number] Arrival date & time: 08/06/23  1523     History  Chief Complaint  Patient presents with   Wound Infection    Trevor [name] is a 42 y.o. male with past medical history of IV drug use and bipolar disorder presents for left lower extremity pain and chronic wound on his knee stating that the police came to his house for a welfare check and told him he needed to come to the hospital because of infection in his blood.  Patient was seen at this facility on 11/13 where an I&D was performed on his left knee abscess.  Patient was discharged on course of Doxy.  He was told to return to the ED following positive blood cultures.  He presented to [facility] where he was admitted for MSSA bacteremia and treated with cefazolin.  He later left AMA.  Today he endorses left lower extremity pain and worsening of his wound.  Reports draining of white, green, bloody fluid from the wound.  States he has been feeling hot and cold on and off.  He states he feels out of it.  He admits to daily meth and cannabis use.  Of note when asked if he used drugs today patient became very agitated and verbally aggressive.     HPI     Home Medications Prior to Admission medications   Medication Sig Start Date End Date Taking? Authorizing Provider  gabapentin (NEURONTIN) 400 MG capsule Take 1 capsule (400 mg total) by mouth 3 (three) times daily. 07/31/18  Yes Micheal Likens, MD  acetaminophen (TYLENOL) 325 MG tablet Take 2 tablets (650 mg total) by mouth every 6 (six) hours as needed for mild pain, moderate pain or fever. Patient not taking: Reported on 08/05/2023 10/02/19   Caccavale, Sophia, PA-C  atorvastatin (LIPITOR) 20 MG tablet Take 1 tablet (20 mg total) by mouth daily at 6 PM. Patient not taking: Reported on 08/05/2023 07/31/18   Micheal Likens, MD  doxepin (SINEQUAN) 75 MG capsule Take 150 mg by mouth at  bedtime. Patient not taking: Reported on 08/05/2023    [provider]  doxepin (SINEQUAN) 75 MG capsule Take 1 capsule (75 mg total) by mouth at bedtime. Patient not taking: Reported on 08/05/2023 12/17/20   Marcellina Millin, MD  doxycycline (VIBRAMYCIN) 100 MG capsule Take 1 capsule (100 mg total) by mouth 2 (two) times daily. Patient not taking: Reported on 08/06/2023 08/03/23   Barrie Dunker B, PA-C  hydrOXYzine (ATARAX/VISTARIL) 50 MG tablet Take 1 tablet (50 mg total) by mouth every 6 (six) hours as needed for anxiety. Patient not taking: Reported on 08/05/2023 07/31/18   Micheal Likens, MD  ibuprofen (ADVIL) 600 MG tablet Take 1 tablet (600 mg total) by mouth every 6 (six) hours as needed. Patient not taking: Reported on 08/06/2023 03/23/23   Horton, Clabe Seal, DO  Melatonin 5 MG TABS Take 1 tablet (5 mg total) by mouth daily at 8 pm. Patient not taking: Reported on 08/05/2023 07/31/18   Micheal Likens, MD  METHADONE HCL PO Take 115 mg by mouth daily at 6 (six) AM.  Patient not taking: Reported on 08/05/2023    [provider]  naproxen (NAPROSYN) 500 MG tablet Take 1 tablet (500 mg total) by mouth 2 (two) times daily. Patient not taking: Reported on 08/05/2023 02/02/23   Horton, Clabe Seal, DO  nicotine (NICODERM CQ - DOSED  IN MG/24 HOURS) 21 mg/24hr patch Place 1 patch (21 mg total) onto the skin daily. Patient not taking: Reported on 08/05/2023 07/31/18   Micheal Likens, MD  OLANZapine (ZYPREXA) 10 MG tablet Take 1 tablet (10 mg total) by mouth at bedtime. Patient not taking: Reported on 08/05/2023 07/31/18   Micheal Likens, MD  ondansetron (ZOFRAN) 4 MG tablet Take 1 tablet (4 mg total) by mouth every 6 (six) hours. Patient not taking: Reported on 08/06/2023 02/02/23   Horton, Danford Bad M, DO  QUEtiapine (SEROQUEL) 100 MG tablet Take 1 tablet (100 mg total) by mouth at bedtime. Patient not taking: Reported on 08/05/2023  12/17/20   Marcellina Millin, MD  sertraline (ZOLOFT) 100 MG tablet Take 1 tablet (100 mg total) by mouth daily. Patient not taking: Reported on 08/06/2023 08/01/18   Micheal Likens, MD  tamsulosin (FLOMAX) 0.4 MG CAPS capsule Take 1 capsule (0.4 mg total) by mouth daily after supper. Patient not taking: Reported on 08/05/2023 03/09/22   Dietrich Pates, PA-C      Allergies    Augmentin [amoxicillin-pot clavulanate]    Review of Systems   Review of Systems  Musculoskeletal:  Positive for arthralgias and myalgias.    Physical Exam Updated Vital Signs BP 126/80 (BP Location: Right Arm)   Pulse 99   Temp 98.2 F (36.8 C)   Resp 13   Ht 5\' 11"  (1.803 m)   Wt 74.8 kg   SpO2 100%   BMI 23.00 kg/m  Physical Exam Vitals and nursing note reviewed.  Constitutional:      Appearance: He is well-developed.     Comments: Patient in no acute distress, chronically ill-appearing, intermittently agitated and lethargic.  Admits to daily drug use  HENT:     Head: Normocephalic and atraumatic.  Eyes:     Conjunctiva/sclera: Conjunctivae normal.  Cardiovascular:     Rate and Rhythm: Normal rate and regular rhythm.     Heart sounds: No murmur heard. Pulmonary:     Effort: Pulmonary effort is normal. No respiratory distress.     Breath sounds: Normal breath sounds.  Abdominal:     Palpations: Abdomen is soft.     Tenderness: There is no abdominal tenderness.  Musculoskeletal:     Cervical back: Neck supple.     Comments: Abscess on the medial aspect of the left knee as seen in the photo below, with surrounding erythema and warmth.  Wound is indurated and tender with overlying eschar and purulence present.  He has swelling down his left lower extremity and pain in his left calf.  Pulses are symmetric.  He is able to weight-bear, full knee range of motion.  Skin:    General: Skin is warm and dry.     Capillary Refill: Capillary refill takes less than 2 seconds.  Neurological:      General: No focal deficit present.     Mental Status: He is alert.  Psychiatric:        Mood and Affect: Mood normal.      ED Results / Procedures / Treatments   Labs (all labs ordered are listed, but only abnormal results are displayed) Labs Reviewed  CBC WITH DIFFERENTIAL/PLATELET - Abnormal; Notable for the following components:      Result Value   RBC 4.20 (*)    Hemoglobin 12.4 (*)    HCT 37.5 (*)    All other components within normal limits  I-STAT CG4 LACTIC ACID, ED - Abnormal; Notable for  the following components:   Lactic Acid, Venous 0.3 (*)    All other components within normal limits  COMPREHENSIVE METABOLIC PANEL  URINALYSIS, W/ REFLEX TO CULTURE (INFECTION SUSPECTED)  HIV ANTIBODY (ROUTINE TESTING W REFLEX)    EKG None  Radiology DG Chest Port 1 View  Result Date: 08/05/2023 CLINICAL DATA:  Questionable sepsis, evaluate for abnormality peer EXAM: PORTABLE CHEST 1 VIEW COMPARISON:  08/02/2023 FINDINGS: Cardiac and mediastinal contours are within normal limits. No focal pulmonary opacity. No pleural effusion or pneumothorax. No acute osseous abnormality. IMPRESSION: No acute cardiopulmonary process. Electronically Signed   By: Wiliam Ke M.D.   On: 08/05/2023 12:12    Procedures Procedures    Medications Ordered in ED Medications  ceFAZolin (ANCEF) IVPB 2g/100 mL premix (0 g Intravenous Hold 08/06/23 1802)  enoxaparin (LOVENOX) injection 40 mg (has no administration in time range)  acetaminophen (TYLENOL) tablet 650 mg (has no administration in time range)    Or  acetaminophen (TYLENOL) suppository 650 mg (has no administration in time range)  senna-docusate (Senokot-S) tablet 1 tablet (has no administration in time range)  ondansetron (ZOFRAN) tablet 4 mg (has no administration in time range)    Or  ondansetron (ZOFRAN) injection 4 mg (has no administration in time range)    ED Course/ Medical Decision Making/ A&P Clinical Course as of 08/06/23  1950  Sat Aug 06, 2023  1943 Patient was frequently verbally abusive to the nursing and staff during his visit.  Accusing them of judging him for drug use and treating him poorly, despite any evidence of this.  Discussed with patient his behavior.  He voices understanding that he has to respect staff and knows he needs to stay [JT]    Clinical Course User Index [JT] Halford Decamp, PA-C                                 Medical Decision Making Amount and/or Complexity of Data Reviewed Labs: ordered.   This patient presents to the ED with chief complaint(s) of left lower extremity wound with pertinent past medical history of IV drug use.  The complaint involves an extensive differential diagnosis and also carries with it a high risk of complications and morbidity.    The differential diagnosis includes  Abscess, cellulitis, DVT, septic joint, bacteremia The initial plan is to  Basic labs, resume Ancef, plan for bounce back admission Additional history obtained: Additional historians and records include recent ED and admission notes  Initial Assessment:   Patient chronically ill IV drug user with chronic left lower extremity wound with recent admission for MSSA bacteremia.  He is hemodynamically stable.  It is unclear how long infectious disease wanted to treat the patient with cefazolin or if or if his treatment was completed prior to leaving AMA..  That a echo was planned however this was never completed before his leave.   Independent ECG/labs interpretation:  The following labs were independently interpreted:  downtrending lactate, no leukocytosis today  Independent visualization and interpretation of imaging: Considered venous Doppler of left lower extremity, however this was not available this evening.  During patient's admission this can be reevaluated.  Treatment and Reassessment: Resumed IV antibiotics with cefazolin  Consultations obtained:   Hospitalist: Dr.  Kirke Corin  Disposition:   Plan for bounce back admission for continued IV antibiotics and echo per IDs original recs before leaving AMA.   Social Determinants of Health:  Patient's  IV drug use   increases the complexity of managing their presentation         Final Clinical Impression(s) / ED Diagnoses Final diagnoses:  MSSA bacteremia    Rx / DC Orders ED Discharge Orders     None         Fabienne Bruns 08/06/23 2114    Laurence Spates, MD 08/07/23 [phone]
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
## 2023-08-07 ENCOUNTER — Inpatient Hospital Stay (HOSPITAL_COMMUNITY): Payer: BLUE CROSS/BLUE SHIELD

## 2023-08-07 ENCOUNTER — Encounter (HOSPITAL_COMMUNITY): Payer: BLUE CROSS/BLUE SHIELD

## 2023-08-07 DIAGNOSIS — B9561 Methicillin susceptible Staphylococcus aureus infection as the cause of diseases classified elsewhere: Secondary | ICD-10-CM | POA: Diagnosis not present

## 2023-08-07 DIAGNOSIS — L02416 Cutaneous abscess of left lower limb: Secondary | ICD-10-CM

## 2023-08-07 DIAGNOSIS — R7881 Bacteremia: Secondary | ICD-10-CM

## 2023-08-07 LAB — BASIC METABOLIC PANEL
Anion gap: 6 (ref 5–15)
BUN: 17 mg/dL (ref 6–20)
CO2: 29 mmol/L (ref 22–32)
Calcium: 9.1 mg/dL (ref 8.9–10.3)
Chloride: 102 mmol/L (ref 98–111)
Creatinine, Ser: 0.9 mg/dL (ref 0.61–1.24)
GFR, Estimated: >60 mL/min (ref >60)
Glucose, Bld: 123 mg/dL — ABNORMAL HIGH (ref 70–99)
Potassium: 3.9 mmol/L (ref 3.5–5.1)
Sodium: 137 mmol/L (ref 135–145)

## 2023-08-07 LAB — CBC
HCT: 38.3 % — ABNORMAL LOW (ref 39.0–52.0)
Hemoglobin: 12.7 g/dL — ABNORMAL LOW (ref 13.0–17.0)
MCH: 29.7 pg (ref 26.0–34.0)
MCHC: 33.2 g/dL (ref 30.0–36.0)
MCV: 89.7 fL (ref 80.0–100.0)
Platelets: 284 10*3/uL (ref 150–400)
RBC: 4.27 MIL/uL (ref 4.22–5.81)
RDW: 13 % (ref 11.5–15.5)
WBC: 4.9 10*3/uL (ref 4.0–10.5)
nRBC: 0 % (ref 0.0–0.2)

## 2023-08-07 LAB — HIV ANTIBODY (ROUTINE TESTING W REFLEX): HIV Screen 4th Generation wRfx: NONREACTIVE

## 2023-08-07 MED ORDER — LORAZEPAM 2 MG/ML IJ SOLN
1.0000 mg | Freq: Once | INTRAMUSCULAR | Status: AC
  Administered 2023-08-07: 1 mg via INTRAVENOUS
  Filled 2023-08-07: qty 1

## 2023-08-07 MED ORDER — GADOBUTROL 1 MMOL/ML IV SOLN
7.0000 mL | Freq: Once | INTRAVENOUS | Status: AC | PRN
  Administered 2023-08-07: 7 mL via INTRAVENOUS

## 2023-08-07 NOTE — Progress Notes (Signed)
[facility]  Date of Admission:  08/06/2023      Abx: cefazolin  ASSESSMENT: 42 yo male hx lumbar fusion (no hardware per his report), substance abuse here with mssa bsi and rle abscess I&D in ER on 11/12, left ama now readmitted on 11/16  11/12 bcx mssa 11/15 repeat bcx ngtd  Tte ordered 11/16, pending    Abscess next the the left knee associated cellulitis appears better; no knee joint involvement clinically Midline thoracic spine around t10 area tenderness -- no neurological deficit; no other focal site of metastatic foci of infection on hx/exam at this time  PLAN: Would get tee in him as well and we'll arrange with cardiology tomorrow Continue cefazolin F/u repeat bcx from 11/15 Await tte T-spine mri Discussed with primary team   Principal Problem:   MSSA bacteremia Active Problems:   Abscess of left lower extremity   Allergies  Allergen Reactions   Augmentin [Amoxicillin-Pot Clavulanate] Hives    Has patient had a PCN reaction causing immediate rash, facial/tongue/throat swelling, SOB or lightheadedness with hypotension: Yes Has patient had a PCN reaction causing severe rash involving mucus membranes or skin necrosis: No Has patient had a PCN reaction that required hospitalization: No Has patient had a PCN reaction occurring within the last 10 years: No If all of the above answers are "NO", then may proceed with Cephalosporin use.     Scheduled Meds:  enoxaparin (LOVENOX) injection  40 mg Subcutaneous Q24H   Continuous Infusions:   ceFAZolin (ANCEF) IV 2 g (08/07/23 0611)   PRN Meds:.acetaminophen **OR** acetaminophen, ondansetron **OR** ondansetron (ZOFRAN) IV, senna-docusate   SUBJECTIVE: Left ama but returned 11/16 11/15 bcx repeat negative No leukocytosis No complaint in terms of headache, visual change, joint pain Acute on chronic mid thoracic back pain   Review of Systems: ROS All other ROS was  negative, except mentioned above     OBJECTIVE: Vitals:   08/06/23 2105 08/07/23 0053 08/07/23 0518 08/07/23 0735  BP: 125/80 136/85 (!) 132/91 121/80  Pulse: 73 98 83 80  Resp: 18 17 18 16   Temp: 97.7 F (36.5 C) 97.8 F (36.6 C) 97.7 F (36.5 C) 97.7 F (36.5 C)  TempSrc: Oral Oral Oral Oral  SpO2: 100% 98% 100% 100%  Weight:      Height:       Body mass index is 23 kg/m.  Physical Exam General/constitutional: no distress, pleasant HEENT: normocephalic; conj clear no injection Neck supple CV: rrr no mrg Lungs: clear to auscultation, normal respiratory effort Abd: Soft, Nontender Ext: no edema Skin: serosanguinous oozing medial abscess I&D site; no cellulitis changes Neuro: nonfocal MSK: t10 area midline tenderness   Lab Results Lab Results  Component Value Date   WBC 4.9 08/07/2023   HGB 12.7 (L) 08/07/2023   HCT 38.3 (L) 08/07/2023   MCV 89.7 08/07/2023   PLT 284 08/07/2023    Lab Results  Component Value Date   CREATININE 0.90 08/07/2023   BUN 17 08/07/2023   NA 137 08/07/2023   K 3.9 08/07/2023   CL 102 08/07/2023   CO2 29 08/07/2023    Lab Results  Component Value Date   ALT 20 08/06/2023   AST 29 08/06/2023   ALKPHOS 58 08/06/2023   BILITOT 0.4 08/06/2023      Microbiology: Recent Results (from the past 240 hour(s))  Culture, blood (Routine x 2)     Status: Abnormal   Collection Time:  08/02/23 11:30 PM   Specimen: BLOOD  Result Value Ref Range Status   Specimen Description   Final    BLOOD BLOOD LEFT ARM Performed at [facility], [address], [place], [place] [address]    Special Requests   Final    BOTTLES DRAWN AEROBIC ONLY Blood Culture results may not be optimal due to an inadequate volume of blood received in culture bottles Performed at [facility], [address] [place], [place] [address]    Culture  Setup Time   Final    GRAM POSITIVE COCCI IN CLUSTERS AEROBIC BOTTLE  ONLY CRITICAL RESULT CALLED TO, READ BACK BY AND VERIFIED WITH: NVivia Ewing RN 08/03/23 @ 2130 BY AB Performed at [facility], [address], [place], [place] [address]    Culture STAPHYLOCOCCUS AUREUS (A)  Final   Report Status 08/05/2023 FINAL  Final   Organism ID, Bacteria STAPHYLOCOCCUS AUREUS  Final      Susceptibility   Staphylococcus aureus - MIC*    CIPROFLOXACIN <=0.5 SENSITIVE Sensitive     ERYTHROMYCIN <=0.25 SENSITIVE Sensitive     GENTAMICIN <=0.5 SENSITIVE Sensitive     OXACILLIN 0.5 SENSITIVE Sensitive     TETRACYCLINE <=1 SENSITIVE Sensitive     VANCOMYCIN 1 SENSITIVE Sensitive     TRIMETH/SULFA <=10 SENSITIVE Sensitive     CLINDAMYCIN <=0.25 SENSITIVE Sensitive     RIFAMPIN <=0.5 SENSITIVE Sensitive     Inducible Clindamycin NEGATIVE Sensitive     LINEZOLID 2 SENSITIVE Sensitive     * STAPHYLOCOCCUS AUREUS  Blood Culture ID Panel (Reflexed)     Status: Abnormal   Collection Time: 08/02/23 11:30 PM  Result Value Ref Range Status   Enterococcus faecalis NOT DETECTED NOT DETECTED Final   Enterococcus Faecium NOT DETECTED NOT DETECTED Final   Listeria monocytogenes NOT DETECTED NOT DETECTED Final   Staphylococcus species DETECTED (A) NOT DETECTED Final    Comment: CRITICAL RESULT CALLED TO, READ BACK BY AND VERIFIED WITH: N. WHEATLEY RN 08/03/23 @ 2130 BY AB    Staphylococcus aureus (BCID) DETECTED (A) NOT DETECTED Final    Comment: CRITICAL RESULT CALLED TO, READ BACK BY AND VERIFIED WITH: N. WHEATLEY RN 08/03/23 @ 2130 BY AB    Staphylococcus epidermidis NOT DETECTED NOT DETECTED Final   Staphylococcus lugdunensis NOT DETECTED NOT DETECTED Final   Streptococcus species NOT DETECTED NOT DETECTED Final   Streptococcus agalactiae NOT DETECTED NOT DETECTED Final   Streptococcus pneumoniae NOT DETECTED NOT DETECTED Final   Streptococcus pyogenes NOT DETECTED NOT DETECTED Final   A.calcoaceticus-baumannii NOT DETECTED NOT DETECTED Final   Bacteroides  fragilis NOT DETECTED NOT DETECTED Final   Enterobacterales NOT DETECTED NOT DETECTED Final   Enterobacter cloacae complex NOT DETECTED NOT DETECTED Final   Escherichia coli NOT DETECTED NOT DETECTED Final   Klebsiella aerogenes NOT DETECTED NOT DETECTED Final   Klebsiella oxytoca NOT DETECTED NOT DETECTED Final   Klebsiella pneumoniae NOT DETECTED NOT DETECTED Final   Proteus species NOT DETECTED NOT DETECTED Final   Salmonella species NOT DETECTED NOT DETECTED Final   Serratia marcescens NOT DETECTED NOT DETECTED Final   Haemophilus influenzae NOT DETECTED NOT DETECTED Final   Neisseria meningitidis NOT DETECTED NOT DETECTED Final   Pseudomonas aeruginosa NOT DETECTED NOT DETECTED Final   Stenotrophomonas maltophilia NOT DETECTED NOT DETECTED Final   Candida albicans NOT DETECTED NOT DETECTED Final   Candida auris NOT DETECTED NOT DETECTED Final   Candida glabrata NOT  DETECTED NOT DETECTED Final   Candida krusei NOT DETECTED NOT DETECTED Final   Candida parapsilosis NOT DETECTED NOT DETECTED Final   Candida tropicalis NOT DETECTED NOT DETECTED Final   Cryptococcus neoformans/gattii NOT DETECTED NOT DETECTED Final   Meth resistant mecA/C and MREJ NOT DETECTED NOT DETECTED Final    Comment: Performed at [facility], [address], [place], [place] [address]  Resp panel by RT-PCR (RSV, Flu A&B, Covid) Anterior Nasal Swab     Status: None   Collection Time: 08/05/23  9:49 AM   Specimen: Anterior Nasal Swab  Result Value Ref Range Status   SARS Coronavirus 2 by RT PCR NEGATIVE NEGATIVE Final    Comment: (NOTE) SARS-CoV-2 target nucleic acids are NOT DETECTED.  The SARS-CoV-2 RNA is generally detectable in upper respiratory specimens during the acute phase of infection. The lowest concentration of SARS-CoV-2 viral copies this assay can detect is 138 copies/mL. A negative result does not preclude SARS-Cov-2 infection and should not be used as the sole basis for treatment  or other patient management decisions. A negative result may occur with  improper specimen collection/handling, submission of specimen other than nasopharyngeal swab, presence of viral mutation(s) within the areas targeted by this assay, and inadequate number of viral copies(<138 copies/mL). A negative result must be combined with clinical observations, patient history, and epidemiological information. The expected result is Negative.  Fact Sheet for Patients:  BloggerCourse.com  Fact Sheet for Healthcare Providers:  SeriousBroker.it  This test is no t yet approved or cleared by the [place] FDA and  has been authorized for detection and/or diagnosis of SARS-CoV-2 by FDA under an Emergency Use Authorization (EUA). This EUA will remain  in effect (meaning this test can be used) for the duration of the COVID-19 declaration under Section 564(b)(1) of the Act, 21 U.S.C.section 360bbb-3(b)(1), unless the authorization is terminated  or revoked sooner.       Influenza A by PCR NEGATIVE NEGATIVE Final   Influenza B by PCR NEGATIVE NEGATIVE Final    Comment: (NOTE) The Xpert Xpress SARS-CoV-2/FLU/RSV plus assay is intended as an aid in the diagnosis of influenza from Nasopharyngeal swab specimens and should not be used as a sole basis for treatment. Nasal washings and aspirates are unacceptable for Xpert Xpress SARS-CoV-2/FLU/RSV testing.  Fact Sheet for Patients: BloggerCourse.com  Fact Sheet for Healthcare Providers: SeriousBroker.it  This test is not yet approved or cleared by the [place] FDA and has been authorized for detection and/or diagnosis of SARS-CoV-2 by FDA under an Emergency Use Authorization (EUA). This EUA will remain in effect (meaning this test can be used) for the duration of the COVID-19 declaration under Section 564(b)(1) of the Act, 21 U.S.C. section  360bbb-3(b)(1), unless the authorization is terminated or revoked.     Resp Syncytial Virus by PCR NEGATIVE NEGATIVE Final    Comment: (NOTE) Fact Sheet for Patients: BloggerCourse.com  Fact Sheet for Healthcare Providers: SeriousBroker.it  This test is not yet approved or cleared by the [place] FDA and has been authorized for detection and/or diagnosis of SARS-CoV-2 by FDA under an Emergency Use Authorization (EUA). This EUA will remain in effect (meaning this test can be used) for the duration of the COVID-19 declaration under Section 564(b)(1) of the Act, 21 U.S.C. section 360bbb-3(b)(1), unless the authorization is terminated or revoked.  Performed at [facility], [address] [place], [place] [address]   Blood Culture (routine x 2)  Status: None (Preliminary result)   Collection Time: 08/05/23 10:30 AM   Specimen: BLOOD  Result Value Ref Range Status   Specimen Description   Final    BLOOD SITE NOT SPECIFIED Performed at [facility], [address] [place], [place] [address]    Special Requests   Final    BOTTLES DRAWN AEROBIC AND ANAEROBIC Blood Culture results may not be optimal due to an excessive volume of blood received in culture bottles Performed at [facility], [address] [place], [place] [address]    Culture   Final    NO GROWTH 2 DAYS Performed at [facility], [address] [place], [place] [address]    Report Status PENDING  Incomplete  Blood Culture (routine x 2)     Status: None (Preliminary result)   Collection Time: 08/05/23 10:30 AM   Specimen: BLOOD  Result Value Ref Range Status   Specimen Description   Final    BLOOD SITE NOT SPECIFIED Performed at [facility], [address], [place], [place] [address]    Special Requests   Final    BOTTLES DRAWN AEROBIC AND ANAEROBIC Blood Culture results  may not be optimal due to an excessive volume of blood received in culture bottles Performed at [facility], [address] [place], [place] [address]    Culture   Final    NO GROWTH 2 DAYS Performed at [facility], [address], [place], [place] [address]    Report Status PENDING  Incomplete     Serology:   Imaging: If present, new imagings (plain films, ct scans, and mri) have been personally visualized and interpreted; radiology reports have been reviewed. Decision making incorporated into the Impression / Recommendations.  11/15 cxr No acute process  Raymondo Band, MD [facility] [phone] pager    08/07/2023, 11:56 AM

## 2023-08-07 NOTE — Progress Notes (Signed)
Pt arrived to the unit 2100. Pt is Aox4, ambulatory, calm and cooperative. Pt requested U/S IV. IV placed RUA via ultrasound. 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 IV abx started. Pt has multiple scabbed 'bites' or 'puncture' wounds all over his body. Pt stated he shot drugs up in arms and legs only-showing specific sites. 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 Pt asking for resources to "help me get clean". Pt states he would like to restart anti-psychotics but doesn't have a way to pay for them. Pt denies having a place to live or any support systems. Pt asking for food: provided with multiple packages of graham crackers, PB, 2 stby and 2 van ice creams, [place] sandwich bag and 3 vanilla ensures per request. Pt has no teeth, denies any difficulty with chewing. States " I can chew a steak because my gums are razors now". Pt has (dead) cell phone and back pack with him and visible vape pen. Pt denies bringing any drugs into hospital room. Pt informed of no smoking policy as pt asked to leave and go smoke outside. Pt offered nicotine patch. Pt refuses at this time. Pt Production assistant, radio. [employer] cord found in unclaimed lost and found with 'unknown source' note attached. Cord provided to pt for use. Pt grateful.

## 2023-08-07 NOTE — Plan of Care (Signed)

## 2023-08-07 NOTE — Progress Notes (Signed)
PROGRESS NOTE  Trevor [name]  WUX:[id number] DOB: 05/28/1981 DOA: 08/06/2023 PCP: Pcp, No   Brief Narrative: Patient is a 42 year old male with history of bipolar disorder, substance abuse, chronic pain syndrome on methadone, bipolar disorder, hyperlipidemia who was initially seen in the emergency department for the evaluation of an right  lower extremity abscess which was drained and was discharged on oral antibiotics.  Recalled to the emergency department due to positive blood cultures for MSSA which was sent on 11/12.Marland Kitchen  He declined to stay for admission and signed AMA.  Patient presented himself again for admission with intention for treatment of bacteremia.  Currently febrile.  Started on cefazolin.  ID consulted.  Plan for TEE.  Patient has high chance  of signing  AMA anytime  Assessment & Plan:  Principal Problem:   MSSA bacteremia Active Problems:   Abscess of left lower extremity   MSSA bacteremia/right lower extremity abscess: Presented initially on 11/12 for complaint of in insect bite on his left knee.  Found to have erythema, swelling and abscess on the medial side of the right knee.  It was drained and he was sent home on oral antibiotics but he was recalled after blood culture sent from 11/12 showed MSSA.  Patient came to the emergency department but signed AMA.  He then presented again. Currently he is afebrile.  No leukocytosis.  Started on cefazolin as per previous blood culture report.  Repeat blood cultures have been sent.  ID consulted, he may need TEE to rule out endocarditis.  History of substance abuse. Last IV drug use with methamphetamine just few days ago Abscess around the left knee appears dry and healing well  Substance abuse disorder: History of IV drug use.  Takes marijuana, methamphetamine.  Bipolar disorder: Currently not on medications.  We recommend outpatient follow-up with psychiatry  Smoking: Nicotine patch ordered.        DVT  prophylaxis:enoxaparin (LOVENOX) injection 40 mg Start: 08/06/23 1900     Code Status: Full Code  Family Communication: None at the bedside  Patient status:Inpatient  Patient is from :home  Anticipated discharge OZ:DGUY  Estimated DC date:after full work up   Consultants: ID  Procedures:None  Antimicrobials:  Anti-infectives (From admission, onward)    Start     Dose/Rate Route Frequency Ordered Stop   08/06/23 2200  ceFAZolin (ANCEF) IVPB 2g/100 mL premix        2 g 200 mL/hr over 30 Minutes Intravenous Every 8 hours 08/06/23 2049     08/06/23 1715  ceFAZolin (ANCEF) IVPB 2g/100 mL premix  Status:  Discontinued        2 g 200 mL/hr over 30 Minutes Intravenous  Once 08/06/23 1701 08/06/23 2049       Subjective: Patient seen and examined at bedside today.  Hemodynamically stable.  Febrile.  Eating his breakfast.  Says that he does not want to stay for long in the hospital.  Pain is well-controlled on the right knee  Objective: Vitals:   08/06/23 2105 08/07/23 0053 08/07/23 0518 08/07/23 0735  BP: 125/80 136/85 (!) 132/91 121/80  Pulse: 73 98 83 80  Resp: 18 17 18 16   Temp: 97.7 F (36.5 C) 97.8 F (36.6 C) 97.7 F (36.5 C) 97.7 F (36.5 C)  TempSrc: Oral Oral Oral Oral  SpO2: 100% 98% 100% 100%  Weight:      Height:        Intake/Output Summary (Last 24 hours) at 08/07/2023 0751 Last data filed at  08/07/2023 0500 Gross per 24 hour  Intake 1140 ml  Output --  Net 1140 ml   Filed Weights   08/06/23 1543  Weight: 74.8 kg    Examination:  General exam: Overall comfortable, not in distress, thin, malnourished HEENT: PERRL Respiratory system:  no wheezes or crackles  Cardiovascular system: S1 & S2 heard, RRR.  Gastrointestinal system: Abdomen is nondistended, soft and nontender. Central nervous system: Alert and oriented Extremities: No edema, no clubbing ,no cyanosis, abscess area around the medial right  knee, spontaneously draining/drying Skin:  No rashes, no ulcers,no icterus     Data Reviewed: I have personally reviewed following labs and imaging studies  CBC: Recent Labs  Lab 08/02/23 2330 08/05/23 1035 08/06/23 1555 08/07/23 0634  WBC 11.6* 5.7 6.7 4.9  NEUTROABS 8.7* 3.8 3.8  --   HGB 12.7* 12.1* 12.4* 12.7*  HCT 38.7* 36.5* 37.5* 38.3*  MCV 91.3 90.8 89.3 89.7  PLT 254 272 313 284   Basic Metabolic Panel: Recent Labs  Lab 08/02/23 2330 08/05/23 1035 08/06/23 1555 08/07/23 0634  NA 138 135 138 137  K 3.9 3.5 3.9 3.9  CL 105 102 101 102  CO2 25 27 28 29   GLUCOSE 107* 126* 83 123*  BUN 20 19 10 17   CREATININE 0.85 0.83 0.92 0.90  CALCIUM 8.9 8.6* 8.9 9.1     Recent Results (from the past 240 hour(s))  Culture, blood (Routine x 2)     Status: Abnormal   Collection Time: 08/02/23 11:30 PM   Specimen: BLOOD  Result Value Ref Range Status   Specimen Description   Final    BLOOD BLOOD LEFT ARM Performed at [facility], [address] [place], [place] [address]    Special Requests   Final    BOTTLES DRAWN AEROBIC ONLY Blood Culture results may not be optimal due to an inadequate volume of blood received in culture bottles Performed at [facility], [address] [place], [place] [address]    Culture  Setup Time   Final    GRAM POSITIVE COCCI IN CLUSTERS AEROBIC BOTTLE ONLY CRITICAL RESULT CALLED TO, READ BACK BY AND VERIFIED WITH: NVivia Ewing RN 08/03/23 @ 2130 BY AB Performed at [facility], [address] [place], [place] [address]    Culture STAPHYLOCOCCUS AUREUS (A)  Final   Report Status 08/05/2023 FINAL  Final   Organism ID, Bacteria STAPHYLOCOCCUS AUREUS  Final      Susceptibility   Staphylococcus aureus - MIC*    CIPROFLOXACIN <=0.5 SENSITIVE Sensitive     ERYTHROMYCIN <=0.25 SENSITIVE Sensitive     GENTAMICIN <=0.5 SENSITIVE Sensitive     OXACILLIN 0.5 SENSITIVE Sensitive     TETRACYCLINE <=1 SENSITIVE Sensitive     VANCOMYCIN 1  SENSITIVE Sensitive     TRIMETH/SULFA <=10 SENSITIVE Sensitive     CLINDAMYCIN <=0.25 SENSITIVE Sensitive     RIFAMPIN <=0.5 SENSITIVE Sensitive     Inducible Clindamycin NEGATIVE Sensitive     LINEZOLID 2 SENSITIVE Sensitive     * STAPHYLOCOCCUS AUREUS  Blood Culture ID Panel (Reflexed)     Status: Abnormal   Collection Time: 08/02/23 11:30 PM  Result Value Ref Range Status   Enterococcus faecalis NOT DETECTED NOT DETECTED Final   Enterococcus Faecium NOT DETECTED NOT DETECTED Final   Listeria monocytogenes NOT DETECTED NOT DETECTED Final   Staphylococcus species DETECTED (A) NOT DETECTED Final    Comment: CRITICAL RESULT CALLED TO, READ BACK  BY AND VERIFIED WITH: N. WHEATLEY RN 08/03/23 @ 2130 BY AB    Staphylococcus aureus (BCID) DETECTED (A) NOT DETECTED Final    Comment: CRITICAL RESULT CALLED TO, READ BACK BY AND VERIFIED WITH: N. WHEATLEY RN 08/03/23 @ 2130 BY AB    Staphylococcus epidermidis NOT DETECTED NOT DETECTED Final   Staphylococcus lugdunensis NOT DETECTED NOT DETECTED Final   Streptococcus species NOT DETECTED NOT DETECTED Final   Streptococcus agalactiae NOT DETECTED NOT DETECTED Final   Streptococcus pneumoniae NOT DETECTED NOT DETECTED Final   Streptococcus pyogenes NOT DETECTED NOT DETECTED Final   A.calcoaceticus-baumannii NOT DETECTED NOT DETECTED Final   Bacteroides fragilis NOT DETECTED NOT DETECTED Final   Enterobacterales NOT DETECTED NOT DETECTED Final   Enterobacter cloacae complex NOT DETECTED NOT DETECTED Final   Escherichia coli NOT DETECTED NOT DETECTED Final   Klebsiella aerogenes NOT DETECTED NOT DETECTED Final   Klebsiella oxytoca NOT DETECTED NOT DETECTED Final   Klebsiella pneumoniae NOT DETECTED NOT DETECTED Final   Proteus species NOT DETECTED NOT DETECTED Final   Salmonella species NOT DETECTED NOT DETECTED Final   Serratia marcescens NOT DETECTED NOT DETECTED Final   Haemophilus influenzae NOT DETECTED NOT DETECTED Final   Neisseria  meningitidis NOT DETECTED NOT DETECTED Final   Pseudomonas aeruginosa NOT DETECTED NOT DETECTED Final   Stenotrophomonas maltophilia NOT DETECTED NOT DETECTED Final   Candida albicans NOT DETECTED NOT DETECTED Final   Candida auris NOT DETECTED NOT DETECTED Final   Candida glabrata NOT DETECTED NOT DETECTED Final   Candida krusei NOT DETECTED NOT DETECTED Final   Candida parapsilosis NOT DETECTED NOT DETECTED Final   Candida tropicalis NOT DETECTED NOT DETECTED Final   Cryptococcus neoformans/gattii NOT DETECTED NOT DETECTED Final   Meth resistant mecA/C and MREJ NOT DETECTED NOT DETECTED Final    Comment: Performed at [facility], [address], [place], [place] [address]  Resp panel by RT-PCR (RSV, Flu A&B, Covid) Anterior Nasal Swab     Status: None   Collection Time: 08/05/23  9:49 AM   Specimen: Anterior Nasal Swab  Result Value Ref Range Status   SARS Coronavirus 2 by RT PCR NEGATIVE NEGATIVE Final    Comment: (NOTE) SARS-CoV-2 target nucleic acids are NOT DETECTED.  The SARS-CoV-2 RNA is generally detectable in upper respiratory specimens during the acute phase of infection. The lowest concentration of SARS-CoV-2 viral copies this assay can detect is 138 copies/mL. A negative result does not preclude SARS-Cov-2 infection and should not be used as the sole basis for treatment or other patient management decisions. A negative result may occur with  improper specimen collection/handling, submission of specimen other than nasopharyngeal swab, presence of viral mutation(s) within the areas targeted by this assay, and inadequate number of viral copies(<138 copies/mL). A negative result must be combined with clinical observations, patient history, and epidemiological information. The expected result is Negative.  Fact Sheet for Patients:  BloggerCourse.com  Fact Sheet for Healthcare Providers:  SeriousBroker.it  This test  is no t yet approved or cleared by the [place] FDA and  has been authorized for detection and/or diagnosis of SARS-CoV-2 by FDA under an Emergency Use Authorization (EUA). This EUA will remain  in effect (meaning this test can be used) for the duration of the COVID-19 declaration under Section 564(b)(1) of the Act, 21 U.S.C.section 360bbb-3(b)(1), unless the authorization is terminated  or revoked sooner.       Influenza A by PCR NEGATIVE NEGATIVE Final  Influenza B by PCR NEGATIVE NEGATIVE Final    Comment: (NOTE) The Xpert Xpress SARS-CoV-2/FLU/RSV plus assay is intended as an aid in the diagnosis of influenza from Nasopharyngeal swab specimens and should not be used as a sole basis for treatment. Nasal washings and aspirates are unacceptable for Xpert Xpress SARS-CoV-2/FLU/RSV testing.  Fact Sheet for Patients: BloggerCourse.com  Fact Sheet for Healthcare Providers: SeriousBroker.it  This test is not yet approved or cleared by the [place] FDA and has been authorized for detection and/or diagnosis of SARS-CoV-2 by FDA under an Emergency Use Authorization (EUA). This EUA will remain in effect (meaning this test can be used) for the duration of the COVID-19 declaration under Section 564(b)(1) of the Act, 21 U.S.C. section 360bbb-3(b)(1), unless the authorization is terminated or revoked.     Resp Syncytial Virus by PCR NEGATIVE NEGATIVE Final    Comment: (NOTE) Fact Sheet for Patients: BloggerCourse.com  Fact Sheet for Healthcare Providers: SeriousBroker.it  This test is not yet approved or cleared by the [place] FDA and has been authorized for detection and/or diagnosis of SARS-CoV-2 by FDA under an Emergency Use Authorization (EUA). This EUA will remain in effect (meaning this test can be used) for the duration of the COVID-19 declaration under Section  564(b)(1) of the Act, 21 U.S.C. section 360bbb-3(b)(1), unless the authorization is terminated or revoked.  Performed at [facility], [address] [place], [place] [address]   Blood Culture (routine x 2)     Status: None (Preliminary result)   Collection Time: 08/05/23 10:30 AM   Specimen: BLOOD  Result Value Ref Range Status   Specimen Description   Final    BLOOD SITE NOT SPECIFIED Performed at [facility], [address], [place], [place] [address]    Special Requests   Final    BOTTLES DRAWN AEROBIC AND ANAEROBIC Blood Culture results may not be optimal due to an excessive volume of blood received in culture bottles Performed at [facility], [address] [place], [place] [address]    Culture   Final    NO GROWTH < 24 HOURS Performed at [facility], [address], [place], [place] [address]    Report Status PENDING  Incomplete  Blood Culture (routine x 2)     Status: None (Preliminary result)   Collection Time: 08/05/23 10:30 AM   Specimen: BLOOD  Result Value Ref Range Status   Specimen Description   Final    BLOOD SITE NOT SPECIFIED Performed at [facility], [address], [place], [place] [address]    Special Requests   Final    BOTTLES DRAWN AEROBIC AND ANAEROBIC Blood Culture results may not be optimal due to an excessive volume of blood received in culture bottles Performed at [facility], [address] [place], [place] [address]    Culture   Final    NO GROWTH < 24 HOURS Performed at [facility], [address], [place], [place] [address]    Report Status PENDING  Incomplete     Radiology Studies: DG Chest Port 1 View  Result Date: 08/05/2023 CLINICAL DATA:  Questionable sepsis, evaluate for abnormality peer EXAM: PORTABLE CHEST 1 VIEW COMPARISON:  08/02/2023 FINDINGS: Cardiac and mediastinal contours are within normal limits. No focal  pulmonary opacity. No pleural effusion or pneumothorax. No acute osseous abnormality. IMPRESSION: No acute cardiopulmonary process. Electronically Signed   By: Elaina Pattee.D.  On: 08/05/2023 12:12    Scheduled Meds:  enoxaparin (LOVENOX) injection  40 mg Subcutaneous Q24H   Continuous Infusions:   ceFAZolin (ANCEF) IV 2 g (08/07/23 8657)     LOS: 1 day   Burnadette Pop, MD [facility] P11/17/2024, 7:51 AM

## 2023-08-07 NOTE — Plan of Care (Signed)

## 2023-08-07 NOTE — Progress Notes (Signed)
   [facility] has been requested to perform a transesophageal echocardiogram on Trevor [name] for bacteremia.     The patient does NOT have any absolute or relative contraindications to a Transesophageal Echocardiogram (TEE).  The patient has: No other conditions that may impact this procedure.    After careful review of history and examination, the risks and benefits of transesophageal echocardiogram have been explained including risks of esophageal damage, perforation (1:10,000 risk), bleeding, pharyngeal hematoma as well as other potential complications associated with conscious sedation including aspiration, arrhythmia, respiratory failure and death. Alternatives to treatment were discussed, questions were answered. Patient is willing to proceed.   Signed, Tereso Newcomer, PA-C  08/07/2023 10:45 AM

## 2023-08-08 ENCOUNTER — Other Ambulatory Visit (HOSPITAL_COMMUNITY): Payer: BLUE CROSS/BLUE SHIELD

## 2023-08-08 ENCOUNTER — Inpatient Hospital Stay (HOSPITAL_COMMUNITY): Payer: BLUE CROSS/BLUE SHIELD

## 2023-08-08 DIAGNOSIS — M7989 Other specified soft tissue disorders: Secondary | ICD-10-CM | POA: Diagnosis not present

## 2023-08-08 DIAGNOSIS — L02416 Cutaneous abscess of left lower limb: Secondary | ICD-10-CM | POA: Diagnosis not present

## 2023-08-08 DIAGNOSIS — R7881 Bacteremia: Secondary | ICD-10-CM | POA: Diagnosis not present

## 2023-08-08 DIAGNOSIS — B9561 Methicillin susceptible Staphylococcus aureus infection as the cause of diseases classified elsewhere: Secondary | ICD-10-CM | POA: Diagnosis not present

## 2023-08-08 MED ORDER — HYDROXYZINE HCL 10 MG PO TABS
10.0000 mg | ORAL_TABLET | Freq: Once | ORAL | Status: AC | PRN
  Administered 2023-08-08: 10 mg via ORAL
  Filled 2023-08-08: qty 1

## 2023-08-08 NOTE — Social Work (Signed)
CSW met with pt at bedside to complete assessment. Pt resting, requests CSW return later. Pt states he wants to speak with CSW, when he is more awake. TOC will continue to follow.

## 2023-08-08 NOTE — H&P (View-Only) (Signed)
[facility]  Date of Admission:  08/06/2023   Total days of inpatient antibiotics 3  Principal Problem:   MSSA bacteremia Active Problems:   Abscess of left lower extremity          Assessment: 42 year old male with history of lumbar fusion and no hardware per report IVDA, bipolar disorder male with: #MSSA bacteremia Likely 2/2 left knee infection #IVDA - Initially left AMA on 11/12 and readmitted with positive blood cultures on 11/15.  Repeat blood culture 11/15 no growth so far - TTE and TEE scheduled.  Patient ate this morning as/noted that he declined TEE. - He had reported some low back pain, MRI T-spine negative for infection. - Source likely left knee as there is an abscess with associated cellulitis. Only small knee joint effusion on [place].   Recommendations: -Continue cefazolin - Patient amenable to repeat TEE tomorrow - Follow repeat blood cultures on 11/15 - Given IVDA patient is not a candidate for PICC line for home antibiotics.   Microbiology:   Antibiotics: Cefazolin 11/15-present Cultures: Blood 11/12 1/1 MSSA 11/15 no growth 6x3 Urine  Other   SUBJECTIVE: Resting in bed.  Reports he ate this morning that is why declined TEE. Interval: Afebrile overnight.  WBC 4.9K.  Review of Systems: Review of Systems  All other systems reviewed and are negative.    Scheduled Meds:  enoxaparin (LOVENOX) injection  40 mg Subcutaneous Q24H   Continuous Infusions:   ceFAZolin (ANCEF) IV 2 g (08/08/23 0602)   PRN Meds:.acetaminophen **OR** acetaminophen, ondansetron **OR** ondansetron (ZOFRAN) IV, senna-docusate Allergies  Allergen Reactions   Augmentin [Amoxicillin-Pot Clavulanate] Hives    Has patient had a PCN reaction causing immediate rash, facial/tongue/throat swelling, SOB or lightheadedness with hypotension: Yes Has patient had a PCN reaction causing severe rash involving mucus membranes or skin necrosis: No Has  patient had a PCN reaction that required hospitalization: No Has patient had a PCN reaction occurring within the last 10 years: No If all of the above answers are "NO", then may proceed with Cephalosporin use.     OBJECTIVE: Vitals:   08/07/23 0735 08/07/23 1538 08/07/23 2123 08/08/23 0447  BP: 121/80 120/70 116/85 119/82  Pulse: 80 73 93 95  Resp: 16 16 18 18   Temp: 97.7 F (36.5 C) 97.6 F (36.4 C) 97.8 F (36.6 C) 98 F (36.7 C)  TempSrc: Oral Oral Oral Oral  SpO2: 100% 100% 100% 97%  Weight:      Height:       Body mass index is 23 kg/m.  Physical Exam Constitutional:      General: He is not in acute distress.    Appearance: He is normal weight. He is not toxic-appearing.  HENT:     Head: Normocephalic and atraumatic.     Right Ear: External ear normal.     Left Ear: External ear normal.     Nose: No congestion or rhinorrhea.     Mouth/Throat:     Mouth: Mucous membranes are moist.     Pharynx: Oropharynx is clear.  Eyes:     Extraocular Movements: Extraocular movements intact.     Conjunctiva/sclera: Conjunctivae normal.     Pupils: Pupils are equal, round, and reactive to light.  Cardiovascular:     Rate and Rhythm: Normal rate and regular rhythm.     Heart sounds: No murmur heard.    No friction rub. No gallop.  Pulmonary:  Effort: Pulmonary effort is normal.     Breath sounds: Normal breath sounds.  Abdominal:     General: Abdomen is flat. Bowel sounds are normal.     Palpations: Abdomen is soft.  Musculoskeletal:        General: No swelling. Normal range of motion.     Cervical back: Normal range of motion and neck supple.  Skin:    General: Skin is warm and dry.  Neurological:     General: No focal deficit present.     Mental Status: He is oriented to person, place, and time.  Psychiatric:        Mood and Affect: Mood normal.      Lab Results Lab Results  Component Value Date   WBC 4.9 08/07/2023   HGB 12.7 (L) 08/07/2023   HCT 38.3  (L) 08/07/2023   MCV 89.7 08/07/2023   PLT 284 08/07/2023    Lab Results  Component Value Date   CREATININE 0.90 08/07/2023   BUN 17 08/07/2023   NA 137 08/07/2023   K 3.9 08/07/2023   CL 102 08/07/2023   CO2 29 08/07/2023    Lab Results  Component Value Date   ALT 20 08/06/2023   AST 29 08/06/2023   ALKPHOS 58 08/06/2023   BILITOT 0.4 08/06/2023        Danelle Earthly, MD [facility] 08/08/2023, 1:03 PM  I have personally spent 52 minutes involved in face-to-face and non-face-to-face activities for this patient on the day of the visit. Professional time spent includes the following activities: Preparing to see the patient (review of tests), Obtaining and/or reviewing separately obtained history (admission/discharge record), Performing a medically appropriate examination and/or evaluation , Ordering medications/tests/procedures, referring and communicating with other health care professionals, Documenting clinical information in the EMR, Independently interpreting results (not separately reported), Communicating results to the patient/family/caregiver, Counseling and educating the patient/family/caregiver and Care coordination (not separately reported).

## 2023-08-08 NOTE — Progress Notes (Signed)
PROGRESS NOTE  Trevor [name]  WCB:[id number] DOB: [birth year] DOA: 08/06/2023 PCP: Pcp, No   Brief Narrative: Patient is a 42 year old male with history of bipolar disorder, substance abuse, chronic pain syndrome on methadone, bipolar disorder, hyperlipidemia who was initially seen in the emergency department for the evaluation of an right  lower extremity abscess which was drained and was discharged on oral antibiotics.  Recalled to the emergency department due to positive blood cultures for MSSA which was sent on 11/12.Marland Kitchen  He declined to stay for admission and signed AMA.  Patient presented himself again for admission with intention for treatment of bacteremia.  Currently febrile.  Started on cefazolin.  ID consulted.  Plan for TEE.  Patient has high chance  of signing  AMA anytime  Assessment & Plan:  Principal Problem:   MSSA bacteremia Active Problems:   Abscess of left lower extremity   MSSA bacteremia/right lower extremity abscess: Presented initially on 11/12 for complaint of in insect bite on his left knee.  Found to have erythema, swelling and abscess on the medial side of the right knee.  It was drained and he was sent home on oral antibiotics but he was recalled after blood culture sent from 11/12 showed MSSA.  Patient came to the emergency department but signed AMA.  He then presented again. Currently he is afebrile.  No leukocytosis.  Started on cefazolin as per previous blood culture report.  Repeat blood cultures have been sent.  ID consulted, he needs TEE to rule out endocarditis.  History of substance abuse. Last IV drug use with methamphetamine just few days ago Abscess around the left knee appears dry and healing well.  Thoracic MRI did not show any finding of discitis or osteomyelitis.  Substance abuse disorder: History of IV drug use.  Takes marijuana, methamphetamine.  Bipolar disorder: Currently not on medications.  We recommend outpatient follow-up with  psychiatry  Smoking: Nicotine patch ordered.        DVT prophylaxis:enoxaparin (LOVENOX) injection 40 mg Start: 08/06/23 1900     Code Status: Full Code  Family Communication: None at the bedside  Patient status:Inpatient  Patient is from :home  Anticipated discharge [place]:YWVP  Estimated DC date:after full work up   Consultants: ID  Procedures:None  Antimicrobials:  Anti-infectives (From admission, onward)    Start     Dose/Rate Route Frequency Ordered Stop   08/06/23 2200  ceFAZolin (ANCEF) IVPB 2g/100 mL premix        2 g 200 mL/hr over 30 Minutes Intravenous Every 8 hours 08/06/23 2049     08/06/23 1715  ceFAZolin (ANCEF) IVPB 2g/100 mL premix  Status:  Discontinued        2 g 200 mL/hr over 30 Minutes Intravenous  Once 08/06/23 1701 08/06/23 2049       Subjective: Patient seen and examined the bedside today.  Hemodynamically stable.  Comfortably sleeping.  Talks without opening his eyes.  Denies any complaints.  He declined TEE this morning but has agreed for tomorrow morning.  Objective: Vitals:   08/07/23 0735 08/07/23 1538 08/07/23 2123 08/08/23 0447  BP: 121/80 120/70 116/85 119/82  Pulse: 80 73 93 95  Resp: 16 16 18 18   Temp: 97.7 F (36.5 C) 97.6 F (36.4 C) 97.8 F (36.6 C) 98 F (36.7 C)  TempSrc: Oral Oral Oral Oral  SpO2: 100% 100% 100% 97%  Weight:      Height:        Intake/Output Summary (Last 24 hours)  at 08/08/2023 1101 Last data filed at 08/07/2023 1531 Gross per 24 hour  Intake 100 ml  Output --  Net 100 ml   Filed Weights   08/06/23 1543  Weight: 74.8 kg    Examination:   General exam: Overall comfortable, not in distress, appears thin, largest HEENT: PERRL Respiratory system:  no wheezes or crackles  Cardiovascular system: S1 & S2 heard, RRR.  Gastrointestinal system: Abdomen is nondistended, soft and nontender. Central nervous system: Alert and oriented Extremities: No edema, no clubbing ,no cyanosis, healing  abscess area around the medial right knee Skin: No rashes, no ulcers,no icterus     Data Reviewed: I have personally reviewed following labs and imaging studies  CBC: Recent Labs  Lab 08/02/23 2330 08/05/23 1035 08/06/23 1555 08/07/23 0634  WBC 11.6* 5.7 6.7 4.9  NEUTROABS 8.7* 3.8 3.8  --   HGB 12.7* 12.1* 12.4* 12.7*  HCT 38.7* 36.5* 37.5* 38.3*  MCV 91.3 90.8 89.3 89.7  PLT 254 272 313 284   Basic Metabolic Panel: Recent Labs  Lab 08/02/23 2330 08/05/23 1035 08/06/23 1555 08/07/23 0634  NA 138 135 138 137  K 3.9 3.5 3.9 3.9  CL 105 102 101 102  CO2 25 27 28 29   GLUCOSE 107* 126* 83 123*  BUN 20 19 10 17   CREATININE 0.85 0.83 0.92 0.90  CALCIUM 8.9 8.6* 8.9 9.1     Recent Results (from the past 240 hour(s))  Culture, blood (Routine x 2)     Status: Abnormal   Collection Time: 08/02/23 11:30 PM   Specimen: BLOOD  Result Value Ref Range Status   Specimen Description   Final    BLOOD BLOOD LEFT ARM Performed at [facility], [address], [place], [place] [address]    Special Requests   Final    BOTTLES DRAWN AEROBIC ONLY Blood Culture results may not be optimal due to an inadequate volume of blood received in culture bottles Performed at [facility], [address] [place], [place] [address]    Culture  Setup Time   Final    GRAM POSITIVE COCCI IN CLUSTERS AEROBIC BOTTLE ONLY CRITICAL RESULT CALLED TO, READ BACK BY AND VERIFIED WITH: NVivia Ewing RN 08/03/23 @ 2130 BY AB Performed at [facility], [address] [place], [place] [address]    Culture STAPHYLOCOCCUS AUREUS (A)  Final   Report Status 08/05/2023 FINAL  Final   Organism ID, Bacteria STAPHYLOCOCCUS AUREUS  Final      Susceptibility   Staphylococcus aureus - MIC*    CIPROFLOXACIN <=0.5 SENSITIVE Sensitive     ERYTHROMYCIN <=0.25 SENSITIVE Sensitive     GENTAMICIN <=0.5 SENSITIVE Sensitive     OXACILLIN 0.5 SENSITIVE Sensitive     TETRACYCLINE  <=1 SENSITIVE Sensitive     VANCOMYCIN 1 SENSITIVE Sensitive     TRIMETH/SULFA <=10 SENSITIVE Sensitive     CLINDAMYCIN <=0.25 SENSITIVE Sensitive     RIFAMPIN <=0.5 SENSITIVE Sensitive     Inducible Clindamycin NEGATIVE Sensitive     LINEZOLID 2 SENSITIVE Sensitive     * STAPHYLOCOCCUS AUREUS  Blood Culture ID Panel (Reflexed)     Status: Abnormal   Collection Time: 08/02/23 11:30 PM  Result Value Ref Range Status   Enterococcus faecalis NOT DETECTED NOT DETECTED Final   Enterococcus Faecium NOT DETECTED NOT DETECTED Final   Listeria monocytogenes NOT DETECTED NOT DETECTED Final   Staphylococcus species DETECTED (A) NOT DETECTED Final  Comment: CRITICAL RESULT CALLED TO, READ BACK BY AND VERIFIED WITH: N. WHEATLEY RN 08/03/23 @ 2130 BY AB    Staphylococcus aureus (BCID) DETECTED (A) NOT DETECTED Final    Comment: CRITICAL RESULT CALLED TO, READ BACK BY AND VERIFIED WITH: N. WHEATLEY RN 08/03/23 @ 2130 BY AB    Staphylococcus epidermidis NOT DETECTED NOT DETECTED Final   Staphylococcus lugdunensis NOT DETECTED NOT DETECTED Final   Streptococcus species NOT DETECTED NOT DETECTED Final   Streptococcus agalactiae NOT DETECTED NOT DETECTED Final   Streptococcus pneumoniae NOT DETECTED NOT DETECTED Final   Streptococcus pyogenes NOT DETECTED NOT DETECTED Final   A.calcoaceticus-baumannii NOT DETECTED NOT DETECTED Final   Bacteroides fragilis NOT DETECTED NOT DETECTED Final   Enterobacterales NOT DETECTED NOT DETECTED Final   Enterobacter cloacae complex NOT DETECTED NOT DETECTED Final   Escherichia coli NOT DETECTED NOT DETECTED Final   Klebsiella aerogenes NOT DETECTED NOT DETECTED Final   Klebsiella oxytoca NOT DETECTED NOT DETECTED Final   Klebsiella pneumoniae NOT DETECTED NOT DETECTED Final   Proteus species NOT DETECTED NOT DETECTED Final   Salmonella species NOT DETECTED NOT DETECTED Final   Serratia marcescens NOT DETECTED NOT DETECTED Final   Haemophilus influenzae NOT  DETECTED NOT DETECTED Final   Neisseria meningitidis NOT DETECTED NOT DETECTED Final   Pseudomonas aeruginosa NOT DETECTED NOT DETECTED Final   Stenotrophomonas maltophilia NOT DETECTED NOT DETECTED Final   Candida albicans NOT DETECTED NOT DETECTED Final   Candida auris NOT DETECTED NOT DETECTED Final   Candida glabrata NOT DETECTED NOT DETECTED Final   Candida krusei NOT DETECTED NOT DETECTED Final   Candida parapsilosis NOT DETECTED NOT DETECTED Final   Candida tropicalis NOT DETECTED NOT DETECTED Final   Cryptococcus neoformans/gattii NOT DETECTED NOT DETECTED Final   Meth resistant mecA/C and MREJ NOT DETECTED NOT DETECTED Final    Comment: Performed at [facility], [address], [place], [place] [address]  Resp panel by RT-PCR (RSV, Flu A&B, Covid) Anterior Nasal Swab     Status: None   Collection Time: 08/05/23  9:49 AM   Specimen: Anterior Nasal Swab  Result Value Ref Range Status   SARS Coronavirus 2 by RT PCR NEGATIVE NEGATIVE Final    Comment: (NOTE) SARS-CoV-2 target nucleic acids are NOT DETECTED.  The SARS-CoV-2 RNA is generally detectable in upper respiratory specimens during the acute phase of infection. The lowest concentration of SARS-CoV-2 viral copies this assay can detect is 138 copies/mL. A negative result does not preclude SARS-Cov-2 infection and should not be used as the sole basis for treatment or other patient management decisions. A negative result may occur with  improper specimen collection/handling, submission of specimen other than nasopharyngeal swab, presence of viral mutation(s) within the areas targeted by this assay, and inadequate number of viral copies(<138 copies/mL). A negative result must be combined with clinical observations, patient history, and epidemiological information. The expected result is Negative.  Fact Sheet for Patients:  BloggerCourse.com  Fact Sheet for Healthcare Providers:   SeriousBroker.it  This test is no t yet approved or cleared by the [place] FDA and  has been authorized for detection and/or diagnosis of SARS-CoV-2 by FDA under an Emergency Use Authorization (EUA). This EUA will remain  in effect (meaning this test can be used) for the duration of the COVID-19 declaration under Section 564(b)(1) of the Act, 21 U.S.C.section 360bbb-3(b)(1), unless the authorization is terminated  or revoked sooner.       Influenza A  by PCR NEGATIVE NEGATIVE Final   Influenza B by PCR NEGATIVE NEGATIVE Final    Comment: (NOTE) The Xpert Xpress SARS-CoV-2/FLU/RSV plus assay is intended as an aid in the diagnosis of influenza from Nasopharyngeal swab specimens and should not be used as a sole basis for treatment. Nasal washings and aspirates are unacceptable for Xpert Xpress SARS-CoV-2/FLU/RSV testing.  Fact Sheet for Patients: BloggerCourse.com  Fact Sheet for Healthcare Providers: SeriousBroker.it  This test is not yet approved or cleared by the [place] FDA and has been authorized for detection and/or diagnosis of SARS-CoV-2 by FDA under an Emergency Use Authorization (EUA). This EUA will remain in effect (meaning this test can be used) for the duration of the COVID-19 declaration under Section 564(b)(1) of the Act, 21 U.S.C. section 360bbb-3(b)(1), unless the authorization is terminated or revoked.     Resp Syncytial Virus by PCR NEGATIVE NEGATIVE Final    Comment: (NOTE) Fact Sheet for Patients: BloggerCourse.com  Fact Sheet for Healthcare Providers: SeriousBroker.it  This test is not yet approved or cleared by the [place] FDA and has been authorized for detection and/or diagnosis of SARS-CoV-2 by FDA under an Emergency Use Authorization (EUA). This EUA will remain in effect (meaning this test can be used) for  the duration of the COVID-19 declaration under Section 564(b)(1) of the Act, 21 U.S.C. section 360bbb-3(b)(1), unless the authorization is terminated or revoked.  Performed at [facility], [address], [place], [place] [address]   Blood Culture (routine x 2)     Status: None (Preliminary result)   Collection Time: 08/05/23 10:30 AM   Specimen: BLOOD  Result Value Ref Range Status   Specimen Description   Final    BLOOD SITE NOT SPECIFIED Performed at [facility], [address], [place], [place] [address]    Special Requests   Final    BOTTLES DRAWN AEROBIC AND ANAEROBIC Blood Culture results may not be optimal due to an excessive volume of blood received in culture bottles Performed at [facility], [address] [place], [place] [address]    Culture   Final    NO GROWTH 3 DAYS Performed at [facility], [address] [place], [place] [address]    Report Status PENDING  Incomplete  Blood Culture (routine x 2)     Status: None (Preliminary result)   Collection Time: 08/05/23 10:30 AM   Specimen: BLOOD  Result Value Ref Range Status   Specimen Description   Final    BLOOD SITE NOT SPECIFIED Performed at [facility], [address], [place], [place] [address]    Special Requests   Final    BOTTLES DRAWN AEROBIC AND ANAEROBIC Blood Culture results may not be optimal due to an excessive volume of blood received in culture bottles Performed at [facility], [address] [place], [place] [address]    Culture   Final    NO GROWTH 3 DAYS Performed at [facility], [address], [place], [place] [address]    Report Status PENDING  Incomplete     Radiology Studies: MR THORACIC SPINE W WO CONTRAST  Result Date: 08/07/2023 CLINICAL DATA:  Bacteremia.  Midline tenderness from T10-T12. EXAM: MRI THORACIC WITHOUT AND WITH CONTRAST TECHNIQUE: Multiplanar and  multiecho pulse sequences of the thoracic spine were obtained without and with intravenous contrast. CONTRAST:  7mL GADAVIST GADOBUTROL 1 MMOL/ML IV SOLN COMPARISON:  None Available. FINDINGS: Precontrast  axial T1 images were inadvertently not obtained through the upper thoracic spine. Alignment: Normal. Vertebrae: No fracture, suspicious marrow lesion, significant marrow edema, or evidence of discitis/osteomyelitis. Cord: Normal cord signal and morphology. No abnormal intradural enhancement. Paraspinal and other soft tissues: No paraspinal or epidural fluid collection. Disc levels: Disc bulging and asymmetric right uncovertebral spurring at C6-7 resulting in mild spinal stenosis and mild-to-moderate right neural foraminal stenosis. Small central disc protrusions at T5-6 and T6-7 without stenosis. Mild lower thoracic facet hypertrophy. IMPRESSION: 1. No evidence of thoracic spine infection. 2. Mild thoracic disc degeneration without stenosis. 3. Mild spinal stenosis and mild-to-moderate right neural foraminal stenosis at C6-7. Electronically Signed   By: Sebastian Ache M.D.   On: 08/07/2023 18:29    Scheduled Meds:  enoxaparin (LOVENOX) injection  40 mg Subcutaneous Q24H   Continuous Infusions:   ceFAZolin (ANCEF) IV 2 g (08/08/23 0602)     LOS: 2 days   Burnadette Pop, MD [facility] P11/18/2024, 11:01 AM

## 2023-08-08 NOTE — Progress Notes (Signed)
VASCULAR LAB    Left lower extremity venous duplex has been performed.  See CV proc for preliminary results.   Miranda Frese, RVT 08/08/2023, 11:30 AM

## 2023-08-08 NOTE — Progress Notes (Signed)
Patient refused to go to TEE procedure. Charge nurse attempted to encourage patient to go to procedure but patient refused. Patient kept quiet and did not want to be bothered. Notified physician.

## 2023-08-08 NOTE — Plan of Care (Signed)

## 2023-08-08 NOTE — Progress Notes (Signed)
[facility]  Date of Admission:  08/06/2023   Total days of inpatient antibiotics 3  Principal Problem:   MSSA bacteremia Active Problems:   Abscess of left lower extremity          Assessment: 42 year old male with history of lumbar fusion and no hardware per report IVDA, bipolar disorder male with: #MSSA bacteremia Likely 2/2 left knee infection #IVDA - Initially left AMA on 11/12 and readmitted with positive blood cultures on 11/15.  Repeat blood culture 11/15 no growth so far - TTE and TEE scheduled.  Patient ate this morning as/noted that he declined TEE. - He had reported some low back pain, MRI T-spine negative for infection. - Source likely left knee as there is an abscess with associated cellulitis. Only small knee joint effusion on [place].   Recommendations: -Continue cefazolin - Patient amenable to repeat TEE tomorrow - Follow repeat blood cultures on 11/15 - Given IVDA patient is not a candidate for PICC line for home antibiotics.   Microbiology:   Antibiotics: Cefazolin 11/15-present Cultures: Blood 11/12 1/1 MSSA 11/15 no growth 6x3 Urine  Other   SUBJECTIVE: Resting in bed.  Reports he ate this morning that is why declined TEE. Interval: Afebrile overnight.  WBC 4.9K.  Review of Systems: Review of Systems  All other systems reviewed and are negative.    Scheduled Meds:  enoxaparin (LOVENOX) injection  40 mg Subcutaneous Q24H   Continuous Infusions:   ceFAZolin (ANCEF) IV 2 g (08/08/23 0602)   PRN Meds:.acetaminophen **OR** acetaminophen, ondansetron **OR** ondansetron (ZOFRAN) IV, senna-docusate Allergies  Allergen Reactions   Augmentin [Amoxicillin-Pot Clavulanate] Hives    Has patient had a PCN reaction causing immediate rash, facial/tongue/throat swelling, SOB or lightheadedness with hypotension: Yes Has patient had a PCN reaction causing severe rash involving mucus membranes or skin necrosis: No Has  patient had a PCN reaction that required hospitalization: No Has patient had a PCN reaction occurring within the last 10 years: No If all of the above answers are "NO", then may proceed with Cephalosporin use.     OBJECTIVE: Vitals:   08/07/23 0735 08/07/23 1538 08/07/23 2123 08/08/23 0447  BP: 121/80 120/70 116/85 119/82  Pulse: 80 73 93 95  Resp: 16 16 18 18   Temp: 97.7 F (36.5 C) 97.6 F (36.4 C) 97.8 F (36.6 C) 98 F (36.7 C)  TempSrc: Oral Oral Oral Oral  SpO2: 100% 100% 100% 97%  Weight:      Height:       Body mass index is 23 kg/m.  Physical Exam Constitutional:      General: He is not in acute distress.    Appearance: He is normal weight. He is not toxic-appearing.  HENT:     Head: Normocephalic and atraumatic.     Right Ear: External ear normal.     Left Ear: External ear normal.     Nose: No congestion or rhinorrhea.     Mouth/Throat:     Mouth: Mucous membranes are moist.     Pharynx: Oropharynx is clear.  Eyes:     Extraocular Movements: Extraocular movements intact.     Conjunctiva/sclera: Conjunctivae normal.     Pupils: Pupils are equal, round, and reactive to light.  Cardiovascular:     Rate and Rhythm: Normal rate and regular rhythm.     Heart sounds: No murmur heard.    No friction rub. No gallop.  Pulmonary:  Effort: Pulmonary effort is normal.     Breath sounds: Normal breath sounds.  Abdominal:     General: Abdomen is flat. Bowel sounds are normal.     Palpations: Abdomen is soft.  Musculoskeletal:        General: No swelling. Normal range of motion.     Cervical back: Normal range of motion and neck supple.  Skin:    General: Skin is warm and dry.  Neurological:     General: No focal deficit present.     Mental Status: He is oriented to person, place, and time.  Psychiatric:        Mood and Affect: Mood normal.      Lab Results Lab Results  Component Value Date   WBC 4.9 08/07/2023   HGB 12.7 (L) 08/07/2023   HCT 38.3  (L) 08/07/2023   MCV 89.7 08/07/2023   PLT 284 08/07/2023    Lab Results  Component Value Date   CREATININE 0.90 08/07/2023   BUN 17 08/07/2023   NA 137 08/07/2023   K 3.9 08/07/2023   CL 102 08/07/2023   CO2 29 08/07/2023    Lab Results  Component Value Date   ALT 20 08/06/2023   AST 29 08/06/2023   ALKPHOS 58 08/06/2023   BILITOT 0.4 08/06/2023        Danelle Earthly, MD [facility] 08/08/2023, 1:03 PM  I have personally spent 52 minutes involved in face-to-face and non-face-to-face activities for this patient on the day of the visit. Professional time spent includes the following activities: Preparing to see the patient (review of tests), Obtaining and/or reviewing separately obtained history (admission/discharge record), Performing a medically appropriate examination and/or evaluation , Ordering medications/tests/procedures, referring and communicating with other health care professionals, Documenting clinical information in the EMR, Independently interpreting results (not separately reported), Communicating results to the patient/family/caregiver, Counseling and educating the patient/family/caregiver and Care coordination (not separately reported).

## 2023-08-08 NOTE — Progress Notes (Addendum)
Received update that patient refused TEE but per IM may be willing to pursue tomorrow. IM resumed diet and made NPO after midnight. I sent msg to AM cardmaster inbox to touch base with IM team in AM about whether patient willing to proceed. Tentatively scheduled with Dr. Jens Som at 9:45pm tomorrow.

## 2023-08-09 ENCOUNTER — Inpatient Hospital Stay (HOSPITAL_COMMUNITY): Payer: BLUE CROSS/BLUE SHIELD | Admitting: Certified Registered Nurse Anesthetist

## 2023-08-09 ENCOUNTER — Encounter (HOSPITAL_COMMUNITY): Payer: Self-pay | Admitting: Student

## 2023-08-09 ENCOUNTER — Encounter (HOSPITAL_COMMUNITY)
Admission: EM | Disposition: A | Discharge status: Home / Self Care | Payer: Self-pay | Source: Home / Self Care | Attending: Internal Medicine

## 2023-08-09 ENCOUNTER — Inpatient Hospital Stay (HOSPITAL_COMMUNITY): Payer: BLUE CROSS/BLUE SHIELD

## 2023-08-09 DIAGNOSIS — L03116 Cellulitis of left lower limb: Secondary | ICD-10-CM

## 2023-08-09 DIAGNOSIS — R7881 Bacteremia: Secondary | ICD-10-CM | POA: Diagnosis not present

## 2023-08-09 DIAGNOSIS — B9561 Methicillin susceptible Staphylococcus aureus infection as the cause of diseases classified elsewhere: Secondary | ICD-10-CM | POA: Diagnosis not present

## 2023-08-09 HISTORY — PX: TRANSESOPHAGEAL ECHOCARDIOGRAM (CATH LAB): EP1270

## 2023-08-09 LAB — ECHO TEE

## 2023-08-09 SURGERY — TRANSESOPHAGEAL ECHOCARDIOGRAM (TEE) (CATHLAB)
Anesthesia: Monitor Anesthesia Care

## 2023-08-09 MED ORDER — PROPOFOL 10 MG/ML IV BOLUS
INTRAVENOUS | Status: DC | PRN
  Administered 2023-08-09 (×5): 20 mg via INTRAVENOUS

## 2023-08-09 MED ORDER — LIDOCAINE 2% (20 MG/ML) 5 ML SYRINGE
INTRAMUSCULAR | Status: DC | PRN
  Administered 2023-08-09: 80 mg via INTRAVENOUS

## 2023-08-09 MED ORDER — SODIUM CHLORIDE 0.9 % IV SOLN: INTRAVENOUS | Status: DC | PRN

## 2023-08-09 NOTE — Interval H&P Note (Signed)
History and Physical Interval Note:  08/09/2023 7:59 AM  Trevor [name]  has presented today for surgery, with the diagnosis of bactermeia.  The various methods of treatment have been discussed with the patient and family. After consideration of risks, benefits and other options for treatment, the patient has consented to  Procedure(s): TRANSESOPHAGEAL ECHOCARDIOGRAM (N/A) as a surgical intervention.  The patient's history has been reviewed, patient examined, no change in status, stable for surgery.  I have reviewed the patient's chart and labs.  Questions were answered to the patient's satisfaction.     Olga Millers

## 2023-08-09 NOTE — Plan of Care (Signed)

## 2023-08-09 NOTE — Progress Notes (Signed)
     Transesophageal Echocardiogram Note  Trevor [name] [id number] 1980/11/26  Procedure: Transesophageal Echocardiogram Indications: Bacteremia  Procedure Details Consent: Obtained Time Out: Verified patient identification, verified procedure, site/side was marked, verified correct patient position, special equipment/implants available, Radiology Safety Procedures followed,  medications/allergies/relevent history reviewed, required imaging and test results available.  Performed  Medications:  Pt sedated by anesthesia with diprovan 100 mg IV total.  Normal LV function; no vegetations.   Complications: No apparent complications Patient did tolerate procedure well.  Olga Millers, MD

## 2023-08-09 NOTE — Discharge Instructions (Signed)
Intensive Outpatient Programs  [facility]    [facility] [address]     [address] [place],  [place]     [place], [place] [phone]      [phone]  [name]   [facility]  (Inpatient and outpatient)  [phone] (Suboxone and Methadone) [address]           [phone]           ADS: Alcohol & Drug Services    [facility] - Intensive Outpatient [address]     [address] [place], [place] [address]     [place], [place]  [phone]      [phone]  [facility]o [facility]ye (Outpatient, Inpatient, Chemical  [facility]aring Services (Groups and Residental) (insurance only) [phone]    [place], [place]          [phone]       [facility]    [facility] (for caregivers and family) [address]     [address] [place], [place]     [place], [place] [phone]      [phone]  Residential Treatment Programs  [facility]  Work Farm(2 years) Residential: 90 days)  [facility] ([facility] [address] [facility] [place]      [address] [place], [place]     [place], [place] [phone]      [phone] or [phone]  [facility]    [facility] [address]      [address] [place], [place]     [place], [place] [phone]      [phone]  [facility]   [facility] Services (RTS) [address]     [address] [place], [place] [address]     [place], [place] [phone]      [phone] Admissions: 8am-3pm M-F  BATS Program: Residential Program [phone] Days)              ADATC: [facility]  [place], [place]     [place], [place]  [phone] or [phone]    (Walk in Hours over the weekend or by referral)   Mobil Crisis: [facility]atives:[phone] (for crisis  response 24 hours a day)   Sunday, by [facility] [address] [place], [address], [phone], 3.2 mi from [facility], call in advance for appointment at 10:00am or at 4:00pm, must provide valid photo ID  Monday  9:30am-5:00pm [facility], [address] [place] [phone], [phone], 0.9 mi from [facility], can come four times per year, bring your photo ID and SS cards for other residents of household, will make appointments for those who work and need to come after 5pm  10:00am-12:00noon St [employer], [address]  [place], [address], [phone], 1.7 mi from [facility], can come once every 60 days per household, need referral from DSS, [employer] Global, etc., bring photo ID and SS card   10:00am-1:00pm Avie Echevaria the [facility],  [address] [place], [address], [phone], 7.7 mi from [facility], can come once every thirty days with a referral from DSS, [employer], Mental Health, etc. -- each referral  good for six visits, bring photo ID   10:00am-1:00pm [address], [address] [address], [phone], 4.2 mi from [facility], can come once every 6 months, open to [facility] residents, bring photo ID and copy of a current utility bill in your name, please call first to verify that food is available  6:30pm-8:30pm [facility], [address] [address], [phone], 3.2 mi from [facility], can come once every 30 days, maximum 6 times per year, bring your photo ID and SS numbers for other residents of household  Monday by APPOINTMENT ONLY  [facility], [address], [address],  [phone], 2.5 mi from [facility], call in advance for appointment between 10:00am-2:00pm, bring your photo ID and SS cards for all residents of household, can come once every 3 months  One Step Further, [address], [address],  [phone], 0.7 mi from [facility], call in advance for appointment, can come once every 30 days, bring your photo ID and SS cards for other residents of household   Tuesday  9:00am-12:00noon [employer], [address], [address], [phone], 1.3 mi from [facility], can come once every 3 months, bring your photo ID and SS numbers for other residents of household   9:00am-1:00pm [facility] [address]  [place], [address], [phone], 1.6 mi from [facility], can come once every two weeks  9:30am-5:00pm [employer], [address] [place] [phone], [phone], 0.9 mi from [facility], can come four times per year, bring your photo ID and SS cards for other residents of household, will make appointments for those who work and need to come after 5pm  10:00am-12:00noon [employer], [address]  [place], [address], [phone], 1.7 mi from [facility], can come once every 60 days per household, need referral from DSS, [employer], etc., bring photo ID and SS card  10:00am-1:00pm [employer], [address] [place], [id number],  [phone], 3.8 mi from [facility], with referral from DSS, may come six times, 30 days apart, bring your photo ID and SS cards for all residents of household   10:00am-1:00pm [address] the [facility], [address] [place], [address], [phone], 7.7 mi from [facility], can come once every thirty days with a referral from DSS, [employer], Mental Health, etc.- each referral good for six visits, bring photo ID   10:00am-1:00pm [address] [address], [phone], 4.2 mi from [facility], can come once every 6 months, open to [facility] residents, bring photo ID and copy of a current utility bill in your name, please call first to verify that food is available  2:00pm-3:30pm [facility], [address], [id number], [phone], 3.7 mi from [facility], can come twelve times per year, one bag per family, bring photo ID   FIRST AND THIRD Tuesdays  10:00am-1:00pm, [facility], [address] [place] [address], [address], [phone], 7.2 mi from [facility], can come once every 30 days   Tuesday, WHEN FOOD IS AVAILABLE (call)  12:00noon-2:00pm [facility], [address], [address], [phone], 1.5 mi from  [facility], can come once every 30 days, bring photo ID   Tuesday,  by APPOINTMENT ONLY  [facility], [address], [address],  [phone], 2.5 mi from [facility], call in advance for appointment between 1:00pm-4:00pm, bring your photo ID and SS cards for all residents of household, can come once every 3 months   [address] of [address], [address] [address], [phone], 5 mi from [facility], call one day ahead for appointment the next day between 10:00am and 12:00noon, can come once every 3 months, bring your photo ID and must qualify according to family income   [facility] Further, [address], [address], [phone], 0.7 mi from [facility], call in advance for appointment, can come once every 30 days, bring your photo ID and SS cards for other residents of household   [address] of [place], [address], [address],  [phone], 5.1 mi from [facility], call between 9:00am and 1:00pm M-F to make appointment. Appointments are scheduled for Tues and Thurs from 10:00am-11:30am, bring photo ID, can come once every 6 months, limit three visits over 18 months, then must have referral  Wednesday  9:30am-5:00pm [facility], [address] [place] [phone], [phone], 0.9 mi from [facility], can come four times per year, bring your photo ID and SS cards for other residents of household, will make appointments for those  who work and need to come after 5pm  9:30am-11:30am [employer] [facility], [address], [address], [phone], 6.6 mi from [facility], can come once every 30 days, bring your photo ID, and SS cards for other residents of household, each monthly visit requires a written referral from GUM or DSS with number in household on form  10:00am-12:00noon [address]  [place], [address], [phone], 1.7 mi from [facility], can come once every 60 days per household, need referral from DSS, [employer], etc., bring photo ID and SS card  10:00am-1:00pm [facility], [id number],  [phone], 3.9 mi from [facility], with referral from DSS, may come six times, 30 days apart, bring your photo ID and SS cards for all residents of household   10:00am-1:00pm [address] the [facility], [address] [place], [address], [phone], 7.7 mi from [facility], can come once every thirty days with a referral from DSS, [employer], Mental Health, etc. - each referral good for six visits, bring photo ID   2:00pm-5:45pm [address], [address] [place], [address], [phone], 3.2 mi from [facility], once every 30 days, first come/first served, limited to first 25, bring photo ID   6:30pm-8:30pm [facility], [address] [address], [phone], 3.2 mi from [facility], can come once every 30 days, maximum 6 times per year, bring your photo ID and SS numbers for other residents of household  FIRST and THIRD Wednesdays   9:00am-12:00noon, [facility], [address], [address], [phone], 4.2 mi from [facility], can come once a month. Please arrive and sign in no later than 11:15 so everyone can be served by 12 noon.  THIRD Wednesday  1:30pm-3:00pm, [place]. [address] [place], [address], [phone] or [phone], 2.1 mi  from [facility]  Wednesday, by APPOINTMENT ONLY  [address] [facility], [address], [address], [phone],  5 mi from [facility], call one day ahead for appointment the next day between 10:00am and 12:00noon, can come once every 3 months, bring your photo ID and must qualify according to family income   [facility], [address], [address], [phone], 0.7 mi from [facility], call in advance for appointment, can come once every 30 days, bring your photo ID and SS cards for other residents of household   [address] of [place], [address] [place], [address], [phone], 3.2 mi from [facility], call in advance for appointment at 7:00pm, must provide valid photo ID  Thursday  9:00am-12:00noon [employer], [address], [address], [phone], 1.3 mi from [facility], can come once every 3 months, bring your photo ID and SS numbers for other residents of household   9:00am-1:00pm [facility] [address],  [place], [address], [phone]/Ext [phone], 1.6 mi from [facility], can come once every two weeks  9:30am-12:00noon [employer], [address], [address], [phone], 4.5 mi from [facility], can come once every 30 days, must state income [closed Thanksgiving and week of Christmas]  9:30am-5:00pm [employer], [address] [place] [phone], [phone], 0.9 mi from [facility], can come four times per year, bring your photo ID and SS card for other residents of household, will make appointments for those who work and need to come after 5pm  10:00am-1:00pm [employer], [address] [place], [id number],  [phone], 3.9 mi from [facility], with referral from DSS, may come six times, 30 days apart, bring your photo ID and SS cards for all residents of household   10:00am-1:00pm [address] the [facility], [address] [place], [address], [phone], 7.7 mi from  [facility], can come once every thirty days with a referral from DSS, [employer], Mental Health, etc.- each referral good for six visits, bring photo ID   10:00am-1:00pm [facility], [address], [address], [phone], 4.2 mi from [facility], can come once every 6 months, open to [facility] residents, bring photo ID and copy of a current utility bill in your name, please call first to verify that food is available    SUNDAYS BREAKFAST TWO LOCATIONS: 8:00am served in [facility] by Awaken PPL Corporation 8:30am SHUTTLE provided from [facility], served at [employer], [address] LUNCH TWO LOCATIONS [plus one additional third Sunday only] 10:30am - 12:30pm served at [employer], [employer], [place] [address] (1.2 miles from [facility]) 12:30pm served in [place] by Land O'Lakes Team (THIRD Sunday only) 1:30pm served at [facility] by [facility] one location [plus one additional third Sunday only] 5:00pm Every Sunday, served under the bridge at [address] by Lindell Noe Under the 3M Company (.7 miles from [facility]) (THIRD Sunday ONLY) 4:00pm served in the parking garage, across from Nucor Corporation, corner of [address] and [place] by Ryland Group Works Ministries MONDAYS BREAKFAST 7:30am served in [employer] Corporation by the United States [employer] Corporation and Friends LUNCH 10:30am - 12:30pm served at [employer], [employer], [place] [address] (1.2 miles from [facility]) DINNER TWO LOCATIONS: 7:00pm served in front of the courthouse at the corner of Goldman Sachs and International Business Machines. by [facility] Monday Night Meal (  3 blocks from [facility]) 4:30pm served at the [employer], [address] by The Procter & Gamble Not Bombs (0.6 miles from [facility]) [place] BREAKFAST 8:00am - 9:00am served at The TJX Companies, [address] (0.3 miles from [place]) LUNCH 10:30am - 12:30pm served at the E[employer], [employer] [address] (1.2 miles from [place]) DINNER 6:00pm served at CSX Corporation, enter from Capital One and go to the Sonic Automotive, (0.7 miles from [place]) [facility] BREAKFAST 7:00am - 8:00am served at [employer], [employer] [address] (1.2 miles from [place]) LUNCH ONE LOCATION [plus two additional locations listed below] 10:30am - 12:30pm served at [employer], [employer] [address] (1.2 miles from [place]) (FIRST Wednesday ONLY) 11:30am served at Dillard's, [place] [address] (6.6 miles from [place]) (SECOND Wednesday ONLY) 11:00am served at [place]. Melvyn Novas of [address], [address] (1.3 miles from [place]) [place] TWO LOCATIONS 6:00pm served at W. R. Berkley, [place] W. Visteon Corporation. (1.3 miles from [facility]) 4:00pm - 6:00pm (hot dogs and chips) served at Levi Strauss of [facility], [address] (1.7 miles from [place]) [place] BREAKFAST NOT AVAILABLE AT THIS TIME LUNCH 10:30am - 12:30pm served at Ecol[employer], [employer], [place] [address] (1.2 miles from [place]) DINNER 6:00pm served at CSX Corporation, enter from Capital One and go to the Sonic Automotive, (0.7 miles from Nucor Corporation) [place] BREAKFAST NOT AVAILABLE AT THIS TIME LUNCH 10:30am - 12:30pm served at [employer], [employer] [address], (1.2 miles from [place]) DINNER TWO LOCATIONS, [plus one additional first Friday only] 6:00pm served under the bridge at [address] by Lindell Noe Under CSX Corporation. (.7 miles from [facility]) 5:00pm - 7:00pm served at Levi Strauss of  [facility], [address] (1.7 miles from [place]) (FIRST Friday ONLY) 5:45 pm - SHUTTLE provided from the LIBRARY at 5:45pm. Served at [facility], [address]. SATURDAYS BREAKFAST TWO LOCATIONS [plus one additional last Saturday only] 8:00am served at [facility] by Delphi 8:30am served at [employer] Homes, [address] [place] [address]. (2.2 miles from [facility]) (LAST Saturday ONLY) 8:30am served at Beazer Homes, [address] (5 miles from [place]) LUNCH 10:30am - 12:30pm served at Ecolab, Liberty Global [address] (1.2 miles from [facility]) DINNER 6:00pm served under the bridge at [address] by World Fuel Services Corporation (0.7 miles from Nucor Corporation)  DIRECTIONS FROM [place] TO ALL MEAL LOCATIONS The Bridge at [address] (.7 miles from [address]) [address] on [place]. Turn Right onto DIRECTV 433 ft. Continue onto [address] under bridge, about 500 ft. Courthouse (3 blocks from [facility]) [place] on [address]. Turn right on [place] 1 block to PPL Corporation (.5 miles from [place]) [place] on [place]. YRC Worldwide. past Brink's Company to EMCOR. Enter from Capital One and go to the Affiliated Computer Services building W. R. Berkley [address] Visteon Corporation. (1.3 miles from [facility]) [place] on [place]  St. Turn Right onto [address] church will be on the Left. The TJX Companies [address] (.3 miles from [facility]) Go .3 miles on W. Friend[address] Destination is on your right Dillard's at ONEOK (6.6 miles from [address]) [address] on [place] toward W Friend[address] Turn right onto [address] Friend[address] Continue onto Alcoa Inc. Continue onto Toll Brothers. 5. Elesa Hacker is on right [facility] On [address] Conseco) [address]  [address] (.6 miles from [address]) [place] on [place]. [address] Turn Left onto [address] 0.3 miles Destination is on the Left. [place] Black & Decker at American Express (5 miles from Nucor Corporation) 1. Head south on [address]. Turn right onto [address] Friend[address] Turn slightly left onto Quest Diagnostics Continue onto Quest Diagnostics Turn right at Barnes & Noble Continue to church on right New Birth Sounds of [facility] [address] (1.7 miles from [place]) [address] on [place] 1.4 miles [place] becomes [place]. El[address] Continue 0.6 miles and church will be on theright. Northside Guardian Life Insurance at [address] (2.5 miles from Nucor Corporation) [place] provided from Massachusetts Mutual Life Park] [place] on [place]. Elm toward Estée Lauder right onto Costco Wholesale left onto Henry Schein left onto Micron Technology [address] Southern Company (2.2 miles from [address]) [address] on [place] 1.4 miles [place] becomes [place]. Elm [address] Turn right onto [address]. [address] and church will be on the Left. Potter's House/[place] AT&T [address] (1.2 miles from [facility]) 1.Turn right onto [facility] 2.Turn left onto Rogue Jury 3.Reino [place] 4.Destination is on your right [place]. Melvyn Novas of [address] at ToysRus (1.3 miles from [facility]) [address] on [address] Turn left onto Genuine Parts right onto S. [place]. Continue onto KB Home	[place]. Turn left onto Smurfit-Stone Container. Turn right onto WellPoint.   [facility] assistance programs. If you are behind on your bills and expenses, and need some help to make it through a short term hardship or financial emergency, there are several organizations and charities in the [place] area that may be able to help. They range from the [employer], Liberty Global, Landscape architect of Weyerhaeuser Company and the local community  action agency, the Intel, Avnet. These groups may be able to provide you resources to help pay your utility bills, rent, and they even offer housing assistance.  Crisis assistance program Find help for paying your rent, electric bills, free food, and even funds to pay your mortgage. The [employer] Global [phone]) offers several services to local families, as funding allows. The Emergency Assistance Program (EAP), which they administer, provides household goods, free food, clothing, and financial aid to people in need in the [facility] area. The EAP program does have some qualification, and counselors will interview clients for financial assistance by written referral only. Referrals need to be made by the Department of Social Services or by other EAP approved human services agencies or charities in the area.  Money for resources for emergency assistance are available for security deposits for rent, water, electric, and gas, past due rent, utility bills, past due mortgage payments, food, and clothing. The [employer] also operates a Programme researcher, broadcasting/film/video  on the site. More Liberty Global.  Open Door Ministries of Colgate[employer]Pal[employer]ive, which can be reached at [phone], offers emergency assistance programs for those in need of help, such as food, rent assistance, a soup kitchen, shelter, and clothing. They are based in [facility] but provide a number of services to those that qualify for assistance. Continue with Open Door Ministries programs.  [facility] Department of Social Services may be able to offer temporary financial assistance and cash grants for paying rent and utilities. Help may be provided for local county residents who may be experiencing personal crisis when other resources, including government programs, are not available. Call [phone]  St. [place] Society, which is based in [place], provides  financial assistance of up to $50.00 to help pay for rent, utilities, cooling bills, rent, and prescription medications. The program also provides secondhand furniture to those in need. [phone]  Mattel is a Geneticist, molecular. The organization can offer emergency assistance for paying rent, electric bills, utilities, food, household products and furniture. They offer extensive emergency and transitional housing for families, children and single women, and also run a Boy's and Dole Food. 301 Thrift Shops, CMS Energy Corporation, and other aid offered too. [address], [place], [place] [address], [phone]  Additional locations of the [employer] are in [place] and other nearby communities. When you have an emergency, need free food, money for basic needs, or just need assistance around Christmas, then the [employer] may have the resources you need. Or they can refer you to nearby agencies. Learn more.  [place] Low Income Risk manager - This is offered for [facility] families. The federal government created CIT Group Program provides a one-time cash grant payment to help eligible low-income families pay their electric and heating bills. [address] [place], [place] [address], [phone]  Government and Motorola - The county administers several emergency and self-sufficiency programs. Residents of [place] can get help with energy bills and food, rent, and other expenses. In addition, work with a Sports coach who may be able to help you find a job or improve your employment skills. More [place] public assistance.  [place] Emergency Assistance - A program offers emergency utility and rent funds for greater [place] area residents. The program can also provide counseling and referrals to charities and government programs. Also provides food and a free meal program that serves  lunch Mondays - Saturdays and dinner seven days per week to individuals in the community. [address], [place], [place] [address], [phone]  [employer] - Offers affordable apartment and housing communities across [place] and [place]. The low income and seniors can access public housing, rental assistance to qualified applicants, and apply for the section 8 rent subsidy program. Other programs include Chiropractor and Engineer, maintenance. [address] [place], [place] [address], dial [phone].  Basic needs such as clothing - Low income families can receive free items (school supplies, clothes, holiday assistance, etc.) from clothing closets while more moderate income [address] families can shop at [employer]. Locations across the area help the needy. Get information on [place] [facility] free clothing centers.  The [facility] provides transitional housing to veterans and the disabled. Clients will also access other services too, including life skills classes, case management, and assistance in finding permanent housing.  [address], [place], [place] [address], call [phone]  [facility] in [place] is for people who were just evicted or that are formerly homeless. The non-profit will also help then gain self-sufficiency, find a home or apartment to live in, and also provides information on rent assistance when needed. Dial [phone]  [employer] to End Homelessness is available in [place]. Families that were evicted or that are homeless can gain shelter, food, clothing, furniture, and also emergency financial assistance. Other services include financial skills and life skills coaching, job training, and case management. [address] [place], [place] [address]. Telephone [phone].  The [employer]. runs the [employer]. This can help people save money on their heating and summer cooling bills, and is free to low income families. Free upgrades can be made to your home. Phone [phone]  Many of the non-profits and programs mentioned above are all inclusive, meaning they can meet many needs of the low income, such as energy bills, food, rent, and more. However there are several organizations that focus just on rent and housing. Read more on rent assistance in [place] region.  Legal assistance for evictions, foreclosure, and more If you need free legal advice on civili issues, such as foreclosures, evictions, Electronics engineer, government programs, domestic issues and more, Armed forces operational officer Aid of [facility]) is a Associate Professor firm that provides free legal services and counsel to lower income people, seniors, disabled, and others. The goal is to ensure everyone has access to justice and fair representation.  Call them at [phone], or click here to learn more about [place] free legal assistance programs.  [place] [employer] and funds for emergency expenses The [employer] is another organization that can provide people with [employer] and funds to pay bills. Their assistance depends on funding, and the demand for help is always very high. They can provide cash to help pay rent, a missed mortgage payment, or gas, electric, and water bills. But the assistance doesn't stop there. They also have a food pantry on site, which can provide food once every three (3) months to people who need help. The [employer] can also offer a Engineering geologist once every three (3) months for a maximum three (3) times. After receiving this voucher over that period of time, applicants can receive this aid one every six (6) months after that. [phone].  [employer] action agency The [employer], [employer]. offers job and  Dispensing optician. Resources are focused on helping students obtain the skills and experiences that are necessary to compete in today's challenging and tight job market. The non-profit faith-based community action agency offers internship trainings as well as classroom instruction. Economically disadvantaged and challenged individuals and potential employers can use their services. Classes are tailored to meet the needs of people in the [facility] region. [place], [place] [address], [phone]    Foreclosure prevention services Housing Counseling and Education is also offered by [employer] of the [place]. The agency (phone number is below) is a Engineer, structural providing foreclosure advice and counseling. They offer mortgage resolution counseling and also reverse mortgage counseling. Counselors can direct people to both [employer], as well as [employer] foreclosure assistance options.  Warehouse manager has locations in [place] and [place]. They run debt and foreclosure prevention programs for local families. A sampling of the programs  offered include both Budget and Housing Counseling. This includes money management, financial advice, budget review and development of a written action plan with a Pensions consultant to help solve specific individual financial problems. In addition, housing and mortgage counselors can also provide pre- and post-purchase homeownership counseling, default resolution counseling (to prevent foreclosure) and reverse mortgage counseling. A Debt Management Program allows people and families with a high level of credit card or medical debt to consolidate and repay consumer debt and loans to creditors and rebuild positive credit ratings and scores. [phone]  Debt assistance programs Receive free counseling and debt help from [facility] of the [place]. The [facility] based agency can be reached at [phone]. The counselors provide free help, and the services include budget counseling. This will help people manage their expenses and set goals. They also offer a Forensic scientist, which will help individuals consolidate their debts and become debt free. Most of the workshops and services are free.  Community clinics in [place] of the leading health and dental centers are listed below. They may be able to provide medication, physicals, dental care, and general family care to residents of all incomes and backgrounds across the region. Some of the programs focus on the low income and underinsured. However if these clinics can't meet your needs, find information and details on more clinics in [facility].  Some of the options include [employer] of [employer]. This center provides free or low cost health care to low-income adults 18 - 64, who have no health insurance. Among other services offered include a pharmacy and eye clinic. Phone [phone]  [facility], which is located in [place], is a community clinic that provides primary medical and health care to uninsured and underinsured adults and families, as well as the low income, in the greater [place] area on a sliding-fee scale. Call [phone]  [place] [facility] - They run a dental assistance program that is organized by [facility] to provide dental services and aid to [employer]. Services offered by the dental clinic are limited to extractions, pain management, and minor restorative care. [phone]  [facility] has locations in [facility] and [place]. The community clinics provide complete pediatric care including primary health, mental health, social work, neurology, cardiology, asthma. Dial [phone].  In addition to those [address]  and [employer], find other free community clinics in [place] and across the county.  Food pantry and assistance Some of the local food pantries and distribution centers to call for free food and groceries include [facility] [employer] [facility] [place] (phone [phone]), The [facility] (phone [phone]) and also [employer]. Dial [phone].  Several other food banks in the region provide clothing, free food and meals, access to soup kitchens and other help. Find the addresses and phone numbers of more food pantries in [place]. http://[email or web address].[email or web address]/[email or web address]_assistance_pro.[email or web address]

## 2023-08-09 NOTE — Anesthesia Preprocedure Evaluation (Signed)
Anesthesia Evaluation  Patient identified by MRN, date of birth, ID band Patient awake    Reviewed: Allergy & Precautions, NPO status , Patient's Chart, lab work & pertinent test results  Airway Mallampati: II  TM Distance: >3 FB Neck ROM: Full    Dental   Pulmonary Current Smoker and Patient abstained from smoking.   breath sounds clear to auscultation       Cardiovascular negative cardio ROS  Rhythm:Regular Rate:Normal     Neuro/Psych  PSYCHIATRIC DISORDERS      negative neurological ROS     GI/Hepatic negative GI ROS,,,(+)     substance abuse    Endo/Other  negative endocrine ROS    Renal/GU negative Renal ROS     Musculoskeletal   Abdominal   Peds  Hematology negative hematology ROS (+)   Anesthesia Other Findings   Reproductive/Obstetrics                             Anesthesia Physical Anesthesia Plan  ASA: 3  Anesthesia Plan: MAC   Post-op Pain Management:    Induction:   PONV Risk Score and Plan: 0 and Propofol infusion  Airway Management Planned: Natural Airway and Nasal Cannula  Additional Equipment:   Intra-op Plan:   Post-operative Plan:   Informed Consent: I have reviewed the patients History and Physical, chart, labs and discussed the procedure including the risks, benefits and alternatives for the proposed anesthesia with the patient or authorized representative who has indicated his/her understanding and acceptance.       Plan Discussed with:   Anesthesia Plan Comments:        Anesthesia Quick Evaluation

## 2023-08-09 NOTE — Progress Notes (Signed)
   08/09/23 1133  SDOH Interventions  Food Insecurity Interventions Inpatient TOC;Other (Comment) (Appropriate resources provided.)   CSW acknowledges consult for substance use counseling and resources. Pt also with SDOH concerns for food, transportation and declined to answer for housing. CSW met with pt at bedside to complete assessment. Pt noting current IV Substance use and requesting treatment resources. Pt stating he is currently having housing issues. He stays where he can, sometimes on the streets. Pt notes issues getting food, and transportation issues. CSW provided appropriate resources and bus passes for pt. TOC is available for any further needs.

## 2023-08-09 NOTE — Progress Notes (Signed)
PROGRESS NOTE  Trevor [name]  FAO:[id number] DOB: 1981-01-19 DOA: 08/06/2023 PCP: Pcp, No   Brief Narrative: Patient is a 42 year old male with history of bipolar disorder, substance abuse, chronic pain syndrome on methadone, bipolar disorder, hyperlipidemia who was initially seen in the emergency department for the evaluation of an right  lower extremity abscess which was drained and was discharged on oral antibiotics.  Recalled to the emergency department due to positive blood cultures for MSSA which was sent on 11/12.Marland Kitchen  He declined to stay for admission and signed AMA.  Patient presented himself again for admission with intention for treatment of bacteremia.  Currently febrile.  Started on cefazolin.  ID consulted.  No vegetations as per TEE.  Due to his current IV drug use, patient needs to be inpatient for IV antibiotics.  Patient has high chance  of signing  AMA anytime  Assessment & Plan:  Principal Problem:   MSSA bacteremia Active Problems:   Abscess of left lower extremity   MSSA bacteremia/right lower extremity abscess: Presented initially on 11/12 for complaint of in insect bite on his left knee.  Found to have erythema, swelling and abscess on the medial side of the right knee.  It was drained and he was sent home on oral antibiotics but he was recalled after blood culture sent from 11/12 showed MSSA.  Patient came to the emergency department but signed AMA.  He then presented again. Currently he is afebrile.  No leukocytosis.  Started on cefazolin as per previous blood culture report.  Repeat blood cultures have been sent,NGTD.  ID consulted and following. Abscess around the left knee appears dry and healing well.  Thoracic MRI did not show any finding of discitis or osteomyelitis. TEE showed normal EF, no vegetation. Might need prolonged hospitalization due to need of IV antibiotics on the background of IV drug use  Substance abuse disorder: History of IV drug use.  Takes  marijuana, methamphetamine.Last IV drug use with methamphetamine just few days ago  Bipolar disorder: Currently not on medications.  We recommend outpatient follow-up with psychiatry  Smoking: Nicotine patch ordered.        DVT prophylaxis:enoxaparin (LOVENOX) injection 40 mg Start: 08/06/23 1900     Code Status: Full Code  Family Communication: None at the bedside  Patient status:Inpatient  Patient is from :home  Anticipated discharge ON:GEXB  Estimated DC date:after full work up   Consultants: ID  Procedures:None  Antimicrobials:  Anti-infectives (From admission, onward)    Start     Dose/Rate Route Frequency Ordered Stop   08/06/23 2200  ceFAZolin (ANCEF) IVPB 2g/100 mL premix        2 g 200 mL/hr over 30 Minutes Intravenous Every 8 hours 08/06/23 2049     08/06/23 1715  ceFAZolin (ANCEF) IVPB 2g/100 mL premix  Status:  Discontinued        2 g 200 mL/hr over 30 Minutes Intravenous  Once 08/06/23 1701 08/06/23 2049       Subjective: Patient seen and examined at bedside today.  Hemodynamically stable lying on bed,just came from TEE.  Denies any pain or discomfort  Objective: Vitals:   08/09/23 0418 08/09/23 0748 08/09/23 0906 08/09/23 0920  BP: 117/66 (!) 121/93 (!) 111/90 120/83  Pulse: 62 84 81   Resp: 16 17 18    Temp: 98.2 F (36.8 C) 97.8 F (36.6 C) (!) 79.1 F (26.2 C) 97.6 F (36.4 C)  TempSrc: Oral Temporal Temporal Oral  SpO2: 96% 100% 93%  Weight:      Height:        Intake/Output Summary (Last 24 hours) at 08/09/2023 1304 Last data filed at 08/08/2023 2051 Gross per 24 hour  Intake 240 ml  Output --  Net 240 ml   Filed Weights   08/06/23 1543  Weight: 74.8 kg    Examination:  General exam: Overall comfortable, not in distress, thin and deconditioned HEENT: PERRL Respiratory system:  no wheezes or crackles  Cardiovascular system: S1 & S2 heard, RRR.  Gastrointestinal system: Abdomen is nondistended, soft and  nontender. Central nervous system: Alert and oriented Extremities: No edema, no clubbing ,no cyanosis, healing abscess area around the left medial knee Skin: No rashes, no ulcers,no icterus      Data Reviewed: I have personally reviewed following labs and imaging studies  CBC: Recent Labs  Lab 08/02/23 2330 08/05/23 1035 08/06/23 1555 08/07/23 0634  WBC 11.6* 5.7 6.7 4.9  NEUTROABS 8.7* 3.8 3.8  --   HGB 12.7* 12.1* 12.4* 12.7*  HCT 38.7* 36.5* 37.5* 38.3*  MCV 91.3 90.8 89.3 89.7  PLT 254 272 313 284   Basic Metabolic Panel: Recent Labs  Lab 08/02/23 2330 08/05/23 1035 08/06/23 1555 08/07/23 0634  NA 138 135 138 137  K 3.9 3.5 3.9 3.9  CL 105 102 101 102  CO2 25 27 28 29   GLUCOSE 107* 126* 83 123*  BUN 20 19 10 17   CREATININE 0.85 0.83 0.92 0.90  CALCIUM 8.9 8.6* 8.9 9.1     Recent Results (from the past 240 hour(s))  Culture, blood (Routine x 2)     Status: Abnormal   Collection Time: 08/02/23 11:30 PM   Specimen: BLOOD  Result Value Ref Range Status   Specimen Description   Final    BLOOD BLOOD LEFT ARM Performed at [facility], [address] [place], [place] [address]    Special Requests   Final    BOTTLES DRAWN AEROBIC ONLY Blood Culture results may not be optimal due to an inadequate volume of blood received in culture bottles Performed at [facility], [address] [place], [place] [address]    Culture  Setup Time   Final    GRAM POSITIVE COCCI IN CLUSTERS AEROBIC BOTTLE ONLY CRITICAL RESULT CALLED TO, READ BACK BY AND VERIFIED WITH: NVivia Ewing RN 08/03/23 @ 2130 BY AB Performed at [facility], [address] [place], [place] [address]    Culture STAPHYLOCOCCUS AUREUS (A)  Final   Report Status 08/05/2023 FINAL  Final   Organism ID, Bacteria STAPHYLOCOCCUS AUREUS  Final      Susceptibility   Staphylococcus aureus - MIC*    CIPROFLOXACIN <=0.5 SENSITIVE Sensitive     ERYTHROMYCIN <=0.25  SENSITIVE Sensitive     GENTAMICIN <=0.5 SENSITIVE Sensitive     OXACILLIN 0.5 SENSITIVE Sensitive     TETRACYCLINE <=1 SENSITIVE Sensitive     VANCOMYCIN 1 SENSITIVE Sensitive     TRIMETH/SULFA <=10 SENSITIVE Sensitive     CLINDAMYCIN <=0.25 SENSITIVE Sensitive     RIFAMPIN <=0.5 SENSITIVE Sensitive     Inducible Clindamycin NEGATIVE Sensitive     LINEZOLID 2 SENSITIVE Sensitive     * STAPHYLOCOCCUS AUREUS  Blood Culture ID Panel (Reflexed)     Status: Abnormal   Collection Time: 08/02/23 11:30 PM  Result Value Ref Range Status   Enterococcus faecalis NOT DETECTED NOT DETECTED Final   Enterococcus Faecium NOT DETECTED NOT DETECTED Final  Listeria monocytogenes NOT DETECTED NOT DETECTED Final   Staphylococcus species DETECTED (A) NOT DETECTED Final    Comment: CRITICAL RESULT CALLED TO, READ BACK BY AND VERIFIED WITH: N. WHEATLEY RN 08/03/23 @ 2130 BY AB    Staphylococcus aureus (BCID) DETECTED (A) NOT DETECTED Final    Comment: CRITICAL RESULT CALLED TO, READ BACK BY AND VERIFIED WITH: N. WHEATLEY RN 08/03/23 @ 2130 BY AB    Staphylococcus epidermidis NOT DETECTED NOT DETECTED Final   Staphylococcus lugdunensis NOT DETECTED NOT DETECTED Final   Streptococcus species NOT DETECTED NOT DETECTED Final   Streptococcus agalactiae NOT DETECTED NOT DETECTED Final   Streptococcus pneumoniae NOT DETECTED NOT DETECTED Final   Streptococcus pyogenes NOT DETECTED NOT DETECTED Final   A.calcoaceticus-baumannii NOT DETECTED NOT DETECTED Final   Bacteroides fragilis NOT DETECTED NOT DETECTED Final   Enterobacterales NOT DETECTED NOT DETECTED Final   Enterobacter cloacae complex NOT DETECTED NOT DETECTED Final   Escherichia coli NOT DETECTED NOT DETECTED Final   Klebsiella aerogenes NOT DETECTED NOT DETECTED Final   Klebsiella oxytoca NOT DETECTED NOT DETECTED Final   Klebsiella pneumoniae NOT DETECTED NOT DETECTED Final   Proteus species NOT DETECTED NOT DETECTED Final   Salmonella species  NOT DETECTED NOT DETECTED Final   Serratia marcescens NOT DETECTED NOT DETECTED Final   Haemophilus influenzae NOT DETECTED NOT DETECTED Final   Neisseria meningitidis NOT DETECTED NOT DETECTED Final   Pseudomonas aeruginosa NOT DETECTED NOT DETECTED Final   Stenotrophomonas maltophilia NOT DETECTED NOT DETECTED Final   Candida albicans NOT DETECTED NOT DETECTED Final   Candida auris NOT DETECTED NOT DETECTED Final   Candida glabrata NOT DETECTED NOT DETECTED Final   Candida krusei NOT DETECTED NOT DETECTED Final   Candida parapsilosis NOT DETECTED NOT DETECTED Final   Candida tropicalis NOT DETECTED NOT DETECTED Final   Cryptococcus neoformans/gattii NOT DETECTED NOT DETECTED Final   Meth resistant mecA/C and MREJ NOT DETECTED NOT DETECTED Final    Comment: Performed at [facility], [address], [place], [place] [address]  Resp panel by RT-PCR (RSV, Flu A&B, Covid) Anterior Nasal Swab     Status: None   Collection Time: 08/05/23  9:49 AM   Specimen: Anterior Nasal Swab  Result Value Ref Range Status   SARS Coronavirus 2 by RT PCR NEGATIVE NEGATIVE Final    Comment: (NOTE) SARS-CoV-2 target nucleic acids are NOT DETECTED.  The SARS-CoV-2 RNA is generally detectable in upper respiratory specimens during the acute phase of infection. The lowest concentration of SARS-CoV-2 viral copies this assay can detect is 138 copies/mL. A negative result does not preclude SARS-Cov-2 infection and should not be used as the sole basis for treatment or other patient management decisions. A negative result may occur with  improper specimen collection/handling, submission of specimen other than nasopharyngeal swab, presence of viral mutation(s) within the areas targeted by this assay, and inadequate number of viral copies(<138 copies/mL). A negative result must be combined with clinical observations, patient history, and epidemiological information. The expected result is Negative.  Fact  Sheet for Patients:  BloggerCourse.com  Fact Sheet for Healthcare Providers:  SeriousBroker.it  This test is no t yet approved or cleared by the [place] FDA and  has been authorized for detection and/or diagnosis of SARS-CoV-2 by FDA under an Emergency Use Authorization (EUA). This EUA will remain  in effect (meaning this test can be used) for the duration of the COVID-19 declaration under Section 564(b)(1) of the Act, 21  U.S.C.section 360bbb-3(b)(1), unless the authorization is terminated  or revoked sooner.       Influenza A by PCR NEGATIVE NEGATIVE Final   Influenza B by PCR NEGATIVE NEGATIVE Final    Comment: (NOTE) The Xpert Xpress SARS-CoV-2/FLU/RSV plus assay is intended as an aid in the diagnosis of influenza from Nasopharyngeal swab specimens and should not be used as a sole basis for treatment. Nasal washings and aspirates are unacceptable for Xpert Xpress SARS-CoV-2/FLU/RSV testing.  Fact Sheet for Patients: BloggerCourse.com  Fact Sheet for Healthcare Providers: SeriousBroker.it  This test is not yet approved or cleared by the [place] FDA and has been authorized for detection and/or diagnosis of SARS-CoV-2 by FDA under an Emergency Use Authorization (EUA). This EUA will remain in effect (meaning this test can be used) for the duration of the COVID-19 declaration under Section 564(b)(1) of the Act, 21 U.S.C. section 360bbb-3(b)(1), unless the authorization is terminated or revoked.     Resp Syncytial Virus by PCR NEGATIVE NEGATIVE Final    Comment: (NOTE) Fact Sheet for Patients: BloggerCourse.com  Fact Sheet for Healthcare Providers: SeriousBroker.it  This test is not yet approved or cleared by the [place] FDA and has been authorized for detection and/or diagnosis of SARS-CoV-2 by FDA under an  Emergency Use Authorization (EUA). This EUA will remain in effect (meaning this test can be used) for the duration of the COVID-19 declaration under Section 564(b)(1) of the Act, 21 U.S.C. section 360bbb-3(b)(1), unless the authorization is terminated or revoked.  Performed at [facility], [address] [place], [place] [address]   Blood Culture (routine x 2)     Status: None (Preliminary result)   Collection Time: 08/05/23 10:30 AM   Specimen: BLOOD  Result Value Ref Range Status   Specimen Description   Final    BLOOD SITE NOT SPECIFIED Performed at [facility], [address], [place], [place] [address]    Special Requests   Final    BOTTLES DRAWN AEROBIC AND ANAEROBIC Blood Culture results may not be optimal due to an excessive volume of blood received in culture bottles Performed at [facility], [address], [place], [place] [address]    Culture   Final    NO GROWTH 4 DAYS Performed at [facility], [address], [place], [place] [address]    Report Status PENDING  Incomplete  Blood Culture (routine x 2)     Status: None (Preliminary result)   Collection Time: 08/05/23 10:30 AM   Specimen: BLOOD  Result Value Ref Range Status   Specimen Description   Final    BLOOD SITE NOT SPECIFIED Performed at [facility], [address] [place], [place] [address]    Special Requests   Final    BOTTLES DRAWN AEROBIC AND ANAEROBIC Blood Culture results may not be optimal due to an excessive volume of blood received in culture bottles Performed at [facility], [address], [place], [place] [address]    Culture   Final    NO GROWTH 4 DAYS Performed at [facility], [address] [place], [place] [address]    Report Status PENDING  Incomplete     Radiology Studies: ECHO TEE  Result Date: 08/09/2023    TRANSESOPHOGEAL ECHO REPORT   Patient Name:   DADEN MONJARAZ Date of Exam: 08/09/2023 Medical Rec #:  [id number]       Height:  71.0 in Accession #:    [id number]      Weight:       164.9 lb Date of Birth:  15-Aug-1981        BSA:          1.943 m Patient Age:    42 years        BP:           111/90 mmHg Patient Gender: M               HR:           86 bpm. Exam Location:  Inpatient Procedure: 2D Echo, Color Doppler and Cardiac Doppler Indications:     Bacteremia  History:         Patient has no prior history of Echocardiogram examinations.                  Risk Factors:Dyslipidemia and Polysubstance Abuse.  Sonographer:     Irving Burton Senior RDCS Referring Phys:  [id number] WEAVER Diagnosing Phys: Olga Millers MD  Sonographer Comments: Myer Haff [phone] PROCEDURE: After discussion of the risks and benefits of a TEE, an informed consent was obtained from the patient. The transesophogeal probe was passed without difficulty through the esophogus of the patient. Sedation performed by different physician. The patient was monitored while under deep sedation. Anesthestetic sedation was provided intravenously by Anesthesiology: 100mg  of Propofol, 80mg  of Lidocaine. The patient developed no complications during the procedure.  IMPRESSIONS  1. No vegetations.  2. Left ventricular ejection fraction, by estimation, is 55 to 60%. The left ventricle has normal function. The left ventricle has no regional wall motion abnormalities.  3. Right ventricular systolic function is normal. The right ventricular size is normal.  4. No left atrial/left atrial appendage thrombus was detected.  5. The mitral valve is normal in structure. No evidence of mitral valve regurgitation.  6. The aortic valve is tricuspid. Aortic valve regurgitation is not visualized.  7. There is mild (Grade II) plaque involving the descending aorta.  8. Agitated saline contrast bubble study was negative, with no evidence of any interatrial shunt. FINDINGS  Left Ventricle: Left ventricular ejection fraction, by estimation,  is 55 to 60%. The left ventricle has normal function. The left ventricle has no regional wall motion abnormalities. The left ventricular internal cavity size was normal in size. Right Ventricle: The right ventricular size is normal. Right ventricular systolic function is normal. Left Atrium: Left atrial size was normal in size. No left atrial/left atrial appendage thrombus was detected. Right Atrium: Right atrial size was normal in size. Pericardium: There is no evidence of pericardial effusion. Mitral Valve: The mitral valve is normal in structure. No evidence of mitral valve regurgitation. Tricuspid Valve: The tricuspid valve is normal in structure. Tricuspid valve regurgitation is trivial. Aortic Valve: The aortic valve is tricuspid. Aortic valve regurgitation is not visualized. Pulmonic Valve: The pulmonic valve was normal in structure. Pulmonic valve regurgitation is not visualized. Aorta: The aortic root is normal in size and structure. There is mild (Grade II) plaque involving the descending aorta. IAS/Shunts: No atrial level shunt detected by color flow Doppler. Agitated saline contrast bubble study was negative, with no evidence of any interatrial shunt. Additional Comments: No vegetations. Spectral Doppler performed. Olga Millers MD Electronically signed by Olga Millers MD Signature Date/Time: 08/09/2023/12:09:48 PM    Final    VAS [place] LOWER EXTREMITY VENOUS (DVT)  Result Date: 08/09/2023  Lower Venous DVT Study Patient Name:  Abran Duke  Date of Exam:   08/08/2023 Medical Rec #: [id number]        Accession #:    [id number] Date of Birth: 1980-10-26         Patient Gender: M Patient Age:   27 years Exam Location:  [facility] Procedure:      VAS [place] LOWER EXTREMITY VENOUS (DVT) Referring Phys: PROSPER AMPONSAH --------------------------------------------------------------------------------  Indications: Edema, and Abscess. Other Indications: Bacteremia. Risk Factors: IV drug use.  Limitations: Poor ultrasound/tissue interface. Comparison Study: No prior study Performing Technologist: Sherren Kerns RVS  Examination Guidelines: A complete evaluation includes B-mode imaging, spectral Doppler, color Doppler, and power Doppler as needed of all accessible portions of each vessel. Bilateral testing is considered an integral part of a complete examination. Limited examinations for reoccurring indications may be performed as noted. The reflux portion of the exam is performed with the patient in reverse Trendelenburg.  +-----+---------------+---------+-----------+----------+--------------+ RIGHTCompressibilityPhasicitySpontaneityPropertiesThrombus Aging +-----+---------------+---------+-----------+----------+--------------+ CFV  Full           Yes      Yes                                 +-----+---------------+---------+-----------+----------+--------------+   +---------+---------------+---------+-----------+----------+-------------------+ LEFT     CompressibilityPhasicitySpontaneityPropertiesThrombus Aging      +---------+---------------+---------+-----------+----------+-------------------+ CFV      Full           Yes      Yes                                      +---------+---------------+---------+-----------+----------+-------------------+ SFJ      Full                                                             +---------+---------------+---------+-----------+----------+-------------------+ FV Prox  Full           Yes      Yes                                      +---------+---------------+---------+-----------+----------+-------------------+ FV Mid   Full                                                             +---------+---------------+---------+-----------+----------+-------------------+ FV DistalFull           Yes      Yes                                       +---------+---------------+---------+-----------+----------+-------------------+ PFV      Full           Yes      Yes                                      +---------+---------------+---------+-----------+----------+-------------------+  POP      Full           Yes      Yes                                      +---------+---------------+---------+-----------+----------+-------------------+ PTV      Full                                                             +---------+---------------+---------+-----------+----------+-------------------+ PERO                                                  Not well visualized +---------+---------------+---------+-----------+----------+-------------------+ Gastroc  Full                                                             +---------+---------------+---------+-----------+----------+-------------------+     Summary: RIGHT: - No evidence of common femoral vein obstruction.   LEFT: - There is no evidence of deep vein thrombosis in the lower extremity. However, portions of this examination were limited- see technologist comments above.  - No cystic structure found in the popliteal fossa.  *See table(s) above for measurements and observations. Electronically signed by Gerarda Fraction on 08/09/2023 at 11:40:00 AM.    Final    EP STUDY  Result Date: 08/09/2023 See surgical note for result.  MR THORACIC SPINE W WO CONTRAST  Result Date: 08/07/2023 CLINICAL DATA:  Bacteremia.  Midline tenderness from T10-T12. EXAM: MRI THORACIC WITHOUT AND WITH CONTRAST TECHNIQUE: Multiplanar and multiecho pulse sequences of the thoracic spine were obtained without and with intravenous contrast. CONTRAST:  7mL GADAVIST GADOBUTROL 1 MMOL/ML IV SOLN COMPARISON:  None Available. FINDINGS: Precontrast axial T1 images were inadvertently not obtained through the upper thoracic spine. Alignment: Normal. Vertebrae: No fracture, suspicious marrow lesion, significant  marrow edema, or evidence of discitis/osteomyelitis. Cord: Normal cord signal and morphology. No abnormal intradural enhancement. Paraspinal and other soft tissues: No paraspinal or epidural fluid collection. Disc levels: Disc bulging and asymmetric right uncovertebral spurring at C6-7 resulting in mild spinal stenosis and mild-to-moderate right neural foraminal stenosis. Small central disc protrusions at T5-6 and T6-7 without stenosis. Mild lower thoracic facet hypertrophy. IMPRESSION: 1. No evidence of thoracic spine infection. 2. Mild thoracic disc degeneration without stenosis. 3. Mild spinal stenosis and mild-to-moderate right neural foraminal stenosis at C6-7. Electronically Signed   By: Sebastian Ache M.D.   On: 08/07/2023 18:29    Scheduled Meds:  enoxaparin (LOVENOX) injection  40 mg Subcutaneous Q24H   Continuous Infusions:   ceFAZolin (ANCEF) IV 2 g (08/09/23 1610)     LOS: 3 days   Burnadette Pop, MD [facility] P11/19/2024, 1:04 PM

## 2023-08-09 NOTE — Anesthesia Postprocedure Evaluation (Signed)
Anesthesia Post Note  Patient: Trevor [name]  Procedure(s) Performed: TRANSESOPHAGEAL ECHOCARDIOGRAM     Patient location during evaluation: PACU Anesthesia Type: MAC Level of consciousness: awake and alert Pain management: pain level controlled Vital Signs Assessment: post-procedure vital signs reviewed and stable Respiratory status: spontaneous breathing, nonlabored ventilation, respiratory function stable and patient connected to nasal cannula oxygen Cardiovascular status: stable and blood pressure returned to baseline Postop Assessment: no apparent nausea or vomiting Anesthetic complications: no  No notable events documented.  Last Vitals:  Vitals:   08/09/23 0906 08/09/23 0920  BP: (!) 111/90 120/83  Pulse: 81   Resp: 18   Temp: (!) 26.2 C 36.4 C  SpO2: 93%     Last Pain:  Vitals:   08/09/23 0920  TempSrc: Oral  PainSc: 0-No pain                 Kennieth Rad

## 2023-08-09 NOTE — Transfer of Care (Signed)
Immediate Anesthesia Transfer of Care Note  Patient: Trevor [name]  Procedure(s) Performed: TRANSESOPHAGEAL ECHOCARDIOGRAM  Patient Location: Cath Lab  Anesthesia Type:MAC  Level of Consciousness: awake, drowsy, and patient cooperative  Airway & Oxygen Therapy: Patient Spontanous Breathing and Patient connected to nasal cannula oxygen  Post-op Assessment: Report given to RN and Post -op Vital signs reviewed and stable  Post vital signs: Reviewed and stable  Last Vitals:  Vitals Value Taken Time  BP 111/90 08/09/23 0906  Temp 26.2 C 08/09/23 0906  Pulse 85 08/09/23 0907  Resp 18 08/09/23 0907  SpO2 96 % 08/09/23 0907  Vitals shown include unfiled device data.  Last Pain:  Vitals:   08/09/23 0906  TempSrc: Temporal  PainSc: 0-No pain      Patients Stated Pain Goal: 3 (08/06/23 2303)  Complications: No notable events documented.

## 2023-08-10 DIAGNOSIS — B9561 Methicillin susceptible Staphylococcus aureus infection as the cause of diseases classified elsewhere: Secondary | ICD-10-CM | POA: Diagnosis not present

## 2023-08-10 DIAGNOSIS — R7881 Bacteremia: Secondary | ICD-10-CM | POA: Diagnosis not present

## 2023-08-10 LAB — CULTURE, BLOOD (ROUTINE X 2)
Culture: NO GROWTH
Culture: NO GROWTH

## 2023-08-10 NOTE — Progress Notes (Signed)
PROGRESS NOTE  Trevor [name]  EXB:[id number] DOB: [birth year] DOA: 08/06/2023 PCP: Pcp, No   Brief Narrative: Patient is a 42 year old male with history of bipolar disorder, substance abuse, chronic pain syndrome on methadone, bipolar disorder, hyperlipidemia who was initially seen in the emergency department for the evaluation of  left  lower extremity abscess which was drained and was discharged on oral antibiotics.  Recalled to the emergency department due to positive blood cultures for MSSA which was sent on 11/12.Marland Kitchen  He declined to stay for admission and signed AMA.  Patient presented himself again for admission with intention for treatment of bacteremia.  Currently febrile.  Started on cefazolin.  ID consulted.  No vegetations as per TEE.  Due to his current IV drug use, patient needs to be inpatient for IV antibiotics.  Patient has high chance  of signing  AMA anytime  Assessment & Plan:  Principal Problem:   MSSA bacteremia Active Problems:   Abscess of left lower extremity   MSSA bacteremia/left lower extremity abscess: Presented initially on 11/12 for complaint of in insect bite on his left knee.  Found to have erythema, swelling and abscess on the medial side of the left knee.  It was drained and he was sent home on oral antibiotics but he was recalled after blood culture sent from 11/12 showed MSSA.  Patient came to the emergency department but signed AMA.  He then presented again. Currently he is afebrile.  No leukocytosis.  Started on cefazolin as per previous blood culture report.  Repeat blood cultures have been sent,NGTD.  ID consulted and following. Abscess around the left knee appears dry and healing well.  Thoracic MRI did not show any finding of discitis or osteomyelitis. TEE showed normal EF, no vegetation. Might need prolonged hospitalization due to need of IV antibiotics on the background of IV drug use  Substance abuse disorder: History of IV drug use.  Takes marijuana,  methamphetamine.Last IV drug use with methamphetamine just few days ago  Bipolar disorder: Currently not on medications.  We recommend outpatient follow-up with psychiatry  Smoking: Nicotine patch ordered.        DVT prophylaxis:enoxaparin (LOVENOX) injection 40 mg Start: 08/06/23 1900     Code Status: Full Code  Family Communication: None at the bedside  Patient status:Inpatient  Patient is from :home  Anticipated discharge NU:UVOZ  Estimated DC date: Awaiting for ID recommendation for duration of antibiotics   Consultants: ID  Procedures:None  Antimicrobials:  Anti-infectives (From admission, onward)    Start     Dose/Rate Route Frequency Ordered Stop   08/06/23 2200  ceFAZolin (ANCEF) IVPB 2g/100 mL premix        2 g 200 mL/hr over 30 Minutes Intravenous Every 8 hours 08/06/23 2049     08/06/23 1715  ceFAZolin (ANCEF) IVPB 2g/100 mL premix  Status:  Discontinued        2 g 200 mL/hr over 30 Minutes Intravenous  Once 08/06/23 1701 08/06/23 2049       Subjective: Patient seen and examined at the bedside today.  Appears very comfortable, lying in bed.  Understands that he needs to be in the hospital for IV antibiotics.  No new complaints  Objective: Vitals:   08/09/23 1504 08/09/23 2019 08/10/23 0456 08/10/23 0715  BP: (!) 119/92 109/67 106/64 100/60  Pulse: 99 80 71 79  Resp: 19 16 18 16   Temp: 98.6 F (37 C) 98.4 F (36.9 C) 98.3 F (36.8 C) 97.7 F (36.5  C)  TempSrc: Oral Oral Oral Oral  SpO2: 100% 100% 100% 99%  Weight:      Height:       No intake or output data in the 24 hours ending 08/10/23 1108  Filed Weights   08/06/23 1543  Weight: 74.8 kg    Examination:  General exam: Overall comfortable, not in distress, thin and deconditioned HEENT: PERRL Respiratory system:  no wheezes or crackles  Cardiovascular system: S1 & S2 heard, RRR.  Gastrointestinal system: Abdomen is nondistended, soft and nontender. Central nervous system: Alert  and oriented Extremities: No edema, no clubbing ,no cyanosis, healing abscess area on the left medial knee Skin: No rashes, no ulcers,no icterus       Data Reviewed: I have personally reviewed following labs and imaging studies  CBC: Recent Labs  Lab 08/05/23 1035 08/06/23 1555 08/07/23 0634  WBC 5.7 6.7 4.9  NEUTROABS 3.8 3.8  --   HGB 12.1* 12.4* 12.7*  HCT 36.5* 37.5* 38.3*  MCV 90.8 89.3 89.7  PLT 272 313 284   Basic Metabolic Panel: Recent Labs  Lab 08/05/23 1035 08/06/23 1555 08/07/23 0634  NA 135 138 137  K 3.5 3.9 3.9  CL 102 101 102  CO2 27 28 29   GLUCOSE 126* 83 123*  BUN 19 10 17   CREATININE 0.83 0.92 0.90  CALCIUM 8.6* 8.9 9.1     Recent Results (from the past 240 hour(s))  Culture, blood (Routine x 2)     Status: Abnormal   Collection Time: 08/02/23 11:30 PM   Specimen: BLOOD  Result Value Ref Range Status   Specimen Description   Final    BLOOD BLOOD LEFT ARM Performed at [facility], [address], [place], [place] [address]    Special Requests   Final    BOTTLES DRAWN AEROBIC ONLY Blood Culture results may not be optimal due to an inadequate volume of blood received in culture bottles Performed at [facility], [address] [place], [place] [address]    Culture  Setup Time   Final    GRAM POSITIVE COCCI IN CLUSTERS AEROBIC BOTTLE ONLY CRITICAL RESULT CALLED TO, READ BACK BY AND VERIFIED WITH: NVivia Ewing RN 08/03/23 @ 2130 BY AB Performed at [facility], [address], [place], [place] [address]    Culture STAPHYLOCOCCUS AUREUS (A)  Final   Report Status 08/05/2023 FINAL  Final   Organism ID, Bacteria STAPHYLOCOCCUS AUREUS  Final      Susceptibility   Staphylococcus aureus - MIC*    CIPROFLOXACIN <=0.5 SENSITIVE Sensitive     ERYTHROMYCIN <=0.25 SENSITIVE Sensitive     GENTAMICIN <=0.5 SENSITIVE Sensitive     OXACILLIN 0.5 SENSITIVE Sensitive     TETRACYCLINE <=1 SENSITIVE Sensitive      VANCOMYCIN 1 SENSITIVE Sensitive     TRIMETH/SULFA <=10 SENSITIVE Sensitive     CLINDAMYCIN <=0.25 SENSITIVE Sensitive     RIFAMPIN <=0.5 SENSITIVE Sensitive     Inducible Clindamycin NEGATIVE Sensitive     LINEZOLID 2 SENSITIVE Sensitive     * STAPHYLOCOCCUS AUREUS  Blood Culture ID Panel (Reflexed)     Status: Abnormal   Collection Time: 08/02/23 11:30 PM  Result Value Ref Range Status   Enterococcus faecalis NOT DETECTED NOT DETECTED Final   Enterococcus Faecium NOT DETECTED NOT DETECTED Final   Listeria monocytogenes NOT DETECTED NOT DETECTED Final   Staphylococcus species DETECTED (A) NOT DETECTED Final    Comment: CRITICAL RESULT  CALLED TO, READ BACK BY AND VERIFIED WITH: N. WHEATLEY RN 08/03/23 @ 2130 BY AB    Staphylococcus aureus (BCID) DETECTED (A) NOT DETECTED Final    Comment: CRITICAL RESULT CALLED TO, READ BACK BY AND VERIFIED WITH: N. WHEATLEY RN 08/03/23 @ 2130 BY AB    Staphylococcus epidermidis NOT DETECTED NOT DETECTED Final   Staphylococcus lugdunensis NOT DETECTED NOT DETECTED Final   Streptococcus species NOT DETECTED NOT DETECTED Final   Streptococcus agalactiae NOT DETECTED NOT DETECTED Final   Streptococcus pneumoniae NOT DETECTED NOT DETECTED Final   Streptococcus pyogenes NOT DETECTED NOT DETECTED Final   A.calcoaceticus-baumannii NOT DETECTED NOT DETECTED Final   Bacteroides fragilis NOT DETECTED NOT DETECTED Final   Enterobacterales NOT DETECTED NOT DETECTED Final   Enterobacter cloacae complex NOT DETECTED NOT DETECTED Final   Escherichia coli NOT DETECTED NOT DETECTED Final   Klebsiella aerogenes NOT DETECTED NOT DETECTED Final   Klebsiella oxytoca NOT DETECTED NOT DETECTED Final   Klebsiella pneumoniae NOT DETECTED NOT DETECTED Final   Proteus species NOT DETECTED NOT DETECTED Final   Salmonella species NOT DETECTED NOT DETECTED Final   Serratia marcescens NOT DETECTED NOT DETECTED Final   Haemophilus influenzae NOT DETECTED NOT DETECTED Final    Neisseria meningitidis NOT DETECTED NOT DETECTED Final   Pseudomonas aeruginosa NOT DETECTED NOT DETECTED Final   Stenotrophomonas maltophilia NOT DETECTED NOT DETECTED Final   Candida albicans NOT DETECTED NOT DETECTED Final   Candida auris NOT DETECTED NOT DETECTED Final   Candida glabrata NOT DETECTED NOT DETECTED Final   Candida krusei NOT DETECTED NOT DETECTED Final   Candida parapsilosis NOT DETECTED NOT DETECTED Final   Candida tropicalis NOT DETECTED NOT DETECTED Final   Cryptococcus neoformans/gattii NOT DETECTED NOT DETECTED Final   Meth resistant mecA/C and MREJ NOT DETECTED NOT DETECTED Final    Comment: Performed at [facility], [address], [place], [place] [address]  Resp panel by RT-PCR (RSV, Flu A&B, Covid) Anterior Nasal Swab     Status: None   Collection Time: 08/05/23  9:49 AM   Specimen: Anterior Nasal Swab  Result Value Ref Range Status   SARS Coronavirus 2 by RT PCR NEGATIVE NEGATIVE Final    Comment: (NOTE) SARS-CoV-2 target nucleic acids are NOT DETECTED.  The SARS-CoV-2 RNA is generally detectable in upper respiratory specimens during the acute phase of infection. The lowest concentration of SARS-CoV-2 viral copies this assay can detect is 138 copies/mL. A negative result does not preclude SARS-Cov-2 infection and should not be used as the sole basis for treatment or other patient management decisions. A negative result may occur with  improper specimen collection/handling, submission of specimen other than nasopharyngeal swab, presence of viral mutation(s) within the areas targeted by this assay, and inadequate number of viral copies(<138 copies/mL). A negative result must be combined with clinical observations, patient history, and epidemiological information. The expected result is Negative.  Fact Sheet for Patients:  BloggerCourse.com  Fact Sheet for Healthcare Providers:   SeriousBroker.it  This test is no t yet approved or cleared by the [place] FDA and  has been authorized for detection and/or diagnosis of SARS-CoV-2 by FDA under an Emergency Use Authorization (EUA). This EUA will remain  in effect (meaning this test can be used) for the duration of the COVID-19 declaration under Section 564(b)(1) of the Act, 21 U.S.C.section 360bbb-3(b)(1), unless the authorization is terminated  or revoked sooner.       Influenza A by PCR NEGATIVE  NEGATIVE Final   Influenza B by PCR NEGATIVE NEGATIVE Final    Comment: (NOTE) The Xpert Xpress SARS-CoV-2/FLU/RSV plus assay is intended as an aid in the diagnosis of influenza from Nasopharyngeal swab specimens and should not be used as a sole basis for treatment. Nasal washings and aspirates are unacceptable for Xpert Xpress SARS-CoV-2/FLU/RSV testing.  Fact Sheet for Patients: BloggerCourse.com  Fact Sheet for Healthcare Providers: SeriousBroker.it  This test is not yet approved or cleared by the [place] FDA and has been authorized for detection and/or diagnosis of SARS-CoV-2 by FDA under an Emergency Use Authorization (EUA). This EUA will remain in effect (meaning this test can be used) for the duration of the COVID-19 declaration under Section 564(b)(1) of the Act, 21 U.S.C. section 360bbb-3(b)(1), unless the authorization is terminated or revoked.     Resp Syncytial Virus by PCR NEGATIVE NEGATIVE Final    Comment: (NOTE) Fact Sheet for Patients: BloggerCourse.com  Fact Sheet for Healthcare Providers: SeriousBroker.it  This test is not yet approved or cleared by the [place] FDA and has been authorized for detection and/or diagnosis of SARS-CoV-2 by FDA under an Emergency Use Authorization (EUA). This EUA will remain in effect (meaning this test can be used) for  the duration of the COVID-19 declaration under Section 564(b)(1) of the Act, 21 U.S.C. section 360bbb-3(b)(1), unless the authorization is terminated or revoked.  Performed at [facility], [address] [place], [place] [address]   Blood Culture (routine x 2)     Status: None   Collection Time: 08/05/23 10:30 AM   Specimen: BLOOD  Result Value Ref Range Status   Specimen Description   Final    BLOOD SITE NOT SPECIFIED Performed at [facility], [address] [place], [place] [address]    Special Requests   Final    BOTTLES DRAWN AEROBIC AND ANAEROBIC Blood Culture results may not be optimal due to an excessive volume of blood received in culture bottles Performed at [facility], [address], [place], [place] [address]    Culture   Final    NO GROWTH 5 DAYS Performed at [facility], [address] [place], [place] [address]    Report Status 08/10/2023 FINAL  Final  Blood Culture (routine x 2)     Status: None   Collection Time: 08/05/23 10:30 AM   Specimen: BLOOD  Result Value Ref Range Status   Specimen Description   Final    BLOOD SITE NOT SPECIFIED Performed at [facility], [address], [place], [place] [address]    Special Requests   Final    BOTTLES DRAWN AEROBIC AND ANAEROBIC Blood Culture results may not be optimal due to an excessive volume of blood received in culture bottles Performed at [facility], [address], [place], [place] [address]    Culture   Final    NO GROWTH 5 DAYS Performed at [facility], [address], [place], [place] [address]    Report Status 08/10/2023 FINAL  Final     Radiology Studies: ECHO TEE  Result Date: 08/09/2023    TRANSESOPHOGEAL ECHO REPORT   Patient Name:   Trevor [name] Date of Exam: 08/09/2023 Medical Rec #:  [id number]       Height:       71.0 in Accession #:    [id number]      Weight:       164.9  lb Date of Birth:  1980-11-14        BSA:          1.943 m Patient Age:    42 years        BP:           111/90 mmHg Patient Gender: M               HR:           86 bpm. Exam Location:  Inpatient Procedure: 2D Echo, Color Doppler and Cardiac Doppler Indications:     Bacteremia  History:         Patient has no prior history of Echocardiogram examinations.                  Risk Factors:Dyslipidemia and Polysubstance Abuse.  Sonographer:     Irving Burton Senior RDCS Referring Phys:  [id number] WEAVER Diagnosing Phys: Olga Millers MD  Sonographer Comments: Myer Haff [phone] PROCEDURE: After discussion of the risks and benefits of a TEE, an informed consent was obtained from the patient. The transesophogeal probe was passed without difficulty through the esophogus of the patient. Sedation performed by different physician. The patient was monitored while under deep sedation. Anesthestetic sedation was provided intravenously by Anesthesiology: 100mg  of Propofol, 80mg  of Lidocaine. The patient developed no complications during the procedure.  IMPRESSIONS  1. No vegetations.  2. Left ventricular ejection fraction, by estimation, is 55 to 60%. The left ventricle has normal function. The left ventricle has no regional wall motion abnormalities.  3. Right ventricular systolic function is normal. The right ventricular size is normal.  4. No left atrial/left atrial appendage thrombus was detected.  5. The mitral valve is normal in structure. No evidence of mitral valve regurgitation.  6. The aortic valve is tricuspid. Aortic valve regurgitation is not visualized.  7. There is mild (Grade II) plaque involving the descending aorta.  8. Agitated saline contrast bubble study was negative, with no evidence of any interatrial shunt. FINDINGS  Left Ventricle: Left ventricular ejection fraction, by estimation, is 55 to 60%. The left ventricle has normal function. The left ventricle has no regional wall motion abnormalities. The left ventricular  internal cavity size was normal in size. Right Ventricle: The right ventricular size is normal. Right ventricular systolic function is normal. Left Atrium: Left atrial size was normal in size. No left atrial/left atrial appendage thrombus was detected. Right Atrium: Right atrial size was normal in size. Pericardium: There is no evidence of pericardial effusion. Mitral Valve: The mitral valve is normal in structure. No evidence of mitral valve regurgitation. Tricuspid Valve: The tricuspid valve is normal in structure. Tricuspid valve regurgitation is trivial. Aortic Valve: The aortic valve is tricuspid. Aortic valve regurgitation is not visualized. Pulmonic Valve: The pulmonic valve was normal in structure. Pulmonic valve regurgitation is not visualized. Aorta: The aortic root is normal in size and structure. There is mild (Grade II) plaque involving the descending aorta. IAS/Shunts: No atrial level shunt detected by color flow Doppler. Agitated saline contrast bubble study was negative, with no evidence of any interatrial shunt. Additional Comments: No vegetations. Spectral Doppler performed. Olga Millers MD Electronically signed by Olga Millers MD Signature Date/Time: 08/09/2023/12:09:48 PM    Final    VAS [place] LOWER EXTREMITY VENOUS (DVT)  Result Date: 08/09/2023  Lower Venous DVT Study Patient Name:  Trevor [name]  Date of Exam:   08/08/2023 Medical Rec #: [id number]  Accession #:    [id number] Date of Birth: [birth year]         Patient Gender: M Patient Age:   52 years Exam Location:  [facility] Procedure:      VAS [place] LOWER EXTREMITY VENOUS (DVT) Referring Phys: PROSPER AMPONSAH --------------------------------------------------------------------------------  Indications: Edema, and Abscess. Other Indications: Bacteremia. Risk Factors: IV drug use. Limitations: Poor ultrasound/tissue interface. Comparison Study: No prior study Performing Technologist: Sherren Kerns RVS  Examination  Guidelines: A complete evaluation includes B-mode imaging, spectral Doppler, color Doppler, and power Doppler as needed of all accessible portions of each vessel. Bilateral testing is considered an integral part of a complete examination. Limited examinations for reoccurring indications may be performed as noted. The reflux portion of the exam is performed with the patient in reverse Trendelenburg.  +-----+---------------+---------+-----------+----------+--------------+ RIGHTCompressibilityPhasicitySpontaneityPropertiesThrombus Aging +-----+---------------+---------+-----------+----------+--------------+ CFV  Full           Yes      Yes                                 +-----+---------------+---------+-----------+----------+--------------+   +---------+---------------+---------+-----------+----------+-------------------+ LEFT     CompressibilityPhasicitySpontaneityPropertiesThrombus Aging      +---------+---------------+---------+-----------+----------+-------------------+ CFV      Full           Yes      Yes                                      +---------+---------------+---------+-----------+----------+-------------------+ SFJ      Full                                                             +---------+---------------+---------+-----------+----------+-------------------+ FV Prox  Full           Yes      Yes                                      +---------+---------------+---------+-----------+----------+-------------------+ FV Mid   Full                                                             +---------+---------------+---------+-----------+----------+-------------------+ FV DistalFull           Yes      Yes                                      +---------+---------------+---------+-----------+----------+-------------------+ PFV      Full           Yes      Yes                                       +---------+---------------+---------+-----------+----------+-------------------+ POP      Full  Yes      Yes                                      +---------+---------------+---------+-----------+----------+-------------------+ PTV      Full                                                             +---------+---------------+---------+-----------+----------+-------------------+ PERO                                                  Not well visualized +---------+---------------+---------+-----------+----------+-------------------+ Gastroc  Full                                                             +---------+---------------+---------+-----------+----------+-------------------+     Summary: RIGHT: - No evidence of common femoral vein obstruction.   LEFT: - There is no evidence of deep vein thrombosis in the lower extremity. However, portions of this examination were limited- see technologist comments above.  - No cystic structure found in the popliteal fossa.  *See table(s) above for measurements and observations. Electronically signed by Gerarda Fraction on 08/09/2023 at 11:40:00 AM.    Final    EP STUDY  Result Date: 08/09/2023 See surgical note for result.   Scheduled Meds:  enoxaparin (LOVENOX) injection  40 mg Subcutaneous Q24H   Continuous Infusions:   ceFAZolin (ANCEF) IV 2 g (08/10/23 0535)     LOS: 4 days   Burnadette Pop, MD [facility] P11/20/2024, 11:08 AM

## 2023-08-10 NOTE — Plan of Care (Signed)
  Problem: Activity: Goal: Risk for activity intolerance will decrease Outcome: Progressing   Problem: Nutrition: Goal: Adequate nutrition will be maintained Outcome: Progressing   Problem: Safety: Goal: Ability to remain free from injury will improve Outcome: Progressing   Problem: Skin Integrity: Goal: Risk for impaired skin integrity will decrease Outcome: Progressing   

## 2023-08-10 NOTE — Plan of Care (Signed)
  Problem: Education: Goal: Knowledge of General Education information will improve Description: Including pain rating scale, medication(s)/side effects and non-pharmacologic comfort measures Outcome: Progressing   Problem: Health Behavior/Discharge Planning: Goal: Ability to manage health-related needs will improve Outcome: Progressing   Problem: Clinical Measurements: Goal: Ability to maintain clinical measurements within normal limits will improve Outcome: Progressing   Problem: Activity: Goal: Risk for activity intolerance will decrease Outcome: Progressing   Problem: Nutrition: Goal: Adequate nutrition will be maintained Outcome: Progressing   Problem: Pain Management: Goal: General experience of comfort will improve Outcome: Progressing

## 2023-08-10 NOTE — Progress Notes (Signed)
[facility]  Date of Admission:  08/06/2023   Total days of inpatient antibiotics 3  Principal Problem:   MSSA bacteremia Active Problems:   Abscess of left lower extremity          Assessment: 42 year old male with history of lumbar fusion and no hardware per report IVDA, bipolar disorder male with: #MSSA bacteremia Likely 2/2 left knee infection #IVDA - Initially left AMA on 11/12 and readmitted with positive blood cultures on 11/15.  Repeat blood culture 11/15 no growth so far - He had reported some low back pain, MRI T-spine negative for infection. - Source likely left knee as there is an abscess with associated cellulitis. Only small knee joint effusion on x-ray. - TTE and TEE without vegetation  Recommendations: -Continue cefazolin - Follow repeat blood cultures on 11/15, plan on 2 weeks of antibiotics from negative cultures EOT 11/28.  Patient amenable to staying for the hospitalization complete antibiotic course - He is interested in drug rehab counseling. - Given IVDA patient is not a candidate for PICC line for home antibiotics.   Microbiology:   Antibiotics: Cefazolin 11/15-present Cultures: Blood 11/12 1/1 MSSA 11/15 no growth 6x3 Urine  Other   SUBJECTIVE: Resting in bed.  Discussed plan as above Interval: Afebrile overnight.  WBC 4.9K.  Review of Systems: Review of Systems  All other systems reviewed and are negative.    Scheduled Meds:  enoxaparin (LOVENOX) injection  40 mg Subcutaneous Q24H   Continuous Infusions:   ceFAZolin (ANCEF) IV 2 g (08/10/23 0535)   PRN Meds:.acetaminophen **OR** acetaminophen, ondansetron **OR** ondansetron (ZOFRAN) IV, senna-docusate Allergies  Allergen Reactions   Augmentin [Amoxicillin-Pot Clavulanate] Hives    Has patient had a PCN reaction causing immediate rash, facial/tongue/throat swelling, SOB or lightheadedness with hypotension: Yes Has patient had a PCN reaction  causing severe rash involving mucus membranes or skin necrosis: No Has patient had a PCN reaction that required hospitalization: No Has patient had a PCN reaction occurring within the last 10 years: No If all of the above answers are "NO", then may proceed with Cephalosporin use.     OBJECTIVE: Vitals:   08/09/23 1504 08/09/23 2019 08/10/23 0456 08/10/23 0715  BP: (!) 119/92 109/67 106/64 100/60  Pulse: 99 80 71 79  Resp: 19 16 18 16   Temp: 98.6 F (37 C) 98.4 F (36.9 C) 98.3 F (36.8 C) 97.7 F (36.5 C)  TempSrc: Oral Oral Oral Oral  SpO2: 100% 100% 100% 99%  Weight:      Height:       Body mass index is 23 kg/m.  Physical Exam Constitutional:      General: He is not in acute distress.    Appearance: He is normal weight. He is not toxic-appearing.  HENT:     Head: Normocephalic and atraumatic.     Right Ear: External ear normal.     Left Ear: External ear normal.     Nose: No congestion or rhinorrhea.     Mouth/Throat:     Mouth: Mucous membranes are moist.     Pharynx: Oropharynx is clear.  Eyes:     Extraocular Movements: Extraocular movements intact.     Conjunctiva/sclera: Conjunctivae normal.     Pupils: Pupils are equal, round, and reactive to light.  Cardiovascular:     Rate and Rhythm: Normal rate and regular rhythm.     Heart sounds: No murmur heard.    No friction  rub. No gallop.  Pulmonary:     Effort: Pulmonary effort is normal.     Breath sounds: Normal breath sounds.  Abdominal:     General: Abdomen is flat. Bowel sounds are normal.     Palpations: Abdomen is soft.  Musculoskeletal:        General: No swelling. Normal range of motion.     Cervical back: Normal range of motion and neck supple.  Skin:    General: Skin is warm and dry.  Neurological:     General: No focal deficit present.     Mental Status: He is oriented to person, place, and time.  Psychiatric:        Mood and Affect: Mood normal.       Lab Results Lab Results   Component Value Date   WBC 4.9 08/07/2023   HGB 12.7 (L) 08/07/2023   HCT 38.3 (L) 08/07/2023   MCV 89.7 08/07/2023   PLT 284 08/07/2023    Lab Results  Component Value Date   CREATININE 0.90 08/07/2023   BUN 17 08/07/2023   NA 137 08/07/2023   K 3.9 08/07/2023   CL 102 08/07/2023   CO2 29 08/07/2023    Lab Results  Component Value Date   ALT 20 08/06/2023   AST 29 08/06/2023   ALKPHOS 58 08/06/2023   BILITOT 0.4 08/06/2023        Danelle Earthly, MD [facility] 08/10/2023, 12:24 PM  I have personally spent 54 minutes involved in face-to-face and non-face-to-face activities for this patient on the day of the visit. Professional time spent includes the following activities: Preparing to see the patient (review of tests), Obtaining and/or reviewing separately obtained history (admission/discharge record), Performing a medically appropriate examination and/or evaluation , Ordering medications/tests/procedures, referring and communicating with other health care professionals, Documenting clinical information in the EMR, Independently interpreting results (not separately reported), Communicating results to the patient/family/caregiver, Counseling and educating the patient/family/caregiver and Care coordination (not separately reported).

## 2023-08-11 DIAGNOSIS — B9561 Methicillin susceptible Staphylococcus aureus infection as the cause of diseases classified elsewhere: Secondary | ICD-10-CM | POA: Diagnosis not present

## 2023-08-11 DIAGNOSIS — R7881 Bacteremia: Secondary | ICD-10-CM | POA: Diagnosis not present

## 2023-08-11 MED ORDER — ALPRAZOLAM 0.25 MG PO TABS
0.2500 mg | ORAL_TABLET | Freq: Three times a day (TID) | ORAL | Status: DC | PRN
  Administered 2023-08-11: 0.25 mg via ORAL
  Filled 2023-08-11: qty 1

## 2023-08-11 NOTE — Plan of Care (Signed)

## 2023-08-11 NOTE — Plan of Care (Signed)

## 2023-08-11 NOTE — Progress Notes (Signed)
PROGRESS NOTE  Trevor [name]  QMV:[id number] DOB: 1980-10-05 DOA: 08/06/2023 PCP: Pcp, No   Brief Narrative: Patient is a 42 year old male with history of bipolar disorder, substance abuse, chronic pain syndrome on methadone, bipolar disorder, hyperlipidemia who was initially seen in the emergency department for the evaluation of  left  lower extremity abscess which was drained and was discharged on oral antibiotics.  Recalled to the emergency department due to positive blood cultures for MSSA which was sent on 11/12.Marland Kitchen  He declined to stay for admission and signed AMA.  Patient presented himself again for admission with intention for treatment of bacteremia.  Currently febrile.  Started on cefazolin.  ID consulted.  No vegetations as per TEE.  Due to his current IV drug use, patient needs to be inpatient for IV antibiotics,end of therapy on 11/28.    Assessment & Plan:  Principal Problem:   MSSA bacteremia Active Problems:   Abscess of left lower extremity   MSSA bacteremia/left lower extremity abscess: Presented initially on 11/12 for complaint of in insect bite on his left knee.  Found to have erythema, swelling and abscess on the medial side of the left knee.  It was drained and he was sent home on oral antibiotics but he was recalled after blood culture sent from 11/12 showed MSSA.  Patient came to the emergency department but signed AMA.  He then presented again. Currently he is afebrile.  No leukocytosis.  Started on cefazolin as per previous blood culture report.  Repeat blood cultures have been sent,NGTD.  ID consulted and following. Abscess around the left knee appears dry and healing well.  Thoracic MRI did not show any finding of discitis or osteomyelitis. TEE showed normal EF, no vegetation. Need 2 weeks of  IV antibiotics on the background of IV drug use.end of therapy on 11/28.   Substance abuse disorder: History of IV drug use.  Takes marijuana, methamphetamine.Last IV drug use  with methamphetamine just few days ago from admission date  Bipolar disorder: Currently not on medications.  We recommend outpatient follow-up with psychiatry  Smoking: Nicotine patch ordered.        DVT prophylaxis:enoxaparin (LOVENOX) injection 40 mg Start: 08/06/23 1900     Code Status: Full Code  Family Communication: None at the bedside  Patient status:Inpatient  Patient is from :home  Anticipated discharge MW:UXLK  Estimated DC date: 11/28   Consultants: ID  Procedures:None  Antimicrobials:  Anti-infectives (From admission, onward)    Start     Dose/Rate Route Frequency Ordered Stop   08/06/23 2200  ceFAZolin (ANCEF) IVPB 2g/100 mL premix        2 g 200 mL/hr over 30 Minutes Intravenous Every 8 hours 08/06/23 2049 08/18/23 2359   08/06/23 1715  ceFAZolin (ANCEF) IVPB 2g/100 mL premix  Status:  Discontinued        2 g 200 mL/hr over 30 Minutes Intravenous  Once 08/06/23 1701 08/06/23 2049       Subjective: Patient seen and examined at bedside today.  Comfortably lying on bed.  Denies any new complaints   Objective: Vitals:   08/10/23 1721 08/10/23 1957 08/11/23 0436 08/11/23 0827  BP: 120/72 105/83 106/64 114/77  Pulse: 87 80 65 94  Resp: 18 20 17 18   Temp: 98.2 F (36.8 C) 98.5 F (36.9 C) 98.3 F (36.8 C) 98.6 F (37 C)  TempSrc: Oral Oral Oral Oral  SpO2: 100% 99% 99% 100%  Weight:      Height:  Intake/Output Summary (Last 24 hours) at 08/11/2023 1247 Last data filed at 08/11/2023 0900 Gross per 24 hour  Intake 1700 ml  Output --  Net 1700 ml    Filed Weights   08/06/23 1543  Weight: 74.8 kg    Examination:   General exam: Overall comfortable, not in distress,thin and deconditioned HEENT: PERRL Respiratory system:  no wheezes or crackles  Cardiovascular system: S1 & S2 heard, RRR.  Gastrointestinal system: Abdomen is nondistended, soft and nontender. Central nervous system: Alert and oriented Extremities: No edema,  no clubbing ,no cyanosis, healing abscess area on the left medial knee Skin: No rashes, no ulcers,no icterus        Data Reviewed: I have personally reviewed following labs and imaging studies  CBC: Recent Labs  Lab 08/05/23 1035 08/06/23 1555 08/07/23 0634  WBC 5.7 6.7 4.9  NEUTROABS 3.8 3.8  --   HGB 12.1* 12.4* 12.7*  HCT 36.5* 37.5* 38.3*  MCV 90.8 89.3 89.7  PLT 272 313 284   Basic Metabolic Panel: Recent Labs  Lab 08/05/23 1035 08/06/23 1555 08/07/23 0634  NA 135 138 137  K 3.5 3.9 3.9  CL 102 101 102  CO2 27 28 29   GLUCOSE 126* 83 123*  BUN 19 10 17   CREATININE 0.83 0.92 0.90  CALCIUM 8.6* 8.9 9.1     Recent Results (from the past 240 hour(s))  Culture, blood (Routine x 2)     Status: Abnormal   Collection Time: 08/02/23 11:30 PM   Specimen: BLOOD  Result Value Ref Range Status   Specimen Description   Final    BLOOD BLOOD LEFT ARM Performed at [facility], [address], [place], [place] [address]    Special Requests   Final    BOTTLES DRAWN AEROBIC ONLY Blood Culture results may not be optimal due to an inadequate volume of blood received in culture bottles Performed at [facility], [address], [place], [place] [address]    Culture  Setup Time   Final    GRAM POSITIVE COCCI IN CLUSTERS AEROBIC BOTTLE ONLY CRITICAL RESULT CALLED TO, READ BACK BY AND VERIFIED WITH: NVivia Ewing RN 08/03/23 @ 2130 BY AB Performed at [facility], [address] [place], [place] [address]    Culture STAPHYLOCOCCUS AUREUS (A)  Final   Report Status 08/05/2023 FINAL  Final   Organism ID, Bacteria STAPHYLOCOCCUS AUREUS  Final      Susceptibility   Staphylococcus aureus - MIC*    CIPROFLOXACIN <=0.5 SENSITIVE Sensitive     ERYTHROMYCIN <=0.25 SENSITIVE Sensitive     GENTAMICIN <=0.5 SENSITIVE Sensitive     OXACILLIN 0.5 SENSITIVE Sensitive     TETRACYCLINE <=1 SENSITIVE Sensitive     VANCOMYCIN 1 SENSITIVE Sensitive      TRIMETH/SULFA <=10 SENSITIVE Sensitive     CLINDAMYCIN <=0.25 SENSITIVE Sensitive     RIFAMPIN <=0.5 SENSITIVE Sensitive     Inducible Clindamycin NEGATIVE Sensitive     LINEZOLID 2 SENSITIVE Sensitive     * STAPHYLOCOCCUS AUREUS  Blood Culture ID Panel (Reflexed)     Status: Abnormal   Collection Time: 08/02/23 11:30 PM  Result Value Ref Range Status   Enterococcus faecalis NOT DETECTED NOT DETECTED Final   Enterococcus Faecium NOT DETECTED NOT DETECTED Final   Listeria monocytogenes NOT DETECTED NOT DETECTED Final   Staphylococcus species DETECTED (A) NOT DETECTED Final    Comment: CRITICAL RESULT CALLED TO, READ BACK BY AND VERIFIED  WITH: N. WHEATLEY RN 08/03/23 @ 2130 BY AB    Staphylococcus aureus (BCID) DETECTED (A) NOT DETECTED Final    Comment: CRITICAL RESULT CALLED TO, READ BACK BY AND VERIFIED WITH: N. WHEATLEY RN 08/03/23 @ 2130 BY AB    Staphylococcus epidermidis NOT DETECTED NOT DETECTED Final   Staphylococcus lugdunensis NOT DETECTED NOT DETECTED Final   Streptococcus species NOT DETECTED NOT DETECTED Final   Streptococcus agalactiae NOT DETECTED NOT DETECTED Final   Streptococcus pneumoniae NOT DETECTED NOT DETECTED Final   Streptococcus pyogenes NOT DETECTED NOT DETECTED Final   A.calcoaceticus-baumannii NOT DETECTED NOT DETECTED Final   Bacteroides fragilis NOT DETECTED NOT DETECTED Final   Enterobacterales NOT DETECTED NOT DETECTED Final   Enterobacter cloacae complex NOT DETECTED NOT DETECTED Final   Escherichia coli NOT DETECTED NOT DETECTED Final   Klebsiella aerogenes NOT DETECTED NOT DETECTED Final   Klebsiella oxytoca NOT DETECTED NOT DETECTED Final   Klebsiella pneumoniae NOT DETECTED NOT DETECTED Final   Proteus species NOT DETECTED NOT DETECTED Final   Salmonella species NOT DETECTED NOT DETECTED Final   Serratia marcescens NOT DETECTED NOT DETECTED Final   Haemophilus influenzae NOT DETECTED NOT DETECTED Final   Neisseria meningitidis NOT  DETECTED NOT DETECTED Final   Pseudomonas aeruginosa NOT DETECTED NOT DETECTED Final   Stenotrophomonas maltophilia NOT DETECTED NOT DETECTED Final   Candida albicans NOT DETECTED NOT DETECTED Final   Candida auris NOT DETECTED NOT DETECTED Final   Candida glabrata NOT DETECTED NOT DETECTED Final   Candida krusei NOT DETECTED NOT DETECTED Final   Candida parapsilosis NOT DETECTED NOT DETECTED Final   Candida tropicalis NOT DETECTED NOT DETECTED Final   Cryptococcus neoformans/gattii NOT DETECTED NOT DETECTED Final   Meth resistant mecA/C and MREJ NOT DETECTED NOT DETECTED Final    Comment: Performed at [facility], [address], [place], [place] [address]  Resp panel by RT-PCR (RSV, Flu A&B, Covid) Anterior Nasal Swab     Status: None   Collection Time: 08/05/23  9:49 AM   Specimen: Anterior Nasal Swab  Result Value Ref Range Status   SARS Coronavirus 2 by RT PCR NEGATIVE NEGATIVE Final    Comment: (NOTE) SARS-CoV-2 target nucleic acids are NOT DETECTED.  The SARS-CoV-2 RNA is generally detectable in upper respiratory specimens during the acute phase of infection. The lowest concentration of SARS-CoV-2 viral copies this assay can detect is 138 copies/mL. A negative result does not preclude SARS-Cov-2 infection and should not be used as the sole basis for treatment or other patient management decisions. A negative result may occur with  improper specimen collection/handling, submission of specimen other than nasopharyngeal swab, presence of viral mutation(s) within the areas targeted by this assay, and inadequate number of viral copies(<138 copies/mL). A negative result must be combined with clinical observations, patient history, and epidemiological information. The expected result is Negative.  Fact Sheet for Patients:  BloggerCourse.com  Fact Sheet for Healthcare Providers:  SeriousBroker.it  This test is no t yet  approved or cleared by the [place] FDA and  has been authorized for detection and/or diagnosis of SARS-CoV-2 by FDA under an Emergency Use Authorization (EUA). This EUA will remain  in effect (meaning this test can be used) for the duration of the COVID-19 declaration under Section 564(b)(1) of the Act, 21 U.S.C.section 360bbb-3(b)(1), unless the authorization is terminated  or revoked sooner.       Influenza A by PCR NEGATIVE NEGATIVE Final   Influenza B by  PCR NEGATIVE NEGATIVE Final    Comment: (NOTE) The Xpert Xpress SARS-CoV-2/FLU/RSV plus assay is intended as an aid in the diagnosis of influenza from Nasopharyngeal swab specimens and should not be used as a sole basis for treatment. Nasal washings and aspirates are unacceptable for Xpert Xpress SARS-CoV-2/FLU/RSV testing.  Fact Sheet for Patients: BloggerCourse.com  Fact Sheet for Healthcare Providers: SeriousBroker.it  This test is not yet approved or cleared by the [place] FDA and has been authorized for detection and/or diagnosis of SARS-CoV-2 by FDA under an Emergency Use Authorization (EUA). This EUA will remain in effect (meaning this test can be used) for the duration of the COVID-19 declaration under Section 564(b)(1) of the Act, 21 U.S.C. section 360bbb-3(b)(1), unless the authorization is terminated or revoked.     Resp Syncytial Virus by PCR NEGATIVE NEGATIVE Final    Comment: (NOTE) Fact Sheet for Patients: BloggerCourse.com  Fact Sheet for Healthcare Providers: SeriousBroker.it  This test is not yet approved or cleared by the [place] FDA and has been authorized for detection and/or diagnosis of SARS-CoV-2 by FDA under an Emergency Use Authorization (EUA). This EUA will remain in effect (meaning this test can be used) for the duration of the COVID-19 declaration under Section 564(b)(1) of  the Act, 21 U.S.C. section 360bbb-3(b)(1), unless the authorization is terminated or revoked.  Performed at [facility], [address] [place], [place] [address]   Blood Culture (routine x 2)     Status: None   Collection Time: 08/05/23 10:30 AM   Specimen: BLOOD  Result Value Ref Range Status   Specimen Description   Final    BLOOD SITE NOT SPECIFIED Performed at [facility], [address] [place], [place] [address]    Special Requests   Final    BOTTLES DRAWN AEROBIC AND ANAEROBIC Blood Culture results may not be optimal due to an excessive volume of blood received in culture bottles Performed at [facility], [address], [place], [place] [address]    Culture   Final    NO GROWTH 5 DAYS Performed at [facility], [address], [place], [place] [address]    Report Status 08/10/2023 FINAL  Final  Blood Culture (routine x 2)     Status: None   Collection Time: 08/05/23 10:30 AM   Specimen: BLOOD  Result Value Ref Range Status   Specimen Description   Final    BLOOD SITE NOT SPECIFIED Performed at [facility], [address], [place], [place] [address]    Special Requests   Final    BOTTLES DRAWN AEROBIC AND ANAEROBIC Blood Culture results may not be optimal due to an excessive volume of blood received in culture bottles Performed at [facility], [address] [place], [place] [address]    Culture   Final    NO GROWTH 5 DAYS Performed at [facility], [address], [place], [place] [address]    Report Status 08/10/2023 FINAL  Final     Radiology Studies: No results found.  Scheduled Meds:  enoxaparin (LOVENOX) injection  40 mg Subcutaneous Q24H   Continuous Infusions:   ceFAZolin (ANCEF) IV 2 g (08/11/23 0804)     LOS: 5 days   Burnadette Pop, MD [facility] P11/21/2024, 12:47 PM

## 2023-08-11 NOTE — Progress Notes (Signed)
Patient refused to wake up and extend arm for antibiotic infusion. Patient would not respond to his name and when this nurse spoke to him he would take a deep breath and blow it. Antibiotic rescheduled to later in the morning. Will pass on to day shift nurse.

## 2023-08-11 NOTE — TOC Progression Note (Signed)
Transition of Care [facility]) - Progression Note    Patient Details  Name: Trevor [name] MRN: [id number] Date of Birth: [birth year]  Transition of [facility]) CM/SW Contact  Carley Hammed, LCSW Phone Number: 08/11/2023, 1:04 PM  Clinical Narrative:    Per MD notes, pt will remain inpatient to receive IV ABX until 11/28. Assessment completed, TOC will continue to follow for any additional needs.         Expected Discharge Plan and Services                                               Social Determinants of Health (SDOH) Interventions SDOH Screenings   Food Insecurity: Food Insecurity Present (08/05/2023)  Housing: Patient Declined (08/05/2023)  Transportation Needs: Patient Declined (08/06/2023)  Recent Concern: Transportation Needs - Unmet Transportation Needs (08/05/2023)  Utilities: At Risk (08/05/2023)  Alcohol Screen: Low Risk  (07/24/2018)  Tobacco Use: High Risk (08/09/2023)    Readmission Risk Interventions     No data to display

## 2023-08-12 DIAGNOSIS — B9561 Methicillin susceptible Staphylococcus aureus infection as the cause of diseases classified elsewhere: Secondary | ICD-10-CM | POA: Diagnosis not present

## 2023-08-12 DIAGNOSIS — F1910 Other psychoactive substance abuse, uncomplicated: Secondary | ICD-10-CM

## 2023-08-12 DIAGNOSIS — R7881 Bacteremia: Secondary | ICD-10-CM | POA: Diagnosis not present

## 2023-08-12 DIAGNOSIS — L02416 Cutaneous abscess of left lower limb: Secondary | ICD-10-CM | POA: Diagnosis not present

## 2023-08-12 DIAGNOSIS — Z720 Tobacco use: Secondary | ICD-10-CM | POA: Diagnosis not present

## 2023-08-12 DIAGNOSIS — F315 Bipolar disorder, current episode depressed, severe, with psychotic features: Secondary | ICD-10-CM

## 2023-08-12 MED ORDER — PANTOPRAZOLE SODIUM 40 MG PO TBEC
40.0000 mg | DELAYED_RELEASE_TABLET | Freq: Every day | ORAL | Status: DC
  Administered 2023-08-12 – 2023-08-18 (×7): 40 mg via ORAL
  Filled 2023-08-12 (×7): qty 1

## 2023-08-12 MED ORDER — LORAZEPAM 1 MG PO TABS
1.0000 mg | ORAL_TABLET | Freq: Four times a day (QID) | ORAL | Status: DC | PRN
  Administered 2023-08-13 – 2023-08-14 (×2): 1 mg via ORAL
  Filled 2023-08-12 (×2): qty 1

## 2023-08-12 NOTE — Progress Notes (Signed)
[facility]  Date of Admission:  08/06/2023   Total days of inpatient antibiotics 5  Principal Problem:   Abscess of left lower extremity Active Problems:   Bipolar I disorder, current or most recent episode depressed, with psychotic features (HCC)   Tobacco abuse   Polysubstance abuse [facility])          Assessment: 42 year old male with history of lumbar fusion and no hardware per report IVDA, bipolar disorder male with: #MSSA bacteremia Likely 2/2 left knee infection #IVDA - Initially left AMA on 11/12 and readmitted with positive blood cultures on 11/15.  Repeat blood culture 11/15 no growth so far - He had reported some low back pain, MRI T-spine negative for infection. - Source likely left knee as there is an abscess with associated cellulitis. Only small knee joint effusion on x-ray. - TTE and TEE without vegetation  Recommendations: -Continue cefazolin. No new complaints from pt. - Follow repeat blood cultures on 11/15, plan on 2 weeks of antibiotics from negative cultures EOT 11/28.  Patient today,  amenable to staying for the hospitalization complete antibiotic course - He is interested in drug rehab counseling. - Given IVDA patient is not a candidate for PICC line for home antibiotics.   Microbiology:   Antibiotics: Cefazolin 11/15-present Cultures: Blood 11/12 1/1 MSSA 11/15 no growth 6x3 Urine  Other   SUBJECTIVE: Resting in bed.  Interval: Afebrile overnight.  WBC 4.9K.  Review of Systems: Review of Systems  All other systems reviewed and are negative.    Scheduled Meds:  enoxaparin (LOVENOX) injection  40 mg Subcutaneous Q24H   pantoprazole  40 mg Oral Daily   Continuous Infusions:   ceFAZolin (ANCEF) IV 2 g (08/12/23 2126)   PRN Meds:.acetaminophen **OR** acetaminophen, LORazepam, ondansetron **OR** ondansetron (ZOFRAN) IV, senna-docusate Allergies  Allergen Reactions   Augmentin [Amoxicillin-Pot  Clavulanate] Hives    Has patient had a PCN reaction causing immediate rash, facial/tongue/throat swelling, SOB or lightheadedness with hypotension: Yes Has patient had a PCN reaction causing severe rash involving mucus membranes or skin necrosis: No Has patient had a PCN reaction that required hospitalization: No Has patient had a PCN reaction occurring within the last 10 years: No If all of the above answers are "NO", then may proceed with Cephalosporin use.     OBJECTIVE: Vitals:   08/12/23 0529 08/12/23 0737 08/12/23 1548 08/12/23 2112  BP: 102/66 107/75 117/81 108/80  Pulse: 65 80 (!) 106 92  Resp: 18 16 16 19   Temp: 97.6 F (36.4 C) 99 F (37.2 C) 97.7 F (36.5 C) 98.2 F (36.8 C)  TempSrc: Oral Oral Oral Oral  SpO2: 100% 99% 100% 97%  Weight:      Height:       Body mass index is 23 kg/m.  Physical Exam Constitutional:      General: He is not in acute distress.    Appearance: He is normal weight. He is not toxic-appearing.  HENT:     Head: Normocephalic and atraumatic.     Right Ear: External ear normal.     Left Ear: External ear normal.     Nose: No congestion or rhinorrhea.     Mouth/Throat:     Mouth: Mucous membranes are moist.     Pharynx: Oropharynx is clear.  Eyes:     Extraocular Movements: Extraocular movements intact.     Conjunctiva/sclera: Conjunctivae normal.     Pupils: Pupils are equal, round,  and reactive to light.  Cardiovascular:     Rate and Rhythm: Normal rate and regular rhythm.     Heart sounds: No murmur heard.    No friction rub. No gallop.  Pulmonary:     Effort: Pulmonary effort is normal.     Breath sounds: Normal breath sounds.  Abdominal:     General: Abdomen is flat. Bowel sounds are normal.     Palpations: Abdomen is soft.  Musculoskeletal:        General: No swelling. Normal range of motion.     Cervical back: Normal range of motion and neck supple.  Skin:    General: Skin is warm and dry.  Neurological:     General:  No focal deficit present.     Mental Status: He is oriented to person, place, and time.  Psychiatric:        Mood and Affect: Mood normal.       Lab Results Lab Results  Component Value Date   WBC 4.9 08/07/2023   HGB 12.7 (L) 08/07/2023   HCT 38.3 (L) 08/07/2023   MCV 89.7 08/07/2023   PLT 284 08/07/2023    Lab Results  Component Value Date   CREATININE 0.90 08/07/2023   BUN 17 08/07/2023   NA 137 08/07/2023   K 3.9 08/07/2023   CL 102 08/07/2023   CO2 29 08/07/2023    Lab Results  Component Value Date   ALT 20 08/06/2023   AST 29 08/06/2023   ALKPHOS 58 08/06/2023   BILITOT 0.4 08/06/2023        Danelle Earthly, MD [facility] 08/12/2023, 10:35 PM  I have personally spent 35 minutes involved in face-to-face and non-face-to-face activities for this patient on the day of the visit. Professional time spent includes the following activities: Preparing to see the patient (review of tests), Obtaining and/or reviewing separately obtained history (admission/discharge record), Performing a medically appropriate examination and/or evaluation , Ordering medications/tests/procedures, referring and communicating with other health care professionals, Documenting clinical information in the EMR, Independently interpreting results (not separately reported), Communicating results to the patient/family/caregiver, Counseling and educating the patient/family/caregiver and Care coordination (not separately reported).

## 2023-08-12 NOTE — Assessment & Plan Note (Signed)
Smoking cessation

## 2023-08-12 NOTE — Assessment & Plan Note (Addendum)
MSSA bacteremia. Further work up with transesophageal echocardiogram showed no vegetations.  LV systolic function preserved 55 to 60%, ejection fraction. RV systolic function preserved. No left atrial or left atrial appendage thrombus detected.  No significant valvular abnormalities.  No evidence of any interatrial shunt.   Patient was treated with IV cefazolin with good toleration.  Because history of intravenous substance abuse history he was not candidate for outpatient antibiotic therapy.   Follow up blood cultures from 11/15 have no growth.  Patient has been advices to follow up as outpatient and avoid substance abuse.

## 2023-08-12 NOTE — Hospital Course (Signed)
Mr. Conteh was admitted to the hospital with the working diagnosis of MSSA bacteremia in the setting of left  upper extremity cellulitis.   42 year old male with history of bipolar disorder, substance abuse, chronic pain syndrome on methadone, bipolar disorder, hyperlipidemia who was initially seen in the emergency department om 08/02/23 for the evaluation of  left  lower extremity abscess which was drained and he was discharged on oral antibiotic therapy.  His blood cultures resulted positive for MSSA.  11/15 Police did a wellness visit, and brought patient back to the ED to treat his bacteremia.  At 22:15 on 11/15 patient was found unresponsive required naloxone. When he woke up he as agitated and eventually declined to be admitted and left the hospital against medial advice. Patient returned to the ED on 11/16 with the intention to be treated for his bacteremia.  On his initial physical examination his blood pressure was 126/80, HR 99, RR 13 and 02 saturation 100%, lungs with no wheezing or rales, heart with S1 and S2 present and regular, abdomen with no distention and no lower extremity edema.  Left lower extremity with small abscess medial to the knee with granulation tissue in the middle with an surrounding tenderness and erythema. Mild fluctuance but not drainage.   Na 138, K 3,9 Cl 101 bicarbonate 28, glucose 83 bun 10 cr 0,92 Lactic acid 0,3 Wbc 6,7 hgb 12.4 plt 313  Urine analysis with SG 1,014 with no protein, negative leukocyte and negative hgb.   MRI thoracic spine with no evidence of thoracic spine infection.  Mild thoracic disc degeneration without stenosis.  Mild spine stenosis and mid to moderate right neural foraminal stenosis at C6 -C7.   EKG 97 normal axis, normal intervals, sinus rhythm with no significant ST segment or T wave changes. Positive LVH criteria per EKG.   Started on cefazolin.  ID consulted.   No vegetations as per TEE.  Due to his current IV drug use, patient  needs to be inpatient for IV antibiotics,end of therapy on 11/28.    11/28 patient has completed his antibiotic therapy and plan to discharge home with follow up with primary care. Referral for behavioral health for substance abuse.

## 2023-08-12 NOTE — Assessment & Plan Note (Signed)
Substance abuse counseling.   Will need outpatient follow up.

## 2023-08-12 NOTE — Assessment & Plan Note (Addendum)
Patient on alprazolam 0.25 mg tid prn, but reports uncontrolled anxiety. Not reported to be given.  Considering his possible tolerance to anxiolytics, will change to lorazepam 1 mg as needed every 6 hrs.

## 2023-08-12 NOTE — Progress Notes (Signed)
  Progress Note   Patient: Trevor [name] QIO:[id number] DOB: 02-21-1981 DOA: 08/06/2023     6 DOS: the patient was seen and examined on 08/12/2023   Brief hospital course: Trevor [name] was admitted to the hospital with the working diagnosis of MSSA bacteremia in the setting of left  upper extremity cellulitis.   42 year old male with history of bipolar disorder, substance abuse, chronic pain syndrome on methadone, bipolar disorder, hyperlipidemia who was initially seen in the emergency department for the evaluation of  left  lower extremity abscess which was drained and was discharged on oral antibiotics.  Recalled to the emergency department due to positive blood cultures for MSSA which was sent on 11/12.Marland Kitchen  He declined to stay for admission and signed AMA.  Patient presented himself again for admission with intention for treatment of bacteremia.   Started on cefazolin.  ID consulted.   No vegetations as per TEE.  Due to his current IV drug use, patient needs to be inpatient for IV antibiotics,end of therapy on 11/28.    Assessment and Plan: * Abscess of left lower extremity MSSA bacteremia.  Plan to continue IV antibiotic therapy with cefazolin.  Plan to complete therapy on 08/18/23.   Bipolar I disorder, current or most recent episode depressed, with psychotic features (HCC) Patient on alprazolam 0.25 mg tid prn, but reports uncontrolled anxiety. Not reported to be given.  Considering his possible tolerance to anxiolytics, will change to lorazepam 1 mg as needed every 6 hrs.    Polysubstance abuse (HCC) Substance abuse counseling.   Tobacco abuse Smoking cessation          Subjective: Patient reports anxiety, uncontrolled, no chest pain or dyspnea, no nausea or vomiting.   Physical Exam: Vitals:   08/11/23 1931 08/12/23 0529 08/12/23 0737 08/12/23 1548  BP: 112/85 102/66 107/75 117/81  Pulse: 98 65 80 (!) 106  Resp: 16 18 16 16   Temp: 97.8 F (36.6 C) 97.6 F (36.4 C) 99  F (37.2 C) 97.7 F (36.5 C)  TempSrc: Oral Oral Oral Oral  SpO2: 99% 100% 99% 100%  Weight:      Height:       Neurology awake and alert ENT with mild pallor Cardiovascular with S1 and S2 present and regular Respiratory with no wheezing or rhonchi Abdomen with no distention  No lower extremity edema  Data Reviewed:    Family Communication: no family at the bedside   Disposition: Status is: Inpatient Remains inpatient appropriate because: IV antibiotic therapy   Planned Discharge Destination: Home    Author: Coralie Keens, MD 08/12/2023 5:00 PM  For on call review [email or web address].

## 2023-08-13 DIAGNOSIS — F315 Bipolar disorder, current episode depressed, severe, with psychotic features: Secondary | ICD-10-CM | POA: Diagnosis not present

## 2023-08-13 DIAGNOSIS — F1910 Other psychoactive substance abuse, uncomplicated: Secondary | ICD-10-CM | POA: Diagnosis not present

## 2023-08-13 DIAGNOSIS — Z720 Tobacco use: Secondary | ICD-10-CM | POA: Diagnosis not present

## 2023-08-13 DIAGNOSIS — L02416 Cutaneous abscess of left lower limb: Secondary | ICD-10-CM | POA: Diagnosis not present

## 2023-08-13 NOTE — Progress Notes (Signed)
  Progress Note   Patient: Trevor [name]:[id number] DOB: Sep 22, 1980 DOA: 08/06/2023     7 DOS: the patient was seen and examined on 08/13/2023   Brief hospital course: Mr. Risper was admitted to the hospital with the working diagnosis of MSSA bacteremia in the setting of left  upper extremity cellulitis.   42 year old male with history of bipolar disorder, substance abuse, chronic pain syndrome on methadone, bipolar disorder, hyperlipidemia who was initially seen in the emergency department for the evaluation of  left  lower extremity abscess which was drained and was discharged on oral antibiotics.  Recalled to the emergency department due to positive blood cultures for MSSA which was sent on 11/12.Marland Kitchen  He declined to stay for admission and signed AMA.  Patient presented himself again for admission with intention for treatment of bacteremia.   Started on cefazolin.  ID consulted.   No vegetations as per TEE.  Due to his current IV drug use, patient needs to be inpatient for IV antibiotics,end of therapy on 11/28.    Assessment and Plan: * Abscess of left lower extremity MSSA bacteremia.  Plan to continue IV antibiotic therapy with cefazolin.  Plan to complete therapy on 08/18/23.   Bipolar I disorder, current or most recent episode depressed, with psychotic features (HCC) Improved anxiety with as needed oral lorazepam.    Polysubstance abuse (HCC) Substance abuse counseling.   Tobacco abuse Smoking cessation          Subjective: Patient with improved anxiety, no chest pain and no dyspnea. No nausea or vomiting, no diarrhea.   Physical Exam: Vitals:   08/12/23 2112 08/13/23 0345 08/13/23 0354 08/13/23 1557  BP: 108/80  116/82 108/65  Pulse: 92  93 92  Resp: 19  19 18   Temp:  99 F (37.2 C) 97.8 F (36.6 C) 98.8 F (37.1 C)  TempSrc: Oral Oral Oral Oral  SpO2: 97%  97% 100%  Weight:      Height:       Neurology awake and alert ENT with no pallor Cardiovascular  with S1 and S2 present and regular Respiratory with no wheezing Abdomen with no distention  No lower extremity edema  Data Reviewed:    Family Communication: no family at the beside   Disposition: Status is: Inpatient Remains inpatient appropriate because: pending to complete IV antibiotic therapy on 11.28   Planned Discharge Destination: Home    Author: Coralie Keens, MD 08/13/2023 4:31 PM  For on call review [email or web address].

## 2023-08-13 NOTE — Plan of Care (Signed)
  Problem: Clinical Measurements: Goal: Will remain free from infection Outcome: Progressing   Problem: Clinical Measurements: Goal: Cardiovascular complication will be avoided Outcome: Progressing   Problem: Nutrition: Goal: Adequate nutrition will be maintained Outcome: Progressing   Problem: Coping: Goal: Level of anxiety will decrease Outcome: Progressing

## 2023-08-14 DIAGNOSIS — F1910 Other psychoactive substance abuse, uncomplicated: Secondary | ICD-10-CM | POA: Diagnosis not present

## 2023-08-14 DIAGNOSIS — L02416 Cutaneous abscess of left lower limb: Secondary | ICD-10-CM | POA: Diagnosis not present

## 2023-08-14 DIAGNOSIS — F315 Bipolar disorder, current episode depressed, severe, with psychotic features: Secondary | ICD-10-CM | POA: Diagnosis not present

## 2023-08-14 DIAGNOSIS — Z720 Tobacco use: Secondary | ICD-10-CM | POA: Diagnosis not present

## 2023-08-14 MED ORDER — LORAZEPAM 1 MG PO TABS
1.0000 mg | ORAL_TABLET | ORAL | Status: DC | PRN
  Administered 2023-08-14 – 2023-08-18 (×10): 1 mg via ORAL
  Filled 2023-08-14 (×10): qty 1

## 2023-08-14 NOTE — Progress Notes (Signed)
PIV catheter to RAC found without extension tubing attached. "fell out in the shower" per patient. RN notified. PIV catheter removed. Site dressed with gauze and secured with tape.

## 2023-08-14 NOTE — Plan of Care (Signed)
  Problem: Education: Goal: Knowledge of General Education information will improve Description: Including pain rating scale, medication(s)/side effects and non-pharmacologic comfort measures Outcome: Progressing   Problem: Health Behavior/Discharge Planning: Goal: Ability to manage health-related needs will improve Outcome: Progressing   Problem: Activity: Goal: Risk for activity intolerance will decrease Outcome: Progressing   

## 2023-08-14 NOTE — Plan of Care (Signed)
  Problem: Education: Goal: Knowledge of General Education information will improve Description: Including pain rating scale, medication(s)/side effects and non-pharmacologic comfort measures 08/14/2023 0702 by Aldean Baker, RN Outcome: Progressing 08/14/2023 0701 by Aldean Baker, RN Outcome: Progressing   Problem: Clinical Measurements: Goal: Ability to maintain clinical measurements within normal limits will improve 08/14/2023 0702 by Aldean Baker, RN Outcome: Progressing 08/14/2023 0701 by Frances Furbish T, RN Outcome: Progressing Goal: Will remain free from infection 08/14/2023 0702 by Aldean Baker, RN Outcome: Progressing 08/14/2023 0701 by Aldean Baker, RN Outcome: Progressing Goal: Diagnostic test results will improve 08/14/2023 0702 by Aldean Baker, RN Outcome: Progressing 08/14/2023 0701 by Frances Furbish T, RN Outcome: Progressing Goal: Respiratory complications will improve 08/14/2023 0702 by Aldean Baker, RN Outcome: Progressing 08/14/2023 0701 by Frances Furbish T, RN Outcome: Progressing Goal: Cardiovascular complication will be avoided 08/14/2023 0702 by Aldean Baker, RN Outcome: Progressing 08/14/2023 0701 by Aldean Baker, RN Outcome: Progressing

## 2023-08-14 NOTE — Progress Notes (Signed)
  Progress Note   Patient: Trevor [name] [id number]:[id number] DOB: 1981/04/07 DOA: 08/06/2023     8 DOS: the patient was seen and examined on 08/14/2023   Brief hospital course: Mr. Langford was admitted to the hospital with the working diagnosis of MSSA bacteremia in the setting of left  upper extremity cellulitis.   42 year old male with history of bipolar disorder, substance abuse, chronic pain syndrome on methadone, bipolar disorder, hyperlipidemia who was initially seen in the emergency department for the evaluation of  left  lower extremity abscess which was drained and was discharged on oral antibiotics.  Recalled to the emergency department due to positive blood cultures for MSSA which was sent on 11/12.Marland Kitchen  He declined to stay for admission and signed AMA.  Patient presented himself again for admission with intention for treatment of bacteremia.   Started on cefazolin.  ID consulted.   No vegetations as per TEE.  Due to his current IV drug use, patient needs to be inpatient for IV antibiotics,end of therapy on 11/28.    Assessment and Plan: * Abscess of left lower extremity MSSA bacteremia.  Plan to continue IV antibiotic therapy with cefazolin.  Plan to complete therapy on 08/18/23.   Bipolar I disorder, current or most recent episode depressed, with psychotic features (HCC) Improved anxiety with as needed oral lorazepam.    Polysubstance abuse (HCC) Substance abuse counseling.   Tobacco abuse Smoking cessation          Subjective: Patient with no chest pan or dyspnea, anxiety improved with lorazepam.   Physical Exam: Vitals:   08/13/23 1948 08/14/23 0357 08/14/23 0910 08/14/23 1450  BP: 130/78 119/81 127/87 (!) 131/93  Pulse: 94 78 92 92  Resp: 16 16  15   Temp: 98.3 F (36.8 C) 98.4 F (36.9 C) 97.8 F (36.6 C) 98.4 F (36.9 C)  TempSrc: Oral  Oral Oral  SpO2: 100% 100% 94% 98%  Weight:      Height:       Neurology awake and alert ENT with no pallor Respiratory  with no wheezing Heart with S1 and S2  Abdomen with no distention  No lower extremity edema  Data Reviewed:    Family Communication: no family at the bedside   Disposition: Status is: Inpatient Remains inpatient appropriate because: pending to complete IV antibiotic therapy on 11.28   Planned Discharge Destination: Home     Author: Coralie Keens, MD 08/14/2023 3:16 PM  For on call review [email or web address].

## 2023-08-15 DIAGNOSIS — F315 Bipolar disorder, current episode depressed, severe, with psychotic features: Secondary | ICD-10-CM | POA: Diagnosis not present

## 2023-08-15 DIAGNOSIS — E875 Hyperkalemia: Secondary | ICD-10-CM

## 2023-08-15 DIAGNOSIS — F1910 Other psychoactive substance abuse, uncomplicated: Secondary | ICD-10-CM | POA: Diagnosis not present

## 2023-08-15 DIAGNOSIS — Z720 Tobacco use: Secondary | ICD-10-CM | POA: Diagnosis not present

## 2023-08-15 DIAGNOSIS — L02416 Cutaneous abscess of left lower limb: Secondary | ICD-10-CM | POA: Diagnosis not present

## 2023-08-15 LAB — BASIC METABOLIC PANEL
Anion gap: 12 (ref 5–15)
BUN: 27 mg/dL — ABNORMAL HIGH (ref 6–20)
CO2: 21 mmol/L — ABNORMAL LOW (ref 22–32)
Calcium: 9.3 mg/dL (ref 8.9–10.3)
Chloride: 102 mmol/L (ref 98–111)
Creatinine, Ser: 0.76 mg/dL (ref 0.61–1.24)
GFR, Estimated: >60 mL/min (ref >60)
Glucose, Bld: 94 mg/dL (ref 70–99)
Potassium: 5.6 mmol/L — ABNORMAL HIGH (ref 3.5–5.1)
Sodium: 135 mmol/L (ref 135–145)

## 2023-08-15 LAB — CBC
HCT: 43.4 % (ref 39.0–52.0)
Hemoglobin: 14.5 g/dL (ref 13.0–17.0)
MCH: 30 pg (ref 26.0–34.0)
MCHC: 33.4 g/dL (ref 30.0–36.0)
MCV: 89.9 fL (ref 80.0–100.0)
Platelets: 262 10*3/uL (ref 150–400)
RBC: 4.83 MIL/uL (ref 4.22–5.81)
RDW: 13.8 % (ref 11.5–15.5)
WBC: 10.3 10*3/uL (ref 4.0–10.5)
nRBC: 0 % (ref 0.0–0.2)

## 2023-08-15 LAB — POTASSIUM: Potassium: 4.1 mmol/L (ref 3.5–5.1)

## 2023-08-15 NOTE — TOC Progression Note (Signed)
Transition of Care [facility]) - Progression Note    Patient Details  Name: MARICO GENERAL MRN: [id number] Date of Birth: [birth year]  [facility]) CM/SW Contact  Carley Hammed, LCSW Phone Number: 08/15/2023, 12:42 PM  Clinical Narrative:    TOC continues to follow to address any barriers or needs. Plan continues to be for pt to DC after 11/28 Abx. Pt ambulating around the unit independently. TOC will continue to be available.        Expected Discharge Plan and Services                                               Social Determinants of Health (SDOH) Interventions SDOH Screenings   Food Insecurity: Food Insecurity Present (08/05/2023)  Housing: Patient Declined (08/05/2023)  Transportation Needs: Patient Declined (08/06/2023)  Recent Concern: Transportation Needs - Unmet Transportation Needs (08/05/2023)  Utilities: At Risk (08/05/2023)  Alcohol Screen: Low Risk  (07/24/2018)  Tobacco Use: High Risk (08/09/2023)    Readmission Risk Interventions     No data to display

## 2023-08-15 NOTE — Progress Notes (Signed)
Due to multiple IV's removed. I informed pt that he needs to call the nurse to disconnect the IV when infusion is complete or to take a shower in order for IV to be wrapped prior to getting in the shower. I also informed pt that if he doesn't comply and if there are any signs of possible drug use that we will call security to search his belongings. Pt stated, " I don't have a problem with them searching my bag if they ask me." Also, pt stated, " I do not have any drugs and wouldn't put anything in my IV." I reiterated that he cannot use any illicit drugs of any kind not just IV drugs. Pt verbally agreed to comply with everything discussed.

## 2023-08-15 NOTE — Plan of Care (Signed)

## 2023-08-15 NOTE — Progress Notes (Signed)
Pt's right upper arm IV was infiltrated. IV was removed and pharmacy was notified per protocol. Pt has a second IV that was intact and patent in the left [facility] but slightly leaky with continuous fluid and patient does not want the antibiotics attached to the IV. Patient is a difficult stick, so the AC IV  was left in place until another IV would be started. IV team was consulted for a new IV. Upon their arrival, IV team reported that the IV in place does not have a connector attached to it, to which the patient reported that it fell out at the shower. Pt did not inform this RN of the incidence. IV team removed the IV and started another IV.

## 2023-08-15 NOTE — Progress Notes (Addendum)
  Progress Note   Patient: Trevor [name] [id number]:[id number] DOB: December 26, 1980 DOA: 08/06/2023     9 DOS: the patient was seen and examined on 08/15/2023   Brief hospital course: Mr. Kohring was admitted to the hospital with the working diagnosis of MSSA bacteremia in the setting of left  upper extremity cellulitis.   42 year old male with history of bipolar disorder, substance abuse, chronic pain syndrome on methadone, bipolar disorder, hyperlipidemia who was initially seen in the emergency department for the evaluation of  left  lower extremity abscess which was drained and was discharged on oral antibiotics.  Recalled to the emergency department due to positive blood cultures for MSSA which was sent on 11/12.Marland Kitchen  He declined to stay for admission and signed AMA.  Patient presented himself again for admission with intention for treatment of bacteremia.   Started on cefazolin.  ID consulted.   No vegetations as per TEE.  Due to his current IV drug use, patient needs to be inpatient for IV antibiotics,end of therapy on 11/28.    Assessment and Plan: * Abscess of left lower extremity MSSA bacteremia.  Plan to continue IV antibiotic therapy with cefazolin.  Plan to complete therapy on 08/18/23.   Bipolar I disorder, current or most recent episode depressed, with psychotic features (HCC) Improved anxiety with as needed oral lorazepam.    Polysubstance abuse (HCC) Substance abuse counseling.   Noted multiple IV's removed,  He has alerted not to manipulate IV's, and follow protocol to protect IV.  If suspicious activity will have security search into his belongings any possible illicit drugs.   Tobacco abuse Smoking cessation    Hyperkalemia K is 5,6 with normal renal function.  Will re check K this pm.        Subjective: Patient is feeling well, no chest pain or dyspnea.   Physical Exam: Vitals:   08/15/23 0346 08/15/23 0734 08/15/23 0755 08/15/23 1453  BP: 131/84 (!) 108/58 116/63  120/81  Pulse: 89 77 72 (!) 104  Resp: 19 18 18 19   Temp: 97.9 F (36.6 C) 98.2 F (36.8 C) 97.7 F (36.5 C) 98.2 F (36.8 C)  TempSrc: Oral  Oral Oral  SpO2: 100% 100% 100% 97%  Weight:      Height:       Neurology awake and alert Not in distress.   Data Reviewed:    Family Communication: no family at the bedside   Disposition: Status is: Inpatient Remains inpatient appropriate because: pending completion of antibiotic therapy   Planned Discharge Destination: Home      Author: Coralie Keens, MD 08/15/2023 4:04 PM  For on call review [email or web address].

## 2023-08-16 DIAGNOSIS — F315 Bipolar disorder, current episode depressed, severe, with psychotic features: Secondary | ICD-10-CM | POA: Diagnosis not present

## 2023-08-16 DIAGNOSIS — Z720 Tobacco use: Secondary | ICD-10-CM | POA: Diagnosis not present

## 2023-08-16 DIAGNOSIS — F1910 Other psychoactive substance abuse, uncomplicated: Secondary | ICD-10-CM | POA: Diagnosis not present

## 2023-08-16 DIAGNOSIS — L02416 Cutaneous abscess of left lower limb: Secondary | ICD-10-CM | POA: Diagnosis not present

## 2023-08-16 NOTE — Progress Notes (Signed)
  Progress Note   Patient: Trevor [name] WUJ:[id number] DOB: [birth year] DOA: 08/06/2023     10 DOS: the patient was seen and examined on 08/16/2023   Brief hospital course: Trevor [name] was admitted to the hospital with the working diagnosis of MSSA bacteremia in the setting of left  upper extremity cellulitis.   42 year old male with history of bipolar disorder, substance abuse, chronic pain syndrome on methadone, bipolar disorder, hyperlipidemia who was initially seen in the emergency department for the evaluation of  left  lower extremity abscess which was drained and was discharged on oral antibiotics.  Recalled to the emergency department due to positive blood cultures for MSSA which was sent on 11/12.Marland Kitchen  He declined to stay for admission and signed AMA.  Patient presented himself again for admission with intention for treatment of bacteremia.   Started on cefazolin.  ID consulted.   No vegetations as per TEE.  Due to his current IV drug use, patient needs to be inpatient for IV antibiotics,end of therapy on 11/28.    Assessment and Plan: * Abscess of left lower extremity MSSA bacteremia.  Plan to continue IV antibiotic therapy with cefazolin.  Plan to complete therapy on 08/18/23.   Bipolar I disorder, current or most recent episode depressed, with psychotic features (HCC) Improved anxiety with as needed oral lorazepam.    Polysubstance abuse (HCC) Substance abuse counseling.   Noted multiple IV's removed,  He has alerted not to manipulate IV's, and follow protocol to protect IV.  If suspicious activity will have security search into his belongings any possible illicit drugs.   Tobacco abuse Smoking cessation     Ruled out hyperkalemia with a repeat K at 4,1      Subjective: Patient with no chest pain or dyspnea   Physical Exam: Vitals:   08/15/23 1453 08/15/23 1945 08/16/23 0419 08/16/23 0757  BP: 120/81 114/73 115/77 135/89  Pulse: (!) 104 86 96 (!) 103  Resp: 19 16  19 19   Temp: 98.2 F (36.8 C) 99 F (37.2 C) 98.8 F (37.1 C) 97.7 F (36.5 C)  TempSrc: Oral Oral Oral Oral  SpO2: 97% 100% 99% 100%  Weight:      Height:       Patient not in distress.   Data Reviewed:    Family Communication: no family at the bedside   Disposition: Status is: Inpatient Remains inpatient appropriate because: pending to complete IV antibiotic therapy for bacteremia.   Planned Discharge Destination: Home     Author: Coralie Keens, MD 08/16/2023 4:47 PM  For on call review [email or web address].

## 2023-08-17 DIAGNOSIS — Z720 Tobacco use: Secondary | ICD-10-CM | POA: Diagnosis not present

## 2023-08-17 DIAGNOSIS — L02416 Cutaneous abscess of left lower limb: Secondary | ICD-10-CM | POA: Diagnosis not present

## 2023-08-17 DIAGNOSIS — F315 Bipolar disorder, current episode depressed, severe, with psychotic features: Secondary | ICD-10-CM | POA: Diagnosis not present

## 2023-08-17 DIAGNOSIS — F1910 Other psychoactive substance abuse, uncomplicated: Secondary | ICD-10-CM | POA: Diagnosis not present

## 2023-08-17 NOTE — Plan of Care (Signed)
?  Problem: Clinical Measurements: ?Goal: Diagnostic test results will improve ?Outcome: Progressing ?  ?Problem: Nutrition: ?Goal: Adequate nutrition will be maintained ?Outcome: Progressing ?  ?Problem: Coping: ?Goal: Level of anxiety will decrease ?Outcome: Progressing ?  ?Problem: Safety: ?Goal: Ability to remain free from injury will improve ?Outcome: Progressing ?  ?

## 2023-08-17 NOTE — Progress Notes (Signed)
  Progress Note   Patient: Trevor [name] [id number]:[id number] DOB: [birth year] DOA: 08/06/2023     11 DOS: the patient was seen and examined on 08/17/2023   Brief hospital course: Mr. Geske was admitted to the hospital with the working diagnosis of MSSA bacteremia in the setting of left  upper extremity cellulitis.   42 year old male with history of bipolar disorder, substance abuse, chronic pain syndrome on methadone, bipolar disorder, hyperlipidemia who was initially seen in the emergency department for the evaluation of  left  lower extremity abscess which was drained and was discharged on oral antibiotics.  Recalled to the emergency department due to positive blood cultures for MSSA which was sent on 11/12.Marland Kitchen  He declined to stay for admission and signed AMA.  Patient presented himself again for admission with intention for treatment of bacteremia.   Started on cefazolin.  ID consulted.   No vegetations as per TEE.  Due to his current IV drug use, patient needs to be inpatient for IV antibiotics,end of therapy on 11/28.    Assessment and Plan: * Abscess of left lower extremity MSSA bacteremia.  Plan to continue IV antibiotic therapy with cefazolin.  Plan to complete therapy on 08/18/23.   Bipolar I disorder, current or most recent episode depressed, with psychotic features (HCC) Improved anxiety with as needed oral lorazepam.    Polysubstance abuse (HCC) Substance abuse counseling.   Noted multiple IV's removed,  He has alerted not to manipulate IV's, and follow protocol to protect IV.  If suspicious activity will have security search into his belongings any possible illicit drugs.   Tobacco abuse Smoking cessation          Subjective: Patient with no chest pain or dyspnea,   Physical Exam: Vitals:   08/16/23 1751 08/16/23 2126 08/17/23 0509 08/17/23 0922  BP: 131/83 123/89 131/76 119/76  Pulse: 91 91 64 66  Resp: 20 20 20 14   Temp: 98.1 F (36.7 C) 98.2 F (36.8 C)   97.8 F (36.6 C)  TempSrc: Oral Oral  Oral  SpO2: 100% 100%  100%  Weight:      Height:       His physical examination has not changed Data Reviewed:    Family Communication: no family at the bedside   Disposition: Status is: Inpatient Remains inpatient appropriate because: plan for discharge home tomorrow   Planned Discharge Destination: Home     Author: Coralie Keens, MD 08/17/2023 2:19 PM  For on call review [email or web address].

## 2023-08-17 NOTE — Plan of Care (Signed)
Problem: Education: Goal: Knowledge of General Education information will improve Description: Including pain rating scale, medication(s)/side effects and non-pharmacologic comfort measures Outcome: Progressing   Problem: Clinical Measurements: Goal: Respiratory complications will improve Outcome: Progressing   Problem: Activity: Goal: Risk for activity intolerance will decrease Outcome: Progressing   Problem: Nutrition: Goal: Adequate nutrition will be maintained Outcome: Progressing   Problem: Elimination: Goal: Will not experience complications related to bowel motility Outcome: Progressing

## 2023-08-18 DIAGNOSIS — F315 Bipolar disorder, current episode depressed, severe, with psychotic features: Secondary | ICD-10-CM | POA: Diagnosis not present

## 2023-08-18 DIAGNOSIS — Z720 Tobacco use: Secondary | ICD-10-CM | POA: Diagnosis not present

## 2023-08-18 DIAGNOSIS — F1910 Other psychoactive substance abuse, uncomplicated: Secondary | ICD-10-CM | POA: Diagnosis not present

## 2023-08-18 DIAGNOSIS — L02416 Cutaneous abscess of left lower limb: Secondary | ICD-10-CM | POA: Diagnosis not present

## 2023-08-18 MED ORDER — HYDROXYZINE HCL 50 MG PO TABS: 50.0000 mg | ORAL_TABLET | Freq: Four times a day (QID) | ORAL | 0 refills | Status: DC | PRN

## 2023-08-18 MED ORDER — ACETAMINOPHEN 325 MG PO TABS: 650.0000 mg | ORAL_TABLET | Freq: Four times a day (QID) | ORAL | Status: DC | PRN

## 2023-08-18 NOTE — Progress Notes (Signed)
[facility]  Date of Admission:  08/06/2023   Total days of inpatient antibiotics 5  Principal Problem:   Abscess of left lower extremity Active Problems:   Bipolar I disorder, current or most recent episode depressed, with psychotic features (HCC)   Tobacco abuse   Polysubstance abuse [facility])          Assessment: 42 year old male with history of lumbar fusion and no hardware per report IVDA, bipolar disorder male with: #MSSA bacteremia Likely 2/2 left knee infection #IVDA - Initially left AMA on 11/12 and readmitted with positive blood cultures on 11/15.  Repeat blood culture 11/15 no growth so far - He had reported some low back pain, MRI T-spine negative for infection. - Source likely left knee as there is an abscess with associated cellulitis. Only small knee joint effusion on x-ray. - TTE and TEE without vegetation  Recommendations: -Continue cefazolin. No new complaints from pt. - Follow repeat blood cultures on 11/15, plan on 2 weeks of antibiotics from negative cultures EOT 11/28.   -ID app ton 12/18  ID will sign off Microbiology:   Antibiotics: Cefazolin 11/15-present Cultures: Blood 11/12 1/1 MSSA 11/15 no growth 6x3 Urine  Other   SUBJECTIVE: Resting in bed.  Interval: Afebrile overnight.    Review of Systems: Review of Systems  All other systems reviewed and are negative.    Scheduled Meds:  enoxaparin (LOVENOX) injection  40 mg Subcutaneous Q24H   pantoprazole  40 mg Oral Daily   Continuous Infusions:   ceFAZolin (ANCEF) IV Stopped (08/18/23 0155)   PRN Meds:.acetaminophen **OR** acetaminophen, LORazepam, ondansetron **OR** ondansetron (ZOFRAN) IV, senna-docusate Allergies  Allergen Reactions   Augmentin [Amoxicillin-Pot Clavulanate] Hives    Has patient had a PCN reaction causing immediate rash, facial/tongue/throat swelling, SOB or lightheadedness with hypotension: Yes Has patient had a PCN reaction  causing severe rash involving mucus membranes or skin necrosis: No Has patient had a PCN reaction that required hospitalization: No Has patient had a PCN reaction occurring within the last 10 years: No If all of the above answers are "NO", then may proceed with Cephalosporin use.     OBJECTIVE: Vitals:   08/17/23 0922 08/17/23 1642 08/17/23 2059 08/18/23 0503  BP: 119/76 117/66 119/77 119/88  Pulse: 66 100 88 95  Resp: 14 18 18 18   Temp: 97.8 F (36.6 C) 98.5 F (36.9 C) 97.7 F (36.5 C) 99.5 F (37.5 C)  TempSrc: Oral Oral Oral Oral  SpO2: 100% 100% 97% 100%  Weight:      Height:       Body mass index is 23 kg/m.  Physical Exam Constitutional:      General: He is not in acute distress.    Appearance: He is normal weight. He is not toxic-appearing.  HENT:     Head: Normocephalic and atraumatic.     Right Ear: External ear normal.     Left Ear: External ear normal.     Nose: No congestion or rhinorrhea.     Mouth/Throat:     Mouth: Mucous membranes are moist.     Pharynx: Oropharynx is clear.  Eyes:     Extraocular Movements: Extraocular movements intact.     Conjunctiva/sclera: Conjunctivae normal.     Pupils: Pupils are equal, round, and reactive to light.  Cardiovascular:     Rate and Rhythm: Normal rate and regular rhythm.     Heart sounds: No murmur heard.  No friction rub. No gallop.  Pulmonary:     Effort: Pulmonary effort is normal.     Breath sounds: Normal breath sounds.  Abdominal:     General: Abdomen is flat. Bowel sounds are normal.     Palpations: Abdomen is soft.  Musculoskeletal:        General: No swelling. Normal range of motion.     Cervical back: Normal range of motion and neck supple.  Skin:    General: Skin is warm and dry.  Neurological:     General: No focal deficit present.     Mental Status: He is oriented to person, place, and time.  Psychiatric:        Mood and Affect: Mood normal.       Lab Results Lab Results   Component Value Date   WBC 10.3 08/15/2023   HGB 14.5 08/15/2023   HCT 43.4 08/15/2023   MCV 89.9 08/15/2023   PLT 262 08/15/2023    Lab Results  Component Value Date   CREATININE 0.76 08/15/2023   BUN 27 (H) 08/15/2023   NA 135 08/15/2023   K 4.1 08/15/2023   CL 102 08/15/2023   CO2 21 (L) 08/15/2023    Lab Results  Component Value Date   ALT 20 08/06/2023   AST 29 08/06/2023   ALKPHOS 58 08/06/2023   BILITOT 0.4 08/06/2023        Danelle Earthly, MD [facility] 08/18/2023, 6:53 AM  I have personally spent 52 minutes involved in face-to-face and non-face-to-face activities for this patient on the day of the visit. Professional time spent includes the following activities: Preparing to see the patient (review of tests), Obtaining and/or reviewing separately obtained history (admission/discharge record), Performing a medically appropriate examination and/or evaluation , Ordering medications/tests/procedures, referring and communicating with other health care professionals, Documenting clinical information in the EMR, Independently interpreting results (not separately reported), Communicating results to the patient/family/caregiver, Counseling and educating the patient/family/caregiver and Care coordination (not separately reported).

## 2023-08-18 NOTE — Discharge Summary (Signed)
Physician Discharge Summary   Patient: Trevor [name] MRN: [id number] DOB: 22-Jan-1981  Admit date:     08/06/2023  Discharge date: 08/18/23  Discharge Physician: York Ram Moritz Lever   PCP: Pcp, No   Recommendations at discharge:    Patient has completed IV antibiotic therapy with Cefazolin for MSSA bacteremia in the hospital. He has been advised to resume taking quetiapine at night and a new prescription for hydroxyzine was sent to his pharmacy to use as needed.  Ambulatory referral for psychiatry  Advised not to use street drugs.  Follow up with primary care in 7 to 10 days.   Discharge Diagnoses: Principal Problem:   Abscess of left lower extremity Active Problems:   Bipolar I disorder, current or most recent episode depressed, with psychotic features (HCC)   Polysubstance abuse (HCC)   Tobacco abuse  Resolved Problems:   * No resolved hospital problems. [facility] Course: Trevor [name] was admitted to the hospital with the working diagnosis of MSSA bacteremia in the setting of left  upper extremity cellulitis.   42 year old male with history of bipolar disorder, substance abuse, chronic pain syndrome on methadone, bipolar disorder, hyperlipidemia who was initially seen in the emergency department om 08/02/23 for the evaluation of  left  lower extremity abscess which was drained and he was discharged on oral antibiotic therapy.  His blood cultures resulted positive for MSSA.  11/15 Police did a wellness visit, and brought patient back to the ED to treat his bacteremia.  At 22:15 on 11/15 patient was found unresponsive required naloxone. When he woke up he as agitated and eventually declined to be admitted and left the hospital against medial advice. Patient returned to the ED on 11/16 with the intention to be treated for his bacteremia.  On his initial physical examination his blood pressure was 126/80, HR 99, RR 13 and 02 saturation 100%, lungs with no wheezing or rales,  heart with S1 and S2 present and regular, abdomen with no distention and no lower extremity edema.  Left lower extremity with small abscess medial to the knee with granulation tissue in the middle with an surrounding tenderness and erythema. Mild fluctuance but not drainage.   Na 138, K 3,9 Cl 101 bicarbonate 28, glucose 83 bun 10 cr 0,92 Lactic acid 0,3 Wbc 6,7 hgb 12.4 plt 313  Urine analysis with SG 1,014 with no protein, negative leukocyte and negative hgb.   MRI thoracic spine with no evidence of thoracic spine infection.  Mild thoracic disc degeneration without stenosis.  Mild spine stenosis and mid to moderate right neural foraminal stenosis at C6 -C7.   EKG 97 normal axis, normal intervals, sinus rhythm with no significant ST segment or T wave changes. Positive LVH criteria per EKG.   Started on cefazolin.  ID consulted.   No vegetations as per TEE.  Due to his current IV drug use, patient needs to be inpatient for IV antibiotics,end of therapy on 11/28.    11/28 patient has completed his antibiotic therapy and plan to discharge home with follow up with primary care. Referral for behavioral health for substance abuse.    Assessment and Plan: * Abscess of left lower extremity MSSA bacteremia. Further work up with transesophageal echocardiogram showed no vegetations.  LV systolic function preserved 55 to 60%, ejection fraction. RV systolic function preserved. No left atrial or left atrial appendage thrombus detected.  No significant valvular abnormalities.  No evidence of any interatrial shunt.   Patient was treated with  IV cefazolin with good toleration.  Because history of intravenous substance abuse history he was not candidate for outpatient antibiotic therapy.   Follow up blood cultures from 11/15 have no growth.  Patient has been advices to follow up as outpatient and avoid substance abuse.   Bipolar I disorder, current or most recent episode depressed, with psychotic  features (HCC) Improved anxiety with as needed oral lorazepam.  Patient will need formal treatment for anxiety and substance abuse.  He has tolerated well lorazepam to control his symptoms during hospitalization.  As outpatient he has been on hydroxyzine for anxiety, will refill this agent and he will need follow up as outpatient.  He has been advised not to use any street drugs.   Advised to resume taking quetiapine that has been prescribed as outpatient.   Polysubstance abuse (HCC) Substance abuse counseling.   Will need outpatient follow up.   Tobacco abuse Smoking cessation           Consultants: ID  Procedures performed: none   Disposition: Home Diet recommendation:  Discharge Diet Orders (From admission, onward)     Start     Ordered   08/18/23 0000  Diet - low sodium heart healthy        08/18/23 1014           Regular diet DISCHARGE MEDICATION: Allergies as of 08/18/2023       Reactions   Augmentin [amoxicillin-pot Clavulanate] Hives   Has patient had a PCN reaction causing immediate rash, facial/tongue/throat swelling, SOB or lightheadedness with hypotension: Yes Has patient had a PCN reaction causing severe rash involving mucus membranes or skin necrosis: No Has patient had a PCN reaction that required hospitalization: No Has patient had a PCN reaction occurring within the last 10 years: No If all of the above answers are "NO", then may proceed with Cephalosporin use.        Medication List     STOP taking these medications    atorvastatin 20 MG tablet Commonly known as: LIPITOR   doxepin 75 MG capsule Commonly known as: SINEQUAN   doxycycline 100 MG capsule Commonly known as: VIBRAMYCIN   ibuprofen 600 MG tablet Commonly known as: ADVIL   melatonin 5 MG Tabs   METHADONE HCL PO   naproxen 500 MG tablet Commonly known as: NAPROSYN   nicotine 21 mg/24hr patch Commonly known as: NICODERM CQ - dosed in mg/24 hours   OLANZapine 10  MG tablet Commonly known as: ZYPREXA   ondansetron 4 MG tablet Commonly known as: ZOFRAN   sertraline 100 MG tablet Commonly known as: ZOLOFT   tamsulosin 0.4 MG Caps capsule Commonly known as: FLOMAX       TAKE these medications    acetaminophen 325 MG tablet Commonly known as: Tylenol Take 2 tablets (650 mg total) by mouth every 6 (six) hours as needed for mild pain (pain score 1-3), moderate pain (pain score 4-6) or fever.   gabapentin 400 MG capsule Commonly known as: NEURONTIN Take 1 capsule (400 mg total) by mouth 3 (three) times daily.   hydrOXYzine 50 MG tablet Commonly known as: ATARAX Take 1 tablet (50 mg total) by mouth every 6 (six) hours as needed for anxiety.   QUEtiapine 100 MG tablet Commonly known as: SEROQUEL Take 1 tablet (100 mg total) by mouth at bedtime.        Discharge Exam: Filed Weights   08/06/23 1543  Weight: 74.8 kg   BP (!) 125/98 (BP Location: Left  Wrist)   Pulse 75   Temp 97.9 F (36.6 C) (Oral)   Resp 17   Ht 5\' 11"  (1.803 m)   Wt 74.8 kg   SpO2 100%   BMI 23.00 kg/m   Patient is feeling well, no chest pain or dyspnea  Neurology awake and alert EnT with no pallor Cardiovascular with S1 and S2 present and regular Respiratory with no wheezing Abdomen with no distention  No lower extremity edema   Condition at discharge: stable  The results of significant diagnostics from this hospitalization (including imaging, microbiology, ancillary and laboratory) are listed below for reference.   Imaging Studies: ECHO TEE  Result Date: 08/09/2023    TRANSESOPHOGEAL ECHO REPORT   Patient Name:   DALER SALERNO Date of Exam: 08/09/2023 Medical Rec #:  [id number]       Height:       71.0 in Accession #:    [id number]      Weight:       164.9 lb Date of Birth:  1981-05-18        BSA:          1.943 m Patient Age:    42 years        BP:           111/90 mmHg Patient Gender: M               HR:           86 bpm. Exam Location:   Inpatient Procedure: 2D Echo, Color Doppler and Cardiac Doppler Indications:     Bacteremia  History:         Patient has no prior history of Echocardiogram examinations.                  Risk Factors:Dyslipidemia and Polysubstance Abuse.  Sonographer:     Irving Burton Senior RDCS Referring Phys:  [id number] WEAVER Diagnosing Phys: Olga Millers MD  Sonographer Comments: Myer Haff [phone] PROCEDURE: After discussion of the risks and benefits of a TEE, an informed consent was obtained from the patient. The transesophogeal probe was passed without difficulty through the esophogus of the patient. Sedation performed by different physician. The patient was monitored while under deep sedation. Anesthestetic sedation was provided intravenously by Anesthesiology: 100mg  of Propofol, 80mg  of Lidocaine. The patient developed no complications during the procedure.  IMPRESSIONS  1. No vegetations.  2. Left ventricular ejection fraction, by estimation, is 55 to 60%. The left ventricle has normal function. The left ventricle has no regional wall motion abnormalities.  3. Right ventricular systolic function is normal. The right ventricular size is normal.  4. No left atrial/left atrial appendage thrombus was detected.  5. The mitral valve is normal in structure. No evidence of mitral valve regurgitation.  6. The aortic valve is tricuspid. Aortic valve regurgitation is not visualized.  7. There is mild (Grade II) plaque involving the descending aorta.  8. Agitated saline contrast bubble study was negative, with no evidence of any interatrial shunt. FINDINGS  Left Ventricle: Left ventricular ejection fraction, by estimation, is 55 to 60%. The left ventricle has normal function. The left ventricle has no regional wall motion abnormalities. The left ventricular internal cavity size was normal in size. Right Ventricle: The right ventricular size is normal. Right ventricular systolic function is normal. Left Atrium: Left atrial size was normal in  size. No left atrial/left atrial appendage thrombus was detected. Right Atrium: Right atrial size was normal in size. Pericardium:  There is no evidence of pericardial effusion. Mitral Valve: The mitral valve is normal in structure. No evidence of mitral valve regurgitation. Tricuspid Valve: The tricuspid valve is normal in structure. Tricuspid valve regurgitation is trivial. Aortic Valve: The aortic valve is tricuspid. Aortic valve regurgitation is not visualized. Pulmonic Valve: The pulmonic valve was normal in structure. Pulmonic valve regurgitation is not visualized. Aorta: The aortic root is normal in size and structure. There is mild (Grade II) plaque involving the descending aorta. IAS/Shunts: No atrial level shunt detected by color flow Doppler. Agitated saline contrast bubble study was negative, with no evidence of any interatrial shunt. Additional Comments: No vegetations. Spectral Doppler performed. Olga Millers MD Electronically signed by Olga Millers MD Signature Date/Time: 08/09/2023/12:09:48 PM    Final    VAS [place] LOWER EXTREMITY VENOUS (DVT)  Result Date: 08/09/2023  Lower Venous DVT Study Patient Name:  MICA KOWALICK  Date of Exam:   08/08/2023 Medical Rec #: [id number]        Accession #:    [id number] Date of Birth: July 22, 1981         Patient Gender: M Patient Age:   54 years Exam Location:  [facility] Procedure:      VAS [place] LOWER EXTREMITY VENOUS (DVT) Referring Phys: PROSPER AMPONSAH --------------------------------------------------------------------------------  Indications: Edema, and Abscess. Other Indications: Bacteremia. Risk Factors: IV drug use. Limitations: Poor ultrasound/tissue interface. Comparison Study: No prior study Performing Technologist: Sherren Kerns RVS  Examination Guidelines: A complete evaluation includes B-mode imaging, spectral Doppler, color Doppler, and power Doppler as needed of all accessible portions of each vessel. Bilateral testing is considered  an integral part of a complete examination. Limited examinations for reoccurring indications may be performed as noted. The reflux portion of the exam is performed with the patient in reverse Trendelenburg.  +-----+---------------+---------+-----------+----------+--------------+ RIGHTCompressibilityPhasicitySpontaneityPropertiesThrombus Aging +-----+---------------+---------+-----------+----------+--------------+ CFV  Full           Yes      Yes                                 +-----+---------------+---------+-----------+----------+--------------+   +---------+---------------+---------+-----------+----------+-------------------+ LEFT     CompressibilityPhasicitySpontaneityPropertiesThrombus Aging      +---------+---------------+---------+-----------+----------+-------------------+ CFV      Full           Yes      Yes                                      +---------+---------------+---------+-----------+----------+-------------------+ SFJ      Full                                                             +---------+---------------+---------+-----------+----------+-------------------+ FV Prox  Full           Yes      Yes                                      +---------+---------------+---------+-----------+----------+-------------------+ FV Mid   Full                                                             +---------+---------------+---------+-----------+----------+-------------------+  FV DistalFull           Yes      Yes                                      +---------+---------------+---------+-----------+----------+-------------------+ PFV      Full           Yes      Yes                                      +---------+---------------+---------+-----------+----------+-------------------+ POP      Full           Yes      Yes                                      +---------+---------------+---------+-----------+----------+-------------------+  PTV      Full                                                             +---------+---------------+---------+-----------+----------+-------------------+ PERO                                                  Not well visualized +---------+---------------+---------+-----------+----------+-------------------+ Gastroc  Full                                                             +---------+---------------+---------+-----------+----------+-------------------+     Summary: RIGHT: - No evidence of common femoral vein obstruction.   LEFT: - There is no evidence of deep vein thrombosis in the lower extremity. However, portions of this examination were limited- see technologist comments above.  - No cystic structure found in the popliteal fossa.  *See table(s) above for measurements and observations. Electronically signed by Gerarda Fraction on 08/09/2023 at 11:40:00 AM.    Final    EP STUDY  Result Date: 08/09/2023 See surgical note for result.  MR THORACIC SPINE W WO CONTRAST  Result Date: 08/07/2023 CLINICAL DATA:  Bacteremia.  Midline tenderness from T10-T12. EXAM: MRI THORACIC WITHOUT AND WITH CONTRAST TECHNIQUE: Multiplanar and multiecho pulse sequences of the thoracic spine were obtained without and with intravenous contrast. CONTRAST:  7mL GADAVIST GADOBUTROL 1 MMOL/ML IV SOLN COMPARISON:  None Available. FINDINGS: Precontrast axial T1 images were inadvertently not obtained through the upper thoracic spine. Alignment: Normal. Vertebrae: No fracture, suspicious marrow lesion, significant marrow edema, or evidence of discitis/osteomyelitis. Cord: Normal cord signal and morphology. No abnormal intradural enhancement. Paraspinal and other soft tissues: No paraspinal or epidural fluid collection. Disc levels: Disc bulging and asymmetric right uncovertebral spurring at C6-7 resulting in mild spinal stenosis and mild-to-moderate right neural foraminal stenosis. Small central disc protrusions  at T5-6 and T6-7 without stenosis. Mild lower thoracic facet hypertrophy.  IMPRESSION: 1. No evidence of thoracic spine infection. 2. Mild thoracic disc degeneration without stenosis. 3. Mild spinal stenosis and mild-to-moderate right neural foraminal stenosis at C6-7. Electronically Signed   By: Sebastian Ache M.D.   On: 08/07/2023 18:29   DG Chest Port 1 View  Result Date: 08/05/2023 CLINICAL DATA:  Questionable sepsis, evaluate for abnormality peer EXAM: PORTABLE CHEST 1 VIEW COMPARISON:  08/02/2023 FINDINGS: Cardiac and mediastinal contours are within normal limits. No focal pulmonary opacity. No pleural effusion or pneumothorax. No acute osseous abnormality. IMPRESSION: No acute cardiopulmonary process. Electronically Signed   By: Wiliam Ke M.D.   On: 08/05/2023 12:12   DG Chest 2 View  Result Date: 08/03/2023 CLINICAL DATA:  Infected insect bite. EXAM: CHEST - 2 VIEW COMPARISON:  August 14, 2018 FINDINGS: The heart size and mediastinal contours are within normal limits. Both lungs are clear. The visualized skeletal structures are unremarkable. IMPRESSION: No active cardiopulmonary disease. Electronically Signed   By: Aram Candela M.D.   On: 08/03/2023 02:00    Microbiology: Results for orders placed or performed during the hospital encounter of 08/05/23  Resp panel by RT-PCR (RSV, Flu A&B, Covid) Anterior Nasal Swab     Status: None   Collection Time: 08/05/23  9:49 AM   Specimen: Anterior Nasal Swab  Result Value Ref Range Status   SARS Coronavirus 2 by RT PCR NEGATIVE NEGATIVE Final    Comment: (NOTE) SARS-CoV-2 target nucleic acids are NOT DETECTED.  The SARS-CoV-2 RNA is generally detectable in upper respiratory specimens during the acute phase of infection. The lowest concentration of SARS-CoV-2 viral copies this assay can detect is 138 copies/mL. A negative result does not preclude SARS-Cov-2 infection and should not be used as the sole basis for treatment or other  patient management decisions. A negative result may occur with  improper specimen collection/handling, submission of specimen other than nasopharyngeal swab, presence of viral mutation(s) within the areas targeted by this assay, and inadequate number of viral copies(<138 copies/mL). A negative result must be combined with clinical observations, patient history, and epidemiological information. The expected result is Negative.  Fact Sheet for Patients:  BloggerCourse.com  Fact Sheet for Healthcare Providers:  SeriousBroker.it  This test is no t yet approved or cleared by the [place] FDA and  has been authorized for detection and/or diagnosis of SARS-CoV-2 by FDA under an Emergency Use Authorization (EUA). This EUA will remain  in effect (meaning this test can be used) for the duration of the COVID-19 declaration under Section 564(b)(1) of the Act, 21 U.S.C.section 360bbb-3(b)(1), unless the authorization is terminated  or revoked sooner.       Influenza A by PCR NEGATIVE NEGATIVE Final   Influenza B by PCR NEGATIVE NEGATIVE Final    Comment: (NOTE) The Xpert Xpress SARS-CoV-2/FLU/RSV plus assay is intended as an aid in the diagnosis of influenza from Nasopharyngeal swab specimens and should not be used as a sole basis for treatment. Nasal washings and aspirates are unacceptable for Xpert Xpress SARS-CoV-2/FLU/RSV testing.  Fact Sheet for Patients: BloggerCourse.com  Fact Sheet for Healthcare Providers: SeriousBroker.it  This test is not yet approved or cleared by the [place] FDA and has been authorized for detection and/or diagnosis of SARS-CoV-2 by FDA under an Emergency Use Authorization (EUA). This EUA will remain in effect (meaning this test can be used) for the duration of the COVID-19 declaration under Section 564(b)(1) of the Act, 21 U.S.C. section  360bbb-3(b)(1), unless the authorization is terminated or  revoked.     Resp Syncytial Virus by PCR NEGATIVE NEGATIVE Final    Comment: (NOTE) Fact Sheet for Patients: BloggerCourse.com  Fact Sheet for Healthcare Providers: SeriousBroker.it  This test is not yet approved or cleared by the [place] FDA and has been authorized for detection and/or diagnosis of SARS-CoV-2 by FDA under an Emergency Use Authorization (EUA). This EUA will remain in effect (meaning this test can be used) for the duration of the COVID-19 declaration under Section 564(b)(1) of the Act, 21 U.S.C. section 360bbb-3(b)(1), unless the authorization is terminated or revoked.  Performed at [facility], [address], [place], [place] [address]   Blood Culture (routine x 2)     Status: None   Collection Time: 08/05/23 10:30 AM   Specimen: BLOOD  Result Value Ref Range Status   Specimen Description   Final    BLOOD SITE NOT SPECIFIED Performed at [facility], [address] [place], [place] [address]    Special Requests   Final    BOTTLES DRAWN AEROBIC AND ANAEROBIC Blood Culture results may not be optimal due to an excessive volume of blood received in culture bottles Performed at [facility], [address], [place], [place] [address]    Culture   Final    NO GROWTH 5 DAYS Performed at [facility], [address] [place], [place] [address]    Report Status 08/10/2023 FINAL  Final  Blood Culture (routine x 2)     Status: None   Collection Time: 08/05/23 10:30 AM   Specimen: BLOOD  Result Value Ref Range Status   Specimen Description   Final    BLOOD SITE NOT SPECIFIED Performed at [facility], [address] [place], [place] [address]    Special Requests   Final    BOTTLES DRAWN AEROBIC AND ANAEROBIC Blood Culture results may not be optimal due to an excessive  volume of blood received in culture bottles Performed at [facility], [address] [place], [place] [address]    Culture   Final    NO GROWTH 5 DAYS Performed at [facility], [address], [place], [place] [address]    Report Status 08/10/2023 FINAL  Final    Labs: CBC: Recent Labs  Lab 08/15/23 0922  WBC 10.3  HGB 14.5  HCT 43.4  MCV 89.9  PLT 262   Basic Metabolic Panel: Recent Labs  Lab 08/15/23 0701 08/15/23 1836  NA 135  --   K 5.6* 4.1  CL 102  --   CO2 21*  --   GLUCOSE 94  --   BUN 27*  --   CREATININE 0.76  --   CALCIUM 9.3  --    Liver Function Tests: No results for input(s): "AST", "ALT", "ALKPHOS", "BILITOT", "PROT", "ALBUMIN" in the last 168 hours. CBG: No results for input(s): "GLUCAP" in the last 168 hours.  Discharge time spent: greater than 30 minutes.  Signed: Coralie Keens, MD [facility] 08/18/2023

## 2023-08-18 NOTE — Progress Notes (Signed)
Trevor [name] to be D/C'd  per MD order.  Discussed with the patient and all questions fully answered.  VSS, Skin clean, dry and intact without evidence of skin break down, no evidence of skin tears noted.  IV catheter discontinued intact. Site without signs and symptoms of complications. Dressing and pressure applied.  An After Visit Summary was printed and given to the patient. Patient received prescription.  D/c education completed with patient/family including follow up instructions, medication list, d/c activities limitations if indicated, with other d/c instructions as indicated by MD - patient able to verbalize understanding, all questions fully answered.   Patient instructed to return to ED, call 911, or call MD for any changes in condition.   Patient to be escorted via WC, and D/C home via private auto. Trevor [name] to be D/C'd  per MD order.  Discussed with the patient and all questions fully answered.  VSS, Skin clean, dry and intact without evidence of skin break down, no evidence of skin tears noted.  IV catheter discontinued intact. Site without signs and symptoms of complications. Dressing and pressure applied.  An After Visit Summary was printed and given to the patient. Patient received prescription script.  D/c education completed with patient/family including follow up instructions, medication list, d/c activities limitations if indicated, with other d/c instructions as indicated by MD - patient able to verbalize understanding, all questions fully answered.   Patient instructed to return to ED, call 911, or call MD for any changes in condition.   Patient decided to walk instead of leaving in wheelchair. He has all his personal belongings.

## 2023-08-18 NOTE — Plan of Care (Signed)

## 2023-08-18 NOTE — TOC Transition Note (Signed)
Transition of Care [facility]) - CM/SW Discharge Note   Patient Details  Name: Trevor [name] MRN: [id number] Date of Birth: 07-03-1981  Transition of Care [facility]) CM/SW Contact:  Gordy Clement, RN Phone Number: 08/18/2023, 10:48 AM   Clinical Narrative:    Patient will DC to home today. Patient states he needs a bus pass (Provided)  He has requested a PCP be established and that he be called with details about the appointment . RNCM requested PCP be set. No additioanl TOC needs        Patient Goals and CMS Choice      Discharge Placement                         Discharge Plan and Services Additional resources added to the After Visit Summary for                                       Social Determinants of Health (SDOH) Interventions SDOH Screenings   Food Insecurity: Food Insecurity Present (08/05/2023)  Housing: Patient Declined (08/05/2023)  Transportation Needs: Patient Declined (08/06/2023)  Recent Concern: Transportation Needs - Unmet Transportation Needs (08/05/2023)  Utilities: At Risk (08/05/2023)  Alcohol Screen: Low Risk  (07/24/2018)  Tobacco Use: High Risk (08/09/2023)     Readmission Risk Interventions     No data to display

## 2023-08-30 ENCOUNTER — Emergency Department (HOSPITAL_COMMUNITY)
Admission: EM | Admit: 2023-08-30 | Discharge: 2023-08-31 | Disposition: A | Discharge status: Other Acute Inpatient Hospital | Payer: BLUE CROSS/BLUE SHIELD | Attending: Emergency Medicine | Admitting: Emergency Medicine

## 2023-08-30 ENCOUNTER — Encounter (HOSPITAL_COMMUNITY): Payer: Self-pay

## 2023-08-30 ENCOUNTER — Emergency Department (HOSPITAL_COMMUNITY)
Admission: EM | Admit: 2023-08-30 | Discharge: 2023-08-30 | Discharge status: Against Medical Advice | Payer: BLUE CROSS/BLUE SHIELD | Source: Home / Self Care

## 2023-08-30 ENCOUNTER — Other Ambulatory Visit: Payer: Self-pay

## 2023-08-30 DIAGNOSIS — R440 Auditory hallucinations: Secondary | ICD-10-CM

## 2023-08-30 DIAGNOSIS — F1290 Cannabis use, unspecified, uncomplicated: Secondary | ICD-10-CM | POA: Insufficient documentation

## 2023-08-30 DIAGNOSIS — F1520 Other stimulant dependence, uncomplicated: Secondary | ICD-10-CM | POA: Diagnosis present

## 2023-08-30 DIAGNOSIS — F1210 Cannabis abuse, uncomplicated: Secondary | ICD-10-CM | POA: Diagnosis present

## 2023-08-30 DIAGNOSIS — F3130 Bipolar disorder, current episode depressed, mild or moderate severity, unspecified: Secondary | ICD-10-CM | POA: Diagnosis present

## 2023-08-30 DIAGNOSIS — F315 Bipolar disorder, current episode depressed, severe, with psychotic features: Secondary | ICD-10-CM | POA: Diagnosis present

## 2023-08-30 LAB — CBC
HCT: 41.6 % (ref 39.0–52.0)
Hemoglobin: 13.3 g/dL (ref 13.0–17.0)
MCH: 29.7 pg (ref 26.0–34.0)
MCHC: 32 g/dL (ref 30.0–36.0)
MCV: 92.9 fL (ref 80.0–100.0)
Platelets: 273 10*3/uL (ref 150–400)
RBC: 4.48 MIL/uL (ref 4.22–5.81)
RDW: 13.7 % (ref 11.5–15.5)
WBC: 7.2 10*3/uL (ref 4.0–10.5)
nRBC: 0 % (ref 0.0–0.2)

## 2023-08-30 LAB — COMPREHENSIVE METABOLIC PANEL
ALT: 30 U/L (ref 0–44)
AST: 43 U/L — ABNORMAL HIGH (ref 15–41)
Albumin: 4.1 g/dL (ref 3.5–5.0)
Alkaline Phosphatase: 53 U/L (ref 38–126)
Anion gap: 8 (ref 5–15)
BUN: 12 mg/dL (ref 6–20)
CO2: 26 mmol/L (ref 22–32)
Calcium: 8.7 mg/dL — ABNORMAL LOW (ref 8.9–10.3)
Chloride: 103 mmol/L (ref 98–111)
Creatinine, Ser: 0.95 mg/dL (ref 0.61–1.24)
GFR, Estimated: >60 mL/min (ref >60)
Glucose, Bld: 90 mg/dL (ref 70–99)
Potassium: 3.5 mmol/L (ref 3.5–5.1)
Sodium: 137 mmol/L (ref 135–145)
Total Bilirubin: 1.1 mg/dL (ref <1.2)
Total Protein: 7.3 g/dL (ref 6.5–8.1)

## 2023-08-30 LAB — RAPID URINE DRUG SCREEN, HOSP PERFORMED
Amphetamines: POSITIVE — AB
Barbiturates: NOT DETECTED
Benzodiazepines: POSITIVE — AB
Cocaine: NOT DETECTED
Opiates: NOT DETECTED
Tetrahydrocannabinol: POSITIVE — AB

## 2023-08-30 LAB — ETHANOL: Alcohol, Ethyl (B): <10 mg/dL (ref <10)

## 2023-08-30 MED ORDER — HYDROXYZINE HCL 25 MG PO TABS
25.0000 mg | ORAL_TABLET | Freq: Three times a day (TID) | ORAL | Status: DC | PRN
  Administered 2023-08-30: 25 mg via ORAL
  Filled 2023-08-30: qty 1

## 2023-08-30 MED ORDER — QUETIAPINE FUMARATE 100 MG PO TABS
100.0000 mg | ORAL_TABLET | Freq: Every day | ORAL | Status: DC
  Administered 2023-08-30: 100 mg via ORAL
  Filled 2023-08-30: qty 1

## 2023-08-30 MED ORDER — ESCITALOPRAM OXALATE 10 MG PO TABS
5.0000 mg | ORAL_TABLET | Freq: Every day | ORAL | Status: DC
  Administered 2023-08-30: 5 mg via ORAL
  Filled 2023-08-30: qty 1

## 2023-08-30 NOTE — ED Notes (Signed)
Pt belongings are in locker 42.

## 2023-08-30 NOTE — ED Notes (Addendum)
Pt given second blanket, sandwhich and sprite to drink. Pt given the tv remote. Pt educated on tv remote rules to return to nurses station once done with it. Pt complained and said here you can just have it that's stupid.

## 2023-08-30 NOTE — ED Notes (Signed)
Pt wanded by security. 

## 2023-08-30 NOTE — ED Triage Notes (Signed)
Patient reports auditory hallucinations x 2 days. Has hx of bipolar disorder, does not take any medication for this. Also uses meth. Patient reports SI without a plan.

## 2023-08-30 NOTE — ED Notes (Signed)
Pt given sausage biscuit and decaf coffee

## 2023-08-30 NOTE — ED Notes (Signed)
Asked pt if I could please get his vitals, pt asked if we could get them later in a sleepy voice. This Clinical research associate agreed to pt request.

## 2023-08-30 NOTE — ED Notes (Signed)
Pt dinner tray delivered

## 2023-08-30 NOTE — ED Notes (Signed)
Pt tray ordered

## 2023-08-30 NOTE — ED Notes (Signed)
Pt received food tray.
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
## 2023-08-30 NOTE — ED Notes (Signed)
Pt changed out into psych scrubs. Belongings have been placed behind triage nurses station.

## 2023-08-30 NOTE — ED Provider Notes (Signed)
[place] EMERGENCY DEPARTMENT AT [facility] Provider Note   CSN: [id number] Arrival date & time: 08/30/23  0746     History  Chief Complaint  Patient presents with   Hallucinations    Trevor [name] is a 42 y.o. male with a history of bipolar 1 disorder, polysubstance abuse, and anemia who presents the ED today for hallucinations.  Patient reports auditory hallucinations for the past 2 days.  Patient denies any visual hallucinations, SI, or HI.  Patient states that he wants to die but does not have a plan.  He currently does not take any psychiatric medications.  Additionally, patient reports meth use.  He seeking further psychiatric treatment.  No additional complaints or concerns at this time.    Home Medications Prior to Admission medications   Medication Sig Start Date End Date Taking? Authorizing Provider  acetaminophen (TYLENOL) 325 MG tablet Take 2 tablets (650 mg total) by mouth every 6 (six) hours as needed for mild pain (pain score 1-3), moderate pain (pain score 4-6) or fever. 08/18/23   Arrien, York Ram, MD  gabapentin (NEURONTIN) 400 MG capsule Take 1 capsule (400 mg total) by mouth 3 (three) times daily. 07/31/18   Micheal Likens, MD  hydrOXYzine (ATARAX) 50 MG tablet Take 1 tablet (50 mg total) by mouth every 6 (six) hours as needed for anxiety. 08/18/23   Arrien, York Ram, MD  QUEtiapine (SEROQUEL) 100 MG tablet Take 1 tablet (100 mg total) by mouth at bedtime. Patient not taking: Reported on 08/05/2023 12/17/20   Marcellina Millin, MD      Allergies    Augmentin [amoxicillin-pot clavulanate]    Review of Systems   Review of Systems  Psychiatric/Behavioral:  Positive for hallucinations.   All other systems reviewed and are negative.   Physical Exam Updated Vital Signs BP 111/82 (BP Location: Left Arm)   Pulse 78   Temp 98.3 F (36.8 C) (Oral)   Resp 17   Ht 5\' 11"  (1.803 m)   Wt 70.3 kg   SpO2 100%   BMI 21.62  kg/m  Physical Exam Vitals and nursing note reviewed.  Constitutional:      General: He is not in acute distress.    Appearance: Normal appearance.  HENT:     Head: Normocephalic and atraumatic.     Comments: Contusion to the forehead    Mouth/Throat:     Mouth: Mucous membranes are moist.  Eyes:     Conjunctiva/sclera: Conjunctivae normal.     Pupils: Pupils are equal, round, and reactive to light.  Cardiovascular:     Rate and Rhythm: Normal rate and regular rhythm.     Pulses: Normal pulses.     Heart sounds: Normal heart sounds.  Pulmonary:     Effort: Pulmonary effort is normal.     Breath sounds: Normal breath sounds.  Abdominal:     Palpations: Abdomen is soft.     Tenderness: There is no abdominal tenderness.  Skin:    General: Skin is warm and dry.     Findings: No rash.  Neurological:     General: No focal deficit present.     Mental Status: He is alert.  Psychiatric:        Mood and Affect: Mood normal.        Behavior: Behavior normal.    ED Results / Procedures / Treatments   Labs (all labs ordered are listed, but only abnormal results are displayed) Labs Reviewed  COMPREHENSIVE METABOLIC PANEL - Abnormal; Notable for the following components:      Result Value   Calcium 8.7 (*)    AST 43 (*)    All other components within normal limits  RAPID URINE DRUG SCREEN, HOSP PERFORMED - Abnormal; Notable for the following components:   Benzodiazepines POSITIVE (*)    Amphetamines POSITIVE (*)    Tetrahydrocannabinol POSITIVE (*)    All other components within normal limits  ETHANOL  CBC    EKG None  Radiology No results found.  Procedures Procedures: not indicated.   Medications Ordered in ED Medications  QUEtiapine (SEROQUEL) tablet 100 mg (has no administration in time range)  hydrOXYzine (ATARAX) tablet 25 mg (has no administration in time range)  escitalopram (LEXAPRO) tablet 5 mg (has no administration in time range)    ED Course/  Medical Decision Making/ A&P                                 Medical Decision Making Amount and/or Complexity of Data Reviewed Labs: ordered.   This patient presents to the ED for concern of auditory hallucinations, this involves an extensive number of treatment options, and is a complaint that carries with it a high risk of complications and morbidity.   Differential diagnosis includes: substance abuse, withdrawal, acute psychosis, schizophrenia, bipolar disorder, etc.   Comorbidities  See HPI above   Additional History  Additional history obtained from prior records.   Lab Tests  I ordered and personally interpreted labs.  The pertinent results include:   CMP and CBC within normal limits no acute electrolyte derangement, AKI, infection, or anemia Negative ethanol UDS positive for benzodiazepines, amphetamines, and tetrahydrocannabinol   Consultations  I requested consultation with TTS,  and discussed lab and imaging findings as well as pertinent plan - they recommend: Inpatient admission for further evaluation and management of patient symptoms.   Problem List / ED Course / Critical Interventions / Medication Management  Auditory hallucinations x2 days I have reviewed the patients home medicines and have made adjustments as needed   Social Determinants of Health  Substance abuse   Test / Admission - Considered  Patient agreeable for admission to behavioral health services.       Final Clinical Impression(s) / ED Diagnoses Final diagnoses:  Auditory hallucination    Rx / DC Orders ED Discharge Orders     None         Maxwell Marion, PA-C 08/30/23 1355    Royanne Foots, DO 09/04/23 2022

## 2023-08-31 ENCOUNTER — Inpatient Hospital Stay
Admission: AD | Admit: 2023-08-31 | Discharge: 2023-09-07 | DRG: 897 | Disposition: A | Discharge status: Home / Self Care | Payer: BLUE CROSS/BLUE SHIELD | Source: Intra-hospital | Attending: Psychiatry | Admitting: Psychiatry

## 2023-08-31 ENCOUNTER — Encounter: Payer: Self-pay | Admitting: Psychiatry

## 2023-08-31 DIAGNOSIS — Z8249 Family history of ischemic heart disease and other diseases of the circulatory system: Secondary | ICD-10-CM

## 2023-08-31 DIAGNOSIS — R45851 Suicidal ideations: Secondary | ICD-10-CM | POA: Diagnosis present

## 2023-08-31 DIAGNOSIS — Z83438 Family history of other disorder of lipoprotein metabolism and other lipidemia: Secondary | ICD-10-CM

## 2023-08-31 DIAGNOSIS — W57XXXA Bitten or stung by nonvenomous insect and other nonvenomous arthropods, initial encounter: Secondary | ICD-10-CM | POA: Diagnosis present

## 2023-08-31 DIAGNOSIS — F1210 Cannabis abuse, uncomplicated: Secondary | ICD-10-CM | POA: Diagnosis not present

## 2023-08-31 DIAGNOSIS — F1520 Other stimulant dependence, uncomplicated: Secondary | ICD-10-CM | POA: Diagnosis present

## 2023-08-31 DIAGNOSIS — F3130 Bipolar disorder, current episode depressed, mild or moderate severity, unspecified: Secondary | ICD-10-CM | POA: Diagnosis present

## 2023-08-31 DIAGNOSIS — F3160 Bipolar disorder, current episode mixed, unspecified: Secondary | ICD-10-CM | POA: Diagnosis present

## 2023-08-31 DIAGNOSIS — Z9151 Personal history of suicidal behavior: Secondary | ICD-10-CM

## 2023-08-31 DIAGNOSIS — Z79899 Other long term (current) drug therapy: Secondary | ICD-10-CM

## 2023-08-31 DIAGNOSIS — F15259 Other stimulant dependence with stimulant-induced psychotic disorder, unspecified: Secondary | ICD-10-CM | POA: Diagnosis present

## 2023-08-31 DIAGNOSIS — Z91148 Patient's other noncompliance with medication regimen for other reason: Secondary | ICD-10-CM

## 2023-08-31 DIAGNOSIS — F17210 Nicotine dependence, cigarettes, uncomplicated: Secondary | ICD-10-CM | POA: Diagnosis present

## 2023-08-31 DIAGNOSIS — F411 Generalized anxiety disorder: Secondary | ICD-10-CM | POA: Diagnosis present

## 2023-08-31 DIAGNOSIS — Z5982 Transportation insecurity: Secondary | ICD-10-CM

## 2023-08-31 DIAGNOSIS — F15251 Other stimulant dependence with stimulant-induced psychotic disorder with hallucinations: Principal | ICD-10-CM | POA: Diagnosis present

## 2023-08-31 DIAGNOSIS — F1910 Other psychoactive substance abuse, uncomplicated: Secondary | ICD-10-CM | POA: Diagnosis present

## 2023-08-31 DIAGNOSIS — E785 Hyperlipidemia, unspecified: Secondary | ICD-10-CM | POA: Diagnosis present

## 2023-08-31 DIAGNOSIS — G8929 Other chronic pain: Secondary | ICD-10-CM | POA: Diagnosis present

## 2023-08-31 DIAGNOSIS — Z981 Arthrodesis status: Secondary | ICD-10-CM

## 2023-08-31 DIAGNOSIS — G479 Sleep disorder, unspecified: Secondary | ICD-10-CM | POA: Diagnosis present

## 2023-08-31 DIAGNOSIS — Z5900 Homelessness unspecified: Secondary | ICD-10-CM

## 2023-08-31 DIAGNOSIS — T148XXA Other injury of unspecified body region, initial encounter: Secondary | ICD-10-CM | POA: Diagnosis present

## 2023-08-31 DIAGNOSIS — F19259 Other psychoactive substance dependence with psychoactive substance-induced psychotic disorder, unspecified: Secondary | ICD-10-CM | POA: Diagnosis not present

## 2023-08-31 DIAGNOSIS — F319 Bipolar disorder, unspecified: Secondary | ICD-10-CM | POA: Diagnosis not present

## 2023-08-31 MED ORDER — MAGNESIUM HYDROXIDE 400 MG/5ML PO SUSP: 30.0000 mL | Freq: Every day | ORAL | Status: DC | PRN

## 2023-08-31 MED ORDER — OLANZAPINE 10 MG IM SOLR: 5.0000 mg | Freq: Three times a day (TID) | INTRAMUSCULAR | Status: DC | PRN

## 2023-08-31 MED ORDER — OLANZAPINE 10 MG IM SOLR: 10.0000 mg | Freq: Three times a day (TID) | INTRAMUSCULAR | Status: DC | PRN

## 2023-08-31 MED ORDER — HYDROXYZINE HCL 50 MG PO TABS
50.0000 mg | ORAL_TABLET | Freq: Four times a day (QID) | ORAL | Status: DC | PRN
  Administered 2023-08-31 – 2023-09-07 (×14): 50 mg via ORAL
  Filled 2023-08-31 (×14): qty 1

## 2023-08-31 MED ORDER — ALUM & MAG HYDROXIDE-SIMETH 200-200-20 MG/5ML PO SUSP: 30.0000 mL | ORAL | Status: DC | PRN

## 2023-08-31 MED ORDER — GABAPENTIN 100 MG PO CAPS
100.0000 mg | ORAL_CAPSULE | Freq: Two times a day (BID) | ORAL | Status: DC
  Administered 2023-08-31 – 2023-09-07 (×14): 100 mg via ORAL
  Filled 2023-08-31 (×14): qty 1

## 2023-08-31 MED ORDER — ACETAMINOPHEN 325 MG PO TABS: 650.0000 mg | ORAL_TABLET | Freq: Four times a day (QID) | ORAL | Status: DC | PRN

## 2023-08-31 MED ORDER — OLANZAPINE 5 MG PO TBDP
5.0000 mg | ORAL_TABLET | Freq: Three times a day (TID) | ORAL | Status: DC | PRN
  Administered 2023-08-31 – 2023-09-07 (×6): 5 mg via ORAL
  Filled 2023-08-31 (×6): qty 1

## 2023-08-31 MED ORDER — DIVALPROEX SODIUM ER 250 MG PO TB24
250.0000 mg | ORAL_TABLET | Freq: Every day | ORAL | Status: DC
  Administered 2023-08-31 – 2023-09-05 (×6): 250 mg via ORAL
  Filled 2023-08-31 (×7): qty 1

## 2023-08-31 MED ORDER — ESCITALOPRAM OXALATE 10 MG PO TABS
10.0000 mg | ORAL_TABLET | Freq: Every day | ORAL | Status: DC
Start: 2023-08-31 — End: 2023-09-05
  Administered 2023-08-31 – 2023-09-05 (×6): 10 mg via ORAL
  Filled 2023-08-31 (×6): qty 1

## 2023-08-31 MED ORDER — ENSURE ENLIVE PO LIQD
1.0000 | Freq: Two times a day (BID) | ORAL | Status: DC
Start: 2023-08-31 — End: 2023-09-07
  Administered 2023-08-31 – 2023-09-07 (×12): 237 mL via ORAL

## 2023-08-31 MED ORDER — NICOTINE POLACRILEX 2 MG MT GUM
2.0000 mg | CHEWING_GUM | OROMUCOSAL | Status: DC | PRN
Start: 2023-08-31 — End: 2023-09-07
  Administered 2023-08-31 – 2023-09-01 (×2): 2 mg via ORAL
  Filled 2023-08-31 (×2): qty 1

## 2023-08-31 MED ORDER — QUETIAPINE FUMARATE 100 MG PO TABS
100.0000 mg | ORAL_TABLET | Freq: Every day | ORAL | Status: DC
Start: 2023-08-31 — End: 2023-09-01
  Administered 2023-08-31: 100 mg via ORAL
  Filled 2023-08-31: qty 1

## 2023-08-31 NOTE — [facility] Counselor (Signed)
Adult Comprehensive Assessment  Patient ID: Trevor [name], male   DOB: 03-16-1981, 42 y.o.   MRN: [id number]  Information Source: Information source: Patient (Previous PSA from 07/24/28 encounter.)  Current Stressors:  Patient states their primary concerns and needs for treatment are:: "I need to get my shit together and get my life in order." Patient states their goals for this hospitilization and ongoing recovery are:: "I need to get my shit together and get my life in order." Educational / Learning stressors: None reported Employment / Job issues: Pt is unemployed Family Relationships: None reported Surveyor, quantity / Lack of resources (include bankruptcy): Pt is unemployed Housing / Lack of housing: Issues with previous living environment. 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 Bereavement / Loss: Pt reports that his mother died last year. However, per previous assessment, pt mother died in 05-Sep-2017.  Living/Environment/Situation:  Living Arrangements: Non-relatives/Friends Living conditions (as described by patient or guardian): "Not the best environment." Living with a friend. Who else lives in the home?: Pt, his girlfriend, his friend and "all other types of people living there." How long has patient lived in current situation?: "Almost four years." What is atmosphere in current home: Chaotic  Family History:  Marital status: Single Are you sexually active?: No What is your sexual orientation?: heterosexual Has your sexual activity been affected by drugs, alcohol, medication, or emotional stress?: n/a  Does patient have children?: No  Childhood History:  By whom was/is the patient raised?: Mother Additional childhood history information: Mother raised pt. Father was not in the picture. Pt describes his childhood life as  "amazing." Description of patient's relationship with caregiver when they were a child: "Great." Patient's description of current relationship with people who raised him/her: Mother is deceased. How were you disciplined when you got in trouble as a child/adolescent?: "Got whooped." Does patient have siblings?: Yes Number of Siblings: 1 (younger brother) Description of patient's current relationship with siblings: "Not good." Did patient suffer any verbal/emotional/physical/sexual abuse as a child?: No Did patient suffer from severe childhood neglect?: No Has patient ever been sexually abused/assaulted/raped as an adolescent or adult?: No Was the patient ever a victim of a crime or a disaster?: No Witnessed domestic violence?: No Has patient been affected by domestic violence as an adult?: No  Education:  Highest grade of school patient has completed: High school graduate Currently a student?: No Learning disability?: No  Employment/Work Situation:   Employment Situation: Unemployed Patient's Job has Been Impacted by Current Illness: Yes Describe how Patient's Job has Been Impacted: "I can't work because my mood is completely out of control." taken from previous assessment What is the Longest Time Patient has Held a Job?: "Three years" 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 When asked how much daily, he states, "However much I can smoke," before stating that he doesn't use meth as much anymore. 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 [facility]. [facility]. Pt shares that he has had at least 13 treatment episodes. Has alcohol/substance abuse ever caused legal problems?: Yes  Social Support System:   Patient's Community Support System: None Describe Community Support System: "I don't have one." Type of faith/religion: Pt denies How does patient's faith help to cope with current illness?: N/A  Leisure/Recreation:   Do You Have Hobbies?: Yes Leisure and Hobbies: "I like to color."  Strengths/Needs:   What is the patient's perception of their strengths?: "I don't know right now." Patient states they can use these personal strengths during their treatment to contribute to their recovery: N/A Patient states these barriers may affect/interfere with their treatment: Pt denies any barriers. Patient states these barriers may affect their return to the community: Pt denies any barriers. Other important information patient would like considered in planning for their treatment: Pt denies any barriers.  Discharge Plan:   Currently receiving community mental health services: No Patient states concerns and preferences for aftercare planning are: Pt is interested in continued medication management and therapy. Patient states they will know when they are safe and ready for discharge when: "I don't know." Does patient have access to transportation?: No Does patient have financial barriers related to discharge medications?: No Patient description of barriers related to discharge medications: N/A Plan for no access to transportation at discharge: CSW will assist with transportation arrangements at discharge. Will patient be returning to same living situation after discharge?:  (Pt shares that he is uncertain of what he will do at discharge.)  Summary/Recommendations:   Summary and Recommendations (to be completed by the evaluator): Pt is a 42 year old, single, male from  [place], [place] [facility]). He shared that he came into the hospital because he needs to get his life in order. During treatment team meeting, pt stated goals as to "stay focused on what I need to do and get back to a normal life." He denied having any support system on the outside, is currently unemployed, and uncertain about where he will go upon discharge. CSW informed pt that he would need to let the team know soon about his decision about where he would like to go after treatment. He agreed. Pt reported use of both meth and marijuana daily. When asked about the amount of use he just stated however much he could smoke and then stated that he was uncertain about specific amounts. He does go further to state that he does not use meth as much anymore. Pt is not currently connected with any outpatient services but would like to have continued services for medication management and therapy post discharge. Recommendations include: crisis stabilization, therapeutic milieu, encourage group attendance and participation, medication management for detox/mood stabilization and development of comprehensive mental wellness/sobriety plan.  Glenis Smoker. 08/31/2023

## 2023-08-31 NOTE — Progress Notes (Signed)
Nursing Shift Note:  1900-0700  Attended Evening Group: No Medication Compliant:  Yes Behavior: irritable; cooperative; isolative Sleep Quality: good Significant Changes:Pt is generally isolative but does comply with medication regimen. Pt denies SI Hi AVH.  Continued monitoring for safety.      08/31/23 2200  Psych Admission Type (Psych Patients Only)  Admission Status Voluntary  Psychosocial Assessment  Patient Complaints Irritability  Eye Contact Fair  Facial Expression Anxious  Affect Irritable  Speech Logical/coherent  Interaction Assertive  Motor Activity Fidgety  Appearance/Hygiene Disheveled  Behavior Characteristics Cooperative  Mood Irritable  Thought Process  Coherency WDL  Content WDL  Delusions None reported or observed  Perception WDL  Judgment Impaired  Confusion None  Danger to Self  Current suicidal ideation? Denies  Danger to Others  Danger to Others None reported or observed

## 2023-08-31 NOTE — BH IP Treatment Plan (Signed)
Interdisciplinary Treatment and Diagnostic Plan Update  08/31/2023 Time of Session: 09:19 Trevor [name] MRN: [id number]  Principal Diagnosis: Bipolar disorder, mixed (HCC)  Secondary Diagnoses: Principal Problem:   Bipolar disorder, mixed (HCC)   Current Medications:  Current Facility-Administered Medications  Medication Dose Route Frequency Provider Last Rate Last Admin   acetaminophen (TYLENOL) tablet 650 mg  650 mg Oral Q6H PRN Sindy Guadeloupe, NP       alum & mag hydroxide-simeth (MAALOX/MYLANTA) 200-200-20 MG/5ML suspension 30 mL  30 mL Oral Q4H PRN Sindy Guadeloupe, NP       magnesium hydroxide (MILK OF MAGNESIA) suspension 30 mL  30 mL Oral Daily PRN Sindy Guadeloupe, NP       OLANZapine (ZYPREXA) injection 10 mg  10 mg Intramuscular TID PRN Sindy Guadeloupe, NP       OLANZapine (ZYPREXA) injection 5 mg  5 mg Intramuscular TID PRN Sindy Guadeloupe, NP       OLANZapine zydis (ZYPREXA) disintegrating tablet 5 mg  5 mg Oral TID PRN Sindy Guadeloupe, NP   5 mg at 08/31/23 4098   PTA Medications: Medications Prior to Admission  Medication Sig Dispense Refill Last Dose   acetaminophen (TYLENOL) 325 MG tablet Take 2 tablets (650 mg total) by mouth every 6 (six) hours as needed for mild pain (pain score 1-3), moderate pain (pain score 4-6) or fever.      gabapentin (NEURONTIN) 400 MG capsule Take 1 capsule (400 mg total) by mouth 3 (three) times daily. 45 capsule 1    hydrOXYzine (ATARAX) 50 MG tablet Take 1 tablet (50 mg total) by mouth every 6 (six) hours as needed for anxiety. 15 tablet 0    QUEtiapine (SEROQUEL) 100 MG tablet Take 1 tablet (100 mg total) by mouth at bedtime. (Patient not taking: Reported on 08/05/2023) 30 tablet 1     Patient Stressors: Medication change or noncompliance   Substance abuse    Patient Strengths: Capable of independent living  Forensic psychologist fund of knowledge  Motivation for treatment/growth   Treatment Modalities: Medication Management, Group  therapy, Case management,  1 to 1 session with clinician, Psychoeducation, Recreational therapy.   Physician Treatment Plan for Primary Diagnosis: Bipolar disorder, mixed (HCC) Long Term Goal(s):     Short Term Goals:    Medication Management: Evaluate patient's response, side effects, and tolerance of medication regimen.  Therapeutic Interventions: 1 to 1 sessions, Unit Group sessions and Medication administration.  Evaluation of Outcomes: Not Met  Physician Treatment Plan for Secondary Diagnosis: Principal Problem:   Bipolar disorder, mixed (HCC)  Long Term Goal(s):     Short Term Goals:       Medication Management: Evaluate patient's response, side effects, and tolerance of medication regimen.  Therapeutic Interventions: 1 to 1 sessions, Unit Group sessions and Medication administration.  Evaluation of Outcomes: Not Met   RN Treatment Plan for Primary Diagnosis: Bipolar disorder, mixed (HCC) Long Term Goal(s): Knowledge of disease and therapeutic regimen to maintain health will improve  Short Term Goals: Ability to remain free from injury will improve, Ability to verbalize frustration and anger appropriately will improve, Ability to demonstrate self-control, Ability to participate in decision making will improve, Ability to verbalize feelings will improve, Ability to disclose and discuss suicidal ideas, Ability to identify and develop effective coping behaviors will improve, and Compliance with prescribed medications will improve  Medication Management: RN will administer medications as ordered by provider, will assess and evaluate patient's response and provide education to patient  for prescribed medication. RN will report any adverse and/or side effects to prescribing provider.  Therapeutic Interventions: 1 on 1 counseling sessions, Psychoeducation, Medication administration, Evaluate responses to treatment, Monitor vital signs and CBGs as ordered, Perform/monitor CIWA, COWS,  AIMS and Fall Risk screenings as ordered, Perform wound care treatments as ordered.  Evaluation of Outcomes: Not Met   LCSW Treatment Plan for Primary Diagnosis: Bipolar disorder, mixed (HCC) Long Term Goal(s): Safe transition to appropriate next level of care at discharge, Engage patient in therapeutic group addressing interpersonal concerns.  Short Term Goals: Engage patient in aftercare planning with referrals and resources, Increase social support, Increase ability to appropriately verbalize feelings, Increase emotional regulation, Facilitate acceptance of mental health diagnosis and concerns, Facilitate patient progression through stages of change regarding substance use diagnoses and concerns, Identify triggers associated with mental health/substance abuse issues, and Increase skills for wellness and recovery  Therapeutic Interventions: Assess for all discharge needs, 1 to 1 time with Social worker, Explore available resources and support systems, Assess for adequacy in community support network, Educate family and significant other(s) on suicide prevention, Complete Psychosocial Assessment, Interpersonal group therapy.  Evaluation of Outcomes: Not Met   Progress in Treatment: Attending groups: No. Participating in groups: No. Taking medication as prescribed: Yes. Toleration medication: Yes. Family/Significant other contact made: No, will contact:  when given permission. Patient understands diagnosis: Yes. Discussing patient identified problems/goals with staff: Yes. Medical problems stabilized or resolved: Yes. Denies suicidal/homicidal ideation: Yes. Issues/concerns per patient self-inventory: No. Other: none  New problem(s) identified: No, Describe:  none identified.  New Short Term/Long Term Goal(s): detox, elimination of symptoms of psychosis, medication management for mood stabilization; elimination of SI thoughts; development of comprehensive mental wellness/sobriety  plan.  Patient Goals: "To stay focused on what I need to do and get back to a normal life."   Discharge Plan or Barriers: CSW will assist pt with development of an appropriate aftercare/discharge plan.   Reason for Continuation of Hospitalization: Aggression Medication stabilization Suicidal ideation Withdrawal symptoms  Estimated Length of Stay: 1-7 days  Last 3 [place] Suicide Severity Risk Score: Flowsheet Row Admission (Current) from 08/31/2023 in [facility]PATIENT [facility] ED from 08/30/2023 in [facility] ED to Hosp-Admission (Discharged) from 08/06/2023 in [facility]  SURGICAL  C-SSRS RISK CATEGORY Low Risk Low Risk No Risk       Last PHQ 2/9 Scores:     No data to display          Scribe for Treatment Team: Glenis Smoker, LCSW 08/31/2023 9:41 AM

## 2023-08-31 NOTE — Group Note (Signed)
Date:  08/31/2023 Time:  10:36 AM  Group Topic/Focus:  Building Self Esteem:   The Focus of this group is helping patients become aware of the effects of self-esteem on their lives, the things they and others do that enhance or undermine their self-esteem, seeing the relationship between their level of self-esteem and the choices they make and learning ways to enhance self-esteem.    Participation Level:  Did Not Attend   Trevor [name] 08/31/2023, 10:36 AM
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
## 2023-08-31 NOTE — Group Note (Signed)
Date:  08/31/2023 Time:  3:30 PM  Group Topic/Focus:  Healthy Communication:   The focus of this group is to discuss communication, barriers to communication, as well as healthy ways to communicate with others.    Participation Level:  Did Not Attend   Trevor [name] Jocilyn Trego 08/31/2023, 3:30 PM
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
## 2023-08-31 NOTE — Plan of Care (Signed)
  Problem: Safety: Goal: Periods of time without injury will increase Outcome: Progressing   

## 2023-08-31 NOTE — Group Note (Signed)
LCSW Group Therapy Note   Group Date: 08/31/2023 Start Time: 1300 End Time: 1400   Type of Therapy and Topic:  Group Therapy: Challenging Core Beliefs  Participation Level:  Did Not Attend  Description of Group:  Patients were educated about core beliefs and asked to identify one harmful core belief that they have. Patients were asked to explore from where those beliefs originate. Patients were asked to discuss how those beliefs make them feel and the resulting behaviors of those beliefs. They were then be asked if those beliefs are true and, if so, what evidence they have to support them. Lastly, group members were challenged to replace those negative core beliefs with helpful beliefs.   Therapeutic Goals:   1. Patient will identify harmful core beliefs and explore the origins of such beliefs. 2. Patient will identify feelings and behaviors that result from those core beliefs. 3. Patient will discuss whether such beliefs are true. 4.  Patient will replace harmful core beliefs with helpful ones.  Summary of Patient Progress:  Patient did not attend.   Therapeutic Modalities: Cognitive Behavioral Therapy; Solution-Focused Therapy   Trevor [name], Theresia Majors 08/31/2023  3:13 PM

## 2023-08-31 NOTE — [facility] Suicide Risk Assessment (Signed)
[facility] INPATIENT:  Family/Significant Other Suicide Prevention Education  Suicide Prevention Education:  Patient Refusal for Family/Significant Other Suicide Prevention Education: The patient Trevor [name] has refused to provide written consent for family/significant other to be provided Family/Significant Other Suicide Prevention Education during admission and/or prior to discharge.  Physician notified.  SPE completed with pt, as pt refused to consent to family contact. SPI pamphlet provided to pt and pt was encouraged to share information with support network, ask questions, and talk about any concerns relating to SPE. Pt denies access to guns/firearms and verbalized understanding of information provided. Mobile Crisis information also provided to pt.  Glenis Smoker 08/31/2023, 1:50 PM

## 2023-08-31 NOTE — [facility] Suicide Risk Assessment (Signed)
[facility] Admission Suicide Risk Assessment   Nursing information obtained from:  Patient Demographic factors:  Male, Caucasian, Low socioeconomic status Current Mental Status:  NA Loss Factors:  NA Historical Factors:  Prior suicide attempts Risk Reduction Factors:  Positive coping skills or problem solving skills  Total Time spent with patient: 2.5 hours Principal Problem: Bipolar disorder, mixed (HCC) Diagnosis:  Principal Problem:   Bipolar disorder, mixed (HCC) Active Problems:   Polysubstance abuse (HCC)   Methamphetamine dependence (HCC)   Cannabis abuse, continuous use  Subjective Data: 42 year old Caucasian male with a history of Bipolar I Disorder, Generalized Anxiety Disorder (GAD), chronic polysubstance abuse, chronic pain (managed with methadone), and hyperlipidemia (HLD). He presented with active auditory hallucinations for the past two days, expressing difficulty understanding the voices. He reports a long history of methamphetamine and methadone use, though he claims to have been using only cannabis for the past month. However, urine drug screening (UDS) was positive for methamphetamine, benzodiazepines, and cannabis. The patient expressed feelings of anger at "the whole world," depression, and daily suicidal ideation. He has a history of two suicide attempts: one by overdose and another involving an attempt to jump off a bridge. He reports poor sleep, reduced appetite, and disappointment over multiple failed rehab attempts (four in total). The patient denies HIV or Hepatitis infections but admits to injecting methamphetamine and sharing needles in the past.The patient stated, "I want to get myself back, I haven't taken my mental health medications or seen a mental health provider in 10 years." He denies living in the woods despite presenting with multiple insect bites. The patient is unable to contract for safety.    Continued Clinical Symptoms:  Alcohol Use Disorder  Identification Test Final Score (AUDIT): 0 The "Alcohol Use Disorders Identification Test", Guidelines for Use in Primary Care, Second Edition.  World Science writer [facility]). Score between 0-7:  no or low risk or alcohol related problems. Score between 8-15:  moderate risk of alcohol related problems. Score between 16-19:  high risk of alcohol related problems. Score 20 or above:  warrants further diagnostic evaluation for alcohol dependence and treatment.   CLINICAL FACTORS:   Bipolar Disorder:   Bipolar II Depression:   Anhedonia Comorbid alcohol abuse/dependence Hopelessness Impulsivity Insomnia Alcohol/Substance Abuse/Dependencies   Musculoskeletal: Strength & Muscle Tone: within normal limits Gait & Station: normal Patient leans: N/A  Psychiatric Specialty Exam:  Presentation  General Appearance:  Disheveled  Eye Contact: Poor  Speech: Clear and Coherent  Speech Volume: Increased  Handedness: Right   Mood and Affect  Mood: Anxious; Irritable  Affect: Flat   Thought Process  Thought Processes: Goal Directed  Descriptions of Associations:Intact  Orientation:Full (Time, Place and Person) (and situation)  Thought Content:WDL  History of Schizophrenia/Schizoaffective disorder:none reported Duration of Psychotic Symptoms:No data recorded Hallucinations:Hallucinations: Auditory Description of Auditory Hallucinations: "sometmes they tell me I am wrong"  Ideas of Reference:None  Suicidal Thoughts:Suicidal Thoughts: No SI Active Intent and/or Plan: -- (denies)  Homicidal Thoughts:Homicidal Thoughts: No   Sensorium  Memory: Immediate Good; Remote Fair  Judgment: Fair  Insight: Poor   Executive Functions  Concentration: Good  Attention Span: Fair  Recall: Good  Fund of Knowledge: Good  Language: Good   Psychomotor Activity  Psychomotor Activity: Psychomotor Activity: Normal   Assets  Assets: Communication Skills;  Desire for Improvement   Sleep  Sleep: Sleep: Fair Number of Hours of Sleep: 4    Physical Exam: Physical Exam Vitals and nursing note reviewed.  Constitutional:  Appearance: Normal appearance.  HENT:     Head: Normocephalic and atraumatic.     Nose: Nose normal.  Pulmonary:     Effort: Pulmonary effort is normal.  Musculoskeletal:        General: Normal range of motion.     Cervical back: Normal range of motion.  Neurological:     General: No focal deficit present.     Mental Status: He is alert and oriented to person, place, and time. Mental status is at baseline.  Psychiatric:        Attention and Perception: Attention and perception normal.        Mood and Affect: Mood is anxious. Affect is flat.        Speech: Speech is rapid and pressured.        Behavior: Behavior is withdrawn. Behavior is cooperative.        Thought Content: Thought content normal.        Cognition and Memory: Cognition and memory normal.        Judgment: Judgment is impulsive.    Review of Systems  Psychiatric/Behavioral:  Positive for hallucinations and substance abuse. The patient is nervous/anxious.   All other systems reviewed and are negative.  Blood pressure 107/70, pulse 79, temperature 98.4 F (36.9 C), temperature source Oral, resp. rate 17, height 5\' 11"  (1.803 m), weight 71.7 kg, SpO2 100%. Body mass index is 22.04 kg/m.   COGNITIVE FEATURES THAT CONTRIBUTE TO RISK:  None    SUICIDE RISK:   Minimal: No identifiable suicidal ideation.  Patients presenting with no risk factors but with morbid ruminations; may be classified as minimal risk based on the severity of the depressive symptoms  PLAN OF CARE:  Divalproex (Depakote): Start at 250 mg daily, titrate as tolerated for mood stabilization. Quetiapine (Seroquel): Start at 100 mg nightly for psychosis, mood stabilization, and sleep support. Escitalopram (Lexapro): Start at 20 mg daily for depression and anxiety. Thiamine  and Multivitamins: To address potential deficiencies related to substance use. Lipid Profile manage potential cardiovascular risks. Engage patient in motivational interviewing to address ambivalence about recovery Coordinate with LCSW for discharge planning, including a referral to long-term residential treatment or rehab facilities if need I certify that inpatient services furnished can reasonably be expected to improve the patient's condition.   Myriam Forehand, NP 08/31/2023, 4:14 PM

## 2023-08-31 NOTE — Progress Notes (Signed)
Pt was accepted to [facility]  08/31/2023 ; Bed Assignment 311 PENDING Signed Voluntary consent uploaded to pt's chart or faxed to [facility] FAX Number [phone] Address: [address] [place], [place], [place] [address]  [id number] I disorder, current or most recent episode depressed, with psychotic features (HCC); Methamphetamine dependence (HCC) Cannabis abuse, continuous use  Pt meets inpatient criteria per [name], NP-PMHNP-BC   Attending Physician will be Dr. Shellee Milo  Report can be called to: [phone]  Pt can arrive after: [facility] RN to coordinate with the care team.  Care Team notified: Night [facility] Herold Harms, VIR, HEEB Robinson,Paramedic    Maryjean Ka, MSW, [facility] 08/31/2023 12:21 AM

## 2023-08-31 NOTE — Group Note (Signed)
Date:  08/31/2023 Time:  9:44 PM  Group Topic/Focus:  Stages of Change:   The focus of this group is to explain the stages of change and help patients identify changes they want to make upon discharge.    Participation Level:  Did Not Attend  Participation Quality:   none  Affect:   none  Cognitive:   none  Insight: None  Engagement in Group:   none  Modes of Intervention:   none  Additional Comments:  none   Kimela Malstrom 08/31/2023, 9:44 PM

## 2023-08-31 NOTE — Group Note (Signed)
Recreation Therapy Group Note   Group Topic:Relaxation  Group Date: 08/31/2023 Start Time: 1000 End Time: 1050 Facilitators: Rosina Lowenstein, LRT, CTRS Location:  Craft Room  Group Description: PMR (Progressive Muscle Relaxation). LRT asks patients their current level of stress/anxiety from 1-10, with 10 being the highest. LRT educates patients on what PMR is and the benefits that come from it. Patients are asked to sit with their feet flat on the floor while sitting up and all the way back in their chair, if possible. LRT and pts follow a prompt through a speaker that requires you to tense and release different muscles in their body and focus on their breathing. During session, lights are off and soft music is being played. Pts are given a stress ball to use if needed. At the end of the prompt, LRT asks patients to rank their current levels of stress/anxiety from 1-10, 10 being the highest. LRT provides patients with an education handout on PMR.   Goal Area(s) Addressed:  Patients will be able to describe progressive muscle relaxation.  Patient will practice using relaxation technique. Patient will identify a new coping skill.  Patient will follow multistep directions to reduce anxiety and stress.   Affect/Mood: N/A   Participation Level: Did not attend    Clinical Observations/Individualized Feedback: Trevor [name] did not attend group.  Plan: Continue to engage patient in RT group sessions 2-3x/week.   Rosina Lowenstein, LRT, CTRS 08/31/2023 1:15 PM

## 2023-08-31 NOTE — Tx Team (Signed)
Initial Treatment Plan 08/31/2023 2:15 AM Trevor [name] ZOX:[id number]    PATIENT STRESSORS: Medication change or noncompliance   Substance abuse     PATIENT STRENGTHS: Capable of independent living  Communication skills  General fund of knowledge  Motivation for treatment/growth    PATIENT IDENTIFIED PROBLEMS: Substance Abuse  Hallucinations  Housing  Medication non compliance               DISCHARGE CRITERIA:  Improved stabilization in mood, thinking, and/or behavior  PRELIMINARY DISCHARGE PLAN: Outpatient therapy  PATIENT/FAMILY INVOLVEMENT: This treatment plan has been presented to and reviewed with the patient, Trevor [name], and/or family member,  .  The patient and family have been given the opportunity to ask questions and make suggestions.  Shelia Media, RN 08/31/2023, 2:15 AM

## 2023-08-31 NOTE — Plan of Care (Signed)
Patient stayed in his room most of the shift except for meals. Patient states his goal " I need to put my life in order." Patient irritable at the treatment team. Later patient stated that he feels better than this morning. Patient denies SI,HI and AVH at this time. Appetite and energy level good. Support and encouragement given.

## 2023-08-31 NOTE — ED Notes (Signed)
Care report given to [facility] at [facility]

## 2023-08-31 NOTE — ED Notes (Signed)
Tourist information centre manager for transport.

## 2023-09-01 DIAGNOSIS — F3160 Bipolar disorder, current episode mixed, unspecified: Secondary | ICD-10-CM | POA: Diagnosis not present

## 2023-09-01 MED ORDER — NICOTINE 7 MG/24HR TD PT24
7.0000 mg | MEDICATED_PATCH | Freq: Every day | TRANSDERMAL | Status: DC
  Administered 2023-09-01 – 2023-09-07 (×6): 7 mg via TRANSDERMAL
  Filled 2023-09-01 (×7): qty 1

## 2023-09-01 MED ORDER — MELATONIN 5 MG PO TABS: 2.5000 mg | ORAL_TABLET | Freq: Every day | ORAL | Status: DC

## 2023-09-01 MED ORDER — QUETIAPINE FUMARATE 200 MG PO TABS
200.0000 mg | ORAL_TABLET | Freq: Every day | ORAL | Status: DC
  Administered 2023-09-01 – 2023-09-05 (×5): 200 mg via ORAL
  Filled 2023-09-01 (×5): qty 1

## 2023-09-01 MED ORDER — MELATONIN 5 MG PO TABS
5.0000 mg | ORAL_TABLET | Freq: Every day | ORAL | Status: DC
Start: 2023-09-01 — End: 2023-09-07
  Administered 2023-09-01 – 2023-09-06 (×6): 5 mg via ORAL
  Filled 2023-09-01 (×6): qty 1

## 2023-09-01 NOTE — Progress Notes (Signed)
D- Patient alert and oriented.  Patient presented in an anxious, but pleasant mood on assessment stating that he didn't really sleep well last night, reporting that he hasn't taken anything to help. Patient endorsed AH of "mumbling, if I could tell you what they're saying that would do me a world of good". Patient denies SI, HI, VH, and pain at this time. Patient had no stated goals for today.  A- Scheduled medications administered to patient, per MD orders. Support and encouragement provided.  Routine safety checks conducted every 15 minutes.  Patient informed to notify staff with problems or concerns.  R- No adverse drug reactions noted. Patient contracts for safety at this time. Patient compliant with medications, but has not attended any groups up to this point. Patient receptive, calm, and cooperative. Patient interacts well with others on the unit. Patient remains safe at this time.   09/01/23 1000  Psych Admission Type (Psych Patients Only)  Admission Status Voluntary  Psychosocial Assessment  Patient Complaints Anxiety;Depression;Sleep disturbance  Eye Contact Fair  Facial Expression Anxious  Affect Appropriate to circumstance  Speech Logical/coherent  Interaction Assertive  Motor Activity Slow  Appearance/Hygiene Body odor;Disheveled;Poor hygiene;In scrubs  Behavior Characteristics Cooperative;Appropriate to situation  Mood Anxious;Pleasant  Aggressive Behavior  Effect No apparent injury  Thought Process  Coherency WDL  Content WDL  Delusions None reported or observed  Perception WDL  Hallucination None reported or observed  Judgment WDL  Confusion None  Danger to Self  Current suicidal ideation? Denies  Agreement Not to Harm Self Yes  Description of Agreement Verbal  Danger to Others  Danger to Others None reported or observed

## 2023-09-01 NOTE — Group Note (Signed)
Date:  09/01/2023 Time:  11:28 AM  Group Topic/Focus:  Goals Group:   The focus of this group is to help patients establish daily goals to achieve during treatment and discuss how the patient can incorporate goal setting into their daily lives to aide in recovery.   Participation Level:  Did Not Attend   Trevor [name] [name] 09/01/2023, 11:28 AM

## 2023-09-01 NOTE — Plan of Care (Signed)
  Problem: Education: Goal: Knowledge of [facility] Education information/materials will improve Outcome: Not Progressing Goal: Emotional status will improve Outcome: Not Progressing Goal: Mental status will improve Outcome: Not Progressing Goal: Verbalization of understanding the information provided will improve Outcome: Not Progressing   Problem: Activity: Goal: Interest or engagement in activities will improve Outcome: Not Progressing Goal: Sleeping patterns will improve Outcome: Not Progressing   Problem: Coping: Goal: Ability to verbalize frustrations and anger appropriately will improve Outcome: Not Progressing Goal: Ability to demonstrate self-control will improve Outcome: Not Progressing   Problem: Health Behavior/Discharge Planning: Goal: Identification of resources available to assist in meeting health care needs will improve Outcome: Not Progressing Goal: Compliance with treatment plan for underlying cause of condition will improve Outcome: Not Progressing   Problem: Physical Regulation: Goal: Ability to maintain clinical measurements within normal limits will improve Outcome: Not Progressing   Problem: Safety: Goal: Periods of time without injury will increase Outcome: Not Progressing   Problem: Education: Goal: Knowledge of [facility] Education information/materials will improve Outcome: Not Progressing Goal: Emotional status will improve Outcome: Not Progressing Goal: Mental status will improve Outcome: Not Progressing Goal: Verbalization of understanding the information provided will improve Outcome: Not Progressing   Problem: Activity: Goal: Interest or engagement in activities will improve Outcome: Not Progressing Goal: Sleeping patterns will improve Outcome: Not Progressing   Problem: Coping: Goal: Ability to verbalize frustrations and anger appropriately will improve Outcome: Not Progressing Goal: Ability to demonstrate  self-control will improve Outcome: Not Progressing   Problem: Health Behavior/Discharge Planning: Goal: Identification of resources available to assist in meeting health care needs will improve Outcome: Not Progressing Goal: Compliance with treatment plan for underlying cause of condition will improve Outcome: Not Progressing   Problem: Physical Regulation: Goal: Ability to maintain clinical measurements within normal limits will improve Outcome: Not Progressing   Problem: Safety: Goal: Periods of time without injury will increase Outcome: Not Progressing   Problem: Education: Goal: Knowledge of disease or condition will improve Outcome: Not Progressing Goal: Understanding of discharge needs will improve Outcome: Not Progressing   Problem: Health Behavior/Discharge Planning: Goal: Ability to identify changes in lifestyle to reduce recurrence of condition will improve Outcome: Not Progressing Goal: Identification of resources available to assist in meeting health care needs will improve Outcome: Not Progressing   Problem: Physical Regulation: Goal: Complications related to the disease process, condition or treatment will be avoided or minimized Outcome: Not Progressing   Problem: Safety: Goal: Ability to remain free from injury will improve Outcome: Not Progressing

## 2023-09-01 NOTE — Group Note (Signed)
Date:  09/01/2023 Time:  9:00 PM  Group Topic/Focus:  Primary and Secondary Emotions:   The focus of this group is to discuss the difference between primary and secondary emotions.    Participation Level:  Did Not Attend  Participation Quality:   none  Affect:   none  Cognitive:   none  Insight: None  Engagement in Group:   none  Modes of Intervention:   none  Additional Comments:     Kaly Mcquary 09/01/2023, 9:00 PM

## 2023-09-01 NOTE — Group Note (Signed)
[facility] LCSW Group Therapy Note   Group Date: 09/01/2023 Start Time: 1300 End Time: 1400   Type of Therapy/Topic:  Group Therapy:  Balance in Life  Participation Level:  Did Not Attend   Description of Group:    This group will address the concept of balance and how it feels and looks when one is unbalanced. Patients will be encouraged to process areas in their lives that are out of balance, and identify reasons for remaining unbalanced. Facilitators will guide patients utilizing problem- solving interventions to address and correct the stressor making their life unbalanced. Understanding and applying boundaries will be explored and addressed for obtaining  and maintaining a balanced life. Patients will be encouraged to explore ways to assertively make their unbalanced needs known to significant others in their lives, using other group members and facilitator for support and feedback.  Therapeutic Goals: Patient will identify two or more emotions or situations they have that consume much of in their lives. Patient will identify signs/triggers that life has become out of balance:  Patient will identify two ways to set boundaries in order to achieve balance in their lives:  Patient will demonstrate ability to communicate their needs through discussion and/or role plays  Summary of Patient Progress: X   Therapeutic Modalities:   Cognitive Behavioral Therapy Solution-Focused Therapy Assertiveness Training   Glenis Smoker, LCSW
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
## 2023-09-02 DIAGNOSIS — F3160 Bipolar disorder, current episode mixed, unspecified: Secondary | ICD-10-CM | POA: Diagnosis not present

## 2023-09-02 LAB — LIPID PANEL
Cholesterol: 177 mg/dL (ref 0–200)
HDL: 63 mg/dL (ref >40)
LDL Cholesterol: 100 mg/dL — ABNORMAL HIGH (ref 0–99)
Total CHOL/HDL Ratio: 2.8 {ratio}
Triglycerides: 70 mg/dL (ref <150)
VLDL: 14 mg/dL (ref 0–40)

## 2023-09-02 MED ORDER — BUSPIRONE HCL 5 MG PO TABS
7.5000 mg | ORAL_TABLET | Freq: Two times a day (BID) | ORAL | Status: DC
  Administered 2023-09-02 – 2023-09-07 (×10): 7.5 mg via ORAL
  Filled 2023-09-02 (×10): qty 2

## 2023-09-02 NOTE — Progress Notes (Signed)
   09/02/23 1400  Psych Admission Type (Psych Patients Only)  Admission Status Voluntary  Psychosocial Assessment  Patient Complaints Anxiety;Depression;Self-harm thoughts;Sleep disturbance  Eye Contact Fair  Facial Expression Anxious  Affect Preoccupied  Speech Logical/coherent  Interaction Assertive  Motor Activity Slow  Appearance/Hygiene In scrubs;Disheveled;Poor Media planner Cooperative;Appropriate to situation  Mood Anxious;Pleasant  Aggressive Behavior  Effect No apparent injury  Thought Process  Coherency WDL  Content WDL  Delusions None reported or observed  Perception Hallucinations  Hallucination Auditory  Judgment WDL  Confusion None  Danger to Self  Current suicidal ideation? Passive  Self-Injurious Behavior Some self-injurious ideation observed or expressed.  No lethal plan expressed   Agreement Not to Harm Self Yes  Description of Agreement Verbal  Danger to Others  Danger to Others None reported or observed

## 2023-09-02 NOTE — [facility] Counselor (Signed)
Pt received resource list for residential substance use treatment. He thanked CSW for information. Pt did not appear to be in any acute distress. Contact ended without incident.   Vilma Meckel. Algis Greenhouse, MSW, LCSW, LCAS 09/02/2023 11:18 AM

## 2023-09-02 NOTE — Plan of Care (Signed)
  Problem: Education: Goal: Knowledge of [facility] Education information/materials will improve Outcome: Not Progressing Goal: Emotional status will improve Outcome: Not Progressing Goal: Mental status will improve Outcome: Not Progressing Goal: Verbalization of understanding the information provided will improve Outcome: Not Progressing   Problem: Activity: Goal: Interest or engagement in activities will improve Outcome: Not Progressing Goal: Sleeping patterns will improve Outcome: Not Progressing   Problem: Coping: Goal: Ability to verbalize frustrations and anger appropriately will improve Outcome: Not Progressing Goal: Ability to demonstrate self-control will improve Outcome: Not Progressing   Problem: Health Behavior/Discharge Planning: Goal: Identification of resources available to assist in meeting health care needs will improve Outcome: Not Progressing Goal: Compliance with treatment plan for underlying cause of condition will improve Outcome: Not Progressing   Problem: Physical Regulation: Goal: Ability to maintain clinical measurements within normal limits will improve Outcome: Not Progressing   Problem: Safety: Goal: Periods of time without injury will increase Outcome: Not Progressing   Problem: Education: Goal: Knowledge of [facility] Education information/materials will improve Outcome: Not Progressing Goal: Emotional status will improve Outcome: Not Progressing Goal: Mental status will improve Outcome: Not Progressing Goal: Verbalization of understanding the information provided will improve Outcome: Not Progressing   Problem: Activity: Goal: Interest or engagement in activities will improve Outcome: Not Progressing Goal: Sleeping patterns will improve Outcome: Not Progressing   Problem: Coping: Goal: Ability to verbalize frustrations and anger appropriately will improve Outcome: Not Progressing Goal: Ability to demonstrate  self-control will improve Outcome: Not Progressing   Problem: Health Behavior/Discharge Planning: Goal: Identification of resources available to assist in meeting health care needs will improve Outcome: Not Progressing Goal: Compliance with treatment plan for underlying cause of condition will improve Outcome: Not Progressing   Problem: Physical Regulation: Goal: Ability to maintain clinical measurements within normal limits will improve Outcome: Not Progressing   Problem: Safety: Goal: Periods of time without injury will increase Outcome: Not Progressing   Problem: Education: Goal: Knowledge of disease or condition will improve Outcome: Not Progressing Goal: Understanding of discharge needs will improve Outcome: Not Progressing   Problem: Health Behavior/Discharge Planning: Goal: Ability to identify changes in lifestyle to reduce recurrence of condition will improve Outcome: Not Progressing Goal: Identification of resources available to assist in meeting health care needs will improve Outcome: Not Progressing   Problem: Physical Regulation: Goal: Complications related to the disease process, condition or treatment will be avoided or minimized Outcome: Not Progressing   Problem: Safety: Goal: Ability to remain free from injury will improve Outcome: Not Progressing

## 2023-09-02 NOTE — Plan of Care (Signed)
  Problem: Education: Goal: Knowledge of [facility] Education information/materials will improve Outcome: Progressing Goal: Emotional status will improve Outcome: Progressing Goal: Mental status will improve Outcome: Progressing Goal: Verbalization of understanding the information provided will improve Outcome: Progressing   Problem: Activity: Goal: Interest or engagement in activities will improve Outcome: Progressing Goal: Sleeping patterns will improve Outcome: Progressing   Problem: Coping: Goal: Ability to verbalize frustrations and anger appropriately will improve Outcome: Progressing Goal: Ability to demonstrate self-control will improve Outcome: Progressing   Problem: Health Behavior/Discharge Planning: Goal: Identification of resources available to assist in meeting health care needs will improve Outcome: Progressing Goal: Compliance with treatment plan for underlying cause of condition will improve Outcome: Progressing   Problem: Physical Regulation: Goal: Ability to maintain clinical measurements within normal limits will improve Outcome: Progressing   Problem: Safety: Goal: Periods of time without injury will increase Outcome: Progressing   Problem: Education: Goal: Knowledge of  General Education information/materials will improve Outcome: Progressing Goal: Emotional status will improve Outcome: Progressing Goal: Mental status will improve Outcome: Progressing Goal: Verbalization of understanding the information provided will improve Outcome: Progressing   Problem: Activity: Goal: Interest or engagement in activities will improve Outcome: Progressing Goal: Sleeping patterns will improve Outcome: Progressing   Problem: Coping: Goal: Ability to verbalize frustrations and anger appropriately will improve Outcome: Progressing Goal: Ability to demonstrate self-control will improve Outcome: Progressing   Problem: Health  Behavior/Discharge Planning: Goal: Identification of resources available to assist in meeting health care needs will improve Outcome: Progressing Goal: Compliance with treatment plan for underlying cause of condition will improve Outcome: Progressing   Problem: Physical Regulation: Goal: Ability to maintain clinical measurements within normal limits will improve Outcome: Progressing   Problem: Safety: Goal: Periods of time without injury will increase Outcome: Progressing   Problem: Education: Goal: Knowledge of disease or condition will improve Outcome: Progressing Goal: Understanding of discharge needs will improve Outcome: Progressing   Problem: Health Behavior/Discharge Planning: Goal: Ability to identify changes in lifestyle to reduce recurrence of condition will improve Outcome: Progressing Goal: Identification of resources available to assist in meeting health care needs will improve Outcome: Progressing   Problem: Physical Regulation: Goal: Complications related to the disease process, condition or treatment will be avoided or minimized Outcome: Progressing   Problem: Safety: Goal: Ability to remain free from injury will improve Outcome: Progressing

## 2023-09-02 NOTE — Group Note (Signed)
Date:  09/02/2023 Time:  5:45 PM  Group Topic/Focus:  Wellness Toolbox:   The focus of this group is to discuss various aspects of wellness, balancing those aspects and exploring ways to increase the ability to experience wellness.  Patients will create a wellness toolbox for use upon discharge.    Participation Level:  Did Not Attend   Lynelle Smoke [facility] 09/02/2023, 5:45 PM

## 2023-09-02 NOTE — Group Note (Signed)
Date:  09/02/2023 Time:  8:56 PM  Group Topic/Focus:  Wrap-Up Group:   The focus of this group is to help patients review their daily goal of treatment and discuss progress on daily workbooks.    Participation Level:  Did Not Attend   Katina Dung 09/02/2023, 8:56 PM

## 2023-09-02 NOTE — Group Note (Signed)
Date:  09/02/2023 Time:  11:05 AM  Group Topic/Focus:  Goals Group:   The focus of this group is to help patients establish daily goals to achieve during treatment and discuss how the patient can incorporate goal setting into their daily lives to aide in recovery. Wellness Toolbox:   The focus of this group is to discuss various aspects of wellness, balancing those aspects and exploring ways to increase the ability to experience wellness.  Patients will create a wellness toolbox for use upon discharge.    Participation Level:  Did Not Attend   Lynelle Smoke [facility] 09/02/2023, 11:05 AM
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
## 2023-09-03 NOTE — Group Note (Signed)
Date:  09/03/2023 Time:  10:11 PM  Group Topic/Focus:  Wrap-Up Group:   The focus of this group is to help patients review their daily goal of treatment and discuss progress on daily workbooks.    Participation Level:  Did Not Attend   Katina Dung 09/03/2023, 10:11 PM

## 2023-09-03 NOTE — Group Note (Signed)
Date:  09/03/2023 Time:  1:11 PM  Group Topic/Focus:  Conflict Resolution:   The focus of this group is to discuss the conflict resolution process and how it may be used upon discharge. Emotional Education:   The focus of this group is to discuss what feelings/emotions are, and how they are experienced. Managing Feelings:   The focus of this group is to identify what feelings patients have difficulty handling and develop a plan to handle them in a healthier way upon discharge.    Participation Level:  Did Not Attend  Participation Quality:    Affect:    Cognitive:    Insight:   Engagement in Group:    Modes of Intervention:    Additional Comments:    Diontay Rosencrans 09/03/2023, 1:11 PM

## 2023-09-03 NOTE — Progress Notes (Signed)
   09/03/23 1730  Psych Admission Type (Psych Patients Only)  Admission Status Voluntary  Psychosocial Assessment  Patient Complaints Anxiety;Depression  Eye Contact Fair  Facial Expression Anxious  Affect Preoccupied  Speech Logical/coherent  Interaction Isolative  Motor Activity Slow  Appearance/Hygiene In scrubs  Behavior Characteristics Appropriate to situation  Aggressive Behavior  Effect No apparent injury  Thought Process  Coherency WDL  Content WDL  Delusions None reported or observed  Perception WDL  Hallucination None reported or observed  Judgment WDL  Confusion None  Danger to Self  Current suicidal ideation? Denies  Self-Injurious Behavior No self-injurious ideation or behavior indicators observed or expressed   Agreement Not to Harm Self Yes  Description of Agreement Verbal  Danger to Others  Danger to Others None reported or observed   2100: Remain stable.

## 2023-09-04 NOTE — Group Note (Signed)
Date:  09/04/2023 Time:  11:47 AM  Group Topic/Focus:  Spirituality:   The focus of this group is to discuss how one's spirituality can aide in recovery.    Participation Level:  Did Not Attend   Mary Sella Janee Ureste 09/04/2023, 11:47 AM

## 2023-09-04 NOTE — Plan of Care (Signed)
  Problem: Education: Goal: Knowledge of [facility] Education information/materials will improve Outcome: Progressing Goal: Emotional status will improve Outcome: Progressing Goal: Mental status will improve Outcome: Progressing Goal: Verbalization of understanding the information provided will improve Outcome: Progressing   Problem: Activity: Goal: Interest or engagement in activities will improve Outcome: Progressing Goal: Sleeping patterns will improve Outcome: Progressing   Problem: Coping: Goal: Ability to verbalize frustrations and anger appropriately will improve Outcome: Progressing Goal: Ability to demonstrate self-control will improve Outcome: Progressing   Problem: Health Behavior/Discharge Planning: Goal: Identification of resources available to assist in meeting health care needs will improve Outcome: Progressing Goal: Compliance with treatment plan for underlying cause of condition will improve Outcome: Progressing   Problem: Physical Regulation: Goal: Ability to maintain clinical measurements within normal limits will improve Outcome: Progressing   Problem: Safety: Goal: Periods of time without injury will increase Outcome: Progressing   Problem: Education: Goal: Knowledge of  General Education information/materials will improve Outcome: Progressing Goal: Emotional status will improve Outcome: Progressing Goal: Mental status will improve Outcome: Progressing Goal: Verbalization of understanding the information provided will improve Outcome: Progressing   Problem: Activity: Goal: Interest or engagement in activities will improve Outcome: Progressing Goal: Sleeping patterns will improve Outcome: Progressing   Problem: Coping: Goal: Ability to verbalize frustrations and anger appropriately will improve Outcome: Progressing Goal: Ability to demonstrate self-control will improve Outcome: Progressing   Problem: Health  Behavior/Discharge Planning: Goal: Identification of resources available to assist in meeting health care needs will improve Outcome: Progressing Goal: Compliance with treatment plan for underlying cause of condition will improve Outcome: Progressing   Problem: Physical Regulation: Goal: Ability to maintain clinical measurements within normal limits will improve Outcome: Progressing   Problem: Safety: Goal: Periods of time without injury will increase Outcome: Progressing   Problem: Education: Goal: Knowledge of disease or condition will improve Outcome: Progressing Goal: Understanding of discharge needs will improve Outcome: Progressing   Problem: Health Behavior/Discharge Planning: Goal: Ability to identify changes in lifestyle to reduce recurrence of condition will improve Outcome: Progressing Goal: Identification of resources available to assist in meeting health care needs will improve Outcome: Progressing   Problem: Physical Regulation: Goal: Complications related to the disease process, condition or treatment will be avoided or minimized Outcome: Progressing   Problem: Safety: Goal: Ability to remain free from injury will improve Outcome: Progressing

## 2023-09-04 NOTE — Group Note (Signed)
Date:  09/04/2023 Time:  9:10 PM  Group Topic/Focus:  Wrap-Up Group:   The focus of this group is to help patients review their daily goal of treatment and discuss progress on daily workbooks.    Participation Level:  Did Not Attend  Participation Quality:  Appropriate and Drowsy  Affect:  Blunted and Flat  Cognitive:  Appropriate  Insight: Limited  Engagement in Group:  None  Modes of Intervention:  Discussion  Additional Comments:   Staff went to pt room and pt is sleeping in bed. Pt did respond to staff and stated he has not needs at this time and is fine   Maglione,Mandie Crabbe E 09/04/2023, 9:10 PM

## 2023-09-04 NOTE — Progress Notes (Signed)
   09/04/23 1600  Psych Admission Type (Psych Patients Only)  Admission Status Voluntary  Psychosocial Assessment  Patient Complaints Anxiety;Depression;Sleep disturbance;Other (Comment) (hallucinations; patient states his anxiety is not a 10/10; also states that depression is getting a little bit better)  Eye Contact Fair  Facial Expression Anxious  Affect Appropriate to circumstance  Speech Logical/coherent  Interaction Assertive;Isolative (isolates to room, except for meals and medication)  Motor Activity Slow  Appearance/Hygiene Disheveled;Poor hygiene;In scrubs  Behavior Characteristics Cooperative;Appropriate to situation  Mood Anxious;Pleasant  Aggressive Behavior  Effect No apparent injury  Thought Process  Coherency WDL  Content WDL  Delusions None reported or observed  Perception Hallucinations  Hallucination Auditory  Judgment WDL  Confusion None  Danger to Self  Current suicidal ideation? Denies (patient states "not at the moment".)  Self-Injurious Behavior No self-injurious ideation or behavior indicators observed or expressed   Agreement Not to Harm Self Yes  Description of Agreement Verbal  Danger to Others  Danger to Others None reported or observed

## 2023-09-04 NOTE — Progress Notes (Signed)
Patient given PRN medication for anxiety and auditory hallucinations. Patient tolerated medication administration well, without any issues.    09/04/23 0836  Pain Assessment  Pain Scale 0-10  Pain Score 0  Complaints & Interventions  Complains of Anxiety;Hallucinations  Interventions Medication (see MAR)  Neuro symptoms relieved by Anti-anxiety medication

## 2023-09-04 NOTE — Progress Notes (Deleted)
   09/04/23 1600  Psych Admission Type (Psych Patients Only)  Admission Status Voluntary  Psychosocial Assessment  Patient Complaints Anxiety;Depression;Sleep disturbance;Other (Comment) (hallucinations; patient states his anxiety is not a 10/10; also states that depression is getting a littlt bit better)  Eye Contact Fair  Facial Expression Anxious  Affect Appropriate to circumstance  Speech Logical/coherent  Interaction Assertive;Isolative (isolates to room, except for meals and medication)  Motor Activity Slow  Appearance/Hygiene Disheveled;Poor hygiene;In scrubs  Behavior Characteristics Cooperative;Appropriate to situation  Mood Anxious;Pleasant  Aggressive Behavior  Effect No apparent injury  Thought Process  Coherency WDL  Content WDL  Delusions None reported or observed  Perception Hallucinations  Hallucination Auditory  Judgment WDL  Confusion None  Danger to Self  Current suicidal ideation? Denies (patient states "not at the moment".)  Self-Injurious Behavior No self-injurious ideation or behavior indicators observed or expressed   Agreement Not to Harm Self Yes  Description of Agreement Verbal  Danger to Others  Danger to Others None reported or observed

## 2023-09-04 NOTE — Progress Notes (Signed)
Pt calm and pleasant during assessment denying SI/HI/AVH. Pt minimal with staff and peers. Pt compliant with medication administration per MD orders. Pt given education, support, and encouragement to be active in his treatment plan. Pt being monitored Q 15 minutes for safety per unit protocol, remains safe on the unit

## 2023-09-05 MED ORDER — ESCITALOPRAM OXALATE 10 MG PO TABS
20.0000 mg | ORAL_TABLET | Freq: Every day | ORAL | Status: DC
  Administered 2023-09-06 – 2023-09-07 (×2): 20 mg via ORAL
  Filled 2023-09-05 (×2): qty 2

## 2023-09-05 MED ORDER — DIVALPROEX SODIUM ER 500 MG PO TB24
500.0000 mg | ORAL_TABLET | Freq: Every day | ORAL | Status: DC
  Administered 2023-09-06 – 2023-09-07 (×2): 500 mg via ORAL
  Filled 2023-09-05 (×2): qty 1

## 2023-09-05 MED ORDER — OLANZAPINE 5 MG PO TBDP
5.0000 mg | ORAL_TABLET | Freq: Once | ORAL | Status: AC
  Administered 2023-09-05: 5 mg via ORAL
  Filled 2023-09-05: qty 1

## 2023-09-05 NOTE — Group Note (Signed)
[facility] LCSW Group Therapy Note    Group Date: 09/05/2023 Start Time: 1315 End Time: 1415  Type of Therapy and Topic:  Group Therapy:  Overcoming Obstacles  Participation Level:  [facility] PARTICIPATION LEVEL: Active  Mood:  Description of Group:   In this group patients will be encouraged to explore what they see as obstacles to their own wellness and recovery. They will be guided to discuss their thoughts, feelings, and behaviors related to these obstacles. The group will process together ways to cope with barriers, with attention given to specific choices patients can make. Each patient will be challenged to identify changes they are motivated to make in order to overcome their obstacles. This group will be process-oriented, with patients participating in exploration of their own experiences as well as giving and receiving support and challenge from other group members.  Therapeutic Goals: 1. Patient will identify personal and current obstacles as they relate to admission. 2. Patient will identify barriers that currently interfere with their wellness or overcoming obstacles.  3. Patient will identify feelings, thought process and behaviors related to these barriers. 4. Patient will identify two changes they are willing to make to overcome these obstacles:    Summary of Patient Progress Patient was present in group.  Patient was an active participant.  Patient engaged in how he has the obstacle of having a person as his "comfort".  CSW spoke with patient on how this can be a barrier and lends its ear to co-dependency.  Patient agreed and stated that he is attempting to make a change.  Patient was appropriate and displayed fair insight.     Therapeutic Modalities:   Cognitive Behavioral Therapy Solution Focused Therapy Motivational Interviewing Relapse Prevention Therapy   Harden Mo, LCSW

## 2023-09-05 NOTE — Plan of Care (Signed)
Patient stated that he is doing much better. Patient pleasant and cooperative on approach. Patient set his goal to attend groups today and he made it. Patient had PRN for anxiety x 2. Another patient in the unit triggers his anxiety. Patient denies SI,HI and AVH. Appetite and energy level good. Support and encouragement given.

## 2023-09-05 NOTE — Group Note (Signed)
Recreation Therapy Group Note   Group Topic:Coping Skills  Group Date: 09/05/2023 Start Time: 1000 End Time: 1100 Facilitators: Rosina Lowenstein, LRT, CTRS Location:  Craft Room  Group Description: Mind Map.  Patient was provided a blank template of a diagram with 32 blank boxes in a tiered system, branching from the center (similar to a bubble chart). LRT directed patients to label the middle of the diagram "Coping Skills". LRT and patients then came up with 8 different coping skills as examples. Pt were directed to record their coping skills in the 2nd tier boxes closest to the center.  Patients would then share their coping skills with the group as LRT wrote them out. LRT gave a handout of 99 different coping skills at the end of group.   Goal Area(s) Addressed: Patients will be able to define "coping skills". Patient will identify new coping skills.  Patient will increase communication.   Affect/Mood: Appropriate   Participation Level: Active and Engaged   Participation Quality: Independent   Behavior: Calm and Cooperative   Speech/Thought Process: Coherent   Insight: Good and Improved   Judgement: Good   Modes of Intervention: Education, Exploration, Guided Discussion, and Worksheet   Patient Response to Interventions:  Attentive, Engaged, Interested , and Receptive   Education Outcome:  Acknowledges education   Clinical Observations/Individualized Feedback: Wallice was active in their participation of session activities and group discussion. Pt identified "being with friends or family, work, tv" as Pharmacologist. Pt spontaneously contributed to group discussion while interacting well with LRT and peers duration of session.    Plan: Continue to engage patient in RT group sessions 2-3x/week.   Rosina Lowenstein, LRT, CTRS 09/05/2023 1:12 PM

## 2023-09-05 NOTE — Group Note (Signed)
Date:  09/05/2023 Time:  9:53 PM  Group Topic/Focus:  Wrap-Up Group:   The focus of this group is to help patients review their daily goal of treatment and discuss progress on daily workbooks.    Participation Level:  Active  Participation Quality:  Appropriate and Attentive  Affect:  Appropriate  Cognitive:  Alert and Appropriate  Insight: Appropriate  Engagement in Group:  Engaged  Modes of Intervention:  Discussion  Additional Comments:     Maglione,Deandrae Wajda E 09/05/2023, 9:53 PM
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
## 2023-09-05 NOTE — Plan of Care (Signed)
Pt denies SI/HI/AVH  Problem: Education: Goal: Knowledge of [facility] [facility] Education information/materials will improve Outcome: Progressing Goal: Emotional status will improve Outcome: Progressing Goal: Mental status will improve Outcome: Progressing Goal: Verbalization of understanding the information provided will improve Outcome: Progressing   Problem: Activity: Goal: Interest or engagement in activities will improve Outcome: Progressing Goal: Sleeping patterns will improve Outcome: Progressing   Problem: Coping: Goal: Ability to verbalize frustrations and anger appropriately will improve Outcome: Progressing Goal: Ability to demonstrate self-control will improve Outcome: Progressing   Problem: Health Behavior/Discharge Planning: Goal: Identification of resources available to assist in meeting health care needs will improve Outcome: Progressing Goal: Compliance with treatment plan for underlying cause of condition will improve Outcome: Progressing   Problem: Physical Regulation: Goal: Ability to maintain clinical measurements within normal limits will improve Outcome: Progressing   Problem: Safety: Goal: Periods of time without injury will increase Outcome: Progressing   Problem: Education: Goal: Knowledge of [place] [facility] Education information/materials will improve Outcome: Progressing Goal: Emotional status will improve Outcome: Progressing Goal: Mental status will improve Outcome: Progressing Goal: Verbalization of understanding the information provided will improve Outcome: Progressing   Problem: Activity: Goal: Interest or engagement in activities will improve Outcome: Progressing Goal: Sleeping patterns will improve Outcome: Progressing   Problem: Coping: Goal: Ability to verbalize frustrations and anger appropriately will improve Outcome: Progressing Goal: Ability to demonstrate self-control will improve Outcome: Progressing    Problem: Health Behavior/Discharge Planning: Goal: Identification of resources available to assist in meeting health care needs will improve Outcome: Progressing Goal: Compliance with treatment plan for underlying cause of condition will improve Outcome: Progressing   Problem: Physical Regulation: Goal: Ability to maintain clinical measurements within normal limits will improve Outcome: Progressing   Problem: Safety: Goal: Periods of time without injury will increase Outcome: Progressing   Problem: Education: Goal: Knowledge of disease or condition will improve Outcome: Progressing Goal: Understanding of discharge needs will improve Outcome: Progressing   Problem: Health Behavior/Discharge Planning: Goal: Ability to identify changes in lifestyle to reduce recurrence of condition will improve Outcome: Progressing Goal: Identification of resources available to assist in meeting health care needs will improve Outcome: Progressing   Problem: Physical Regulation: Goal: Complications related to the disease process, condition or treatment will be avoided or minimized Outcome: Progressing   Problem: Safety: Goal: Ability to remain free from injury will improve Outcome: Progressing

## 2023-09-05 NOTE — Group Note (Signed)
Recreation Therapy Group Note   Group Topic:General Recreation  Group Date: 09/05/2023 Start Time: 1530 End Time: 1645 Facilitators: Rosina Lowenstein, LRT, CTRS Location:  Dayroom  Group Description: Recreation. Patients were given the opportunity to play cards, journal, or listen to music during group. Pt identified and conversated about things they enjoy doing in their free time and how they can continue to do that outside of the hospital.  Goal Area(s) Addressed: Patient will practice making a positive decision. Patient will have the opportunity to try a new leisure activity. Patient will communicate with peers and LRT.   Affect/Mood: N/A   Participation Level: Did not attend    Clinical Observations/Individualized Feedback: Abdulrahim did not attend group.   Plan: Continue to engage patient in RT group sessions 2-3x/week.   Rosina Lowenstein, LRT, CTRS 09/05/2023 5:27 PM

## 2023-09-05 NOTE — Progress Notes (Signed)
Pt calm and pleasant during assessment denying SI/HI/AVH. Pt minimal with staff and peers. Pt compliant with medication administration per MD orders. Pt given education, support, and encouragement to be active in his treatment plan. Pt being monitored Q 15 minutes for safety per unit protocol, remains safe on the unit

## 2023-09-05 NOTE — BH IP Treatment Plan (Signed)
Interdisciplinary Treatment and Diagnostic Plan Update  09/05/2023 Time of Session: 9:00AM  Trevor [name] MRN: [id number]  Principal Diagnosis: Bipolar disorder, mixed (HCC)  Secondary Diagnoses: Principal Problem:   Bipolar disorder, mixed (HCC) Active Problems:   Polysubstance abuse (HCC)   Methamphetamine dependence (HCC)   Cannabis abuse, continuous use   Current Medications:  Current Facility-Administered Medications  Medication Dose Route Frequency Provider Last Rate Last Admin   acetaminophen (TYLENOL) tablet 650 mg  650 mg Oral Q6H PRN Sindy Guadeloupe, NP       alum & mag hydroxide-simeth (MAALOX/MYLANTA) 200-200-20 MG/5ML suspension 30 mL  30 mL Oral Q4H PRN Sindy Guadeloupe, NP       busPIRone (BUSPAR) tablet 7.5 mg  7.5 mg Oral BID Myriam Forehand, NP   7.5 mg at 09/05/23 0915   divalproex (DEPAKOTE ER) 24 hr tablet 250 mg  250 mg Oral Daily Myriam Forehand, NP   250 mg at 09/05/23 0914   escitalopram (LEXAPRO) tablet 10 mg  10 mg Oral Daily Myriam Forehand, NP   10 mg at 09/05/23 0915   feeding supplement (ENSURE ENLIVE / ENSURE PLUS) liquid 237 mL  1 Bottle Oral BID BM Myriam Forehand, NP   237 mL at 09/05/23 0920   gabapentin (NEURONTIN) capsule 100 mg  100 mg Oral BID Myriam Forehand, NP   100 mg at 09/05/23 0915   hydrOXYzine (ATARAX) tablet 50 mg  50 mg Oral Q6H PRN Myriam Forehand, NP   50 mg at 09/05/23 0914   magnesium hydroxide (MILK OF MAGNESIA) suspension 30 mL  30 mL Oral Daily PRN Sindy Guadeloupe, NP       melatonin tablet 5 mg  5 mg Oral QHS Myriam Forehand, NP   5 mg at 09/04/23 2131   nicotine (NICODERM CQ - dosed in mg/24 hr) patch 7 mg  7 mg Transdermal Daily Lewanda Rife, MD   7 mg at 09/05/23 4034   nicotine polacrilex (NICORETTE) gum 2 mg  2 mg Oral PRN Myriam Forehand, NP   2 mg at 09/01/23 0905   OLANZapine (ZYPREXA) injection 10 mg  10 mg Intramuscular TID PRN Sindy Guadeloupe, NP       OLANZapine (ZYPREXA) injection 5 mg  5 mg Intramuscular TID PRN Sindy Guadeloupe, NP        OLANZapine zydis (ZYPREXA) disintegrating tablet 5 mg  5 mg Oral TID PRN Sindy Guadeloupe, NP   5 mg at 09/04/23 7425   QUEtiapine (SEROQUEL) tablet 200 mg  200 mg Oral QHS Myriam Forehand, NP   200 mg at 09/04/23 2131   PTA Medications: Medications Prior to Admission  Medication Sig Dispense Refill Last Dose/Taking   acetaminophen (TYLENOL) 325 MG tablet Take 2 tablets (650 mg total) by mouth every 6 (six) hours as needed for mild pain (pain score 1-3), moderate pain (pain score 4-6) or fever.      gabapentin (NEURONTIN) 400 MG capsule Take 1 capsule (400 mg total) by mouth 3 (three) times daily. 45 capsule 1    hydrOXYzine (ATARAX) 50 MG tablet Take 1 tablet (50 mg total) by mouth every 6 (six) hours as needed for anxiety. 15 tablet 0    QUEtiapine (SEROQUEL) 100 MG tablet Take 1 tablet (100 mg total) by mouth at bedtime. (Patient not taking: Reported on 08/05/2023) 30 tablet 1     Patient Stressors: Medication change or noncompliance   Substance abuse    Patient Strengths: Capable of  independent living  Communication skills  General fund of knowledge  Motivation for treatment/growth   Treatment Modalities: Medication Management, Group therapy, Case management,  1 to 1 session with clinician, Psychoeducation, Recreational therapy.   Physician Treatment Plan for Primary Diagnosis: Bipolar disorder, mixed (HCC) Long Term Goal(s): Improvement in symptoms so as ready for discharge   Short Term Goals: Ability to identify changes in lifestyle to reduce recurrence of condition will improve Ability to verbalize feelings will improve Ability to disclose and discuss suicidal ideas Ability to demonstrate self-control will improve Ability to identify and develop effective coping behaviors will improve Ability to maintain clinical measurements within normal limits will improve Compliance with prescribed medications will improve Ability to identify triggers associated with substance  abuse/mental health issues will improve  Medication Management: Evaluate patient's response, side effects, and tolerance of medication regimen.  Therapeutic Interventions: 1 to 1 sessions, Unit Group sessions and Medication administration.  Evaluation of Outcomes: Not Met  Physician Treatment Plan for Secondary Diagnosis: Principal Problem:   Bipolar disorder, mixed (HCC) Active Problems:   Polysubstance abuse (HCC)   Methamphetamine dependence (HCC)   Cannabis abuse, continuous use  Long Term Goal(s): Improvement in symptoms so as ready for discharge   Short Term Goals: Ability to identify changes in lifestyle to reduce recurrence of condition will improve Ability to verbalize feelings will improve Ability to disclose and discuss suicidal ideas Ability to demonstrate self-control will improve Ability to identify and develop effective coping behaviors will improve Ability to maintain clinical measurements within normal limits will improve Compliance with prescribed medications will improve Ability to identify triggers associated with substance abuse/mental health issues will improve     Medication Management: Evaluate patient's response, side effects, and tolerance of medication regimen.  Therapeutic Interventions: 1 to 1 sessions, Unit Group sessions and Medication administration.  Evaluation of Outcomes: Not Met   RN Treatment Plan for Primary Diagnosis: Bipolar disorder, mixed (HCC) Long Term Goal(s): Knowledge of disease and therapeutic regimen to maintain health will improve  Short Term Goals: Ability to remain free from injury will improve, Ability to verbalize frustration and anger appropriately will improve, Ability to demonstrate self-control, Ability to participate in decision making will improve, Ability to verbalize feelings will improve, Ability to disclose and discuss suicidal ideas, Ability to identify and develop effective coping behaviors will improve, and  Compliance with prescribed medications will improve    Medication Management: RN will administer medications as ordered by provider, will assess and evaluate patient's response and provide education to patient for prescribed medication. RN will report any adverse and/or side effects to prescribing provider.  Therapeutic Interventions: 1 on 1 counseling sessions, Psychoeducation, Medication administration, Evaluate responses to treatment, Monitor vital signs and CBGs as ordered, Perform/monitor CIWA, COWS, AIMS and Fall Risk screenings as ordered, Perform wound care treatments as ordered.  Evaluation of Outcomes: Not Met   LCSW Treatment Plan for Primary Diagnosis: Bipolar disorder, mixed (HCC) Long Term Goal(s): Safe transition to appropriate next level of care at discharge, Engage patient in therapeutic group addressing interpersonal concerns.  Short Term Goals: Engage patient in aftercare planning with referrals and resources, Increase social support, Increase ability to appropriately verbalize feelings, Increase emotional regulation, Facilitate acceptance of mental health diagnosis and concerns, Facilitate patient progression through stages of change regarding substance use diagnoses and concerns, Identify triggers associated with mental health/substance abuse issues, and Increase skills for wellness and recovery   Therapeutic Interventions: Assess for all discharge needs, 1 to 1 time with  Social worker, Explore available resources and support systems, Assess for adequacy in community support network, Educate family and significant other(s) on suicide prevention, Complete Psychosocial Assessment, Interpersonal group therapy.  Evaluation of Outcomes:  Not Met    Progress in Treatment: Attending groups: No. Participating in groups: No. Taking medication as prescribed: Yes. Toleration medication: Yes. Family/Significant other contact made:No, will contact:  when given permission.   09/05/23  Update: The patient  has refused to provide written consent for family/significant other to be provided Family/Significant Other Suicide Prevention Education during admission and/or prior to discharge. Physician notified. SPE completed with pt, as pt refused to consent to family contact. SPI pamphlet provided to pt and pt was encouraged to share information with support network, ask questions, and talk about any concerns relating to SPE.   Patient understands diagnosis: Yes. Discussing patient identified problems/goals with staff: Yes. Medical problems stabilized or resolved: Yes. and No. Denies suicidal/homicidal ideation: Yes. Issues/concerns per patient self-inventory: No. Other: none 09/05/23 Update: Pt calm and pleasant while in treatment, denies SI/HI/AVH. Pt minimal with staff and peers. Patient not active in groups. Pt compliant with medication administration. Patient is interested in either placement in a shelter or inpatient treatment center. CSW to assess.   New problem(s) identified: No, Describe:  none identified. 09/05/23 Update: None identified.   New Short Term/Long Term Goal(s):detox, elimination of symptoms of psychosis, medication management for mood stabilization; elimination of SI thoughts; development of comprehensive mental wellness/sobriety plan. 09/05/23 Update: Goals to remain the same.   Patient Goals:  "To stay focused on what I need to do and get back to a normal life."  09/05/23 Update: Patient goal to remain the same.   Discharge Plan or Barriers: CSW will assist pt with development of an appropriate aftercare/discharge plan. 09/05/23 Update: Discharge plan to remain the same. CSW to confirm safe discharge.   Reason for Continuation of Hospitalization:  Aggression Medication stabilization Suicidal ideation Withdrawal symptoms  Estimated Length of Stay: 1-7 days 09/05/23 Update: TBD  Last 3 [place] Suicide Severity Risk Score: Flowsheet Row Admission (Current)  from 08/31/2023 in [facility] INPATIENT BEHAVIORAL MEDICINE ED from 08/30/2023 in [facility] Department at [facility] ED to Hosp-Admission (Discharged) from 08/06/2023 in [facility]  SURGICAL  C-SSRS RISK CATEGORY Low Risk Low Risk No Risk       Last PHQ 2/9 Scores:     No data to display          Scribe for Treatment Team: Lowry Ram, LCSW 09/05/2023 11:12 AM

## 2023-09-06 ENCOUNTER — Inpatient Hospital Stay: Payer: BLUE CROSS/BLUE SHIELD | Admitting: Internal Medicine

## 2023-09-06 DIAGNOSIS — F15259 Other stimulant dependence with stimulant-induced psychotic disorder, unspecified: Secondary | ICD-10-CM | POA: Diagnosis present

## 2023-09-06 DIAGNOSIS — F19259 Other psychoactive substance dependence with psychoactive substance-induced psychotic disorder, unspecified: Secondary | ICD-10-CM

## 2023-09-06 DIAGNOSIS — F319 Bipolar disorder, unspecified: Secondary | ICD-10-CM

## 2023-09-06 DIAGNOSIS — F1210 Cannabis abuse, uncomplicated: Secondary | ICD-10-CM

## 2023-09-06 MED ORDER — QUETIAPINE FUMARATE 200 MG PO TABS
200.0000 mg | ORAL_TABLET | Freq: Every day | ORAL | Status: DC
  Administered 2023-09-06: 200 mg via ORAL
  Filled 2023-09-06: qty 1

## 2023-09-06 MED ORDER — ZIPRASIDONE MESYLATE 20 MG IM SOLR: 20.0000 mg | Freq: Two times a day (BID) | INTRAMUSCULAR | Status: DC | PRN

## 2023-09-06 MED ORDER — ZIPRASIDONE MESYLATE 20 MG IM SOLR: 20.0000 mg | Freq: Once | INTRAMUSCULAR | Status: DC

## 2023-09-06 MED ORDER — LORAZEPAM 2 MG/ML IJ SOLN
1.0000 mg | Freq: Once | INTRAMUSCULAR | Status: AC
  Administered 2023-09-06: 1 mg via INTRAMUSCULAR
  Filled 2023-09-06: qty 1

## 2023-09-06 MED ORDER — LORAZEPAM 1 MG PO TABS: 1.0000 mg | ORAL_TABLET | ORAL | Status: DC | PRN

## 2023-09-06 MED ORDER — QUETIAPINE FUMARATE 100 MG PO TABS: 100.0000 mg | ORAL_TABLET | Freq: Every day | ORAL | Status: DC

## 2023-09-06 NOTE — Progress Notes (Signed)
Patient is found in bed, asleep due to having an eventful night. This writer will administer scheduled morning medication once he wakes up. Lord, NP was notified face-to-face.

## 2023-09-06 NOTE — Progress Notes (Signed)
Pt calm and pleasant during assessment denying SI/HI/AVH. Pt minimal with staff and peers. Pt compliant with medication administration per MD orders. Pt given education, support, and encouragement to be active in his treatment plan. Pt being monitored Q 15 minutes for safety per unit protocol, remains safe on the unit

## 2023-09-06 NOTE — Group Note (Signed)
Date:  09/06/2023 Time:  3:36 PM  Group Topic/Focus:  Activity Group:  The focus of the group is to promote activity for the patients to encourage them to go outside to the courtyard for some fresh air and some exercise.    Participation Level:  Active  Participation Quality:  Appropriate  Affect:  Appropriate  Cognitive:  Appropriate  Insight: Appropriate  Engagement in Group:  Engaged  Modes of Intervention:  Activity  Additional Comments:    [name] 09/06/2023, 3:36 PM

## 2023-09-06 NOTE — Plan of Care (Signed)
  Problem: Education: Goal: Knowledge of [facility] Education information/materials will improve Outcome: Not Progressing Goal: Emotional status will improve Outcome: Not Progressing Goal: Mental status will improve Outcome: Not Progressing Goal: Verbalization of understanding the information provided will improve Outcome: Not Progressing   Problem: Activity: Goal: Interest or engagement in activities will improve Outcome: Not Progressing Goal: Sleeping patterns will improve Outcome: Not Progressing   Problem: Coping: Goal: Ability to verbalize frustrations and anger appropriately will improve Outcome: Not Progressing Goal: Ability to demonstrate self-control will improve Outcome: Not Progressing   Problem: Health Behavior/Discharge Planning: Goal: Identification of resources available to assist in meeting health care needs will improve Outcome: Not Progressing Goal: Compliance with treatment plan for underlying cause of condition will improve Outcome: Not Progressing   Problem: Physical Regulation: Goal: Ability to maintain clinical measurements within normal limits will improve Outcome: Not Progressing   Problem: Safety: Goal: Periods of time without injury will increase Outcome: Not Progressing   Problem: Education: Goal: Knowledge of [facility] Education information/materials will improve Outcome: Not Progressing Goal: Emotional status will improve Outcome: Not Progressing Goal: Mental status will improve Outcome: Not Progressing Goal: Verbalization of understanding the information provided will improve Outcome: Not Progressing   Problem: Activity: Goal: Interest or engagement in activities will improve Outcome: Not Progressing Goal: Sleeping patterns will improve Outcome: Not Progressing   Problem: Coping: Goal: Ability to verbalize frustrations and anger appropriately will improve Outcome: Not Progressing Goal: Ability to demonstrate  self-control will improve Outcome: Not Progressing   Problem: Health Behavior/Discharge Planning: Goal: Identification of resources available to assist in meeting health care needs will improve Outcome: Not Progressing Goal: Compliance with treatment plan for underlying cause of condition will improve Outcome: Not Progressing   Problem: Physical Regulation: Goal: Ability to maintain clinical measurements within normal limits will improve Outcome: Not Progressing   Problem: Safety: Goal: Periods of time without injury will increase Outcome: Not Progressing   Problem: Education: Goal: Knowledge of disease or condition will improve Outcome: Not Progressing Goal: Understanding of discharge needs will improve Outcome: Not Progressing   Problem: Health Behavior/Discharge Planning: Goal: Ability to identify changes in lifestyle to reduce recurrence of condition will improve Outcome: Not Progressing Goal: Identification of resources available to assist in meeting health care needs will improve Outcome: Not Progressing   Problem: Physical Regulation: Goal: Complications related to the disease process, condition or treatment will be avoided or minimized Outcome: Not Progressing   Problem: Safety: Goal: Ability to remain free from injury will improve Outcome: Not Progressing

## 2023-09-06 NOTE — Plan of Care (Signed)
Pt denies SI/HI/AVH  Problem: Education: Goal: Knowledge of [facility] [facility] Education information/materials will improve Outcome: Progressing Goal: Emotional status will improve Outcome: Progressing Goal: Mental status will improve Outcome: Progressing Goal: Verbalization of understanding the information provided will improve Outcome: Progressing   Problem: Activity: Goal: Interest or engagement in activities will improve Outcome: Progressing Goal: Sleeping patterns will improve Outcome: Progressing   Problem: Coping: Goal: Ability to verbalize frustrations and anger appropriately will improve Outcome: Progressing Goal: Ability to demonstrate self-control will improve Outcome: Progressing   Problem: Health Behavior/Discharge Planning: Goal: Identification of resources available to assist in meeting health care needs will improve Outcome: Progressing Goal: Compliance with treatment plan for underlying cause of condition will improve Outcome: Progressing   Problem: Physical Regulation: Goal: Ability to maintain clinical measurements within normal limits will improve Outcome: Progressing   Problem: Safety: Goal: Periods of time without injury will increase Outcome: Progressing   Problem: Education: Goal: Knowledge of [place] [facility] Education information/materials will improve Outcome: Progressing Goal: Emotional status will improve Outcome: Progressing Goal: Mental status will improve Outcome: Progressing Goal: Verbalization of understanding the information provided will improve Outcome: Progressing   Problem: Activity: Goal: Interest or engagement in activities will improve Outcome: Progressing Goal: Sleeping patterns will improve Outcome: Progressing   Problem: Coping: Goal: Ability to verbalize frustrations and anger appropriately will improve Outcome: Progressing Goal: Ability to demonstrate self-control will improve Outcome: Progressing    Problem: Health Behavior/Discharge Planning: Goal: Identification of resources available to assist in meeting health care needs will improve Outcome: Progressing Goal: Compliance with treatment plan for underlying cause of condition will improve Outcome: Progressing   Problem: Physical Regulation: Goal: Ability to maintain clinical measurements within normal limits will improve Outcome: Progressing   Problem: Safety: Goal: Periods of time without injury will increase Outcome: Progressing   Problem: Education: Goal: Knowledge of disease or condition will improve Outcome: Progressing Goal: Understanding of discharge needs will improve Outcome: Progressing   Problem: Health Behavior/Discharge Planning: Goal: Ability to identify changes in lifestyle to reduce recurrence of condition will improve Outcome: Progressing Goal: Identification of resources available to assist in meeting health care needs will improve Outcome: Progressing   Problem: Physical Regulation: Goal: Complications related to the disease process, condition or treatment will be avoided or minimized Outcome: Progressing   Problem: Safety: Goal: Ability to remain free from injury will improve Outcome: Progressing

## 2023-09-06 NOTE — Group Note (Signed)
LCSW Group Therapy Note   Group Date: 09/06/2023 Start Time: 1300 End Time: 1405   Type of Therapy and Topic:  Group Therapy: Challenging Core Beliefs  Participation Level:  Did Not Attend  Description of Group:  Patients were educated about core beliefs and asked to identify one harmful core belief that they have. Patients were asked to explore from where those beliefs originate. Patients were asked to discuss how those beliefs make them feel and the resulting behaviors of those beliefs. They were then be asked if those beliefs are true and, if so, what evidence they have to support them. Lastly, group members were challenged to replace those negative core beliefs with helpful beliefs.   Therapeutic Goals:   1. Patient will identify harmful core beliefs and explore the origins of such beliefs. 2. Patient will identify feelings and behaviors that result from those core beliefs. 3. Patient will discuss whether such beliefs are true. 4.  Patient will replace harmful core beliefs with helpful ones.  Summary of Patient Progress:  Patient did not attend.   Therapeutic Modalities: Cognitive Behavioral Therapy; Solution-Focused Therapy   Perrin Smack 09/06/2023  2:43 PM
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
## 2023-09-06 NOTE — Progress Notes (Signed)
Pt became agitated, yelling because of the patient in room 319. Pt given PRN medication for agitation. Check MAR. Pt continued to be monitored Q 15 minutes for safety

## 2023-09-06 NOTE — Progress Notes (Signed)
   09/06/23 1200  Psych Admission Type (Psych Patients Only)  Admission Status Voluntary  Psychosocial Assessment  Patient Complaints Anxiety;Depression;Hopelessness;Self-harm thoughts;Sleep disturbance  Eye Contact Fair;Watchful  Facial Expression Anxious  Affect Preoccupied  Speech Logical/coherent  Interaction Assertive  Motor Activity Slow  Appearance/Hygiene Unremarkable  Behavior Characteristics Cooperative;Appropriate to situation  Mood Preoccupied;Pleasant  Aggressive Behavior  Effect No apparent injury  Thought Process  Coherency Circumstantial  Content Preoccupation;Blaming others  Delusions None reported or observed  Perception Hallucinations  Hallucination Auditory (per patient "I can't tell the difference between the voices and that patient's voice", regarding a disruptive patient on the unit from last night.)  Judgment Impaired  Confusion None  Danger to Self  Current suicidal ideation? Passive (per patient "just because of Iast night and I was tired of the bullshit".)  Self-Injurious Behavior Some self-injurious ideation observed or expressed.  No lethal plan expressed   Agreement Not to Harm Self Yes  Description of Agreement Verbal  Danger to Others  Danger to Others None reported or observed

## 2023-09-06 NOTE — Progress Notes (Signed)
Patient given PRN medication to help with relief.   09/06/23 1154  Pain Assessment  Pain Scale 0-10  Pain Score 0  Complaints & Interventions  Complains of Anxiety;Hallucinations  Interventions Medication (see MAR)  Neuro symptoms relieved by Anti-anxiety medication

## 2023-09-06 NOTE — Group Note (Signed)
Recreation Therapy Group Note   Group Topic:Goal Setting  Group Date: 09/06/2023 Start Time: 1000 End Time: 1100 Facilitators: Rosina Lowenstein, LRT, CTRS Location:  Craft Room  Group Description: Product/process development scientist. Patients were given many different magazines, a glue stick, markers, and a piece of cardstock paper. LRT and pts discussed the importance of having goals in life. LRT and pts discussed the difference between short-term and long-term goals, as well as what a SMART goal is. LRT encouraged pts to create a vision board, with images they picked and then cut out with safety scissors from the magazine, for themselves, that capture their short and long-term goals. LRT encouraged pts to show and explain their vision board to the group.   Goal Area(s) Addressed:  Patient will gain knowledge of short vs. long term goals.  Patient will identify goals for themselves. Patient will practice setting SMART goals. Patient will verbalize their goals to LRT and peers.   Affect/Mood: N/A   Participation Level: Did not attend    Clinical Observations/Individualized Feedback: [place]iman did not attend group.  Plan: Continue to engage patient in RT group sessions 2-3x/week.   Rosina Lowenstein, LRT, CTRS 09/06/2023 1:11 PM
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
## 2023-09-06 NOTE — Progress Notes (Signed)
Patient refused labs today, stating "I'm leaving tomorrow". NP was notified face-to-face.

## 2023-09-06 NOTE — Group Note (Signed)
Recreation Therapy Group Note   Group Topic:Other  Group Date: 09/06/2023 Start Time: 1530 End Time: 1630 Facilitators: Clinton Gallant, CTRS Location:  Craft Room  LRT, nurses, social workers, Facilities manager, and patients got together for [employer]. LRT provided Christmas theme coloring pages, as well. Everyone voluntarily took turns singing and talking about their holiday traditions.    Affect/Mood: Appropriate   Participation Level: Moderate   Modes of Intervention: Activity    Clinical Observations/Individualized Feedback: Ifeanyi came late to group. Pt minimally interacted with LRT or peers while present. Pt sat and listened to music and people sing while in group.    Plan: Continue to engage patient in RT group sessions 2-3x/week.   Rosina Lowenstein, LRT, CTRS 09/06/2023 5:50 PM

## 2023-09-07 ENCOUNTER — Ambulatory Visit: Payer: BLUE CROSS/BLUE SHIELD | Admitting: Internal Medicine

## 2023-09-07 DIAGNOSIS — F15259 Other stimulant dependence with stimulant-induced psychotic disorder, unspecified: Secondary | ICD-10-CM

## 2023-09-07 MED ORDER — QUETIAPINE FUMARATE 200 MG PO TABS: 200.0000 mg | ORAL_TABLET | Freq: Every day | ORAL | 0 refills | Status: DC

## 2023-09-07 MED ORDER — ESCITALOPRAM OXALATE 20 MG PO TABS: 20.0000 mg | ORAL_TABLET | Freq: Every day | ORAL | 0 refills | Status: DC

## 2023-09-07 MED ORDER — GABAPENTIN 100 MG PO CAPS: 100.0000 mg | ORAL_CAPSULE | Freq: Two times a day (BID) | ORAL | 0 refills | Status: DC

## 2023-09-07 MED ORDER — BUSPIRONE HCL 7.5 MG PO TABS: 7.5000 mg | ORAL_TABLET | Freq: Two times a day (BID) | ORAL | 0 refills | Status: DC

## 2023-09-07 NOTE — [facility] Suicide Risk Assessment (Signed)
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  Constitutional:      Appearance: Normal appearance.  HENT:     Head: Normocephalic.     Nose: Nose normal.  Pulmonary:     Effort: Pulmonary effort is normal.  Musculoskeletal:        General: Normal range of motion.     Cervical back: Normal range of motion.  Neurological:     General: No focal deficit present.     Mental Status: He is alert and oriented to person, place, and time.     Review of Systems  Psychiatric/Behavioral:  Positive for substance abuse.   All other systems reviewed and are negative.   Blood pressure 129/62, pulse 75, temperature 99 F (37.2 C), resp. rate 17, height 5\' 11"  (1.803 m), weight 71.7 kg, SpO2 96%.Body mass index is 22.04 kg/m.  General Appearance: Casual  Eye Contact:  Good  Speech:  Normal Rate  Volume:  Normal  Mood:  Euthymic  Affect:  Congruent  Thought Process:  Coherent and Descriptions of Associations: Intact  Orientation:  Full (Time, Place, and Person)  Thought Content:  WDL and Logical  Suicidal Thoughts:  No  Homicidal Thoughts:  No  Memory:  Immediate;   Good Recent;   Good Remote;   Good  Judgement:  Fair  Insight:  Fair  Psychomotor Activity:  Normal  Concentration:  Concentration: Good and Attention Span: Good   Recall:  Good  Fund of Knowledge:  Fair  Language:  Good  Akathisia:  No  Handed:  Right  AIMS (if indicated):     Assets:  Leisure Time Physical Health Resilience Social Support  ADL's:  Intact  Cognition:  WNL  Sleep:        Physical Exam: Physical Exam Vitals and nursing note reviewed.  Constitutional:      Appearance: Normal appearance.  HENT:     Head: Normocephalic.     Nose: Nose normal.  Pulmonary:     Effort: Pulmonary effort is normal.  Musculoskeletal:        General: Normal range of motion.     Cervical back: Normal range of motion.  Neurological:     General: No focal deficit present.     Mental Status: He is alert and oriented to person, place, and time.    Review of Systems  Psychiatric/Behavioral:  Positive for substance abuse.   All other systems reviewed and are negative.  Blood pressure 129/62, pulse 75, temperature 99 F (37.2 C), resp. rate 17, height 5\' 11"  (1.803 m), weight 71.7 kg, SpO2 96%. Body mass index is 22.04 kg/m.  Mental Status Per Nursing Assessment::   On Admission:  NA  Demographic Factors:  Male and Caucasian  Loss Factors: NA  Historical Factors: NA  Risk Reduction Factors:   Positive social support and Positive coping skills or problem solving skills  Continued Clinical Symptoms:  None  Cognitive  Features That Contribute To Risk:  None    Suicide Risk:  Minimal: No identifiable suicidal ideation.  Patients presenting with no risk factors but with morbid ruminations; may be classified as minimal risk based on the severity of the depressive symptoms   Follow-up Information     [facility] Follow up.   Why: Appointment is scheduled for 09/12/2023 at 1:30PM.  Appointment is virtual. Contact information: [address]  [address] [place] [address] [phone]                 Plan Of Care/Follow-up recommendations:  Activity:  as tolerated Diet:  heart healthy diet Methamphetamine-induced  psychotic disorder with moderate or severe use disorder" Gabapentin 100 mg BID Seroquel 200 mg at bedtime   Anxiety: Hydroxyzine 50 mg every 6 hours PRN Buspar 7.5 mg BID  Nanine Means, NP 09/07/2023, 12:16 PM

## 2023-09-07 NOTE — Plan of Care (Signed)
  Problem: Education: Goal: Knowledge of [facility] Education information/materials will improve Outcome: Adequate for Discharge Goal: Emotional status will improve Outcome: Adequate for Discharge Goal: Mental status will improve Outcome: Adequate for Discharge Goal: Verbalization of understanding the information provided will improve Outcome: Adequate for Discharge   Problem: Activity: Goal: Interest or engagement in activities will improve Outcome: Adequate for Discharge Goal: Sleeping patterns will improve Outcome: Adequate for Discharge   Problem: Coping: Goal: Ability to verbalize frustrations and anger appropriately will improve Outcome: Adequate for Discharge Goal: Ability to demonstrate self-control will improve Outcome: Adequate for Discharge   Problem: Health Behavior/Discharge Planning: Goal: Identification of resources available to assist in meeting health care needs will improve Outcome: Adequate for Discharge Goal: Compliance with treatment plan for underlying cause of condition will improve Outcome: Adequate for Discharge   Problem: Physical Regulation: Goal: Ability to maintain clinical measurements within normal limits will improve Outcome: Adequate for Discharge   Problem: Safety: Goal: Periods of time without injury will increase Outcome: Adequate for Discharge   Problem: Education: Goal: Knowledge of [facility] Education information/materials will improve Outcome: Adequate for Discharge Goal: Emotional status will improve Outcome: Adequate for Discharge Goal: Mental status will improve Outcome: Adequate for Discharge Goal: Verbalization of understanding the information provided will improve Outcome: Adequate for Discharge   Problem: Activity: Goal: Interest or engagement in activities will improve Outcome: Adequate for Discharge Goal: Sleeping patterns will improve Outcome: Adequate for Discharge   Problem: Coping: Goal:  Ability to verbalize frustrations and anger appropriately will improve Outcome: Adequate for Discharge Goal: Ability to demonstrate self-control will improve Outcome: Adequate for Discharge   Problem: Health Behavior/Discharge Planning: Goal: Identification of resources available to assist in meeting health care needs will improve Outcome: Adequate for Discharge Goal: Compliance with treatment plan for underlying cause of condition will improve Outcome: Adequate for Discharge   Problem: Physical Regulation: Goal: Ability to maintain clinical measurements within normal limits will improve Outcome: Adequate for Discharge   Problem: Safety: Goal: Periods of time without injury will increase Outcome: Adequate for Discharge   Problem: Education: Goal: Knowledge of disease or condition will improve Outcome: Adequate for Discharge Goal: Understanding of discharge needs will improve Outcome: Adequate for Discharge   Problem: Health Behavior/Discharge Planning: Goal: Ability to identify changes in lifestyle to reduce recurrence of condition will improve Outcome: Adequate for Discharge Goal: Identification of resources available to assist in meeting health care needs will improve Outcome: Adequate for Discharge   Problem: Physical Regulation: Goal: Complications related to the disease process, condition or treatment will be avoided or minimized Outcome: Adequate for Discharge   Problem: Safety: Goal: Ability to remain free from injury will improve Outcome: Adequate for Discharge

## 2023-09-07 NOTE — Progress Notes (Signed)
Patient ID: Trevor [name], male   DOB: September 12, 1981, 42 y.o.   MRN: [id number]  Discharge Note:  Patient denies SI/HI/AVH at this time. Discharge instructions, AVS, prescriptions, and transition record gone over with patient. Patient given a copy of his Suicide Safety Plan. Patient agrees to comply with medication management, follow-up visit, and outpatient therapy. Patient belongings returned to patient. Patient questions and concerns addressed and answered. Patient ambulatory off unit. Patient discharged to home via [employer] services.

## 2023-09-07 NOTE — Progress Notes (Signed)
  [facility] Adult Case Management Discharge Plan :  Will you be returning to the same living situation after discharge:  No. At discharge, do you have transportation home?: Yes,  pt reports that a peer is providing transportation. Do you have the ability to pay for your medications: Yes,  BLUE CROSS BLUE SHIELD / [employer] NON-PARTICIPATING OON  Release of information consent forms completed and in the chart;  Patient's signature needed at discharge.  Patient to Follow up at:  Follow-up Information     [facility] Follow up.   Why: Appointment is scheduled for 09/12/2023 at 1:30PM.  Appointment is virtual. Contact information: [address]  [address] [place] [address] [phone]                 Next level of care provider has access to [facility] Link:no  Safety Planning and Suicide Prevention discussed: Yes,  SPE completed with the patient.  Patient declined collateral contact.      Has patient been referred to the Quitline?: Patient refused referral for treatment  Patient has been referred for addiction treatment: Patient refused referral for treatment; referral information given to patient at discharge.  Harden Mo, LCSW 09/07/2023, 10:33 AM

## 2023-09-07 NOTE — Plan of Care (Signed)
Pt denies SI/HI/AVH, compliant with procedures on the unit  Problem: Education: Goal: Knowledge of [facility] Education information/materials will improve Outcome: Progressing Goal: Emotional status will improve Outcome: Progressing Goal: Mental status will improve Outcome: Progressing Goal: Verbalization of understanding the information provided will improve Outcome: Progressing   Problem: Activity: Goal: Interest or engagement in activities will improve Outcome: Progressing Goal: Sleeping patterns will improve Outcome: Progressing   Problem: Coping: Goal: Ability to verbalize frustrations and anger appropriately will improve Outcome: Progressing Goal: Ability to demonstrate self-control will improve Outcome: Progressing   Problem: Health Behavior/Discharge Planning: Goal: Identification of resources available to assist in meeting health care needs will improve Outcome: Progressing Goal: Compliance with treatment plan for underlying cause of condition will improve Outcome: Progressing   Problem: Physical Regulation: Goal: Ability to maintain clinical measurements within normal limits will improve Outcome: Progressing   Problem: Safety: Goal: Periods of time without injury will increase Outcome: Progressing   Problem: Education: Goal: Knowledge of [facility] Education information/materials will improve Outcome: Progressing Goal: Emotional status will improve Outcome: Progressing Goal: Mental status will improve Outcome: Progressing Goal: Verbalization of understanding the information provided will improve Outcome: Progressing   Problem: Activity: Goal: Interest or engagement in activities will improve Outcome: Progressing Goal: Sleeping patterns will improve Outcome: Progressing   Problem: Coping: Goal: Ability to verbalize frustrations and anger appropriately will improve Outcome: Progressing Goal: Ability to demonstrate self-control will  improve Outcome: Progressing   Problem: Health Behavior/Discharge Planning: Goal: Identification of resources available to assist in meeting health care needs will improve Outcome: Progressing Goal: Compliance with treatment plan for underlying cause of condition will improve Outcome: Progressing   Problem: Physical Regulation: Goal: Ability to maintain clinical measurements within normal limits will improve Outcome: Progressing   Problem: Safety: Goal: Periods of time without injury will increase Outcome: Progressing   Problem: Education: Goal: Knowledge of disease or condition will improve Outcome: Progressing Goal: Understanding of discharge needs will improve Outcome: Progressing   Problem: Health Behavior/Discharge Planning: Goal: Ability to identify changes in lifestyle to reduce recurrence of condition will improve Outcome: Progressing Goal: Identification of resources available to assist in meeting health care needs will improve Outcome: Progressing   Problem: Physical Regulation: Goal: Complications related to the disease process, condition or treatment will be avoided or minimized Outcome: Progressing   Problem: Safety: Goal: Ability to remain free from injury will improve Outcome: Progressing

## 2023-09-07 NOTE — Group Note (Signed)
Date:  09/07/2023 Time:  9:59 AM  Group Topic/Focus:  Goals Group:   The focus of this group is to help patients establish daily goals to achieve during treatment and discuss how the patient can incorporate goal setting into their daily lives to aide in recovery.    Participation Level:  Did Not Attend   Lynelle Smoke [facility] 09/07/2023, 9:59 AM

## 2023-09-07 NOTE — Progress Notes (Signed)
   09/07/23 0820  Psych Admission Type (Psych Patients Only)  Admission Status Voluntary  Psychosocial Assessment  Patient Complaints Depression;Anxiety (fear of the unknown d/t discharge)  Eye Contact Fair  Facial Expression Anxious  Affect Preoccupied  Speech Logical/coherent  Interaction Assertive  Motor Activity Slow  Appearance/Hygiene Unremarkable  Behavior Characteristics Cooperative  Mood Preoccupied  Aggressive Behavior  Effect No apparent injury  Thought Process  Coherency Circumstantial  Content Preoccupation;Other (Comment) (Aware of need to get life in order to create a better living environment)  Delusions None reported or observed  Perception Hallucinations  Hallucination Auditory  Judgment Impaired  Confusion None  Danger to Self  Current suicidal ideation? Denies  Self-Injurious Behavior No self-injurious ideation or behavior indicators observed or expressed   Agreement Not to Harm Self Yes  Description of Agreement Verbal  Danger to Others  Danger to Others None reported or observed
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
## 2023-09-17 ENCOUNTER — Emergency Department (EMERGENCY_DEPARTMENT_HOSPITAL)
Admission: EM | Admit: 2023-09-17 | Discharge: 2023-09-19 | Disposition: A | Discharge status: Other Acute Inpatient Hospital | Payer: BLUE CROSS/BLUE SHIELD | Source: Home / Self Care | Attending: Emergency Medicine | Admitting: Emergency Medicine

## 2023-09-17 ENCOUNTER — Other Ambulatory Visit: Payer: Self-pay

## 2023-09-17 ENCOUNTER — Encounter (HOSPITAL_COMMUNITY): Payer: Self-pay

## 2023-09-17 DIAGNOSIS — F1510 Other stimulant abuse, uncomplicated: Secondary | ICD-10-CM | POA: Insufficient documentation

## 2023-09-17 DIAGNOSIS — F15259 Other stimulant dependence with stimulant-induced psychotic disorder, unspecified: Secondary | ICD-10-CM | POA: Diagnosis present

## 2023-09-17 DIAGNOSIS — R45851 Suicidal ideations: Secondary | ICD-10-CM | POA: Insufficient documentation

## 2023-09-17 DIAGNOSIS — F1220 Cannabis dependence, uncomplicated: Secondary | ICD-10-CM | POA: Insufficient documentation

## 2023-09-17 DIAGNOSIS — F1210 Cannabis abuse, uncomplicated: Secondary | ICD-10-CM | POA: Diagnosis present

## 2023-09-17 DIAGNOSIS — F3130 Bipolar disorder, current episode depressed, mild or moderate severity, unspecified: Secondary | ICD-10-CM | POA: Insufficient documentation

## 2023-09-17 DIAGNOSIS — F3160 Bipolar disorder, current episode mixed, unspecified: Secondary | ICD-10-CM | POA: Diagnosis present

## 2023-09-17 DIAGNOSIS — F1910 Other psychoactive substance abuse, uncomplicated: Secondary | ICD-10-CM | POA: Insufficient documentation

## 2023-09-17 LAB — COMPREHENSIVE METABOLIC PANEL
ALT: 25 U/L (ref 0–44)
AST: 29 U/L (ref 15–41)
Albumin: 4.4 g/dL (ref 3.5–5.0)
Alkaline Phosphatase: 58 U/L (ref 38–126)
Anion gap: 11 (ref 5–15)
BUN: 16 mg/dL (ref 6–20)
CO2: 22 mmol/L (ref 22–32)
Calcium: 8.8 mg/dL — ABNORMAL LOW (ref 8.9–10.3)
Chloride: 101 mmol/L (ref 98–111)
Creatinine, Ser: 0.71 mg/dL (ref 0.61–1.24)
GFR, Estimated: >60 mL/min (ref >60)
Glucose, Bld: 93 mg/dL (ref 70–99)
Potassium: 3.6 mmol/L (ref 3.5–5.1)
Sodium: 134 mmol/L — ABNORMAL LOW (ref 135–145)
Total Bilirubin: 1 mg/dL (ref <1.2)
Total Protein: 7.3 g/dL (ref 6.5–8.1)

## 2023-09-17 LAB — CBC
HCT: 40.8 % (ref 39.0–52.0)
Hemoglobin: 13.9 g/dL (ref 13.0–17.0)
MCH: 30.5 pg (ref 26.0–34.0)
MCHC: 34.1 g/dL (ref 30.0–36.0)
MCV: 89.7 fL (ref 80.0–100.0)
Platelets: 264 10*3/uL (ref 150–400)
RBC: 4.55 MIL/uL (ref 4.22–5.81)
RDW: 13.6 % (ref 11.5–15.5)
WBC: 7.7 10*3/uL (ref 4.0–10.5)
nRBC: 0 % (ref 0.0–0.2)

## 2023-09-17 LAB — SALICYLATE LEVEL: Salicylate Lvl: <7 mg/dL — ABNORMAL LOW (ref 7.0–30.0)

## 2023-09-17 LAB — ETHANOL: Alcohol, Ethyl (B): <10 mg/dL (ref <10)

## 2023-09-17 LAB — ACETAMINOPHEN LEVEL: Acetaminophen (Tylenol), Serum: <10 ug/mL — ABNORMAL LOW (ref 10–30)

## 2023-09-17 MED ORDER — HYDROXYZINE HCL 25 MG PO TABS: 50.0000 mg | ORAL_TABLET | Freq: Four times a day (QID) | ORAL | Status: DC | PRN

## 2023-09-17 MED ORDER — ACETAMINOPHEN 325 MG PO TABS: 650.0000 mg | ORAL_TABLET | ORAL | Status: DC | PRN

## 2023-09-17 MED ORDER — DIPHENHYDRAMINE HCL 50 MG/ML IJ SOLN
50.0000 mg | Freq: Once | INTRAMUSCULAR | Status: AC
  Administered 2023-09-17: 50 mg via INTRAMUSCULAR
  Filled 2023-09-17: qty 1

## 2023-09-17 MED ORDER — STERILE WATER FOR INJECTION IJ SOLN
INTRAMUSCULAR | Status: AC
  Administered 2023-09-17: 1.2 mL
  Filled 2023-09-17: qty 10

## 2023-09-17 MED ORDER — GABAPENTIN 100 MG PO CAPS
100.0000 mg | ORAL_CAPSULE | Freq: Two times a day (BID) | ORAL | Status: DC
  Administered 2023-09-17 – 2023-09-19 (×4): 100 mg via ORAL
  Filled 2023-09-17 (×5): qty 1

## 2023-09-17 MED ORDER — ALUM & MAG HYDROXIDE-SIMETH 200-200-20 MG/5ML PO SUSP: 30.0000 mL | Freq: Four times a day (QID) | ORAL | Status: DC | PRN

## 2023-09-17 MED ORDER — ONDANSETRON HCL 4 MG PO TABS: 4.0000 mg | ORAL_TABLET | Freq: Three times a day (TID) | ORAL | Status: DC | PRN

## 2023-09-17 MED ORDER — ESCITALOPRAM OXALATE 10 MG PO TABS
20.0000 mg | ORAL_TABLET | Freq: Every day | ORAL | Status: DC
  Administered 2023-09-18 – 2023-09-19 (×2): 20 mg via ORAL
  Filled 2023-09-17 (×3): qty 2

## 2023-09-17 MED ORDER — BUSPIRONE HCL 5 MG PO TABS
7.5000 mg | ORAL_TABLET | Freq: Two times a day (BID) | ORAL | Status: DC
  Administered 2023-09-17 – 2023-09-19 (×4): 7.5 mg via ORAL
  Filled 2023-09-17 (×4): qty 1.5

## 2023-09-17 MED ORDER — LORAZEPAM 2 MG/ML IJ SOLN
2.0000 mg | Freq: Once | INTRAMUSCULAR | Status: AC
  Administered 2023-09-17: 2 mg via INTRAMUSCULAR
  Filled 2023-09-17: qty 1

## 2023-09-17 MED ORDER — QUETIAPINE FUMARATE 100 MG PO TABS
200.0000 mg | ORAL_TABLET | Freq: Every day | ORAL | Status: DC
  Administered 2023-09-17 – 2023-09-18 (×2): 200 mg via ORAL
  Filled 2023-09-17 (×2): qty 2

## 2023-09-17 MED ORDER — NICOTINE 21 MG/24HR TD PT24
21.0000 mg | MEDICATED_PATCH | Freq: Every day | TRANSDERMAL | Status: DC
  Administered 2023-09-18 – 2023-09-19 (×2): 21 mg via TRANSDERMAL
  Filled 2023-09-17 (×2): qty 1

## 2023-09-17 MED ORDER — ZIPRASIDONE MESYLATE 20 MG IM SOLR
20.0000 mg | Freq: Once | INTRAMUSCULAR | Status: AC
  Administered 2023-09-17: 20 mg via INTRAMUSCULAR
  Filled 2023-09-17: qty 20
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
## 2023-09-17 NOTE — ED Notes (Signed)
Patient sleeping , breaths equal and unlabored 

## 2023-09-17 NOTE — BH Assessment (Signed)
Clinician secure messaged Trevor [name], paramedic.  Pt is still sleeping.  He was sedated at 14:30.  If he is too sleepy at 22:30 medication admin, TTS will try to see him later.

## 2023-09-17 NOTE — ED Notes (Signed)
1 belonging bag placed in locker 38

## 2023-09-17 NOTE — ED Triage Notes (Signed)
Pt presents with SI with a plan to overdose on "fentanyl" Pt states he can not handle the voices in his head any more. Pt reports command hallucinations telling him to "go get dope." Pt states symptoms are worse since his most recent hospitalization.

## 2023-09-17 NOTE — Consult Note (Signed)
Attempt to evaluate patient failed as he is sedated.  He received Ativan and Geodon 20 mg.  Will attempt later to evaluate and obtain UDS sample.

## 2023-09-18 DIAGNOSIS — F15259 Other stimulant dependence with stimulant-induced psychotic disorder, unspecified: Secondary | ICD-10-CM

## 2023-09-18 LAB — RAPID URINE DRUG SCREEN, HOSP PERFORMED
Amphetamines: POSITIVE — AB
Barbiturates: NOT DETECTED
Benzodiazepines: POSITIVE — AB
Cocaine: NOT DETECTED
Opiates: NOT DETECTED
Tetrahydrocannabinol: POSITIVE — AB

## 2023-09-18 NOTE — ED Notes (Signed)
Pt calm and cooperative at this time, resting quietly
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
## 2023-09-18 NOTE — ED Provider Notes (Signed)
Emergency Medicine Observation Re-evaluation Note  Trevor [name] is a 42 y.o. male, seen on rounds today.  Pt initially presented to the ED for complaints of Suicidal Currently, the patient is resting in the exam room asleep.  Physical Exam  BP 130/75 (BP Location: Right Arm)   Pulse 70   Temp 98 F (36.7 C) (Oral)   Resp 14   Ht 5\' 11"  (1.803 m)   Wt 72.6 kg   SpO2 100%   BMI 22.32 kg/m  Physical Exam General: No acute agitation, resting comfortably Cardiac: Not tachycardic on last vitals Lungs: Symmetric rise and fall of chest wall resting Psych: No acute agitation at this time  ED Course / MDM  EKG:   I have reviewed the labs performed to date as well as medications administered while in observation.  Recent changes in the last 24 hours include none reported by nursing.  Plan  Current plan is for awaiting inpatient psychiatric placement.    Trevor [name], Canary Brim, MD 09/18/23 [phone]

## 2023-09-18 NOTE — ED Notes (Signed)
Called and left a message for [facility] transport.

## 2023-09-18 NOTE — ED Notes (Signed)
IVC papers faxed to [facility].  

## 2023-09-19 ENCOUNTER — Encounter (HOSPITAL_COMMUNITY): Payer: Self-pay | Admitting: Nurse Practitioner

## 2023-09-19 ENCOUNTER — Other Ambulatory Visit: Payer: Self-pay

## 2023-09-19 ENCOUNTER — Inpatient Hospital Stay (HOSPITAL_COMMUNITY)
Admission: AD | Admit: 2023-09-19 | Discharge: 2023-09-27 | DRG: 885 | Disposition: A | Discharge status: Home / Self Care | Payer: BLUE CROSS/BLUE SHIELD | Source: Intra-hospital | Attending: Psychiatry | Admitting: Psychiatry

## 2023-09-19 DIAGNOSIS — Z881 Allergy status to other antibiotic agents status: Secondary | ICD-10-CM

## 2023-09-19 DIAGNOSIS — F3130 Bipolar disorder, current episode depressed, mild or moderate severity, unspecified: Principal | ICD-10-CM | POA: Diagnosis present

## 2023-09-19 DIAGNOSIS — F1220 Cannabis dependence, uncomplicated: Secondary | ICD-10-CM | POA: Diagnosis present

## 2023-09-19 DIAGNOSIS — F19951 Other psychoactive substance use, unspecified with psychoactive substance-induced psychotic disorder with hallucinations: Secondary | ICD-10-CM

## 2023-09-19 DIAGNOSIS — E559 Vitamin D deficiency, unspecified: Secondary | ICD-10-CM | POA: Diagnosis present

## 2023-09-19 DIAGNOSIS — F312 Bipolar disorder, current episode manic severe with psychotic features: Secondary | ICD-10-CM

## 2023-09-19 DIAGNOSIS — Z91148 Patient's other noncompliance with medication regimen for other reason: Secondary | ICD-10-CM

## 2023-09-19 DIAGNOSIS — F411 Generalized anxiety disorder: Secondary | ICD-10-CM | POA: Diagnosis present

## 2023-09-19 DIAGNOSIS — R45850 Homicidal ideations: Secondary | ICD-10-CM | POA: Diagnosis present

## 2023-09-19 DIAGNOSIS — F15229 Other stimulant dependence with intoxication, unspecified: Secondary | ICD-10-CM | POA: Diagnosis present

## 2023-09-19 DIAGNOSIS — Z560 Unemployment, unspecified: Secondary | ICD-10-CM

## 2023-09-19 DIAGNOSIS — Z5900 Homelessness unspecified: Secondary | ICD-10-CM

## 2023-09-19 DIAGNOSIS — F17210 Nicotine dependence, cigarettes, uncomplicated: Secondary | ICD-10-CM | POA: Diagnosis present

## 2023-09-19 DIAGNOSIS — E871 Hypo-osmolality and hyponatremia: Secondary | ICD-10-CM | POA: Diagnosis present

## 2023-09-19 DIAGNOSIS — R45851 Suicidal ideations: Secondary | ICD-10-CM | POA: Diagnosis present

## 2023-09-19 DIAGNOSIS — Z79899 Other long term (current) drug therapy: Secondary | ICD-10-CM

## 2023-09-19 DIAGNOSIS — E785 Hyperlipidemia, unspecified: Secondary | ICD-10-CM | POA: Diagnosis present

## 2023-09-19 DIAGNOSIS — Z91199 Patient's noncompliance with other medical treatment and regimen due to unspecified reason: Secondary | ICD-10-CM

## 2023-09-19 DIAGNOSIS — K029 Dental caries, unspecified: Secondary | ICD-10-CM | POA: Diagnosis present

## 2023-09-19 DIAGNOSIS — Z83438 Family history of other disorder of lipoprotein metabolism and other lipidemia: Secondary | ICD-10-CM

## 2023-09-19 DIAGNOSIS — Z981 Arthrodesis status: Secondary | ICD-10-CM

## 2023-09-19 DIAGNOSIS — Z880 Allergy status to penicillin: Secondary | ICD-10-CM

## 2023-09-19 DIAGNOSIS — F319 Bipolar disorder, unspecified: Secondary | ICD-10-CM | POA: Diagnosis present

## 2023-09-19 DIAGNOSIS — Z5941 Food insecurity: Secondary | ICD-10-CM

## 2023-09-19 DIAGNOSIS — G8929 Other chronic pain: Secondary | ICD-10-CM | POA: Diagnosis present

## 2023-09-19 DIAGNOSIS — Z5982 Transportation insecurity: Secondary | ICD-10-CM

## 2023-09-19 DIAGNOSIS — F3164 Bipolar disorder, current episode mixed, severe, with psychotic features: Secondary | ICD-10-CM | POA: Diagnosis not present

## 2023-09-19 LAB — VITAMIN D 25 HYDROXY (VIT D DEFICIENCY, FRACTURES): Vit D, 25-Hydroxy: 23.45 ng/mL — ABNORMAL LOW (ref 30–100)

## 2023-09-19 LAB — TSH: TSH: 2.02 u[IU]/mL (ref 0.350–4.500)

## 2023-09-19 MED ORDER — ONDANSETRON HCL 4 MG PO TABS: 4.0000 mg | ORAL_TABLET | Freq: Three times a day (TID) | ORAL | Status: DC | PRN

## 2023-09-19 MED ORDER — LORAZEPAM 2 MG/ML IJ SOLN
2.0000 mg | Freq: Three times a day (TID) | INTRAMUSCULAR | Status: DC | PRN
  Administered 2023-09-23: 2 mg via INTRAMUSCULAR

## 2023-09-19 MED ORDER — HYDROXYZINE HCL 50 MG PO TABS
50.0000 mg | ORAL_TABLET | Freq: Three times a day (TID) | ORAL | Status: DC | PRN
  Administered 2023-09-19 – 2023-09-26 (×15): 50 mg via ORAL
  Filled 2023-09-19 (×16): qty 1

## 2023-09-19 MED ORDER — LORAZEPAM 2 MG/ML IJ SOLN
2.0000 mg | Freq: Three times a day (TID) | INTRAMUSCULAR | Status: DC | PRN
  Filled 2023-09-19: qty 1

## 2023-09-19 MED ORDER — QUETIAPINE FUMARATE 300 MG PO TABS
300.0000 mg | ORAL_TABLET | Freq: Every day | ORAL | Status: DC
  Administered 2023-09-19: 300 mg via ORAL
  Filled 2023-09-19 (×3): qty 1

## 2023-09-19 MED ORDER — DIPHENHYDRAMINE HCL 50 MG/ML IJ SOLN
50.0000 mg | Freq: Three times a day (TID) | INTRAMUSCULAR | Status: DC | PRN
  Administered 2023-09-23: 50 mg via INTRAMUSCULAR

## 2023-09-19 MED ORDER — ACETAMINOPHEN 325 MG PO TABS: 650.0000 mg | ORAL_TABLET | Freq: Four times a day (QID) | ORAL | Status: DC | PRN

## 2023-09-19 MED ORDER — GABAPENTIN 100 MG PO CAPS
100.0000 mg | ORAL_CAPSULE | Freq: Two times a day (BID) | ORAL | Status: DC
  Administered 2023-09-19 – 2023-09-21 (×4): 100 mg via ORAL
  Filled 2023-09-19 (×8): qty 1

## 2023-09-19 MED ORDER — ALUM & MAG HYDROXIDE-SIMETH 200-200-20 MG/5ML PO SUSP: 30.0000 mL | Freq: Four times a day (QID) | ORAL | Status: DC | PRN

## 2023-09-19 MED ORDER — HALOPERIDOL 5 MG PO TABS
5.0000 mg | ORAL_TABLET | Freq: Three times a day (TID) | ORAL | Status: DC | PRN
  Administered 2023-09-21: 5 mg via ORAL
  Filled 2023-09-19 (×2): qty 1

## 2023-09-19 MED ORDER — ACETAMINOPHEN 325 MG PO TABS
650.0000 mg | ORAL_TABLET | ORAL | Status: DC | PRN
  Administered 2023-09-26: 650 mg via ORAL
  Filled 2023-09-19: qty 2

## 2023-09-19 MED ORDER — ESCITALOPRAM OXALATE 20 MG PO TABS
20.0000 mg | ORAL_TABLET | Freq: Every day | ORAL | Status: DC
  Filled 2023-09-19 (×3): qty 1

## 2023-09-19 MED ORDER — ESCITALOPRAM OXALATE 10 MG PO TABS
10.0000 mg | ORAL_TABLET | Freq: Every day | ORAL | Status: DC
  Administered 2023-09-19 – 2023-09-20 (×2): 10 mg via ORAL
  Filled 2023-09-19 (×4): qty 1

## 2023-09-19 MED ORDER — DIPHENHYDRAMINE HCL 50 MG/ML IJ SOLN
50.0000 mg | Freq: Three times a day (TID) | INTRAMUSCULAR | Status: DC | PRN
  Filled 2023-09-19: qty 1

## 2023-09-19 MED ORDER — HALOPERIDOL LACTATE 5 MG/ML IJ SOLN
10.0000 mg | Freq: Three times a day (TID) | INTRAMUSCULAR | Status: DC | PRN
  Administered 2023-09-23: 10 mg via INTRAMUSCULAR
  Filled 2023-09-19: qty 2

## 2023-09-19 MED ORDER — HYDROXYZINE HCL 50 MG PO TABS
50.0000 mg | ORAL_TABLET | Freq: Four times a day (QID) | ORAL | Status: DC | PRN
  Administered 2023-09-19: 50 mg via ORAL
  Filled 2023-09-19: qty 1

## 2023-09-19 MED ORDER — DIPHENHYDRAMINE HCL 25 MG PO CAPS
50.0000 mg | ORAL_CAPSULE | Freq: Three times a day (TID) | ORAL | Status: DC | PRN
  Administered 2023-09-21: 50 mg via ORAL
  Filled 2023-09-19 (×2): qty 2

## 2023-09-19 MED ORDER — NICOTINE 21 MG/24HR TD PT24
21.0000 mg | MEDICATED_PATCH | Freq: Every day | TRANSDERMAL | Status: DC
  Administered 2023-09-20 – 2023-09-27 (×8): 21 mg via TRANSDERMAL
  Filled 2023-09-19 (×6): qty 1
  Filled 2023-09-19: qty 14
  Filled 2023-09-19 (×4): qty 1

## 2023-09-19 MED ORDER — MAGNESIUM HYDROXIDE 400 MG/5ML PO SUSP: 30.0000 mL | Freq: Every day | ORAL | Status: DC | PRN

## 2023-09-19 MED ORDER — HALOPERIDOL LACTATE 5 MG/ML IJ SOLN: 5.0000 mg | Freq: Three times a day (TID) | INTRAMUSCULAR | Status: DC | PRN

## 2023-09-19 MED ORDER — ENSURE ENLIVE PO LIQD
237.0000 mL | Freq: Two times a day (BID) | ORAL | Status: DC
  Administered 2023-09-19 – 2023-09-27 (×15): 237 mL via ORAL
  Filled 2023-09-19 (×18): qty 237

## 2023-09-19 MED ORDER — BUSPIRONE HCL 7.5 MG PO TABS
7.5000 mg | ORAL_TABLET | Freq: Two times a day (BID) | ORAL | Status: DC
  Filled 2023-09-19 (×4): qty 1

## 2023-09-19 MED ORDER — QUETIAPINE FUMARATE 200 MG PO TABS
200.0000 mg | ORAL_TABLET | Freq: Every day | ORAL | Status: DC
  Filled 2023-09-19 (×2): qty 1

## 2023-09-19 NOTE — Progress Notes (Signed)
Admission Note: Patient is a 42 year old male admitted to the unit under IVC status for substance abuse, auditory hallucination and suicidal ideation with a plan to overdose on Fentanyl.  UDS positive for Meth, Benzos and THC.  Patient is alert and oriented x 4.  Presents with anxious mood and affect.  Stated his goal is to get his medication adjusted and right.  Admission plan of care reviewed, consent signed.  Skin and personal belongings completed.  Skin is dry and intact.  No contraband found.  Patient oriented to the unit, room and staff.  Verbalizes understanding of unit rules/protocols.  Patient is safe on the unit.

## 2023-09-19 NOTE — Plan of Care (Signed)
  Problem: Education: Goal: Knowledge of [facility] Education information/materials will improve Outcome: Progressing Goal: Verbalization of understanding the information provided will improve Outcome: Progressing   Problem: Safety: Goal: Periods of time without injury will increase Outcome: Progressing   

## 2023-09-19 NOTE — [facility] Group Notes (Signed)

## 2023-09-19 NOTE — Progress Notes (Signed)
Pt has been accepted to [facility] on 09/19/2023 Bed assignment: 402-2  Pt meets inpatient criteria per: Dahlia Byes NP  Attending Physician will be: Dr. Sarita Bottom, MD   Report can be called to:[phone] Adult unit: [id number]  Pt can arrive now   Care Team Notified: [facility] Rona Ravens RN, Dahlia Byes NP, Lum Babe RN, Jacquelynn Cree NT, Rona Ravens RN Stacey Toben RN      [place] Mikaelyn Arthurs LCSW-A   09/19/2023 10:34 AM

## 2023-09-19 NOTE — ED Notes (Signed)
 Patient off unit to [facility] per provider. Patient alert and no s/s of distress at this time. Patient discharge information and belongings given to [employer] for transport. Patient ambulatory off unit, escorted and transported by [employer].

## 2023-09-19 NOTE — Tx Team (Signed)
Initial Treatment Plan 09/19/2023 1:27 PM Trevor [name] [id number]    PATIENT STRESSORS: Financial difficulties   Health problems   Medication change or noncompliance   Substance abuse     PATIENT STRENGTHS: Ability for insight  Active sense of humor  Motivation for treatment/growth    PATIENT IDENTIFIED PROBLEMS: "Get meds right"  "Get myself right"  Substance Abuse  Ineffective coping skills               DISCHARGE CRITERIA:  Ability to meet basic life and health needs Motivation to continue treatment in a less acute level of care  PRELIMINARY DISCHARGE PLAN: Attend aftercare/continuing care group Outpatient therapy Return to previous living arrangement  PATIENT/FAMILY INVOLVEMENT: This treatment plan has been presented to and reviewed with the patient, Trevor [name], and/or family member.  The patient and family have been given the opportunity to ask questions and make suggestions.  Trevor Lia Jazzmyne Rasnick, RN 09/19/2023, 1:27 PM

## 2023-09-20 DIAGNOSIS — F3164 Bipolar disorder, current episode mixed, severe, with psychotic features: Secondary | ICD-10-CM

## 2023-09-20 DIAGNOSIS — F19951 Other psychoactive substance use, unspecified with psychoactive substance-induced psychotic disorder with hallucinations: Secondary | ICD-10-CM

## 2023-09-20 MED ORDER — QUETIAPINE FUMARATE 100 MG PO TABS
100.0000 mg | ORAL_TABLET | Freq: Every evening | ORAL | Status: DC | PRN
  Administered 2023-09-20 – 2023-09-25 (×5): 100 mg via ORAL
  Filled 2023-09-20 (×5): qty 1

## 2023-09-20 MED ORDER — RISPERIDONE 1 MG/ML PO SOLN
0.2500 mg | Freq: Once | ORAL | Status: AC
  Administered 2023-09-20: 0.25 mg via ORAL
  Filled 2023-09-20: qty 0.25

## 2023-09-20 MED ORDER — QUETIAPINE FUMARATE 100 MG PO TABS: 100.0000 mg | ORAL_TABLET | Freq: Every day | ORAL | Status: DC

## 2023-09-20 MED ORDER — RISPERIDONE 1 MG PO TABS
1.0000 mg | ORAL_TABLET | Freq: Every day | ORAL | Status: DC
Start: 2023-09-20 — End: 2023-09-26
  Administered 2023-09-20 – 2023-09-25 (×5): 1 mg via ORAL
  Filled 2023-09-20 (×10): qty 1

## 2023-09-20 MED ORDER — ESCITALOPRAM OXALATE 5 MG PO TABS
5.0000 mg | ORAL_TABLET | Freq: Every day | ORAL | Status: DC
Start: 2023-09-21 — End: 2023-09-27
  Administered 2023-09-21 – 2023-09-27 (×7): 5 mg via ORAL
  Filled 2023-09-20: qty 14
  Filled 2023-09-20 (×8): qty 1

## 2023-09-20 NOTE — Plan of Care (Signed)
  Problem: Coping: Goal: Ability to verbalize frustrations and anger appropriately will improve Outcome: Progressing   Problem: Safety: Goal: Periods of time without injury will increase Outcome: Progressing   Problem: Coping: Goal: Ability to verbalize frustrations and anger appropriately will improve Outcome: Progressing

## 2023-09-20 NOTE — Group Note (Signed)
 LCSW Group Therapy Note  Group Date: 09/20/2023 Start Time: 1100 End Time: 1200   Type of Therapy and Topic:  Group Therapy:  Stages of Change  Participation Level:  patient was present but left 10 minutes before the group ended  Description of Group: Discussed 5 stages of change, for example, when developing new habits and overcoming addictions:  pre-contemplation, contemplation, preparation, action and maintenance.  Discussed handout, why change is hard and completed a worksheet.  Therapeutic Goals: Patient will identify stage of change they are experiencing. Patient will recognize obstacles and triggers when making progress.      Summary of Patient Progress:   Patient was present and actively participated in the discussion: he was insightful.     Therapeutic Modalities:  Elements of Motivational Interviewing (MI)  Evanee Lubrano O Dyami Umbach, LCSWA 09/20/2023  2:55 PM

## 2023-09-20 NOTE — [facility] Group Notes (Signed)
 [facility] Group Notes:  (Nursing/MHT/Case Management/Adjunct)  Date:  09/20/2023  Time:  9:21 PM  Type of Therapy:   Wrap-up group  Participation Level:  Did Not Attend  Participation Quality:    Affect:    Cognitive:    Insight:    Engagement in Group:    Modes of Intervention:    Summary of Progress/Problems: Pt refused group. Writer provided positive thinking worksheet for Pt. to work on in his room.   Trevor [name] 09/20/2023, 9:21 PM

## 2023-09-20 NOTE — Progress Notes (Signed)
   09/19/23 2130  Psych Admission Type (Psych Patients Only)  Admission Status Involuntary  Psychosocial Assessment  Patient Complaints Anxiety;Depression  Eye Contact Fair  Facial Expression Anxious  Affect Preoccupied  Speech Logical/coherent  Interaction Assertive  Motor Activity Slow  Appearance/Hygiene Disheveled  Behavior Characteristics Cooperative  Mood Depressed;Anxious  Thought Process  Coherency Circumstantial  Content Preoccupation  Delusions Paranoid  Perception Hallucinations  Hallucination Auditory  Judgment Impaired  Confusion None  Danger to Self  Current suicidal ideation? Denies  Self-Injurious Behavior No self-injurious ideation or behavior indicators observed or expressed   Agreement Not to Harm Self Yes  Description of Agreement Verbal  Danger to Others  Danger to Others None reported or observed

## 2023-09-20 NOTE — Progress Notes (Signed)
   09/20/23 2226  Psych Admission Type (Psych Patients Only)  Admission Status Involuntary  Psychosocial Assessment  Patient Complaints Anxiety;Substance abuse  Eye Contact Fair  Facial Expression Anxious  Affect Appropriate to circumstance  Speech Logical/coherent  Interaction Assertive  Motor Activity Other (Comment) (WDL)  Appearance/Hygiene Unremarkable  Behavior Characteristics Appropriate to situation  Mood Anxious;Depressed  Thought Process  Coherency Circumstantial  Content Preoccupation  Delusions Paranoid  Perception Hallucinations  Hallucination Auditory  Judgment Impaired  Confusion None  Danger to Self  Current suicidal ideation? Denies  Self-Injurious Behavior No self-injurious ideation or behavior indicators observed or expressed   Agreement Not to Harm Self Yes  Description of Agreement verbal  Danger to Others  Danger to Others None reported or observed

## 2023-09-20 NOTE — Progress Notes (Signed)
   09/20/23 0800  Psych Admission Type (Psych Patients Only)  Admission Status Involuntary  Psychosocial Assessment  Patient Complaints Anxiety;Depression  Eye Contact Fair  Facial Expression Anxious  Affect Preoccupied  Speech Logical/coherent  Interaction Assertive  Motor Activity Slow  Appearance/Hygiene Improved  Behavior Characteristics Cooperative;Appropriate to situation  Mood Anxious;Depressed  Thought Process  Coherency Circumstantial  Content Preoccupation  Delusions Paranoid  Perception Hallucinations  Hallucination Auditory  Judgment Impaired  Confusion None  Danger to Self  Current suicidal ideation? Denies  Self-Injurious Behavior No self-injurious ideation or behavior indicators observed or expressed   Agreement Not to Harm Self Yes  Description of Agreement Verbal  Danger to Others  Danger to Others None reported or observed
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
## 2023-09-20 NOTE — Group Note (Signed)
 Recreation Therapy Group Note   Group Topic:Animal Assisted Therapy   Group Date: 09/20/2023 Start Time: 0946 End Time: 1030 Facilitators: Zian Mohamed-McCall, LRT,CTRS Location: [address] Dayroom   Animal-Assisted Activity (AAA) Program Checklist/Progress Notes Patient Eligibility Criteria Checklist & Daily Group note for Rec Tx Intervention  AAA/T Program Assumption of Risk Form signed by Patient/ or Parent Legal Guardian Yes  Patient understands his/her participation is voluntary Yes  Education: Charity Fundraiser, Appropriate Animal Interaction   Education Outcome: Acknowledges education.    Affect/Mood: N/A   Participation Level: Did not attend    Clinical Observations/Individualized Feedback:     Plan: Continue to engage patient in RT group sessions 2-3x/week.   Degan Hanser-McCall, LRT,CTRS  09/20/2023 12:35 PM
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
## 2023-09-21 ENCOUNTER — Encounter (HOSPITAL_COMMUNITY): Payer: Self-pay

## 2023-09-21 DIAGNOSIS — F3164 Bipolar disorder, current episode mixed, severe, with psychotic features: Secondary | ICD-10-CM | POA: Diagnosis not present

## 2023-09-21 LAB — HEMOGLOBIN A1C
Hgb A1c MFr Bld: 5.7 % — ABNORMAL HIGH (ref 4.8–5.6)
Mean Plasma Glucose: 117 mg/dL

## 2023-09-21 MED ORDER — GABAPENTIN 300 MG PO CAPS
300.0000 mg | ORAL_CAPSULE | Freq: Three times a day (TID) | ORAL | Status: DC
  Administered 2023-09-21 – 2023-09-27 (×17): 300 mg via ORAL
  Filled 2023-09-21: qty 42
  Filled 2023-09-21 (×13): qty 1
  Filled 2023-09-21: qty 42
  Filled 2023-09-21: qty 1
  Filled 2023-09-21: qty 42
  Filled 2023-09-21 (×6): qty 1

## 2023-09-21 MED ORDER — RISPERIDONE 0.25 MG PO TABS
0.2500 mg | ORAL_TABLET | Freq: Every day | ORAL | Status: DC
  Administered 2023-09-21 – 2023-09-26 (×6): 0.25 mg via ORAL
  Filled 2023-09-21 (×8): qty 1

## 2023-09-21 NOTE — Progress Notes (Signed)
   09/21/23 0530  15 Minute Checks  Location Bedroom  Visual Appearance Calm  Behavior Sleeping  Sleep (Behavioral Health Patients Only)  Calculate sleep? (Click Yes once per 24 hr at 0600 safety check) Yes  Documented sleep last 24 hours 8

## 2023-09-21 NOTE — Progress Notes (Signed)
 Patient rated his anxiety level 10/10 and his depression level 10/10 with 10 being the highest and 0 none. Pt noted to be angry and irritable with staff earlier on shift, cursing at staff and complaining about his medications. Vistaril  50 mg po administered x 2 on shift for complaints of anxiety with effective results. Medication compliant., pt compliant with some groups  x 2 on shift. Appetite good on shift. Safety maintained.   09/21/23 0839  Psych Admission Type (Psych Patients Only)  Admission Status Involuntary  Psychosocial Assessment  Patient Complaints Anxiety;Depression  Eye Contact Fair  Facial Expression Anxious  Affect Anxious;Irritable;Angry  Speech Argumentative  Interaction Arrogant  Motor Activity Other (Comment) (wnl)  Appearance/Hygiene Unremarkable  Behavior Characteristics Anxious;Irritable  Mood Depressed;Anxious;Angry;Irritable  Thought Process  Coherency Circumstantial  Content Blaming others;Preoccupation  Delusions Paranoid  Perception WDL  Hallucination None reported or observed  Judgment Impaired  Confusion None  Danger to Self  Current suicidal ideation? Denies  Self-Injurious Behavior No self-injurious ideation or behavior indicators observed or expressed   Agreement Not to Harm Self Yes  Description of Agreement Verbal  Danger to Others  Danger to Others None reported or observed

## 2023-09-21 NOTE — [facility] Group Notes (Signed)
 [facility] Group Notes:  (Nursing/MHT/Case Management/Adjunct)  Date:  09/21/2023  Time:  8:49 PM  Type of Therapy:   NA Group  Participation Level:  Did Not Attend    Trevor [name] 09/21/2023, 8:49 PM
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
## 2023-09-21 NOTE — Progress Notes (Addendum)
 CSW faxed a request for inpatient treatment program to [facility] [phone].  CSW was informed [phone] that patient didn't qualify because Medicaid doesn't cover treatment for stimulants.    Angelique Chevalier, LCSWA 09/21/2023 3:56 PM   CSW called [facility] [phone].  They don't accept Medicaid.  Arrayah Connors, LCSWA 09/21/2023 4:02 PM   Patient said that he doesn't want to go to [facility] because he was there before.  He said that he can go to [facility]; CSW called [facility] but they were closed due to the holiday (New Year).  Lashawna Poche, LCSWA 09/21/2023 5:01 PM

## 2023-09-21 NOTE — Group Note (Signed)
 Recreation Therapy Group Note   Group Topic:General Recreation  Group Date: 09/21/2023 Start Time: 0930 End Time: 1005 Facilitators: Kynlea Blackston-McCall, LRT,CTRS Location: [address] Dayroom   Group Topic/Focus: General Recreation   Goal Area(s) Addresses:  Patient will use appropriate interactions with peers.   Patient will choose appropriate songs.  Intervention: Research scientist (medical), Television  Group Description: Karaoke. Patients took turns selecting songs that were appropriate to perform for their enjoyment with peers.      Affect/Mood: Flat   Participation Level: None   Participation Quality: None   Behavior: Withdrawn   Speech/Thought Process: None   Insight: None   Judgement: None   Modes of Intervention: Music   Patient Response to Interventions:  Resistant    Education Outcome:  In group clarification offered    Clinical Observations/Individualized Feedback: Pt was flat and withdrawn. Pt stated for a few minutes before leaving and not returning.     Plan: Continue to engage patient in RT group sessions 2-3x/week.   Trevor [name], LRT,CTRS 09/21/2023 1:00 PM

## 2023-09-21 NOTE — Plan of Care (Signed)
  Problem: Activity: Goal: Sleeping patterns will improve Outcome: Progressing   Problem: Safety: Goal: Periods of time without injury will increase Outcome: Progressing   Problem: Physical Regulation: Goal: Ability to maintain clinical measurements within normal limits will improve Outcome: Progressing

## 2023-09-21 NOTE — Plan of Care (Signed)
  Problem: Education: Goal: Knowledge of [facility] Education information/materials will improve Outcome: Progressing   Problem: Education: Goal: Emotional status will improve Outcome: Progressing   

## 2023-09-21 NOTE — BH IP Treatment Plan (Signed)
 Interdisciplinary Treatment and Diagnostic Plan Update  09/21/2023 Time of Session: 11:35 AM Trevor [name] MRN: [id number]  Principal Diagnosis: Bipolar disorder (manic depression) (HCC)  Secondary Diagnoses: Principal Problem:   Bipolar disorder (manic depression) (HCC) Active Problems:   Psychoactive substance-induced organic hallucinosis (HCC)   Current Medications:  Current Facility-Administered Medications  Medication Dose Route Frequency Provider Last Rate Last Admin   acetaminophen  (TYLENOL ) tablet 650 mg  650 mg Oral Q4H PRN Alan, [name], NP       alum & mag hydroxide-simeth (MAALOX/MYLANTA) 200-200-20 MG/5ML suspension 30 mL  30 mL Oral Q6H PRN [name], [name], NP       haloperidol  (HALDOL ) tablet 5 mg  5 mg Oral TID PRN [name], [name], NP       And   diphenhydrAMINE  (BENADRYL ) capsule 50 mg  50 mg Oral TID PRN [name], [name], NP       haloperidol  lactate (HALDOL ) injection 5 mg  5 mg Intramuscular TID PRN [name], [name], NP       And   diphenhydrAMINE  (BENADRYL ) injection 50 mg  50 mg Intramuscular TID PRN [name], [name], NP       And   LORazepam  (ATIVAN ) injection 2 mg  2 mg Intramuscular TID PRN [name], [name], NP       haloperidol  lactate (HALDOL ) injection 10 mg  10 mg Intramuscular TID PRN [name], [name], NP       And   diphenhydrAMINE  (BENADRYL ) injection 50 mg  50 mg Intramuscular TID PRN [name], [name], NP       And   LORazepam  (ATIVAN ) injection 2 mg  2 mg Intramuscular TID PRN [name], [name], NP       escitalopram  (LEXAPRO ) tablet 5 mg  5 mg Oral Daily [name], [name], MD   5 mg at 09/21/23 9180   feeding supplement (ENSURE ENLIVE / ENSURE PLUS) liquid 237 mL  237 mL Oral BID BM [name], [name], MD   237 mL at 09/21/23 1330   gabapentin  (NEURONTIN ) capsule 300 mg  300 mg Oral TID [name], [name], MD       hydrOXYzine  (ATARAX ) tablet 50 mg  50 mg Oral TID PRN Ntuen, [name], [name]   50 mg at 09/21/23 9160    magnesium  hydroxide (MILK OF MAGNESIA) suspension 30 mL  30 mL Oral Daily PRN [name], [name], NP       nicotine  (NICODERM CQ  - dosed in mg/24 hours) patch 21 mg  21 mg Transdermal Daily [name], [name], NP   21 mg at 09/21/23 9180   ondansetron  (ZOFRAN ) tablet 4 mg  4 mg Oral Q8H PRN [name], [name], NP       QUEtiapine  (SEROQUEL ) tablet 100 mg  100 mg Oral QHS PRN [name], [name], MD   100 mg at 09/20/23 2115   risperiDONE  (RISPERDAL ) tablet 0.25 mg  0.25 mg Oral Daily [name], [name], MD   0.25 mg at 09/21/23 1330   risperiDONE  (RISPERDAL ) tablet 1 mg  1 mg Oral QHS [name], [name], MD   1 mg at 09/20/23 2115   PTA Medications: Medications Prior to Admission  Medication Sig Dispense Refill Last Dose/Taking   acetaminophen  (TYLENOL ) 325 MG tablet Take 2 tablets (650 mg total) by mouth every 6 (six) hours as needed for mild pain (pain score 1-3), moderate pain (pain score 4-6) or fever. (Patient not taking: Reported on 09/18/2023)      busPIRone  (BUSPAR ) 7.5  MG tablet Take 1 tablet (7.5 mg total) by mouth 2 (two) times daily. 60 tablet 0    escitalopram  (LEXAPRO ) 20 MG tablet Take 1 tablet (20 mg total) by mouth daily. 30 tablet 0    gabapentin  (NEURONTIN ) 100 MG capsule Take 1 capsule (100 mg total) by mouth 2 (two) times daily. 60 capsule 0    hydrOXYzine  (ATARAX ) 50 MG tablet Take 1 tablet (50 mg total) by mouth every 6 (six) hours as needed for anxiety. (Patient not taking: Reported on 09/18/2023) 15 tablet 0    QUEtiapine  (SEROQUEL ) 200 MG tablet Take 1 tablet (200 mg total) by mouth at bedtime. 30 tablet 0     Patient Stressors: Financial difficulties   Health problems   Medication change or noncompliance   Substance abuse    Patient Strengths: Ability for insight  Active sense of humor  Motivation for treatment/growth   Treatment Modalities: Medication Management, Group therapy, Case management,  1 to 1 session with clinician, Psychoeducation,  Recreational therapy.   Physician Treatment Plan for Primary Diagnosis: Bipolar disorder (manic depression) (HCC) Long Term Goal(s): Improvement in symptoms so as ready for discharge   Short Term Goals: Ability to identify changes in lifestyle to reduce recurrence of condition will improve Ability to verbalize feelings will improve Ability to disclose and discuss suicidal ideas Ability to demonstrate self-control will improve Ability to identify and develop effective coping behaviors will improve Ability to maintain clinical measurements within normal limits will improve Compliance with prescribed medications will improve Ability to identify triggers associated with substance abuse/mental health issues will improve  Medication Management: Evaluate patient's response, side effects, and tolerance of medication regimen.  Therapeutic Interventions: 1 to 1 sessions, Unit Group sessions and Medication administration.  Evaluation of Outcomes: Not Progressing  Physician Treatment Plan for Secondary Diagnosis: Principal Problem:   Bipolar disorder (manic depression) (HCC) Active Problems:   Psychoactive substance-induced organic hallucinosis (HCC)  Long Term Goal(s): Improvement in symptoms so as ready for discharge   Short Term Goals: Ability to identify changes in lifestyle to reduce recurrence of condition will improve Ability to verbalize feelings will improve Ability to disclose and discuss suicidal ideas Ability to demonstrate self-control will improve Ability to identify and develop effective coping behaviors will improve Ability to maintain clinical measurements within normal limits will improve Compliance with prescribed medications will improve Ability to identify triggers associated with substance abuse/mental health issues will improve     Medication Management: Evaluate patient's response, side effects, and tolerance of medication regimen.  Therapeutic Interventions: 1 to 1  sessions, Unit Group sessions and Medication administration.  Evaluation of Outcomes: Not Progressing   RN Treatment Plan for Primary Diagnosis: Bipolar disorder (manic depression) (HCC) Long Term Goal(s): Knowledge of disease and therapeutic regimen to maintain health will improve  Short Term Goals: Ability to remain free from injury will improve, Ability to verbalize frustration and anger appropriately will improve, Ability to demonstrate self-control, Ability to participate in decision making will improve, Ability to verbalize feelings will improve, Ability to disclose and discuss suicidal ideas, Ability to identify and develop effective coping behaviors will improve, and Compliance with prescribed medications will improve  Medication Management: RN will administer medications as ordered by provider, will assess and evaluate patient's response and provide education to patient for prescribed medication. RN will report any adverse and/or side effects to prescribing provider.  Therapeutic Interventions: 1 on 1 counseling sessions, Psychoeducation, Medication administration, Evaluate responses to treatment, Monitor vital signs and CBGs as  ordered, Perform/monitor CIWA, COWS, AIMS and Fall Risk screenings as ordered, Perform wound care treatments as ordered.  Evaluation of Outcomes: Not Progressing   LCSW Treatment Plan for Primary Diagnosis: Bipolar disorder (manic depression) (HCC) Long Term Goal(s): Safe transition to appropriate next level of care at discharge, Engage patient in therapeutic group addressing interpersonal concerns.  Short Term Goals: Engage patient in aftercare planning with referrals and resources, Increase social support, Increase ability to appropriately verbalize feelings, Increase emotional regulation, Facilitate acceptance of mental health diagnosis and concerns, Facilitate patient progression through stages of change regarding substance use diagnoses and concerns,  Identify triggers associated with mental health/substance abuse issues, and Increase skills for wellness and recovery  Therapeutic Interventions: Assess for all discharge needs, 1 to 1 time with Social worker, Explore available resources and support systems, Assess for adequacy in community support network, Educate family and significant other(s) on suicide prevention, Complete Psychosocial Assessment, Interpersonal group therapy.  Evaluation of Outcomes: Not Progressing   Progress in Treatment: Attending groups: patients attends some groups Participating in groups: Yes. Taking medication as prescribed: Yes. Toleration medication: Yes. Family/Significant other contact made: patient doesn't have close family:  his mother passed away, and he doesn't get along with his brother.  CSW called [employer], [phone] Patient understands diagnosis: Yes. Discussing patient identified problems/goals with staff: Yes. Medical problems stabilized or resolved: Yes. Denies suicidal/homicidal ideation: Yes. Issues/concerns per patient self-inventory: No.  New problem(s) identified: No  New Short Term/Long Term Goal(s):   medication stabilization, elimination of SI thoughts, development of comprehensive mental wellness plan.    Patient Goals:  I'm not doing good.  I want meds to start working.  I want to kill myself every day but I'm not saying I would act on it.  Patient said that he wants to go to a long-term dual diagnosis treatment program.  Discharge Plan or Barriers:  Patient recently admitted. CSW will continue to follow and assess for appropriate referrals and possible discharge planning.    Reason for Continuation of Hospitalization: Hallucinations Medication stabilization Suicidal ideation  Estimated Length of Stay:  5 - 7 days  Last 3 [place] Suicide Severity Risk Score: Flowsheet Row [facility]17/2023 in [facility] Admission (Discharged) from 08/31/2023 in [facility]  C-SSRS RISK CATEGORY High Risk High Risk Low Risk       Last PHQ 2/9 Scores:     No data to display          Scribe for Treatment Team: Shenia Alan O Alexavier Tsutsui, LCSWA 09/21/2023 3:28 PM

## 2023-09-22 DIAGNOSIS — F312 Bipolar disorder, current episode manic severe with psychotic features: Secondary | ICD-10-CM | POA: Diagnosis not present

## 2023-09-22 DIAGNOSIS — F3164 Bipolar disorder, current episode mixed, severe, with psychotic features: Secondary | ICD-10-CM | POA: Diagnosis not present

## 2023-09-22 NOTE — Progress Notes (Signed)
   09/22/23 0554  15 Minute Checks  Location Bedroom  Visual Appearance Calm  Behavior Sleeping  Sleep (Behavioral Health Patients Only)  Calculate sleep? (Click Yes once per 24 hr at 0600 safety check) Yes  Documented sleep last 24 hours 8.75

## 2023-09-22 NOTE — Plan of Care (Signed)
  Problem: Activity: Goal: Sleeping patterns will improve Outcome: Progressing   Problem: Safety: Goal: Periods of time without injury will increase Outcome: Progressing   Problem: Medication: Goal: Compliance with prescribed medication regimen will improve Outcome: Progressing   Problem: Education: Goal: Mental status will improve Outcome: Progressing

## 2023-09-22 NOTE — Progress Notes (Signed)
 Informed by MHT that pt was angry about MHT rounding on pt for safety checks and requesting staff to not enter his bedroom anymore. Went to speak with pt and explained to pt that it is necessary to check on pt every 15 minutes to ensure pt safety. Pt was argumentative when reinforcing that we need to check on him every 15 minutes. Pt verbalized that we should just leave him alone because he doesn't need to be checked on and is paranoid. Pt appeared irritable and becoming increasingly agitated. Pt was offered and given PO agitation protocol.

## 2023-09-22 NOTE — [facility] Group Notes (Signed)
 [facility] Group Notes:  (Nursing/MHT/Case Management/Adjunct)  Date:  09/22/2023  Time:  9:27 AM  Type of Therapy:  Group Topic/ Focus: Goals Group: The focus of this group is to help patients establish daily goals to achieve during treatment and discuss how the patient can incorporate goal setting into their daily lives to aide in recovery.   Participation Level:  Did Not Attend  Summary of Progress/Problems:  Patient did not attend goals/ orientation group today. Patient was encouraged but refused.   Danette JONELLE Boos 09/22/2023, 9:27 AM

## 2023-09-22 NOTE — Plan of Care (Signed)
   Problem: Safety: Goal: Periods of time without injury will increase Outcome: Progressing

## 2023-09-22 NOTE — Group Note (Signed)
 LCSW Group Therapy Note   Group Date: 09/22/2023 Start Time: 1100 End Time: 1200  Type of Therapy and Topic:  Group Therapy - Who Am I?  Participation Level:  DID NOT ATTEND  Description of Group The focus of this group was to aid patients in self-exploration and awareness. Patients were guided in exploring various factors of oneself to include interests, readiness to change, management of emotions, and individual perception of self. Patients were provided with complementary worksheets exploring hidden talents, ease of asking other for help, music/media preferences, understanding and responding to feelings/emotions, and hope for the future. At group closing, patients were encouraged to adhere to discharge plan to assist in continued self-exploration and understanding.  Therapeutic Goals Patients learned that self-exploration and awareness is an ongoing process Patients identified their individual skills, preferences, and abilities Patients explored their openness to establish and confide in supports Patients explored their readiness for change and progression of mental health     Therapeutic Modalities Cognitive Behavioral Therapy  Benjaman Donia JONELLE ISRAEL 09/23/2023  7:28 PM
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
## 2023-09-22 NOTE — Group Note (Signed)
 Date:  09/23/2023 Time:  4:21 AM  Group Topic/Focus:  Wrap-Up Group:   The focus of this group is to help patients review their daily goal of treatment and discuss progress on daily workbooks.    Participation Level:  Did Not Attend  Participation Quality:   N/A  Affect:   N/A  Cognitive:   N/A  Insight: None  Engagement in Group:   N/A  Modes of Intervention:   N/A  Additional Comments:  Patient did not attend wrap up group.   Trevor [name] 09/23/2023, 4:21 AM

## 2023-09-22 NOTE — Progress Notes (Signed)
   09/21/23 2039  Psych Admission Type (Psych Patients Only)  Admission Status Involuntary  Psychosocial Assessment  Patient Complaints Agitation;Anxiety;Depression  Eye Contact Fair  Facial Expression Angry  Affect Angry;Irritable  Speech Argumentative  Interaction Assertive  Motor Activity Other (Comment) (WNL)  Appearance/Hygiene Unremarkable  Behavior Characteristics Agitated  Mood Irritable;Preoccupied  Thought Process  Coherency Circumstantial  Content Blaming others;Preoccupation  Delusions Paranoid  Perception Hallucinations  Hallucination Auditory  Judgment Poor  Confusion None  Danger to Self  Current suicidal ideation? Denies  Self-Injurious Behavior No self-injurious ideation or behavior indicators observed or expressed   Agreement Not to Harm Self Yes  Description of Agreement verbal  Danger to Others  Danger to Others None reported or observed

## 2023-09-22 NOTE — Progress Notes (Signed)
 Patient would like to go to [facility]. CSW faxed a referral, and Rosaline from [facility] accepted the patient to the program.   CSW spoke with Dr. Hinda, who advised that patient's discharge date will be Monday, 09/26/2023.  Mareena Cavan, LCSW-A 09/22/2023, 12:00 PM   Patient was asleep, so CSW left the brochure from [facility] and information in his room.   Maryon Kemnitz, LCSW-A 09/22/2023, 1 PM   The discharge date was confirmed with Rosaline from [facility].  Rosaline advised that patient has to arrive by 9 AM on Monday, 09/26/23.  Patient's upcoming court date is on 11/03/2023. Rosaline said that if patient stays longer than 30 days in the program, they will provide documentation.  She advised that patient needs to contact the courthouse and inquire about a possible continuance, if needed.  Patient is looking forward to going there and thanked CSW for her help. CSW relayed information about the court hearing to the patient.  Dorothymae Maciver, LCSW-A 09/22/2023, 2 PM

## 2023-09-22 NOTE — Progress Notes (Signed)
 Patient rated his depression level 8/10 and his anxiety level 10/10 with 10 being then highest and 0 none. Medication compliant, pt refused to attend group therapy. Pt observed to stay in bed most of shift. Out of room for meals. Minimal interaction observed with peers. Safety maintained.    09/22/23 0845  Psych Admission Type (Psych Patients Only)  Admission Status Involuntary  Psychosocial Assessment  Patient Complaints Anxiety;Depression  Eye Contact Fair  Facial Expression Flat  Affect Irritable;Anxious  Speech Logical/coherent  Interaction Assertive  Motor Activity Other (Comment) (wnl)  Appearance/Hygiene Unremarkable  Behavior Characteristics Anxious;Irritable  Mood Anxious;Irritable;Preoccupied  Thought Process  Coherency Circumstantial  Content Blaming others;Preoccupation  Delusions None reported or observed  Perception WDL  Hallucination None reported or observed  Judgment Impaired  Confusion None  Danger to Self  Current suicidal ideation? Denies  Self-Injurious Behavior No self-injurious ideation or behavior indicators observed or expressed   Agreement Not to Harm Self Yes  Description of Agreement Verbal  Danger to Others  Danger to Others None reported or observed

## 2023-09-23 NOTE — Group Note (Signed)
 Date:  09/23/2023 Time:  10:54 AM  Group Topic/Focus:  Goals Group:   The focus of this group is to help patients establish daily goals to achieve during treatment and discuss how the patient can incorporate goal setting into their daily lives to aide in recovery. Orientation:   The focus of this group is to educate the patient on the purpose and policies of crisis stabilization and provide a format to answer questions about their admission.  The group details unit policies and expectations of patients while admitted.    Participation Level:  Active  Participation Quality:  Attentive and Sharing  Affect:  Appropriate  Cognitive:  Appropriate  Insight: Appropriate  Engagement in Group:  Engaged  Modes of Intervention:  Discussion, Orientation, and Rapport Building  Additional Comments:   Pt attended the Orientation/Goals group. Pt was attentive and actively engaged in the group. Pt shared concerns with treatment by the staff. Pt receptive to emotional support and encouragement. Pt's goal for today is to attend groups. Pt plans to get up out the bed when group is announced and go to help him reach his goal.   Addison CHRISTELLA Mac 09/23/2023, 10:54 AM

## 2023-09-23 NOTE — Group Note (Signed)
 Recreation Therapy Group Note   Group Topic:Team Building  Group Date: 09/23/2023 Start Time: 0930 End Time: 9046 Facilitators: Jennafer Gladue-McCall, LRT,CTRS Location: [address] Dayroom   Group Topic: Communication, Team Building, Problem Solving  Goal Area(s) Addresses:  Patient will effectively work with peer towards shared goal.  Patient will identify skills used to make activity successful.  Patient will identify how skills used during activity can be used to reach post d/c goals.   Intervention: STEM Activity  Group Description: Straw Bridge. In teams of 3-5, patients were given 15 plastic drinking straws and an equal length of masking tape. Using the materials provided, patients were instructed to build a free standing bridge-like structure to suspend an everyday item (ex: puzzle box) off of the floor or table surface. All materials were required to be used by the team in their design. LRT facilitated post-activity discussion reviewing team process. Patients were encouraged to reflect how the skills used in this activity can be generalized to daily life post discharge.   Education: Pharmacist, Community, Scientist, Physiological, Discharge Planning   Education Outcome: Acknowledges education/In group clarification offered/Needs additional education.    Affect/Mood: Appropriate   Participation Level: Engaged   Participation Quality: Independent   Behavior: Appropriate   Speech/Thought Process: Focused   Insight: Good   Judgement: Good   Modes of Intervention: STEM Activity   Patient Response to Interventions:  Engaged   Education Outcome:  In group clarification offered    Clinical Observations/Individualized Feedback: Pt was bright but skeptical about being able to complete the activity. Pt focused and was able to work with peer on creating a bridge that worked. Pt appeared to feel good about being successful with the activity.     Plan: Continue to engage patient in RT group  sessions 2-3x/week.   Jaymarie Yeakel-McCall, LRT,CTRS  09/23/2023 11:45 AM
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
## 2023-09-23 NOTE — [facility] Suicide Risk Assessment (Signed)
 [facility] INPATIENT:  Family/Significant Other Suicide Prevention Education  Suicide Prevention Education:  Patient Refusal for Family/Significant Other Suicide Prevention Education: The patient Trevor [name] has refused to provide written consent for family/significant other to be provided Family/Significant Other Suicide Prevention Education during admission and/or prior to discharge.  Physician notified.  Jenkins LULLA Primer 09/23/2023, 3:28 PM

## 2023-09-23 NOTE — Plan of Care (Signed)
   Problem: Education: Goal: Emotional status will improve Outcome: Progressing Goal: Mental status will improve Outcome: Progressing Goal: Verbalization of understanding the information provided will improve Outcome: Progressing

## 2023-09-23 NOTE — Progress Notes (Signed)
   09/23/23 0807  Psych Admission Type (Psych Patients Only)  Admission Status Involuntary  Psychosocial Assessment  Patient Complaints Depression  Eye Contact Fair  Facial Expression Flat  Affect Depressed  Speech Logical/coherent  Interaction Minimal;Guarded  Motor Activity Other (Comment) (WDL)  Appearance/Hygiene Unremarkable  Behavior Characteristics Cooperative  Mood Depressed;Anxious  Thought Process  Coherency WDL  Content Blaming others  Delusions None reported or observed  Perception WDL  Hallucination None reported or observed  Judgment Poor  Confusion None  Danger to Self  Current suicidal ideation? Denies  Self-Injurious Behavior No self-injurious ideation or behavior indicators observed or expressed   Agreement Not to Harm Self Yes  Description of Agreement Verbal  Danger to Others  Danger to Others None reported or observed

## 2023-09-24 DIAGNOSIS — F3164 Bipolar disorder, current episode mixed, severe, with psychotic features: Secondary | ICD-10-CM | POA: Diagnosis not present

## 2023-09-24 NOTE — Progress Notes (Signed)
 D: Pt irritable and aggressive. Pt witnessed threatening staff due to being awoken during 15 minutes checks. Pt was amenable to receive IM injection in order to calm down   A: IM medication administed per PRN orders. Support and encouragement provided. Frequent verbal contact made. Routine safety checks conducted q15 minutes.   R: No adverse drug reactions noted. Quietly resting in room. Will continue to monitor.

## 2023-09-24 NOTE — Plan of Care (Signed)
  Problem: Education: Goal: Knowledge of [facility] Education information/materials will improve Outcome: Progressing Goal: Emotional status will improve Outcome: Progressing Goal: Mental status will improve Outcome: Progressing Goal: Verbalization of understanding the information provided will improve Outcome: Progressing   Problem: Coping: Goal: Ability to demonstrate self-control will improve Outcome: Progressing
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
## 2023-09-24 NOTE — Group Note (Signed)
 Date:  09/24/2023 Time:  1:09 PM  Group Topic/Focus:  Goals Group:   The focus of this group is to help patients establish daily goals to achieve during treatment and discuss how the patient can incorporate goal setting into their daily lives to aide in recovery. Orientation:   The focus of this group is to educate the patient on the purpose and policies of crisis stabilization and provide a format to answer questions about their admission.  The group details unit policies and expectations of patients while admitted.    Participation Level:  Did Not Attend  Participation Quality:   n/a  Affect:   n/a  Cognitive:   n/a  Insight: None  Engagement in Group:   n/a  Modes of Intervention:   n/a  Additional Comments:   Pt did not attend the group.  Trevor [name] HERO [name] 09/24/2023, 1:09 PM

## 2023-09-24 NOTE — Plan of Care (Signed)
   Problem: Education: Goal: Knowledge of [facility] Education information/materials will improve Outcome: Progressing Goal: Emotional status will improve Outcome: Progressing Goal: Mental status will improve Outcome: Progressing Goal: Verbalization of understanding the information provided will improve Outcome: Progressing

## 2023-09-24 NOTE — Progress Notes (Signed)
 Nursing Note: 0700-1900   Pt reports that he slept well last night after injections, also shared remorse for the situation that transpired last evening. I just got so mad about the light in my face.  Pt calm throughout shift, remains isolative in room mostly, but does go to cafeteria for meals. I'm a paranoid schizophrenic, I can't be around a lot of people all the time.  Pt calm and cooperative, no physical complaints voiced this shift. Reports that he hears mumbling, but nothing more. Denies visual hallucinations and is able to verbally contract for safety.  Pt. encouraged to verbalize needs and concerns, active listening and support provided.  Continued Q 15 minute safety checks.  Observed active participation in group settings.   09/24/23 1000  Psych Admission Type (Psych Patients Only)  Admission Status Involuntary  Psychosocial Assessment  Patient Complaints Anxiety;Depression;Irritability;Other (Comment) (Remorse for behavior last night.)  Eye Contact Fair  Facial Expression Anxious;Flat  Affect Depressed;Labile  Speech Logical/coherent  Interaction Cautious  Motor Activity Other (Comment)  Appearance/Hygiene Unremarkable  Behavior Characteristics Cooperative  Mood Anxious;Depressed  Thought Process  Coherency WDL  Content Blaming others;Paranoia  Delusions None reported or observed  Perception WDL  Hallucination None reported or observed  Judgment Poor  Confusion None  Danger to Self  Current suicidal ideation? Denies  Self-Injurious Behavior No self-injurious ideation or behavior indicators observed or expressed   Agreement Not to Harm Self Yes  Description of Agreement Verbal  Danger to Others  Danger to Others None reported or observed

## 2023-09-24 NOTE — [facility] Group Notes (Signed)
 Psychoeducational Group Note  Date:  09/24/2023 Time:  2000  Group Topic/Focus:  Did not attend.   Participation Level: Did Not Attend  Participation Quality:  Not Applicable  Affect:  Not Applicable  Cognitive:  Not Applicable  Insight:  Not Applicable  Engagement in Group: Not Applicable  Additional Comments:  Did not attend.   Lenora Shaver S 09/24/2023, 10:00 PM

## 2023-09-25 DIAGNOSIS — F3164 Bipolar disorder, current episode mixed, severe, with psychotic features: Secondary | ICD-10-CM | POA: Diagnosis not present

## 2023-09-25 NOTE — Progress Notes (Signed)
   09/24/23 2145  Psych Admission Type (Psych Patients Only)  Admission Status Involuntary  Psychosocial Assessment  Patient Complaints Anxiety;Depression  Eye Contact Fair  Facial Expression Flat  Affect Labile  Speech Logical/coherent  Interaction Cautious;Minimal  Motor Activity Other (Comment) (WDL)  Appearance/Hygiene Unremarkable  Behavior Characteristics Cooperative  Mood Depressed;Anxious  Thought Process  Coherency WDL  Content Blaming others;Paranoia  Delusions Paranoid  Perception Hallucinations  Hallucination Auditory  Judgment Poor  Confusion None  Danger to Self  Current suicidal ideation? Denies  Self-Injurious Behavior No self-injurious ideation or behavior indicators observed or expressed   Agreement Not to Harm Self Yes  Description of Agreement verbal  Danger to Others  Danger to Others None reported or observed

## 2023-09-25 NOTE — Group Note (Signed)
 Date:  09/25/2023 Time:  8:53 PM  Group Topic/Focus:  Wrap-Up Group:   The focus of this group is to help patients review their daily goal of treatment and discuss progress on daily workbooks.    Participation Level:  Did Not Attend  Participation Quality:   N/A  Affect:   N/A  Cognitive:   N/A  Insight: None  Engagement in Group:   N/A  Modes of Intervention:   N/A  Additional Comments:  Patient did not attend wrap up group.   Eward Mace 09/25/2023, 8:53 PM

## 2023-09-25 NOTE — Plan of Care (Signed)
  Problem: Education: Goal: Mental status will improve Outcome: Progressing   Problem: Activity: Goal: Interest or engagement in activities will improve Outcome: Progressing   Problem: Coping: Goal: Ability to verbalize frustrations and anger appropriately will improve Outcome: Progressing   Problem: Health Behavior/Discharge Planning: Goal: Compliance with treatment plan for underlying cause of condition will improve Outcome: Progressing   Problem: Safety: Goal: Periods of time without injury will increase Outcome: Progressing
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
## 2023-09-25 NOTE — Group Note (Signed)
 Date:  09/25/2023 Time:  6:44 PM  Group Topic/Focus:  Goals Group:   The focus of this group is to help patients establish daily goals to achieve during treatment and discuss how the patient can incorporate goal setting into their daily lives to aide in recovery. Orientation:   The focus of this group is to educate the patient on the purpose and policies of crisis stabilization and provide a format to answer questions about their admission.  The group details unit policies and expectations of patients while admitted.    Participation Level:  Did Not Attend   Trevor Trevor Mars 09/25/2023, 6:44 PM

## 2023-09-25 NOTE — Plan of Care (Signed)
   Problem: Safety: Goal: Periods of time without injury will increase Outcome: Progressing

## 2023-09-25 NOTE — Group Note (Signed)
 LCSW Group Therapy Note  Group Date: 09/25/2023 Start Time: 1000 End Time: 1100   Type of Therapy and Topic:  Group Therapy: Wellness/ Positive Affirmations  Participation Level:  Did Not Attend   Description of Group:   This group addressed positive affirmation towards self and others.  Patients went around the room and identified two positive things about themselves and two positive things about a peer in the room.  Patients reflected on how it felt to share something positive with others, to identify positive things about themselves, and to hear positive things from others/ Patients were encouraged to have a daily reflection of positive characteristics or circumstances.   Therapeutic Goals: Patients will verbalize two of their positive qualities Patients will demonstrate empathy for others by stating two positive qualities about a peer in the group Patients will verbalize their feelings when voicing positive self affirmations and when voicing positive affirmations of others Patients will discuss the potential positive impact on their wellness/recovery of focusing on positive traits of self and others.  Summary of Patient Progress:  He  actively engaged in the discussion and . He was able to identify positive affirmations about hisself as well as other group members. Patient demonstrated his insight into the subject matter, was respectful of peers, participated throughout the entire session.  Therapeutic Modalities:   Cognitive Behavioral Therapy Motivational Interviewing    Golda Louder, LCSWA 09/25/2023  3:29 PM

## 2023-09-25 NOTE — [facility] Group Notes (Signed)
 [facility] Group Notes:  (Nursing)  Date:  09/25/2023  Time:  1400  Type of Therapy:  Psychoeducational Skills  Participation Level:  Did Not Attend   Shela Nevin 09/25/2023, 4:06 PM

## 2023-09-25 NOTE — Progress Notes (Signed)
 Patient stated he slept well last night and has been eating adequately. Patient presents in animated mood and socializing appropriately. Patient denies SI,HI, and A/V/H with no plan or intent but does report his depression and anxiety are a 10 out of 10. Patient received hydroxyzine  which helped provide some relief. Patient denies any pain and states his goal for today is to go to group and will get out of bed. Patient is med compliant and cooperative at this time.

## 2023-09-25 NOTE — Progress Notes (Signed)
   09/25/23 2213  Psych Admission Type (Psych Patients Only)  Admission Status Involuntary  Psychosocial Assessment  Patient Complaints Anxiety  Eye Contact Fair  Facial Expression Animated  Affect Appropriate to circumstance  Speech Logical/coherent  Interaction Assertive  Motor Activity Other (Comment) (WDL)  Appearance/Hygiene Unremarkable  Behavior Characteristics Appropriate to situation  Mood Pleasant;Anxious  Thought Process  Coherency WDL  Content WDL  Delusions None reported or observed  Perception WDL  Hallucination None reported or observed  Judgment Poor  Confusion None  Danger to Self  Current suicidal ideation? Denies  Self-Injurious Behavior No self-injurious ideation or behavior indicators observed or expressed   Agreement Not to Harm Self Yes  Description of Agreement verbal  Danger to Others  Danger to Others None reported or observed

## 2023-09-25 NOTE — Progress Notes (Signed)
   09/25/23 0558  15 Minute Checks  Location Bedroom  Visual Appearance Calm  Behavior Sleeping  Sleep (Behavioral Health Patients Only)  Calculate sleep? (Click Yes once per 24 hr at 0600 safety check) Yes  Documented sleep last 24 hours 12.25

## 2023-09-26 ENCOUNTER — Encounter (HOSPITAL_COMMUNITY): Payer: Self-pay

## 2023-09-26 DIAGNOSIS — F319 Bipolar disorder, unspecified: Secondary | ICD-10-CM | POA: Diagnosis not present

## 2023-09-26 MED ORDER — NICOTINE POLACRILEX 2 MG MT GUM
2.0000 mg | CHEWING_GUM | OROMUCOSAL | Status: DC | PRN
  Administered 2023-09-26: 2 mg via ORAL
  Filled 2023-09-26: qty 1

## 2023-09-26 MED ORDER — HYDROXYZINE HCL 50 MG PO TABS
50.0000 mg | ORAL_TABLET | ORAL | Status: DC | PRN
  Administered 2023-09-26 – 2023-09-27 (×4): 50 mg via ORAL
  Filled 2023-09-26: qty 20
  Filled 2023-09-26 (×4): qty 1

## 2023-09-26 MED ORDER — ESCITALOPRAM OXALATE 5 MG PO TABS: 5.0000 mg | ORAL_TABLET | Freq: Every day | ORAL | 0 refills | Status: DC

## 2023-09-26 MED ORDER — QUETIAPINE FUMARATE 200 MG PO TABS
200.0000 mg | ORAL_TABLET | Freq: Every day | ORAL | Status: DC
  Administered 2023-09-26: 200 mg via ORAL
  Filled 2023-09-26 (×2): qty 1
  Filled 2023-09-26: qty 14

## 2023-09-26 MED ORDER — GABAPENTIN 300 MG PO CAPS: 300.0000 mg | ORAL_CAPSULE | Freq: Three times a day (TID) | ORAL | 0 refills | Status: DC

## 2023-09-26 MED ORDER — NICOTINE POLACRILEX 2 MG MT GUM: 2.0000 mg | CHEWING_GUM | OROMUCOSAL | 0 refills | Status: DC | PRN

## 2023-09-26 MED ORDER — NICOTINE 21 MG/24HR TD PT24: 21.0000 mg | MEDICATED_PATCH | Freq: Every day | TRANSDERMAL | 0 refills | Status: DC

## 2023-09-26 MED ORDER — HYDROXYZINE HCL 50 MG PO TABS: 50.0000 mg | ORAL_TABLET | ORAL | 0 refills | Status: DC | PRN

## 2023-09-26 MED ORDER — QUETIAPINE FUMARATE 200 MG PO TABS: 200.0000 mg | ORAL_TABLET | Freq: Every day | ORAL | 0 refills | Status: DC

## 2023-09-26 MED ORDER — VITAMIN D (ERGOCALCIFEROL) 1.25 MG (50000 UNIT) PO CAPS
50000.0000 [IU] | ORAL_CAPSULE | ORAL | Status: DC
  Administered 2023-09-27: 50000 [IU] via ORAL
  Filled 2023-09-26: qty 1

## 2023-09-26 NOTE — Progress Notes (Signed)
   09/26/23 0530  15 Minute Checks  Location Bedroom  Visual Appearance Calm  Behavior Sleeping  Sleep (Behavioral Health Patients Only)  Calculate sleep? (Click Yes once per 24 hr at 0600 safety check) Yes  Documented sleep last 24 hours 8.5
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
## 2023-09-26 NOTE — Plan of Care (Signed)
   Problem: Activity: Goal: Interest or engagement in activities will improve Outcome: Progressing   Problem: Health Behavior/Discharge Planning: Goal: Compliance with treatment plan for underlying cause of condition will improve Outcome: Progressing   Problem: Safety: Goal: Periods of time without injury will increase Outcome: Progressing

## 2023-09-26 NOTE — Progress Notes (Signed)
   09/26/23 2047  Psych Admission Type (Psych Patients Only)  Admission Status Involuntary  Psychosocial Assessment  Patient Complaints Anxiety;Anger;Restlessness  Eye Contact Fair  Facial Expression Animated  Affect Anxious  Speech Logical/coherent  Interaction Assertive  Motor Activity Other (Comment) (WDL)  Appearance/Hygiene Unremarkable  Behavior Characteristics Anxious  Mood Irritable;Angry  Thought Process  Coherency WDL  Content WDL  Delusions None reported or observed  Perception WDL  Hallucination None reported or observed  Judgment Poor  Confusion None  Danger to Self  Current suicidal ideation? Denies  Danger to Others  Danger to Others None reported or observed    Patient remained restless and anxious and unable to calm himself down.  Patient requested to received his medication now.  Patient denies SI/HI/AVH.  Administered scheduled medication per patient's request.  Patient apologized.  Patient reports he is going to bed.  Patient is safe on the unit with q15 minute safety checks.

## 2023-09-26 NOTE — Plan of Care (Signed)
   Problem: Education: Goal: Mental status will improve Outcome: Progressing   Problem: Activity: Goal: Interest or engagement in activities will improve Outcome: Progressing
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
## 2023-09-26 NOTE — BHH Suicide Risk Assessment (Signed)
 [facility] Discharge Suicide Risk Assessment   Principal Problem: Bipolar disorder (manic depression) (HCC)  Discharge Diagnoses: Principal Problem:   Bipolar disorder (manic depression) (HCC) Active Problems:   Psychoactive substance-induced organic hallucinosis (HCC)      Total Time spent with patient: 40  Musculoskeletal: Strength & Muscle Tone: within normal limits Gait & Station: normal Patient leans: N/A   Psychiatric Specialty Exam:  Presentation  General Appearance: Casual; Fairly Groomed  Eye Contact: Good  Speech: Normal Rate  Speech Volume: Normal  Handedness: Right   Mood and Affect  Mood: Dysphoric  Affect: Congruent; Constricted   Thought Process  Thought Processes: Linear  Descriptions of Associations: Intact  Orientation: Full (Time, Place and Person)  Thought Content: Logical  History of Schizophrenia/Schizoaffective disorder: No  Duration of Psychotic Symptoms: NA Hallucinations: Hallucinations: None  Ideas of Reference: None  Suicidal Thoughts: Suicidal Thoughts: Yes, Passive (reports chronic, at baseline) SI Active Intent and/or Plan: Without Intent; Without Plan  Homicidal Thoughts: Homicidal Thoughts: No   Sensorium  Memory: Immediate Good; Recent Good; Remote Good  Judgment: Fair  Insight: Fair   Chartered Certified Accountant: Fair  Attention Span: Fair  Recall: Good  Fund of Knowledge: Good  Language: Good   Psychomotor Activity  Psychomotor Activity: Psychomotor Activity: Normal   Assets  Assets: Resilience   Sleep  Sleep: Sleep: Fair Number of Hours of Sleep: 8.5   Physical Exam: General: Sitting comfortably. NAD. HEENT: Normocephalic, atraumatic, MMM, EMOI Lungs: no increased work of breathing noted Heart: no cyanosis Abdomen: Non distended Musculoskeletal: FROM. No obvious deformities Skin: Warm, dry, intact. No rashes noted Neuro: No obvious focal deficits.  Gait and station are  normal  Review of Systems  Constitutional: Negative.   HENT: Negative.    Eyes: Negative.   Respiratory: Negative.    Cardiovascular: Negative.   Gastrointestinal: Negative.   Genitourinary: Negative.   Skin: Negative.   Neurological: Negative.   Psychiatric/Behavioral:  Negative   Mental Status Per Nursing Assessment: NA  Demographic Factors:  Male, Caucasian, and Unemployed  Loss Factors: NA  Historical Factors: Family history of mental illness or substance abuse and Impulsivity  Risk Reduction Factors:   Positive coping skills or problem solving skills  Continued Clinical Symptoms:  Previous psychiatric diagnoses and treatments  Cognitive Features That Contribute To Risk:  None  Suicide Risk:  Minimal: No identifiable suicidal ideation.  Patients presenting with no risk factors but with morbid ruminations; may be classified as minimal risk based on the severity of the depressive symptoms.    Follow-up Information     [facility]. Schedule an appointment as soon as possible for a visit.   Why: Please call this provider personally, if you wish to schedule an appointment for therapy services after completing [facility] program. Contact information: [address] [place], [place] [address] [phone]         [facility] Go on 10/14/2023.   Why: You have an appointment for medication management services on 10/14/23 at 11:10 am. The appointment will be held in person. Contact information: [address] [place] [address] [phone]         Services[facility] Follow up.   Why: You will begin treatment at this facility on Tuesday, 09/27/23. You will be picked up and brought to [facility] for 9:00AM. You will receive substance use treatment, medication, and therapy during the duration of [facility]. Contact information: [address]  [place]  [address] [phone]                  Plan Of Care/Follow-up recommendations:  Activity: as tolerated  Diet: heart healthy  Other: -Follow-up with your outpatient psychiatric provider -instructions on appointment date, time, and address (location) are provided to you in discharge paperwork.  -Take your psychiatric medications as prescribed at discharge - instructions are provided to you in the discharge paperwork  -Follow-up with outpatient primary care doctor and other specialists -for management of preventative medicine and chronic medical issues  -Testing: Follow-up with outpatient provider for abnormal lab results: hypovitaminosis d  -If you are prescribed an atypical antipsychotic medication, we recommend that your outpatient psychiatrist follow routine screening for side effects within 3 months of discharge, including monitoring: AIMS scale, height, weight, blood pressure, fasting lipid panel, HbA1c, and fasting blood sugar.   -Recommend total abstinence from alcohol, tobacco, and other illicit drug use at discharge.   -If your psychiatric symptoms recur, worsen, or if you have side effects to your psychiatric medications, call your outpatient psychiatric provider, 911, [phone] or go to the nearest emergency department.  -If suicidal thoughts occur, immediately call your outpatient psychiatric provider, 911, [phone] or go to the nearest emergency department.   Glendia Kitty, MD 09/26/23 6:05 PM

## 2023-09-26 NOTE — Progress Notes (Signed)
     09/26/2023       8:21 AM   Trevor [name]   Type of Note: [facility]  Spoke with Jordan from [facility] this morning, pt WAS accepted and was supposed to discharge from [facility] this morning at 8:00AM to arrive to their facility by 9:00AM. Though pt has private insurance, he also has MCD and Olivia from intake at [facility] had confirmed last week that pt could be admitted.   Pt will be accepted to [facility] tomorrow morning instead, 09/27/23 by 9:00AM. CSW will inform team in progression this morning.   Signed:  Magan Winnett, LCSW-A 09/26/2023  8:21 AM

## 2023-09-26 NOTE — Progress Notes (Signed)
   09/26/23 0740  Psych Admission Type (Psych Patients Only)  Admission Status Involuntary  Psychosocial Assessment  Patient Complaints Anxiety  Eye Contact Fair  Facial Expression Animated  Affect Appropriate to circumstance  Speech Logical/coherent  Interaction Assertive  Motor Activity Other (Comment) (WDL)  Appearance/Hygiene Unremarkable  Behavior Characteristics Cooperative;Appropriate to situation  Mood Pleasant;Anxious  Thought Process  Coherency WDL  Content WDL  Delusions None reported or observed  Perception WDL  Hallucination None reported or observed  Judgment Poor  Confusion None  Danger to Self  Current suicidal ideation? Denies  Self-Injurious Behavior No self-injurious ideation or behavior indicators observed or expressed   Agreement Not to Harm Self Yes  Description of Agreement verbal  Danger to Others  Danger to Others None reported or observed

## 2023-09-26 NOTE — Discharge Instructions (Signed)
-  Follow-up with your outpatient psychiatric provider -instructions on appointment date, time, and address (location) are provided to you in discharge paperwork.  -Take your psychiatric medications as prescribed at discharge - instructions are provided to you in the discharge paperwork For discharge to [facility], you will get medication samples for 14 days.  You will get printed prescriptions for 30 days.   -Follow-up with outpatient primary care doctor and other specialists -for management of preventative medicine and any chronic medical disease.  -Recommend abstinence from alcohol, tobacco, and other illicit drug use at discharge.   -If your psychiatric symptoms recur, worsen, or if you have side effects to your psychiatric medications, call your outpatient psychiatric provider, 911, [phone] or go to the nearest emergency department.  -If suicidal thoughts occur, call your outpatient psychiatric provider, 911, [phone] or go to the nearest emergency department.  Naloxone  (Narcan ) can help reverse an overdose when given to the victim quickly.  [facility] offers free naloxone  kits and instructions/training on its use.  Add naloxone  to your first aid kit and you can help save a life.   Pick up your free kit at the following locations:   [place]:  [facility], [address] [place] [address] [phone]) [facility] [address] [place] [address] [phone]) [facility] [address] [place] [address]  [place]: [facility] [address] [place] [address] ([phone]) [facility] [address] [place] [address] [phone])
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
## 2023-09-26 NOTE — BH IP Treatment Plan (Signed)
 Interdisciplinary Treatment and Diagnostic Plan Update  09/26/2023 Time of Session: 12:05PM - UPDATE Trevor [name] MRN: [id number]  Principal Diagnosis: Bipolar disorder (manic depression) (HCC)  Secondary Diagnoses: Principal Problem:   Bipolar disorder (manic depression) (HCC) Active Problems:   Psychoactive substance-induced organic hallucinosis (HCC)   Current Medications:  Current Facility-Administered Medications  Medication Dose Route Frequency Provider Last Rate Last Admin   acetaminophen  (TYLENOL ) tablet 650 mg  650 mg Oral Q4H PRN [name], [name], NP       alum & mag hydroxide-simeth (MAALOX/MYLANTA) 200-200-20 MG/5ML suspension 30 mL  30 mL Oral Q6H PRN [name], [name], NP       haloperidol  (HALDOL ) tablet 5 mg  5 mg Oral TID PRN [name], [name], NP   5 mg at 09/21/23 2039   And   diphenhydrAMINE  (BENADRYL ) capsule 50 mg  50 mg Oral TID PRN [name], [name], NP   50 mg at 09/21/23 2039   haloperidol  lactate (HALDOL ) injection 5 mg  5 mg Intramuscular TID PRN [name], [name], NP       And   diphenhydrAMINE  (BENADRYL ) injection 50 mg  50 mg Intramuscular TID PRN [name], [name], NP       And   LORazepam  (ATIVAN ) injection 2 mg  2 mg Intramuscular TID PRN [name], [name], NP       haloperidol  lactate (HALDOL ) injection 10 mg  10 mg Intramuscular TID PRN [name], [name], NP   10 mg at 09/23/23 2107   And   diphenhydrAMINE  (BENADRYL ) injection 50 mg  50 mg Intramuscular TID PRN [name], [name], NP   50 mg at 09/23/23 2107   And   LORazepam  (ATIVAN ) injection 2 mg  2 mg Intramuscular TID PRN [name], [name], NP   2 mg at 09/23/23 2107   escitalopram  (LEXAPRO ) tablet 5 mg  5 mg Oral Daily [name], [name], MD   5 mg at 09/26/23 0741   feeding supplement (ENSURE ENLIVE / ENSURE PLUS) liquid 237 mL  237 mL Oral BID BM [name], [name], MD   237 mL at 09/26/23 1327   gabapentin  (NEURONTIN ) capsule 300 mg  300 mg Oral TID [name], Vincent  A, MD   300 mg at 09/26/23 1158   hydrOXYzine  (ATARAX ) tablet 50 mg  50 mg Oral Q4H PRN Parker, Alvin S, MD   50 mg at 09/26/23 1353   magnesium  hydroxide (MILK OF MAGNESIA) suspension 30 mL  30 mL Oral Daily PRN [name], [name], NP       nicotine  (NICODERM CQ  - dosed in mg/24 hours) patch 21 mg  21 mg Transdermal Daily [name], [name], NP   21 mg at 09/26/23 0741   ondansetron  (ZOFRAN ) tablet 4 mg  4 mg Oral Q8H PRN [name], [name], NP       QUEtiapine  (SEROQUEL ) tablet 200 mg  200 mg Oral QHS Parker, Alvin S, MD       PTA Medications: Medications Prior to Admission  Medication Sig Dispense Refill Last Dose/Taking   acetaminophen  (TYLENOL ) 325 MG tablet Take 2 tablets (650 mg total) by mouth every 6 (six) hours as needed for mild pain (pain score 1-3), moderate pain (pain score 4-6) or fever. (Patient not taking: Reported on 09/18/2023)      busPIRone  (BUSPAR ) 7.5 MG tablet Take 1 tablet (7.5 mg total) by mouth 2 (two) times daily. 60 tablet 0    escitalopram  (LEXAPRO ) 20 MG tablet Take 1 tablet (20 mg total)  by mouth daily. 30 tablet 0    gabapentin  (NEURONTIN ) 100 MG capsule Take 1 capsule (100 mg total) by mouth 2 (two) times daily. 60 capsule 0    hydrOXYzine  (ATARAX ) 50 MG tablet Take 1 tablet (50 mg total) by mouth every 6 (six) hours as needed for anxiety. (Patient not taking: Reported on 09/18/2023) 15 tablet 0    QUEtiapine  (SEROQUEL ) 200 MG tablet Take 1 tablet (200 mg total) by mouth at bedtime. 30 tablet 0     Patient Stressors: Financial difficulties   Health problems   Medication change or noncompliance   Substance abuse    Patient Strengths: Ability for insight  Active sense of humor  Motivation for treatment/growth   Treatment Modalities: Medication Management, Group therapy, Case management,  1 to 1 session with clinician, Psychoeducation, Recreational therapy.   Physician Treatment Plan for Primary Diagnosis: Bipolar disorder (manic depression)  (HCC) Long Term Goal(s): Improvement in symptoms so as ready for discharge   Short Term Goals: Ability to identify changes in lifestyle to reduce recurrence of condition will improve Ability to verbalize feelings will improve Ability to disclose and discuss suicidal ideas Ability to demonstrate self-control will improve Ability to identify and develop effective coping behaviors will improve Ability to maintain clinical measurements within normal limits will improve Compliance with prescribed medications will improve Ability to identify triggers associated with substance abuse/mental health issues will improve  Medication Management: Evaluate patient's response, side effects, and tolerance of medication regimen.  Therapeutic Interventions: 1 to 1 sessions, Unit Group sessions and Medication administration.  Evaluation of Outcomes: Progressing  Physician Treatment Plan for Secondary Diagnosis: Principal Problem:   Bipolar disorder (manic depression) (HCC) Active Problems:   Psychoactive substance-induced organic hallucinosis (HCC)  Long Term Goal(s): Improvement in symptoms so as ready for discharge   Short Term Goals: Ability to identify changes in lifestyle to reduce recurrence of condition will improve Ability to verbalize feelings will improve Ability to disclose and discuss suicidal ideas Ability to demonstrate self-control will improve Ability to identify and develop effective coping behaviors will improve Ability to maintain clinical measurements within normal limits will improve Compliance with prescribed medications will improve Ability to identify triggers associated with substance abuse/mental health issues will improve     Medication Management: Evaluate patient's response, side effects, and tolerance of medication regimen.  Therapeutic Interventions: 1 to 1 sessions, Unit Group sessions and Medication administration.  Evaluation of Outcomes: Progressing   RN  Treatment Plan for Primary Diagnosis: Bipolar disorder (manic depression) (HCC) Long Term Goal(s): Knowledge of disease and therapeutic regimen to maintain health will improve  Short Term Goals: Ability to remain free from injury will improve, Ability to verbalize frustration and anger appropriately will improve, Ability to participate in decision making will improve, Ability to verbalize feelings will improve, Ability to identify and develop effective coping behaviors will improve, and Compliance with prescribed medications will improve  Medication Management: RN will administer medications as ordered by provider, will assess and evaluate patient's response and provide education to patient for prescribed medication. RN will report any adverse and/or side effects to prescribing provider.  Therapeutic Interventions: 1 on 1 counseling sessions, Psychoeducation, Medication administration, Evaluate responses to treatment, Monitor vital signs and CBGs as ordered, Perform/monitor CIWA, COWS, AIMS and Fall Risk screenings as ordered, Perform wound care treatments as ordered.  Evaluation of Outcomes: Progressing   LCSW Treatment Plan for Primary Diagnosis: Bipolar disorder (manic depression) (HCC) Long Term Goal(s): Safe transition to appropriate next level  of care at discharge, Engage patient in therapeutic group addressing interpersonal concerns.  Short Term Goals: Engage patient in aftercare planning with referrals and resources, Increase social support, Increase emotional regulation, and Facilitate patient progression through stages of change regarding substance use diagnoses and concerns  Therapeutic Interventions: Assess for all discharge needs, 1 to 1 time with Social worker, Explore available resources and support systems, Assess for adequacy in community support network, Educate family and significant other(s) on suicide prevention, Complete Psychosocial Assessment, Interpersonal group  therapy.  Evaluation of Outcomes: Progressing   Progress in Treatment: Attending groups: patients attends some groups Participating in groups: Yes. Taking medication as prescribed: Yes. Toleration medication: Yes. Family/Significant other contact made: patient doesn't have close family:  his mother passed away, and he doesn't get along with his brother.  CSW called [employer], [phone] Patient understands diagnosis: Yes. Discussing patient identified problems/goals with staff: Yes. Medical problems stabilized or resolved: Yes. Denies suicidal/homicidal ideation: Yes. Issues/concerns per patient self-inventory: No.   New problem(s) identified: No   New Short Term/Long Term Goal(s):   medication stabilization, elimination of SI thoughts, development of comprehensive mental wellness plan.     Patient Goals:  I'm not doing good.  I want meds to start working.  I want to kill myself every day but I'm not saying I would act on it.  Patient said that he wants to go to a long-term dual diagnosis treatment program.   Discharge Plan or Barriers:  Patient recently admitted. CSW will continue to follow and assess for appropriate referrals and possible discharge planning.     Reason for Continuation of Hospitalization: Hallucinations Medication stabilization Suicidal ideation   Estimated Length of Stay:  1 day, pt will arrive to [facility] tomorrow morning  Last 3 [place] Suicide Severity Risk Score: Flowsheet Row [facility] from 09/17/2023 in [facility] Department at [facility] Admission (Discharged) from 08/31/2023 in [facility] INPAT[facility]INE  C-SSRS RISK CATEGORY High Risk High Risk Low Risk       Last PHQ 2/9 Scores:     No data to display          Scribe for Treatment Team: Jenkins LULLA Primer, LCSWA 09/26/2023 2:01 PM

## 2023-09-26 NOTE — Transportation (Signed)
 09/26/2023  Trevor [name] DOB: [birth year] MRN: [id number]   RIDER WAIVER AND RELEASE OF LIABILITY  For the purposes of helping with transportation needs, [facility] partners with outside transportation providers (taxi companies, [place], catering manager.) to give [facility] patients or other approved people the choice of on-demand rides Public Librarian) to our buildings for non-emergency visits.  By using [employer], I, the person signing this document, on behalf of myself and/or any legal minors (in my care using the [employer]), agree:  Science Writer given to me are supplied by independent, outside transportation providers who do not work for, or have any affiliation with, [employer]. [employer] is not a transportation company. [facility] has no control over the quality or safety of the rides I get using [employer]. [facility] has no control over whether any outside ride will happen on time or not. [facility] gives no guarantee on the reliability, quality, safety, or availability on any rides, or that no mistakes will happen. I know and accept that traveling by vehicle (car, truck, SVU, fleeta, bus, taxi, etc.) has risks of serious injuries such as disability, being paralyzed, and death. I know and agree the risk of using [employer] is mine alone, and not [employer]. [employer] are provided as is and as are available. The transportation providers are in charge for all inspections and care of the vehicles used to provide these rides. I agree not to take legal action against [facility], its agents, employees, officers, directors, representatives, insurers, attorneys, assigns, successors, subsidiaries, and affiliates at any time for any reasons related directly or indirectly to using [employer]. I also agree not to take legal action against [facility] or its affiliates for any injury, death, or damage to property caused by or related to using  [employer]. I have read this Waiver and Release of Liability, and I understand the terms used in it and their legal meaning. This Waiver is freely and voluntarily given with the understanding that my right (or any legal minors) to legal action against [facility] relating to [employer] is knowingly given up to use these services.   I attest that I read the Ride Waiver and Release of Liability to Trevor [name], gave Mr. Xia the opportunity to ask questions and answered the questions asked (if any). I affirm that Trevor [name] then provided consent for assistance with transportation.

## 2023-09-26 NOTE — BHH Group Notes (Signed)
 Adult Psychoeducational Group Note  Date:  09/26/2023 Time:  10:21 AM  Group Topic/Focus:  Goals Group:   The focus of this group is to help patients establish daily goals to achieve during treatment and discuss how the patient can incorporate goal setting into their daily lives to aide in recovery.  Participation Level:  Did Not Attend  Participation Quality:  na  Affect:  na  Cognitive:  na  Insight: na  Engagement in Group:  na  Modes of Intervention:  na  Additional Comments:   Did not attend group  Chelbie Jarnagin 09/26/2023, 10:21 AM

## 2023-09-26 NOTE — Progress Notes (Signed)
  [facility] Adult Case Management Discharge Plan :  Will you be returning to the same living situation after discharge:  No. Pt will be going to [facility] at discharge At discharge, do you have transportation home?: Yes,  CSW arranged [employer] taxi for 8AM Do you have the ability to pay for your medications: Yes,  supply being given along with a script  Release of information consent forms completed and in the chart;  Patient's signature needed at discharge.  Patient to Follow up at:  Follow-up Information     [facility]. Schedule an appointment as soon as possible for a visit.   Why: Please call this provider personally, if you wish to schedule an appointment for therapy services after completing [facility] program. Contact information: [address] [place], [place] [address] [phone]         [facility] Go on 10/14/2023.   Why: You have an appointment for medication management services on 10/14/23 at 11:10 am. The appointment will be held in person. Contact information: [address] [place] [address] [phone]         [facility] Follow up.   Why: You will begin treatment at this facility on Tuesday, 09/27/23. You will be picked up and brought to [facility] for 9:00AM. You will receive substance use treatment, medication, and therapy during the duration of [facility]. Contact information: [place] [place] [address] [phone]                 Next level of care provider has access to [facility] Link:no  Safety Planning and Suicide Prevention discussed: No. Pt declined     Has patient been referred to the Quitline?: Patient refused referral for treatment  Patient has been referred for addiction treatment: Pt will be discharging to [facility] in [facility] for their substance use treatment program.   Jenkins LULLA Primer, LCSWA 09/26/2023,  4:11 PM

## 2023-09-26 NOTE — Group Note (Signed)
 Recreation Therapy Group Note   Group Topic:Problem Solving  Group Date: 09/26/2023 Start Time: 0940 End Time: 1011 Facilitators: Trena Dunavan-McCall, LRT,CTRS Location: [address] Dayroom   Group Topic: Communication, Team Building, Problem Solving  Goal Area(s) Addresses:  Patient will effectively work with peer towards shared goal.  Patient will identify skills used to make activity successful.  Patient will identify how skills used during activity can be applied to reach post d/c goals.   Intervention: STEM Activity- Glass Blower/designer  Group Description: Tallest Pharmacist, Community. In teams of 5-6, patients were given 11 craft pipe cleaners. Using the materials provided, patients were instructed to compete again the opposing team(s) to build the tallest free-standing structure from floor level. The activity was timed; difficulty increased by clinical research associate as production designer, theatre/television/film continued.  Systematically resources were removed with additional directions for example, placing one arm behind their back, working in silence, and shape stipulations. LRT facilitated post-activity discussion reviewing team processes and necessary communication skills involved in completion. Patients were encouraged to reflect how the skills utilized, or not utilized, in this activity can be incorporated to positively impact support systems post discharge.  Education: Pharmacist, Community, Scientist, Physiological, Discharge Planning   Education Outcome: Acknowledges education/In group clarification offered/Needs additional education.    Affect/Mood: Appropriate   Participation Level: Engaged   Participation Quality: Independent   Behavior: Appropriate   Speech/Thought Process: Focused   Insight: Good   Judgement: Good   Modes of Intervention: STEM Activity   Patient Response to Interventions:  Engaged   Education Outcome:  In group clarification offered    Clinical Observations/Individualized Feedback: Pt was  appropriate and engaged in activity. Pt was active in helping peers create and come up with ideas for structure. Pt worked well with peers and was focused.     Plan: Continue to engage patient in RT group sessions 2-3x/week.   Donis Kotowski-McCall, LRT,CTRS 09/26/2023 12:19 PM

## 2023-09-26 NOTE — Progress Notes (Signed)
 Writer was notified by RN that patient had hit his hand on the wall and was bleeding however area was cleaned and rap with sterile gauze and tape.  Per the RN patient was not in any pain and is able to move all fingers. RN to monitor patient periodically as a shift progresses.

## 2023-09-26 NOTE — Group Note (Signed)
 Date:  09/27/2023 Time:  5:29 AM  Group Topic/Focus:  Wrap-Up Group:   The focus of this group is to help patients review their daily goal of treatment and discuss progress on daily workbooks.    Participation Level:  Active  Participation Quality:  Appropriate and Sharing  Affect:  Appropriate  Cognitive:  Appropriate  Insight: Appropriate  Engagement in Group:  Engaged  Modes of Intervention:  Activity and Socialization  Additional Comments:  Patients used wrap up group sheets as guide when sharing during group. Patient rated his day a 5/10. Patient shared his goal for today being to stay focus and shared that he did not achieve that goal. Patient sharing a coping skills that he finds most helpful is being mindful of others. Patient shared something he likes about himself is everything and his love for others. Patient did not participate in group activity and left group early due to confrontation with another patient.     Eward Mace 09/27/2023, 5:29 AM

## 2023-09-26 NOTE — Progress Notes (Addendum)
  Patient remains anxious.  Administered PRN Hydroxyzine per MAR per patient request.  Assessed patient bandage to right hand.  Bandages were clean and intact.  Patient reported no pain and is moving hand with no problem.  Will continue to monitor.

## 2023-09-27 DIAGNOSIS — F319 Bipolar disorder, unspecified: Secondary | ICD-10-CM

## 2023-09-27 NOTE — Progress Notes (Signed)
  Patient is up with complaints of pain.  Patient rated pain 5/10 and described it as, "withdrawal pain."  Administered PRN Tylenol per [facility] per patient request.

## 2023-09-27 NOTE — Progress Notes (Signed)
 Pt discharged to lobby. Pt was stable and appreciative at that time. All papers, samples and prescriptions were given and valuables returned. Verbal understanding expressed. Denies SI/HI and A/VH. Pt given opportunity to express concerns and ask questions.

## 2023-09-27 NOTE — Progress Notes (Signed)
 Patient complained of anxiety 10/10 related to leaving today.  Administered PRN Hydroxyzine per [facility] per patient request.

## 2023-09-27 NOTE — Progress Notes (Signed)
   09/27/23 0544  15 Minute Checks  Location Bedroom  Visual Appearance Calm  Behavior Sleeping  Sleep (Behavioral Health Patients Only)  Calculate sleep? (Click Yes once per 24 hr at 0600 safety check) Yes  Documented sleep last 24 hours 7.25

## 2023-09-27 NOTE — Plan of Care (Signed)
  Problem: Activity: Goal: Interest or engagement in activities will improve Outcome: Progressing   Problem: Medication: Goal: Compliance with prescribed medication regimen will improve Outcome: Progressing   

## 2023-09-27 NOTE — Group Note (Signed)
 Date:  09/27/2023 Time:  10:22 AM  Group Topic/Focus:  Goals Group:   The focus of this group is to help patients establish daily goals to achieve during treatment and discuss how the patient can incorporate goal setting into their daily lives to aide in recovery. Orientation:   The focus of this group is to educate the patient on the purpose and policies of crisis stabilization and provide a format to answer questions about their admission.  The group details unit policies and expectations of patients while admitted.    Participation Level:  Did Not Attend   Trevor Trevor Mars 09/27/2023, 10:22 AM

## 2023-10-04 ENCOUNTER — Other Ambulatory Visit (HOSPITAL_COMMUNITY): Payer: Self-pay | Admitting: Psychiatry

## 2023-10-09 ENCOUNTER — Emergency Department (HOSPITAL_COMMUNITY)
Admission: EM | Admit: 2023-10-09 | Discharge: 2023-10-09 | Disposition: A | Discharge status: Home / Self Care | Payer: BLUE CROSS/BLUE SHIELD | Attending: Emergency Medicine | Admitting: Emergency Medicine

## 2023-10-09 ENCOUNTER — Emergency Department (HOSPITAL_COMMUNITY): Payer: BLUE CROSS/BLUE SHIELD

## 2023-10-09 ENCOUNTER — Other Ambulatory Visit: Payer: Self-pay

## 2023-10-09 ENCOUNTER — Encounter (HOSPITAL_COMMUNITY): Payer: Self-pay | Admitting: *Deleted

## 2023-10-09 DIAGNOSIS — Z20822 Contact with and (suspected) exposure to covid-19: Secondary | ICD-10-CM | POA: Insufficient documentation

## 2023-10-09 DIAGNOSIS — J101 Influenza due to other identified influenza virus with other respiratory manifestations: Secondary | ICD-10-CM | POA: Insufficient documentation

## 2023-10-09 DIAGNOSIS — M791 Myalgia, unspecified site: Secondary | ICD-10-CM | POA: Diagnosis present

## 2023-10-09 LAB — RESP PANEL BY RT-PCR (RSV, FLU A&B, COVID)  RVPGX2
Influenza A by PCR: POSITIVE — AB
Influenza B by PCR: NEGATIVE
Resp Syncytial Virus by PCR: NEGATIVE
SARS Coronavirus 2 by RT PCR: NEGATIVE

## 2023-10-09 LAB — CBC WITH DIFFERENTIAL/PLATELET
Abs Immature Granulocytes: 0.03 10*3/uL (ref 0.00–0.07)
Basophils Absolute: 0 10*3/uL (ref 0.0–0.1)
Basophils Relative: 0 %
Eosinophils Absolute: 0 10*3/uL (ref 0.0–0.5)
Eosinophils Relative: 0 %
HCT: 35.5 % — ABNORMAL LOW (ref 39.0–52.0)
Hemoglobin: 12 g/dL — ABNORMAL LOW (ref 13.0–17.0)
Immature Granulocytes: 0 %
Lymphocytes Relative: 10 %
Lymphs Abs: 0.7 10*3/uL (ref 0.7–4.0)
MCH: 30.2 pg (ref 26.0–34.0)
MCHC: 33.8 g/dL (ref 30.0–36.0)
MCV: 89.4 fL (ref 80.0–100.0)
Monocytes Absolute: 0.7 10*3/uL (ref 0.1–1.0)
Monocytes Relative: 10 %
Neutro Abs: 5.6 10*3/uL (ref 1.7–7.7)
Neutrophils Relative %: 80 %
Platelets: 215 10*3/uL (ref 150–400)
RBC: 3.97 MIL/uL — ABNORMAL LOW (ref 4.22–5.81)
RDW: 13.5 % (ref 11.5–15.5)
WBC: 7.1 10*3/uL (ref 4.0–10.5)
nRBC: 0 % (ref 0.0–0.2)

## 2023-10-09 LAB — COMPREHENSIVE METABOLIC PANEL
ALT: 27 U/L (ref 0–44)
AST: 38 U/L (ref 15–41)
Albumin: 3.9 g/dL (ref 3.5–5.0)
Alkaline Phosphatase: 51 U/L (ref 38–126)
Anion gap: 8 (ref 5–15)
BUN: 15 mg/dL (ref 6–20)
CO2: 28 mmol/L (ref 22–32)
Calcium: 8.9 mg/dL (ref 8.9–10.3)
Chloride: 99 mmol/L (ref 98–111)
Creatinine, Ser: 1.02 mg/dL (ref 0.61–1.24)
GFR, Estimated: >60 mL/min (ref >60)
Glucose, Bld: 91 mg/dL (ref 70–99)
Potassium: 3.5 mmol/L (ref 3.5–5.1)
Sodium: 135 mmol/L (ref 135–145)
Total Bilirubin: 0.9 mg/dL (ref 0.0–1.2)
Total Protein: 6.8 g/dL (ref 6.5–8.1)

## 2023-10-09 LAB — APTT: aPTT: 32 s (ref 24–36)

## 2023-10-09 LAB — PROTIME-INR
INR: 1.1 (ref 0.8–1.2)
Prothrombin Time: 14.5 s (ref 11.4–15.2)

## 2023-10-09 LAB — I-STAT CG4 LACTIC ACID, ED: Lactic Acid, Venous: 1.1 mmol/L (ref 0.5–1.9)

## 2023-10-09 MED ORDER — OSELTAMIVIR PHOSPHATE 75 MG PO CAPS: 75.0000 mg | ORAL_CAPSULE | Freq: Two times a day (BID) | ORAL | 0 refills | Status: DC

## 2023-10-09 MED ORDER — BENZONATATE 100 MG PO CAPS: 100.0000 mg | ORAL_CAPSULE | Freq: Two times a day (BID) | ORAL | 0 refills | Status: DC | PRN

## 2023-10-09 MED ORDER — OSELTAMIVIR PHOSPHATE 75 MG PO CAPS
75.0000 mg | ORAL_CAPSULE | Freq: Once | ORAL | Status: AC
  Administered 2023-10-09: 75 mg via ORAL
  Filled 2023-10-09: qty 1

## 2023-10-09 NOTE — ED Notes (Signed)
Patient yelled and screamed obscenities while having a conversation about patient exposing his arms for IV insertion and blood draw.

## 2023-10-09 NOTE — ED Provider Notes (Signed)
[facility]-EMERGENCY DEPT [facility] Emergency Department Provider Note MRN:  [id number]  Arrival date & time: 10/09/23     Chief Complaint   Generalized Body Aches   History of Present Illness   Trevor [name] is a 43 y.o. year-old male presents to the ED with chief complaint of generalized bodyaches.  He reports associated congestion, sore throat, and cough.  Onset was yesterday.  Patient states that he feels like he has the flu.  He has not tried taking anything for symptoms.    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    History provided by patient.   Review of Systems  Pertinent positive and negative review of systems noted in HPI.    Physical Exam   Vitals:   10/09/23 0253 10/09/23 0300  BP: 123/78 130/77  Pulse: 91 90  Resp: 16 17  Temp:    SpO2: 100% 100%    CONSTITUTIONAL: Nontoxic-appearing, NAD NEURO:  Alert and oriented x 3, CN 3-12 grossly intact EYES:  eyes equal and reactive ENT/NECK:  Supple, no stridor  CARDIO:  tachycardic, regular rhythm, appears well-perfused  PULM:  No respiratory distress, no increased WOB, lungs are clear GI/GU:  non-distended,  MSK/SPINE:  No gross deformities, no edema, moves all extremities  SKIN:  no rash, atraumatic   *Additional and/or pertinent findings included in MDM below  Diagnostic and Interventional Summary    EKG Interpretation Date/Time:    Ventricular Rate:    PR Interval:    QRS Duration:    QT Interval:    QTC Calculation:   R Axis:      Text Interpretation:         Labs Reviewed  RESP PANEL BY RT-PCR (RSV, FLU A&B, COVID)  RVPGX2 - Abnormal; Notable for the following components:      Result Value   Influenza A by PCR POSITIVE (*)    All other components within normal limits  CBC WITH DIFFERENTIAL/PLATELET - Abnormal; Notable for the following components:   RBC 3.97 (*)    Hemoglobin 12.0 (*)    HCT 35.5 (*)    All other components within normal limits   CULTURE, BLOOD (ROUTINE X 2)  CULTURE, BLOOD (ROUTINE X 2)  COMPREHENSIVE METABOLIC PANEL  PROTIME-INR  APTT  I-STAT CG4 LACTIC ACID, ED  I-STAT CG4 LACTIC ACID, ED    DG Chest 2 View  Final Result      Medications  oseltamivir (TAMIFLU) capsule 75 mg (has no administration in time range)     Procedures  /  Critical Care Procedures  ED Course and Medical Decision Making  I have reviewed the triage vital signs, the nursing notes, and pertinent available records from the EMR.  Social Determinants Affecting Complexity of Care: Patient has no clinically significant social determinants affecting this chief complaint..   ED Course: Clinical Course as of 10/09/23 0353  Sun Oct 09, 2023  0353 I-Stat Lactic Acid, ED Normal lactic [RB]  0353 Comprehensive metabolic panel No significant electrolyte abnormality [RB]  0353 CBC with Differential(!) No leukocytosis [RB]  0353 Resp panel by RT-PCR (RSV, Flu A&B, Covid) Anterior Nasal Swab(!) Influenza A positive [RB]    Clinical Course User Index [RB] Roxy Horseman, PA-C    Medical Decision Making Patient here with like symptoms.  Onset was yesterday.  While I have a high degree of suspicion for influenza, patient is also high risk given that he is an IV drug user and  has history of endocarditis.  Will check basic labs, imaging, and reassess.  O2 saturation in triage noted to be 87%, patient is in no respiratory distress, lung sounds are clear.  We rechecked this several times, and his O2 sat was 100% on room air.  I suspect error in triage.  Chest x-ray is negative for pneumonia.  Labs and workup is reassuring.  Influenza a came back positive.  Will treat with Tamiflu given patient's risk profile.  Will discharge home.  He is agreeable with plan.  Amount and/or Complexity of Data Reviewed Labs: ordered. Decision-making details documented in ED Course. Radiology: ordered.  Risk Prescription drug management.          Consultants: No consultations were needed in caring for this patient.   Treatment and Plan: Emergency department workup does not suggest an emergent condition requiring admission or immediate intervention beyond  what has been performed at this time. The patient is safe for discharge and has  been instructed to return immediately for worsening symptoms, change in  symptoms or any other concerns    Final Clinical Impressions(s) / ED Diagnoses     ICD-10-CM   1. Influenza A  J10.1       ED Discharge Orders          Ordered    oseltamivir (TAMIFLU) 75 MG capsule  Every 12 hours        10/09/23 0351    benzonatate (TESSALON) 100 MG capsule  2 times daily PRN        10/09/23 0351              Discharge Instructions Discussed with and Provided to Patient:   Discharge Instructions   None      Roxy Horseman, PA-C 10/09/23 0354    Mesner, Barbara Cower, MD 10/09/23 2257

## 2023-10-09 NOTE — ED Triage Notes (Signed)
The pt reports that  he is congested with generalized body aches he has not taken anything for this and he has had this since yesterday

## 2023-10-12 ENCOUNTER — Encounter (HOSPITAL_COMMUNITY): Payer: Self-pay

## 2023-10-12 ENCOUNTER — Ambulatory Visit (HOSPITAL_COMMUNITY)
Admission: EM | Admit: 2023-10-12 | Discharge: 2023-10-12 | Disposition: A | Discharge status: Home / Self Care | Payer: MEDICAID | Attending: Family Medicine | Admitting: Family Medicine

## 2023-10-12 DIAGNOSIS — F3160 Bipolar disorder, current episode mixed, unspecified: Secondary | ICD-10-CM

## 2023-10-12 DIAGNOSIS — F32A Depression, unspecified: Secondary | ICD-10-CM

## 2023-10-12 MED ORDER — HYDROXYZINE HCL 50 MG PO TABS: 50.0000 mg | ORAL_TABLET | ORAL | 1 refills | Status: DC | PRN

## 2023-10-12 MED ORDER — ESCITALOPRAM OXALATE 10 MG PO TABS: 10.0000 mg | ORAL_TABLET | Freq: Every day | ORAL | 1 refills | Status: DC

## 2023-10-12 MED ORDER — BUSPIRONE HCL 7.5 MG PO TABS: 7.5000 mg | ORAL_TABLET | Freq: Three times a day (TID) | ORAL | 1 refills | Status: DC
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
## 2023-10-12 NOTE — ED Triage Notes (Signed)
Patient is requesting a refill on Vistaril ?mg says he takes 3 tabs daily, Lexapro 10 mg daily, and Buspar ? Mg bid. Patient states he was prescribed these at [facility]. Patient states he has been off of his meds x 10 days and is feeling manic and hearing things.

## 2023-10-13 ENCOUNTER — Other Ambulatory Visit: Payer: Self-pay

## 2023-10-13 ENCOUNTER — Inpatient Hospital Stay (HOSPITAL_COMMUNITY)
Admission: EM | Admit: 2023-10-13 | Discharge: 2023-10-26 | DRG: 854 | Disposition: A | Discharge status: Home / Self Care | Payer: BLUE CROSS/BLUE SHIELD | Attending: Internal Medicine | Admitting: Internal Medicine

## 2023-10-13 ENCOUNTER — Encounter (HOSPITAL_COMMUNITY): Payer: Self-pay | Admitting: *Deleted

## 2023-10-13 ENCOUNTER — Emergency Department (HOSPITAL_COMMUNITY): Payer: BLUE CROSS/BLUE SHIELD

## 2023-10-13 DIAGNOSIS — Z8249 Family history of ischemic heart disease and other diseases of the circulatory system: Secondary | ICD-10-CM

## 2023-10-13 DIAGNOSIS — M7021 Olecranon bursitis, right elbow: Secondary | ICD-10-CM | POA: Diagnosis not present

## 2023-10-13 DIAGNOSIS — L02413 Cutaneous abscess of right upper limb: Secondary | ICD-10-CM | POA: Diagnosis present

## 2023-10-13 DIAGNOSIS — F319 Bipolar disorder, unspecified: Secondary | ICD-10-CM | POA: Diagnosis not present

## 2023-10-13 DIAGNOSIS — R45851 Suicidal ideations: Secondary | ICD-10-CM | POA: Diagnosis present

## 2023-10-13 DIAGNOSIS — F315 Bipolar disorder, current episode depressed, severe, with psychotic features: Secondary | ICD-10-CM | POA: Diagnosis present

## 2023-10-13 DIAGNOSIS — M7989 Other specified soft tissue disorders: Secondary | ICD-10-CM | POA: Diagnosis present

## 2023-10-13 DIAGNOSIS — E785 Hyperlipidemia, unspecified: Secondary | ICD-10-CM | POA: Diagnosis present

## 2023-10-13 DIAGNOSIS — F1520 Other stimulant dependence, uncomplicated: Secondary | ICD-10-CM | POA: Diagnosis present

## 2023-10-13 DIAGNOSIS — G8929 Other chronic pain: Secondary | ICD-10-CM | POA: Diagnosis present

## 2023-10-13 DIAGNOSIS — E871 Hypo-osmolality and hyponatremia: Secondary | ICD-10-CM | POA: Diagnosis present

## 2023-10-13 DIAGNOSIS — Z8619 Personal history of other infectious and parasitic diseases: Secondary | ICD-10-CM

## 2023-10-13 DIAGNOSIS — F1910 Other psychoactive substance abuse, uncomplicated: Secondary | ICD-10-CM | POA: Insufficient documentation

## 2023-10-13 DIAGNOSIS — E876 Hypokalemia: Secondary | ICD-10-CM | POA: Insufficient documentation

## 2023-10-13 DIAGNOSIS — Z23 Encounter for immunization: Secondary | ICD-10-CM | POA: Diagnosis not present

## 2023-10-13 DIAGNOSIS — M869 Osteomyelitis, unspecified: Secondary | ICD-10-CM | POA: Diagnosis not present

## 2023-10-13 DIAGNOSIS — J439 Emphysema, unspecified: Secondary | ICD-10-CM | POA: Diagnosis present

## 2023-10-13 DIAGNOSIS — R739 Hyperglycemia, unspecified: Secondary | ICD-10-CM | POA: Diagnosis present

## 2023-10-13 DIAGNOSIS — M65931 Unspecified synovitis and tenosynovitis, right forearm: Secondary | ICD-10-CM | POA: Diagnosis present

## 2023-10-13 DIAGNOSIS — F17210 Nicotine dependence, cigarettes, uncomplicated: Secondary | ICD-10-CM | POA: Diagnosis present

## 2023-10-13 DIAGNOSIS — A419 Sepsis, unspecified organism: Principal | ICD-10-CM | POA: Diagnosis present

## 2023-10-13 DIAGNOSIS — Z880 Allergy status to penicillin: Secondary | ICD-10-CM | POA: Diagnosis not present

## 2023-10-13 DIAGNOSIS — Z87898 Personal history of other specified conditions: Secondary | ICD-10-CM

## 2023-10-13 DIAGNOSIS — M71121 Other infective bursitis, right elbow: Secondary | ICD-10-CM | POA: Diagnosis not present

## 2023-10-13 DIAGNOSIS — M8618 Other acute osteomyelitis, other site: Secondary | ICD-10-CM | POA: Diagnosis present

## 2023-10-13 DIAGNOSIS — Z981 Arthrodesis status: Secondary | ICD-10-CM

## 2023-10-13 DIAGNOSIS — Z79899 Other long term (current) drug therapy: Secondary | ICD-10-CM

## 2023-10-13 DIAGNOSIS — Z79891 Long term (current) use of opiate analgesic: Secondary | ICD-10-CM

## 2023-10-13 DIAGNOSIS — Z8614 Personal history of Methicillin resistant Staphylococcus aureus infection: Secondary | ICD-10-CM | POA: Diagnosis not present

## 2023-10-13 DIAGNOSIS — F411 Generalized anxiety disorder: Secondary | ICD-10-CM | POA: Diagnosis present

## 2023-10-13 DIAGNOSIS — M86232 Subacute osteomyelitis, left radius and ulna: Secondary | ICD-10-CM | POA: Diagnosis not present

## 2023-10-13 DIAGNOSIS — R768 Other specified abnormal immunological findings in serum: Secondary | ICD-10-CM | POA: Diagnosis not present

## 2023-10-13 DIAGNOSIS — L03113 Cellulitis of right upper limb: Principal | ICD-10-CM | POA: Diagnosis present

## 2023-10-13 DIAGNOSIS — S50311A Abrasion of right elbow, initial encounter: Secondary | ICD-10-CM | POA: Diagnosis present

## 2023-10-13 DIAGNOSIS — I10 Essential (primary) hypertension: Secondary | ICD-10-CM | POA: Diagnosis present

## 2023-10-13 DIAGNOSIS — D649 Anemia, unspecified: Secondary | ICD-10-CM | POA: Insufficient documentation

## 2023-10-13 DIAGNOSIS — F419 Anxiety disorder, unspecified: Secondary | ICD-10-CM | POA: Diagnosis not present

## 2023-10-13 DIAGNOSIS — F1990 Other psychoactive substance use, unspecified, uncomplicated: Secondary | ICD-10-CM | POA: Diagnosis not present

## 2023-10-13 DIAGNOSIS — B954 Other streptococcus as the cause of diseases classified elsewhere: Secondary | ICD-10-CM | POA: Diagnosis present

## 2023-10-13 DIAGNOSIS — F1510 Other stimulant abuse, uncomplicated: Secondary | ICD-10-CM | POA: Diagnosis not present

## 2023-10-13 DIAGNOSIS — Z83438 Family history of other disorder of lipoprotein metabolism and other lipidemia: Secondary | ICD-10-CM

## 2023-10-13 HISTORY — DX: Other psychoactive substance abuse, uncomplicated: F19.10

## 2023-10-13 LAB — COMPREHENSIVE METABOLIC PANEL
ALT: 29 U/L (ref 0–44)
AST: 38 U/L (ref 15–41)
Albumin: 3.7 g/dL (ref 3.5–5.0)
Alkaline Phosphatase: 51 U/L (ref 38–126)
Anion gap: 9 (ref 5–15)
BUN: 21 mg/dL — ABNORMAL HIGH (ref 6–20)
CO2: 26 mmol/L (ref 22–32)
Calcium: 9 mg/dL (ref 8.9–10.3)
Chloride: 99 mmol/L (ref 98–111)
Creatinine, Ser: 0.51 mg/dL — ABNORMAL LOW (ref 0.61–1.24)
GFR, Estimated: >60 mL/min (ref >60)
Glucose, Bld: 110 mg/dL — ABNORMAL HIGH (ref 70–99)
Potassium: 3.9 mmol/L (ref 3.5–5.1)
Sodium: 134 mmol/L — ABNORMAL LOW (ref 135–145)
Total Bilirubin: 0.5 mg/dL (ref 0.0–1.2)
Total Protein: 7.2 g/dL (ref 6.5–8.1)

## 2023-10-13 LAB — CBC WITH DIFFERENTIAL/PLATELET
Abs Immature Granulocytes: 0.05 10*3/uL (ref 0.00–0.07)
Basophils Absolute: 0 10*3/uL (ref 0.0–0.1)
Basophils Relative: 0 %
Eosinophils Absolute: 0 10*3/uL (ref 0.0–0.5)
Eosinophils Relative: 0 %
HCT: 35.6 % — ABNORMAL LOW (ref 39.0–52.0)
Hemoglobin: 11.9 g/dL — ABNORMAL LOW (ref 13.0–17.0)
Immature Granulocytes: 1 %
Lymphocytes Relative: 9 %
Lymphs Abs: 0.9 10*3/uL (ref 0.7–4.0)
MCH: 29.8 pg (ref 26.0–34.0)
MCHC: 33.4 g/dL (ref 30.0–36.0)
MCV: 89 fL (ref 80.0–100.0)
Monocytes Absolute: 0.8 10*3/uL (ref 0.1–1.0)
Monocytes Relative: 8 %
Neutro Abs: 8.2 10*3/uL — ABNORMAL HIGH (ref 1.7–7.7)
Neutrophils Relative %: 82 %
Platelets: 245 10*3/uL (ref 150–400)
RBC: 4 MIL/uL — ABNORMAL LOW (ref 4.22–5.81)
RDW: 13.2 % (ref 11.5–15.5)
WBC: 9.9 10*3/uL (ref 4.0–10.5)
nRBC: 0 % (ref 0.0–0.2)

## 2023-10-13 MED ORDER — ACETAMINOPHEN 650 MG RE SUPP: 650.0000 mg | Freq: Four times a day (QID) | RECTAL | Status: DC | PRN

## 2023-10-13 MED ORDER — ESCITALOPRAM OXALATE 10 MG PO TABS
10.0000 mg | ORAL_TABLET | Freq: Every day | ORAL | Status: DC
Start: 2023-10-13 — End: 2023-10-26
  Administered 2023-10-14 – 2023-10-26 (×13): 10 mg via ORAL
  Filled 2023-10-13 (×14): qty 1

## 2023-10-13 MED ORDER — KETOROLAC TROMETHAMINE 60 MG/2ML IM SOLN
60.0000 mg | Freq: Once | INTRAMUSCULAR | Status: AC
  Administered 2023-10-13: 60 mg via INTRAMUSCULAR
  Filled 2023-10-13: qty 2

## 2023-10-13 MED ORDER — ACETAMINOPHEN 325 MG PO TABS
650.0000 mg | ORAL_TABLET | Freq: Four times a day (QID) | ORAL | Status: DC | PRN
  Administered 2023-10-15 – 2023-10-22 (×8): 650 mg via ORAL
  Filled 2023-10-13 (×10): qty 2

## 2023-10-13 MED ORDER — BUSPIRONE HCL 5 MG PO TABS
7.5000 mg | ORAL_TABLET | Freq: Three times a day (TID) | ORAL | Status: DC
  Administered 2023-10-14 – 2023-10-26 (×35): 7.5 mg via ORAL
  Filled 2023-10-13 (×37): qty 2

## 2023-10-13 MED ORDER — OSELTAMIVIR PHOSPHATE 75 MG PO CAPS
75.0000 mg | ORAL_CAPSULE | Freq: Two times a day (BID) | ORAL | Status: AC
  Administered 2023-10-13 – 2023-10-16 (×7): 75 mg via ORAL
  Filled 2023-10-13 (×8): qty 1

## 2023-10-13 MED ORDER — KETOROLAC TROMETHAMINE 30 MG/ML IJ SOLN
30.0000 mg | Freq: Three times a day (TID) | INTRAMUSCULAR | Status: AC
  Administered 2023-10-14 – 2023-10-15 (×6): 30 mg via INTRAVENOUS
  Filled 2023-10-13 (×6): qty 1

## 2023-10-13 MED ORDER — HYDROXYZINE HCL 25 MG PO TABS
50.0000 mg | ORAL_TABLET | Freq: Two times a day (BID) | ORAL | Status: DC | PRN
  Administered 2023-10-14 – 2023-10-18 (×6): 50 mg via ORAL
  Filled 2023-10-13 (×6): qty 2

## 2023-10-13 MED ORDER — KETOROLAC TROMETHAMINE 15 MG/ML IJ SOLN: 15.0000 mg | Freq: Once | INTRAMUSCULAR | Status: DC

## 2023-10-13 MED ORDER — CHLORHEXIDINE GLUCONATE CLOTH 2 % EX PADS
6.0000 | MEDICATED_PAD | Freq: Every day | CUTANEOUS | Status: DC
  Administered 2023-10-13 – 2023-10-26 (×14): 6 via TOPICAL

## 2023-10-13 MED ORDER — MORPHINE SULFATE (PF) 4 MG/ML IV SOLN
4.0000 mg | Freq: Once | INTRAVENOUS | Status: AC
  Administered 2023-10-13: 4 mg via INTRAVENOUS
  Filled 2023-10-13: qty 1

## 2023-10-13 MED ORDER — ONDANSETRON HCL 4 MG PO TABS: 4.0000 mg | ORAL_TABLET | Freq: Four times a day (QID) | ORAL | Status: DC | PRN

## 2023-10-13 MED ORDER — VANCOMYCIN HCL IN DEXTROSE 1-5 GM/200ML-% IV SOLN
1000.0000 mg | Freq: Three times a day (TID) | INTRAVENOUS | Status: DC
  Administered 2023-10-13 – 2023-10-14 (×3): 1000 mg via INTRAVENOUS
  Filled 2023-10-13 (×5): qty 200

## 2023-10-13 MED ORDER — TETANUS-DIPHTH-ACELL PERTUSSIS 5-2.5-18.5 LF-MCG/0.5 IM SUSY
0.5000 mL | PREFILLED_SYRINGE | Freq: Once | INTRAMUSCULAR | Status: AC
  Administered 2023-10-13: 0.5 mL via INTRAMUSCULAR
  Filled 2023-10-13: qty 0.5

## 2023-10-13 MED ORDER — SODIUM CHLORIDE 0.9 % IV SOLN
2.0000 g | Freq: Once | INTRAVENOUS | Status: AC
  Administered 2023-10-13: 2 g via INTRAVENOUS
  Filled 2023-10-13: qty 12.5

## 2023-10-13 MED ORDER — ENOXAPARIN SODIUM 40 MG/0.4ML IJ SOSY
40.0000 mg | PREFILLED_SYRINGE | INTRAMUSCULAR | Status: DC
  Administered 2023-10-13 – 2023-10-25 (×12): 40 mg via SUBCUTANEOUS
  Filled 2023-10-13 (×13): qty 0.4

## 2023-10-13 MED ORDER — NICOTINE 14 MG/24HR TD PT24
14.0000 mg | MEDICATED_PATCH | Freq: Every day | TRANSDERMAL | Status: DC
  Administered 2023-10-13 – 2023-10-26 (×14): 14 mg via TRANSDERMAL
  Filled 2023-10-13 (×14): qty 1

## 2023-10-13 MED ORDER — SODIUM CHLORIDE 0.9 % IV SOLN
2.0000 g | Freq: Three times a day (TID) | INTRAVENOUS | Status: DC
  Administered 2023-10-13: 2 g via INTRAVENOUS
  Filled 2023-10-13: qty 12.5

## 2023-10-13 MED ORDER — ONDANSETRON HCL 4 MG/2ML IJ SOLN: 4.0000 mg | Freq: Four times a day (QID) | INTRAMUSCULAR | Status: DC | PRN

## 2023-10-13 MED ORDER — TRAMADOL HCL 50 MG PO TABS
50.0000 mg | ORAL_TABLET | Freq: Four times a day (QID) | ORAL | Status: DC | PRN
  Administered 2023-10-13 – 2023-10-19 (×8): 50 mg via ORAL
  Filled 2023-10-13 (×8): qty 1

## 2023-10-13 MED ORDER — KETOROLAC TROMETHAMINE 30 MG/ML IJ SOLN: 30.0000 mg | Freq: Three times a day (TID) | INTRAMUSCULAR | Status: DC

## 2023-10-13 MED ORDER — BUSPIRONE HCL 10 MG PO TABS
10.0000 mg | ORAL_TABLET | Freq: Once | ORAL | Status: AC
  Administered 2023-10-13: 10 mg via ORAL
  Filled 2023-10-13: qty 1

## 2023-10-13 MED ORDER — SODIUM CHLORIDE 0.9% FLUSH: 10.0000 mL | INTRAVENOUS | Status: DC | PRN

## 2023-10-13 MED ORDER — VANCOMYCIN HCL IN DEXTROSE 1-5 GM/200ML-% IV SOLN
1000.0000 mg | Freq: Once | INTRAVENOUS | Status: AC
  Administered 2023-10-13: 1000 mg via INTRAVENOUS
  Filled 2023-10-13: qty 200

## 2023-10-13 MED ORDER — SODIUM CHLORIDE 0.9% FLUSH
10.0000 mL | Freq: Two times a day (BID) | INTRAVENOUS | Status: DC
  Administered 2023-10-14 – 2023-10-24 (×8): 10 mL

## 2023-10-13 MED ORDER — SODIUM CHLORIDE 0.9 % IV SOLN
2.0000 g | Freq: Three times a day (TID) | INTRAVENOUS | Status: DC
  Administered 2023-10-14 – 2023-10-15 (×5): 2 g via INTRAVENOUS
  Filled 2023-10-13 (×5): qty 12.5

## 2023-10-13 NOTE — Plan of Care (Signed)

## 2023-10-13 NOTE — Progress Notes (Addendum)
1255- This RN to bedside to assess for PIV access. IV placed in ED resulting in large infiltrate from pt's left mid forearm to upper arm. Assessed left upper arm with [place] and no suitable vessels found. Pt restricted to LUE only due to infection in RUE.  Primary RN and MD notified.  Consider alternate access (CVC) until infiltrate resolves.

## 2023-10-13 NOTE — Progress Notes (Signed)
Peripherally Inserted Central Catheter Placement  The IV Nurse has discussed with the patient and/or persons authorized to consent for the patient, the purpose of this procedure and the potential benefits and risks involved with this procedure.  The benefits include less needle sticks, lab draws from the catheter, and the patient may be discharged home with the catheter. Risks include, but not limited to, infection, bleeding, blood clot (thrombus formation), and puncture of an artery; nerve damage and irregular heartbeat and possibility to perform a PICC exchange if needed/ordered by physician.  Alternatives to this procedure were also discussed.  Bard Power PICC patient education guide, fact sheet on infection prevention and patient information card has been provided to patient /or left at bedside.  PICC placed by Elenore Paddy, RN  PICC Placement Documentation  PICC Single Lumen 10/13/23 Left Brachial 46 cm 0 cm (Active)  Indication for Insertion or Continuance of Line Limited venous access - need for IV therapy >5 days (PICC only) 10/13/23 1549  Exposed Catheter (cm) 0 cm 10/13/23 1549  Site Assessment Clean, Dry, Intact 10/13/23 1549  Line Status Flushed;Saline locked;Blood return noted 10/13/23 1549  Dressing Type Transparent;Securing device 10/13/23 1549  Dressing Status Antimicrobial disc/dressing in place;Clean, Dry, Intact 10/13/23 1549  Line Care Connections checked and tightened 10/13/23 1549  Line Adjustment (NICU/IV Team Only) No 10/13/23 1549  Dressing Intervention New dressing;Adhesive placed at insertion site (IV team only) 10/13/23 1549  Dressing Change Due 10/20/23 10/13/23 1549       Trevor [name], [name] 10/13/2023, 3:50 PM
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
## 2023-10-13 NOTE — Progress Notes (Signed)
Pt brought in medications from home. Medications counted and given to pharmacy for storage. Pt had a packet of cigarette which is stored in the pt's chart. Pt education provided about no smoking in the room and hazard related to it. Pt verbalized understanding and agreed to put them in storage.
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
## 2023-10-13 NOTE — Progress Notes (Signed)
Pharmacy Antibiotic Note  Trevor [name] is a 43 y.o. male admitted on 10/13/2023 with cellulitis.  Pharmacy has been consulted for cefepime and vancomycin dosing.  IV antibiotics initiated in ED: 1/23 0859 Cefepime 2 g IV x 1 1/23 0921 Vancomycin 1000 mg IV x 1  Plan: Continue cefepime 2 g IV every 8 hours Continue vancomycin 1000 mg IV every 8 hours (Goal AUC 400-550, eAUC 473.7, SCr used: rounded up to 0.8, est ht: 68 in, Vd: 0.72) Monitor clinical progress, renal function, vancomycin levels as indicated F/U C&S, abx deescalation / LOT   Weight: 87.1 kg (192 lb)  Temp (24hrs), Avg:99 F (37.2 C), Min:98.7 F (37.1 C), Max:99.3 F (37.4 C)  Recent Labs  Lab 10/13/23 0820  WBC 9.9  CREATININE 0.51*    CrCl cannot be calculated (Unknown ideal weight.).    Allergies  Allergen Reactions   Augmentin [Amoxicillin-Pot Clavulanate] Hives   Penicillins     Antimicrobials this admission: 1/23 cefepime >>  1/23 vancomycin >>   Microbiology results: 1/23 BCx: pending   Thank you for allowing pharmacy to be a part of this patient's care.  Selinda Eon, PharmD, BCPS Clinical Pharmacist [place] 10/13/2023 12:12 PM

## 2023-10-14 DIAGNOSIS — E876 Hypokalemia: Secondary | ICD-10-CM | POA: Insufficient documentation

## 2023-10-14 DIAGNOSIS — L03113 Cellulitis of right upper limb: Secondary | ICD-10-CM | POA: Diagnosis not present

## 2023-10-14 DIAGNOSIS — D649 Anemia, unspecified: Secondary | ICD-10-CM | POA: Insufficient documentation

## 2023-10-14 DIAGNOSIS — F1910 Other psychoactive substance abuse, uncomplicated: Secondary | ICD-10-CM | POA: Insufficient documentation

## 2023-10-14 DIAGNOSIS — F319 Bipolar disorder, unspecified: Secondary | ICD-10-CM | POA: Insufficient documentation

## 2023-10-14 LAB — CBC
HCT: 36.5 % — ABNORMAL LOW (ref 39.0–52.0)
Hemoglobin: 11.9 g/dL — ABNORMAL LOW (ref 13.0–17.0)
MCH: 29.4 pg (ref 26.0–34.0)
MCHC: 32.6 g/dL (ref 30.0–36.0)
MCV: 90.1 fL (ref 80.0–100.0)
Platelets: 229 10*3/uL (ref 150–400)
RBC: 4.05 MIL/uL — ABNORMAL LOW (ref 4.22–5.81)
RDW: 13.4 % (ref 11.5–15.5)
WBC: 9.3 10*3/uL (ref 4.0–10.5)
nRBC: 0 % (ref 0.0–0.2)

## 2023-10-14 LAB — COMPREHENSIVE METABOLIC PANEL
ALT: 25 U/L (ref 0–44)
AST: 27 U/L (ref 15–41)
Albumin: 3.1 g/dL — ABNORMAL LOW (ref 3.5–5.0)
Alkaline Phosphatase: 48 U/L (ref 38–126)
Anion gap: 10 (ref 5–15)
BUN: 20 mg/dL (ref 6–20)
CO2: 26 mmol/L (ref 22–32)
Calcium: 8.6 mg/dL — ABNORMAL LOW (ref 8.9–10.3)
Chloride: 98 mmol/L (ref 98–111)
Creatinine, Ser: 0.82 mg/dL (ref 0.61–1.24)
GFR, Estimated: >60 mL/min (ref >60)
Glucose, Bld: 96 mg/dL (ref 70–99)
Potassium: 3.4 mmol/L — ABNORMAL LOW (ref 3.5–5.1)
Sodium: 134 mmol/L — ABNORMAL LOW (ref 135–145)
Total Bilirubin: 0.4 mg/dL (ref 0.0–1.2)
Total Protein: 6.6 g/dL (ref 6.5–8.1)

## 2023-10-14 LAB — CULTURE, BLOOD (ROUTINE X 2)
Culture: NO GROWTH
Culture: NO GROWTH
Special Requests: ADEQUATE
Special Requests: ADEQUATE

## 2023-10-14 LAB — HIV ANTIBODY (ROUTINE TESTING W REFLEX): HIV Screen 4th Generation wRfx: NONREACTIVE

## 2023-10-14 LAB — C-REACTIVE PROTEIN: CRP: 14.7 mg/dL — ABNORMAL HIGH (ref <1.0)

## 2023-10-14 LAB — SEDIMENTATION RATE: Sed Rate: 45 mm/h — ABNORMAL HIGH (ref 0–16)

## 2023-10-14 MED ORDER — QUETIAPINE FUMARATE 50 MG PO TABS
200.0000 mg | ORAL_TABLET | Freq: Every day | ORAL | Status: DC
  Filled 2023-10-14 (×4): qty 4

## 2023-10-14 MED ORDER — VANCOMYCIN HCL IN DEXTROSE 1-5 GM/200ML-% IV SOLN
1000.0000 mg | Freq: Three times a day (TID) | INTRAVENOUS | Status: DC
  Administered 2023-10-14 – 2023-10-18 (×12): 1000 mg via INTRAVENOUS
  Filled 2023-10-14 (×15): qty 200

## 2023-10-14 NOTE — Assessment & Plan Note (Signed)
There is redness and puffiness of the forearm and bicep but no redness induration in the AC fossa and nothing that feels at all like fluctuance.  Nothing I see appears to require debridement or drainage. - Continue vancomycin and cefepime - Follow blood cultures

## 2023-10-14 NOTE — Assessment & Plan Note (Signed)
-  Supp K

## 2023-10-14 NOTE — Progress Notes (Addendum)
Orthopedic Surgery Note  Consult received for retained foreign body in the antecubital fossa in a patient with history of intravenous drug use. There does appear to be a foreign body in the antecubital fossa. He is afebrile and hemodynamically stable. Would treat with antibiotics. I will come by this afternoon to see the patient. Full consult note will follow.   Update: Dr. Maryfrances Bunnell reached out and feels that this is cellulitis. Will treat with antibiotics and hold off on orthopedic consult at this time. I told him I would be available if he or the medicine team needs an orthopedic consultation.    London Sheer, MD Orthopedic Surgeon

## 2023-10-14 NOTE — Assessment & Plan Note (Signed)
-   Continue BUspar, Lexapro - Resume home quetiapine

## 2023-10-14 NOTE — Assessment & Plan Note (Signed)
Hgb stable overnight, no bleeding observed

## 2023-10-14 NOTE — Hospital Course (Addendum)
Brief Narrative:  43 year old with history of bipolar 1, IV drug abuse, history of MSSA bacteremia 2 months ago negative TEE, depression, recurrent SI discharged from behavioral health 2 weeks prior to admission now presents with right elbow swelling and pain.  Upon admission noted to have cellulitis and abscess.  Underwent I&D by [facility] on 1/27 and started on IV antibiotics per ID.  At this time given his prior history of drug abuse and psych illness, safe disposition may be an issue.   Assessment & Plan:  Principal Problem:   Septic olecranon bursitis of right elbow Active Problems:   Cellulitis of right elbow   Hypokalemia   Normocytic anemia   Polysubstance abuse (HCC)   History of intravenous drug abuse   Osteomyelitis of left arm (HCC)   Methamphetamine dependence (HCC)      Sepsis secondary to cellulitis and abscess of the right elbow, POA Olecranon osteomyelitis -Status post right olecranon bursectomy and I&D of deep abscess 1/27 by Dr. Orlan Leavens.  Culture data has been reviewed.  Cultures are growing group G strep. - ID to identify long-term antibiotics.  Essentially patient will need about 6 weeks  Normocytic anemia - Hemoglobin has remained stable   Bipolar 1 disorder (HCC) Management per psychiatry.  Currently on Zyprexa twice daily, BuSpar, Lexapro, doxepin 10 mg at bedtime.  Defer further workup to their service.  No inpatient psych  needs identified per their service    Hypokalemia - Supplemented and resolved   Polysubstance abuse - Avoid opiates/narcotics as appropriate -if truly intractable pain can initiate buprenorphine (resting comfortably no acute distress) - Acetaminophen, naproxen, Robaxin  Periodic lab work.  Overall stable  DVT prophylaxis:Lovenox    Code Status: Full Code Family Communication:   Status is: Inpatient Remains inpatient appropriate because: Cont hosp stay for management of psych issues and Abx needed.      Subjective: Patient seen and examined at bedside.  Continues to insist on starting anxiety medication as hydroxyzine is not sufficient for him.  Does not have any other complaints.   Examination:  General exam: Appears calm and comfortable  Respiratory system: Clear to auscultation. Respiratory effort normal. Cardiovascular system: S1 & S2 heard, RRR. No JVD, murmurs, rubs, gallops or clicks. No pedal edema. Gastrointestinal system: Abdomen is nondistended, soft and nontender. No organomegaly or masses felt. Normal bowel sounds heard. Central nervous system: Alert and oriented. No focal neurological deficits. Extremities: Symmetric 5 x 5 power. Skin: No rashes, lesions or ulcers Psychiatry: Judgement and insight appear normal. Mood & affect appropriate.

## 2023-10-14 NOTE — Plan of Care (Signed)
  Problem: Clinical Measurements: Goal: Ability to maintain clinical measurements within normal limits will improve Outcome: Progressing   Problem: Pain Managment: Goal: General experience of comfort will improve and/or be controlled Outcome: Progressing   Problem: Safety: Goal: Ability to remain free from injury will improve Outcome: Progressing   Problem: Clinical Measurements: Goal: Ability to avoid or minimize complications of infection will improve Outcome: Progressing

## 2023-10-14 NOTE — Progress Notes (Signed)
  Progress Note   Patient: Trevor [name] NFA:[id number] DOB: [birth year] DOA: 10/13/2023     1 DOS: the patient was seen and examined on 10/14/2023       Brief hospital course: 43 y.o. M with bipolary 1, IVDU, hx MSSA bacteremia 2 months ago in Nov 2024, negative TEE at that time, depression and recurrent SI, just discharged from [facility] 2 weeks PTA, who presented with right elbow swelling, redness and pain.     Assessment and Plan: * Cellulitis of right elbow There is redness and puffiness of the forearm and bicep but no redness induration in the AC fossa and nothing that feels at all like fluctuance.  Nothing I see appears to require debridement or drainage. - Continue vancomycin and cefepime - Follow blood cultures - Elevate limb   Polysubstance abuse (HCC) - TOC consult  Normocytic anemia Hgb stable overnight, no bleeding observed  Bipolar 1 disorder (HCC) - Continue BUspar, Lexapro - Resume home quetiapine  Hypokalemia - Supp K          Subjective: Patient is feeling okay, still has pain and redness of the right elbow, essentially no change from yesterday.  No drainage or opening wounds.  No fever.  No confusion.  No symptoms of withdrawal.     Physical Exam: BP 132/65 (BP Location: Right Leg)   Pulse 86   Temp 99.3 F (37.4 C)   Resp 17   Ht 6' (1.829 m)   Wt 73.9 kg   SpO2 99%   BMI 22.10 kg/m   Thin adult male, hygiene poor, edentulous, but oriented and responsive to questions RRR, no murmurs, no peripheral edema Respiratory rate normal, lungs clear without rales or wheezes Abdomen soft no tenderness palpation or guarding Attention normal, oriented to person, place, time, upper extremity strength normal, speech fluent, face symmetric The right elbow has a excoriation at the tip, but nothing like olecranon bursitis, no fluctuance at all, the [facility] fossa looks completely normal, soft and nontender    Data Reviewed: Discussed with  orthopedics Basic metabolic panel shows mild hypokalemia, normal creatinine CBC shows normal white blood cell count, mild anemia    Family Communication:     Disposition: Status is: Inpatient Still considerable swelling, will continue IV antibiotics, monitor        Author: Alberteen Sam, MD 10/14/2023 11:04 AM  For on call review [email or web address].

## 2023-10-14 NOTE — Plan of Care (Signed)
  Problem: Activity: Goal: Risk for activity intolerance will decrease Outcome: Progressing   Problem: Nutrition: Goal: Adequate nutrition will be maintained Outcome: Progressing   Problem: Coping: Goal: Level of anxiety will decrease Outcome: Progressing   Problem: Pain Managment: Goal: General experience of comfort will improve and/or be controlled Outcome: Progressing   Problem: Safety: Goal: Ability to remain free from injury will improve Outcome: Progressing   Problem: Skin Integrity: Goal: Risk for impaired skin integrity will decrease Outcome: Progressing   Problem: Skin Integrity: Goal: Skin integrity will improve Outcome: Progressing

## 2023-10-14 NOTE — TOC Initial Note (Signed)
Transition of Care [facility]) - Initial/Assessment Note    Patient Details  Name: Trevor [name] MRN: [id number] Date of Birth: Feb 07, 1981  [facility] CM/SW Contact:    Howell Rucks, RN Phone Number: 10/14/2023, 11:32 AM  Clinical Narrative:   Met with pt at bedside to introduce role of TOC/NCM and review for dc planning, pt reports he does not have an established PCP, agreeable to NCM schedule an appointment with one of [facility] outpatient clinics, pt reports he currently resides with a friend, reports he prefers not to return but his alternative is a shelter and he does not want to go to a shelter, reports he will need assistance with transportation at discharge. [facility] food , housing, transportation, utilities), resources for all added to AVS.   -11:35am Appt scheduled at [facility], Friday, October 21, 2023 at 2:50pm, added to AVS. TOC will continue to follow.                 Expected Discharge Plan: Home/Self Care Barriers to Discharge: Continued Medical Work up   Patient Goals and CMS Choice Patient states their goals for this hospitalization and ongoing recovery are:: return to friends home          Expected Discharge Plan and Services       Living arrangements for the past 2 months: Single Family Home                                      Prior Living Arrangements/Services Living arrangements for the past 2 months: Single Family Home Lives with:: Friends Patient language and need for interpreter reviewed:: Yes Do you feel safe going back to the place where you live?: Yes      Need for Family Participation in Patient Care: Yes (Comment) Care giver support system in place?: Yes (comment)   Criminal Activity/Legal Involvement Pertinent to Current Situation/Hospitalization: No - Comment as needed  Activities of Daily Living   ADL Screening (condition at time of admission) Independently performs  ADLs?: Yes (appropriate for developmental age) Is the patient deaf or have difficulty hearing?: No Does the patient have difficulty seeing, even when wearing glasses/contacts?: No Does the patient have difficulty concentrating, remembering, or making decisions?: No  Permission Sought/Granted                  Emotional Assessment Appearance:: Appears older than stated age Attitude/Demeanor/Rapport: Gracious   Orientation: : Oriented to Self, Oriented to Place, Oriented to  Time, Oriented to Situation Alcohol / Substance Use: Illicit Drugs Psych Involvement: No (comment)  Admission diagnosis:  Cellulitis of right elbow [L03.113] History of intravenous drug abuse [Z87.898] Patient Active Problem List   Diagnosis Date Noted   Hypokalemia 10/14/2023   Bipolar 1 disorder (HCC) 10/14/2023   Normocytic anemia 10/14/2023   Polysubstance abuse (HCC) 10/14/2023   Cellulitis of right elbow 10/13/2023   PCP:  Patient, No Pcp Per Pharmacy:   [facility] [address], [place] - [address] [place] [address] Phone: [phone] Fax: [phone]  [id number] - [place], [place] - [address] [facility] [address] [place] [address] Phone: [phone] Fax: [phone]     Social Drivers of Health (SDOH) Social History: SDOH Screenings   Food Insecurity: Food Insecurity Present (10/13/2023)  Housing: High Risk (  10/13/2023)  Transportation Needs: Unmet Transportation Needs (10/13/2023)  Utilities: At Risk (10/13/2023)  Social Connections: Socially Isolated (10/13/2023)  Tobacco Use: Medium Risk (10/13/2023)   SDOH Interventions:     Readmission Risk Interventions    10/14/2023   11:29 AM  Readmission Risk Prevention Plan  Post Dischage Appt Complete  Medication Screening Complete  Transportation Screening Complete

## 2023-10-14 NOTE — Progress Notes (Signed)
Resources for [employer], Education officer, museum added to AVS.

## 2023-10-14 NOTE — Assessment & Plan Note (Signed)
TOC consult ?

## 2023-10-15 DIAGNOSIS — L03113 Cellulitis of right upper limb: Secondary | ICD-10-CM | POA: Diagnosis not present

## 2023-10-15 LAB — CREATININE, SERUM
Creatinine, Ser: 0.64 mg/dL (ref 0.61–1.24)
GFR, Estimated: >60 mL/min (ref >60)

## 2023-10-15 MED ORDER — MELATONIN 5 MG PO TABS
10.0000 mg | ORAL_TABLET | Freq: Once | ORAL | Status: AC
  Administered 2023-10-15: 10 mg via ORAL
  Filled 2023-10-15: qty 2

## 2023-10-15 NOTE — Plan of Care (Signed)
Problem: Education: Goal: Knowledge of General Education information will improve Description: Including pain rating scale, medication(s)/side effects and non-pharmacologic comfort measures Outcome: Progressing   Problem: Health Behavior/Discharge Planning: Goal: Ability to manage health-related needs will improve Outcome: Progressing   Problem: Clinical Measurements: Goal: Will remain free from infection Outcome: Progressing

## 2023-10-15 NOTE — Plan of Care (Signed)
  Problem: Clinical Measurements: Goal: Ability to avoid or minimize complications of infection will improve Outcome: Progressing   Problem: Safety: Goal: Ability to remain free from injury will improve Outcome: Progressing   Problem: Pain Managment: Goal: General experience of comfort will improve and/or be controlled Outcome: Progressing   Problem: Coping: Goal: Level of anxiety will decrease Outcome: Progressing

## 2023-10-15 NOTE — Progress Notes (Signed)
  Progress Note   Patient: Trevor [name] [id number] DOB: [birth year] DOA: 10/13/2023     2 DOS: the patient was seen and examined on 10/15/2023 at 11:05 AM      Brief hospital course: 43 y.o. M with bipolary 1, IVDU, hx MSSA bacteremia 2 months ago in Nov 2024, negative TEE at that time, depression and recurrent SI, just discharged from [facility] 2 weeks PTA, who presented with right elbow swelling, redness and pain.     Assessment and Plan: * Cellulitis of right elbow No improvement was still very swollen.  One spot at the elbow is now draining yellowish pus.  Sent for culture.  I cannot feel any other fluctuant areas. - Continue antibiotics, narrowed to vancomycin - Obtain abscess culture     Bipolar 1 disorder (HCC) -Continue BuSpar, Lexapro, quetiapine             Subjective: Patient said no improvement.  He said no fever.  No nursing concerns.  Arm still red and swollen, no change     Physical Exam: BP (!) 182/79 (BP Location: Left Leg)   Pulse 76   Temp 97.8 F (36.6 C)   Resp 17   Ht 6' (1.829 m)   Wt 73.9 kg   SpO2 100%   BMI 22.10 kg/m   Thin adult male, lying in bed, interactive and appropriate RRR, no murmurs, no peripheral edema Respiratory rate normal, lungs clear without rales or wheezes noted abdomen soft no tenderness palpation or guarding Right arm still red, very puffy and swollen, I do not necessarily feel any fluctuant areas, although there is an open area now right at the elbow, which is draining yellowish pus    Data Reviewed: Sed rate and CRP slightly up Blood cultures negative Creatinine normal  Family Communication: Present    Disposition: Status is: Inpatient Recommend ongoing IV antibiotics        Author: Alberteen Sam, MD 10/15/2023 5:07 PM  For on call review [email or web address].

## 2023-10-16 ENCOUNTER — Inpatient Hospital Stay (HOSPITAL_COMMUNITY): Payer: BLUE CROSS/BLUE SHIELD

## 2023-10-16 DIAGNOSIS — L03113 Cellulitis of right upper limb: Secondary | ICD-10-CM | POA: Diagnosis not present

## 2023-10-16 MED ORDER — MELATONIN 5 MG PO TABS
10.0000 mg | ORAL_TABLET | Freq: Once | ORAL | Status: AC
  Administered 2023-10-16: 10 mg via ORAL
  Filled 2023-10-16: qty 2

## 2023-10-16 MED ORDER — DIAZEPAM 5 MG PO TABS
5.0000 mg | ORAL_TABLET | Freq: Once | ORAL | Status: AC | PRN
  Administered 2023-10-16: 5 mg via ORAL
  Filled 2023-10-16: qty 1

## 2023-10-16 NOTE — Progress Notes (Signed)
Orthopedic Tech Progress Note Patient Details:  Trevor [name] 02-Oct-1980 [id number]  Ortho Devices Type of Ortho Device: Long arm splint Ortho Device/Splint Location: RUE/ Cellulitis on RUE Ortho Device/Splint Interventions: Ordered, Application   Post Interventions Patient Tolerated: Well  [name] 10/16/2023, 1:59 PM

## 2023-10-16 NOTE — Progress Notes (Signed)
  Progress Note   Patient: Trevor [name] [id number]:[id number] DOB: 03/08/1981 DOA: 10/13/2023     3 DOS: the patient was seen and examined on 10/16/2023        Brief hospital course: 43 y.o. M with bipolary 1, IVDU, hx MSSA bacteremia 2 months ago in Nov 2024, negative TEE at that time, depression and recurrent SI, just discharged from [facility] 2 weeks PTA, who presented with right elbow swelling, redness and pain.     Assessment and Plan: * Cellulitis of right elbow Swelling at the elbow itself is resolved.  Swelling of the forearm and hand worse today, no overall improvement despite 48 hrs antibiotics.    There are stilltwo small scabs at the elbow.  The smaller one drained yesterday, was sent for culture, no results yet, no longer draining.  The inner forearm fells fluctuant.  - Continue vancomycin - Consult Ortho, appreciate cares - Repeat X-ray forearm per ortho   Polysubstance abuse (HCC) Patient denies IVDU - TOC consult  Normocytic anemia Hgb stable  Bipolar 1 disorder (HCC) - Continue Buspar, Lexapro, quetiapine  Hypokalemia Resolved          Subjective: Still hurts, still swollen.  The swelling in the forearm and hand appears to coalesce and more fluctuant than on amidssion.  No more draining today.     Physical Exam: BP (!) 146/57 (BP Location: Right Leg)   Pulse 62   Temp 98 F (36.7 C) (Oral)   Resp 16   Ht 6' (1.829 m)   Wt 73.9 kg   SpO2 100%   BMI 22.10 kg/m   Adult male, lying in bed, interactive and appropriate RRR no mumrurs, no LE edema Respiraotyr ratre normal, lungs clear without rales or wheezes Abdomen soft, no TTP Attention normal, affect normal See pictures of arm for current images from today.   Data Reviewed: Discussed with Ortho, Dr. Shon Baton CRP 14 Blood cultures NG  Family Communication: None    Disposition: Status is: Inpatient         Author: Alberteen Sam, MD 10/16/2023 11:35 AM  For on call  review [email or web address].

## 2023-10-16 NOTE — Plan of Care (Signed)
  Problem: Clinical Measurements: Goal: Diagnostic test results will improve Outcome: Progressing   Problem: Skin Integrity: Goal: Skin integrity will improve Outcome: Progressing

## 2023-10-16 NOTE — Progress Notes (Addendum)
Pharmacy Antibiotic Note  Trevor [name] is a 43 y.o. male admitted on 10/13/2023 with cellulitis.  Pharmacy has been consulted for vancomycin dosing. 10/16/2023 Cellulitis R elbow & forearm in IVDU Swelling of R elbow resolved> pus from R elbow sent for culture 1/25 Swelling forearm/hand worse today, inner forearm feels fluctuant per TRH note Day #4 IV Vancomycin. Cefepime stopped 1/25.  Remains afebrile with WBC WNL Ortho to consult  Plan: Continue vancomycin 1000 mg IV every 8 hours (Goal AUC 400-550, eAUC 518.4, Css min 15.1, TBW < IBW, SCr rounded up to 0.8, Hg 72 inches, Wt 73.9, Vd: 0.72) Monitor clinical progress, renal function, vancomycin levels as indicated F/U C&S, abx deescalation / LOT SCr ordered for 1/27 am   Height: 6' (182.9 cm) Weight: 73.9 kg (162 lb 14.7 oz) IBW/kg (Calculated) : 77.6  Temp (24hrs), Avg:98.2 F (36.8 C), Min:97.8 F (36.6 C), Max:98.7 F (37.1 C)  Recent Labs  Lab 10/13/23 0820 10/14/23 0003 10/15/23 0010  WBC 9.9 9.3  --   CREATININE 0.51* 0.82 0.64    Estimated Creatinine Clearance: 125.7 mL/min (by C-G formula based on SCr of 0.64 mg/dL).    Allergies  Allergen Reactions   Augmentin [Amoxicillin-Pot Clavulanate] Hives   Penicillins    Antimicrobials this admission: 1/23 cefepime >> 1/25 1/23 vancomycin >>   Completing Tamiflu (started 1/22 PTA)>> (1/27) Microbiology results: 1/23 BCx: ngtd 1/25 abscess R arm/elbow: rare GPC on gram stain  Thank you for allowing pharmacy to be a part of this patient's care.  Herby Abraham, Pharm.D Use secure chat for questions 10/16/2023 11:40 AM
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
## 2023-10-16 NOTE — Progress Notes (Signed)
Marland Kitchen

## 2023-10-17 ENCOUNTER — Encounter (HOSPITAL_COMMUNITY)
Admission: EM | Disposition: A | Discharge status: Home / Self Care | Payer: Self-pay | Source: Home / Self Care | Attending: Family Medicine

## 2023-10-17 ENCOUNTER — Encounter (HOSPITAL_COMMUNITY): Payer: Self-pay | Admitting: Certified Registered Nurse Anesthetist

## 2023-10-17 ENCOUNTER — Encounter (HOSPITAL_COMMUNITY): Payer: Self-pay | Admitting: Orthopedic Surgery

## 2023-10-17 ENCOUNTER — Inpatient Hospital Stay (HOSPITAL_COMMUNITY): Payer: BLUE CROSS/BLUE SHIELD | Admitting: Anesthesiology

## 2023-10-17 ENCOUNTER — Other Ambulatory Visit: Payer: Self-pay

## 2023-10-17 DIAGNOSIS — L03113 Cellulitis of right upper limb: Secondary | ICD-10-CM | POA: Diagnosis not present

## 2023-10-17 HISTORY — PX: IRRIGATION AND DEBRIDEMENT ELBOW: SHX6886

## 2023-10-17 LAB — SURGICAL PCR SCREEN
MRSA, PCR: NEGATIVE
Staphylococcus aureus: NEGATIVE

## 2023-10-17 LAB — CBC
HCT: 34.8 % — ABNORMAL LOW (ref 39.0–52.0)
Hemoglobin: 11.4 g/dL — ABNORMAL LOW (ref 13.0–17.0)
MCH: 29.6 pg (ref 26.0–34.0)
MCHC: 32.8 g/dL (ref 30.0–36.0)
MCV: 90.4 fL (ref 80.0–100.0)
Platelets: 314 10*3/uL (ref 150–400)
RBC: 3.85 MIL/uL — ABNORMAL LOW (ref 4.22–5.81)
RDW: 13.8 % (ref 11.5–15.5)
WBC: 5.7 10*3/uL (ref 4.0–10.5)
nRBC: 0 % (ref 0.0–0.2)

## 2023-10-17 LAB — PROTIME-INR
INR: 1 (ref 0.8–1.2)
Prothrombin Time: 13.5 s (ref 11.4–15.2)

## 2023-10-17 LAB — CREATININE, SERUM
Creatinine, Ser: 0.51 mg/dL — ABNORMAL LOW (ref 0.61–1.24)
GFR, Estimated: >60 mL/min (ref >60)

## 2023-10-17 SURGERY — INCISION AND DRAINAGE, ABSCESS
Anesthesia: General | Laterality: Right

## 2023-10-17 SURGERY — IRRIGATION AND DEBRIDEMENT ELBOW
Anesthesia: General | Site: Elbow | Laterality: Right

## 2023-10-17 MED ORDER — HYDROMORPHONE HCL 1 MG/ML IJ SOLN
0.5000 mg | INTRAMUSCULAR | Status: AC | PRN
  Administered 2023-10-17 (×3): 0.5 mg via INTRAVENOUS

## 2023-10-17 MED ORDER — FENTANYL CITRATE (PF) 250 MCG/5ML IJ SOLN
INTRAMUSCULAR | Status: AC
  Filled 2023-10-17: qty 5

## 2023-10-17 MED ORDER — CHLORHEXIDINE GLUCONATE 0.12 % MT SOLN
OROMUCOSAL | Status: AC
  Administered 2023-10-17: 15 mL via OROMUCOSAL
  Filled 2023-10-17: qty 15

## 2023-10-17 MED ORDER — BUPIVACAINE HCL (PF) 0.25 % IJ SOLN
INTRAMUSCULAR | Status: AC
  Filled 2023-10-17: qty 30

## 2023-10-17 MED ORDER — HYDROMORPHONE HCL 1 MG/ML IJ SOLN
INTRAMUSCULAR | Status: AC
  Administered 2023-10-17: 0.5 mg via INTRAVENOUS
  Filled 2023-10-17: qty 2

## 2023-10-17 MED ORDER — CEFAZOLIN SODIUM-DEXTROSE 2-4 GM/100ML-% IV SOLN
2.0000 g | INTRAVENOUS | Status: AC
  Administered 2023-10-17: 2 g via INTRAVENOUS

## 2023-10-17 MED ORDER — PHENYLEPHRINE 80 MCG/ML (10ML) SYRINGE FOR IV PUSH (FOR BLOOD PRESSURE SUPPORT)
PREFILLED_SYRINGE | INTRAVENOUS | Status: AC
  Filled 2023-10-17: qty 10

## 2023-10-17 MED ORDER — ACETAMINOPHEN 500 MG PO TABS: 1000.0000 mg | ORAL_TABLET | Freq: Once | ORAL | Status: AC

## 2023-10-17 MED ORDER — DEXAMETHASONE SODIUM PHOSPHATE 10 MG/ML IJ SOLN
INTRAMUSCULAR | Status: DC | PRN
  Administered 2023-10-17: 10 mg via INTRAVENOUS

## 2023-10-17 MED ORDER — FENTANYL CITRATE (PF) 250 MCG/5ML IJ SOLN
INTRAMUSCULAR | Status: DC | PRN
  Administered 2023-10-17: 50 ug via INTRAVENOUS
  Administered 2023-10-17: 100 ug via INTRAVENOUS

## 2023-10-17 MED ORDER — CHLORHEXIDINE GLUCONATE 0.12 % MT SOLN: 15.0000 mL | Freq: Once | OROMUCOSAL | Status: AC

## 2023-10-17 MED ORDER — ONDANSETRON HCL 4 MG/2ML IJ SOLN
INTRAMUSCULAR | Status: AC
  Filled 2023-10-17: qty 2

## 2023-10-17 MED ORDER — KETAMINE HCL 50 MG/5ML IJ SOSY
PREFILLED_SYRINGE | INTRAMUSCULAR | Status: AC
  Filled 2023-10-17: qty 5

## 2023-10-17 MED ORDER — HYDROMORPHONE HCL 1 MG/ML IJ SOLN
0.2500 mg | INTRAMUSCULAR | Status: DC | PRN
  Administered 2023-10-17 (×3): 0.5 mg via INTRAVENOUS

## 2023-10-17 MED ORDER — PROPOFOL 10 MG/ML IV BOLUS
INTRAVENOUS | Status: DC | PRN
  Administered 2023-10-17: 200 mg via INTRAVENOUS

## 2023-10-17 MED ORDER — KETAMINE HCL 50 MG/5ML IJ SOSY
PREFILLED_SYRINGE | INTRAMUSCULAR | Status: DC | PRN
  Administered 2023-10-17: 30 mg via INTRAVENOUS

## 2023-10-17 MED ORDER — EPHEDRINE SULFATE-NACL 50-0.9 MG/10ML-% IV SOSY
PREFILLED_SYRINGE | INTRAVENOUS | Status: DC | PRN
  Administered 2023-10-17 (×2): 5 mg via INTRAVENOUS

## 2023-10-17 MED ORDER — ACETAMINOPHEN 500 MG PO TABS
ORAL_TABLET | ORAL | Status: AC
  Administered 2023-10-17: 1000 mg via ORAL
  Filled 2023-10-17: qty 2

## 2023-10-17 MED ORDER — ORAL CARE MOUTH RINSE: 15.0000 mL | Freq: Once | OROMUCOSAL | Status: AC

## 2023-10-17 MED ORDER — DEXAMETHASONE SODIUM PHOSPHATE 10 MG/ML IJ SOLN
INTRAMUSCULAR | Status: AC
  Filled 2023-10-17: qty 1

## 2023-10-17 MED ORDER — POVIDONE-IODINE 10 % EX SWAB
2.0000 | Freq: Once | CUTANEOUS | Status: AC
  Administered 2023-10-17: 2 via TOPICAL

## 2023-10-17 MED ORDER — MIDAZOLAM HCL 2 MG/2ML IJ SOLN
INTRAMUSCULAR | Status: AC
  Filled 2023-10-17: qty 2

## 2023-10-17 MED ORDER — LACTATED RINGERS IV SOLN
INTRAVENOUS | Status: DC
Start: 2023-10-17 — End: 2023-10-17

## 2023-10-17 MED ORDER — CHLORHEXIDINE GLUCONATE 4 % EX SOLN: 60.0000 mL | Freq: Once | CUTANEOUS | Status: AC

## 2023-10-17 MED ORDER — LIDOCAINE 2% (20 MG/ML) 5 ML SYRINGE
INTRAMUSCULAR | Status: DC | PRN
  Administered 2023-10-17: 80 mg via INTRAVENOUS

## 2023-10-17 MED ORDER — CEFAZOLIN SODIUM-DEXTROSE 2-4 GM/100ML-% IV SOLN
INTRAVENOUS | Status: AC
  Filled 2023-10-17: qty 100

## 2023-10-17 MED ORDER — PHENYLEPHRINE 80 MCG/ML (10ML) SYRINGE FOR IV PUSH (FOR BLOOD PRESSURE SUPPORT)
PREFILLED_SYRINGE | INTRAVENOUS | Status: DC | PRN
  Administered 2023-10-17: 80 ug via INTRAVENOUS

## 2023-10-17 MED ORDER — SODIUM CHLORIDE 0.9 % IR SOLN
Status: DC | PRN
  Administered 2023-10-17: 3000 mL

## 2023-10-17 MED ORDER — ONDANSETRON HCL 4 MG/2ML IJ SOLN
INTRAMUSCULAR | Status: DC | PRN
  Administered 2023-10-17: 4 mg via INTRAVENOUS

## 2023-10-17 MED ORDER — MELATONIN 5 MG PO TABS
10.0000 mg | ORAL_TABLET | Freq: Once | ORAL | Status: AC
  Administered 2023-10-17: 10 mg via ORAL
  Filled 2023-10-17: qty 2

## 2023-10-17 MED ORDER — 0.9 % SODIUM CHLORIDE (POUR BTL) OPTIME
TOPICAL | Status: DC | PRN
  Administered 2023-10-17: 1000 mL

## 2023-10-17 MED ORDER — MIDAZOLAM HCL 2 MG/2ML IJ SOLN
INTRAMUSCULAR | Status: DC | PRN
  Administered 2023-10-17: 2 mg via INTRAVENOUS

## 2023-10-17 MED ORDER — PROPOFOL 10 MG/ML IV BOLUS
INTRAVENOUS | Status: AC
  Filled 2023-10-17: qty 20

## 2023-10-17 MED ORDER — OXYCODONE HCL 5 MG PO TABS
5.0000 mg | ORAL_TABLET | Freq: Once | ORAL | Status: AC | PRN
  Administered 2023-10-17: 5 mg via ORAL
  Filled 2023-10-17: qty 1

## 2023-10-17 SURGICAL SUPPLY — 33 items
BNDG ELASTIC 3INX 5YD STR LF (GAUZE/BANDAGES/DRESSINGS) ×2 IMPLANT
BNDG GAUZE DERMACEA FLUFF 4 (GAUZE/BANDAGES/DRESSINGS) ×2 IMPLANT
CORD BIPOLAR FORCEPS 12FT (ELECTRODE) ×2 IMPLANT
COVER SURGICAL LIGHT HANDLE (MISCELLANEOUS) ×2 IMPLANT
CUFF TOURN SGL QUICK 18X4 (TOURNIQUET CUFF) ×2 IMPLANT
DRAPE SURG 17X23 STRL (DRAPES) ×2 IMPLANT
DRSG ADAPTIC 3X8 NADH LF (GAUZE/BANDAGES/DRESSINGS) ×2 IMPLANT
ELECT REM PT RETURN 9FT ADLT (ELECTROSURGICAL) ×1
ELECTRODE REM PT RTRN 9FT ADLT (ELECTROSURGICAL) IMPLANT
GAUZE SPONGE 4X4 12PLY STRL (GAUZE/BANDAGES/DRESSINGS) ×2 IMPLANT
GLOVE BIOGEL PI IND STRL 8.5 (GLOVE) ×2 IMPLANT
GLOVE SURG ORTHO 8.0 STRL STRW (GLOVE) ×2 IMPLANT
GOWN STRL REUS W/ TWL LRG LVL3 (GOWN DISPOSABLE) ×6 IMPLANT
GOWN STRL REUS W/ TWL XL LVL3 (GOWN DISPOSABLE) ×2 IMPLANT
KIT BASIN OR (CUSTOM PROCEDURE TRAY) ×2 IMPLANT
KIT TURNOVER KIT B (KITS) ×2 IMPLANT
MANIFOLD NEPTUNE II (INSTRUMENTS) ×2 IMPLANT
NS IRRIG 1000ML POUR BTL (IV SOLUTION) ×2 IMPLANT
PACK ORTHO EXTREMITY (CUSTOM PROCEDURE TRAY) ×2 IMPLANT
PAD ARMBOARD 7.5X6 YLW CONV (MISCELLANEOUS) ×4 IMPLANT
PAD CAST 4YDX4 CTTN HI CHSV (CAST SUPPLIES) ×2 IMPLANT
SET CYSTO W/LG BORE CLAMP LF (SET/KITS/TRAYS/PACK) IMPLANT
SOAP 2 % CHG 4 OZ (WOUND CARE) ×2 IMPLANT
SPLINT FIBERGLASS 3X35 (CAST SUPPLIES) IMPLANT
SUT PROLENE 2 0 SH 30 (SUTURE) IMPLANT
SUT PROLENE 3 0 PS 2 (SUTURE) IMPLANT
SWAB COLLECTION DEVICE MRSA (MISCELLANEOUS) ×2 IMPLANT
SWAB CULTURE ESWAB REG 1ML (MISCELLANEOUS) IMPLANT
TOWEL GREEN STERILE FF (TOWEL DISPOSABLE) ×2 IMPLANT
TUBE CONNECTING 12X1/4 (SUCTIONS) ×2 IMPLANT
UNDERPAD 30X36 HEAVY ABSORB (UNDERPADS AND DIAPERS) ×2 IMPLANT
WATER STERILE IRR 1000ML POUR (IV SOLUTION) ×2 IMPLANT
YANKAUER SUCT BULB TIP NO VENT (SUCTIONS) ×2 IMPLANT

## 2023-10-17 NOTE — Anesthesia Preprocedure Evaluation (Addendum)
Anesthesia Evaluation  Patient identified by MRN, date of birth, ID band Patient awake    Reviewed: Allergy & Precautions, NPO status , Patient's Chart, lab work & pertinent test results  Airway Mallampati: III  TM Distance: >3 FB Neck ROM: Full    Dental  (+) Edentulous Upper, Edentulous Lower, Dental Advisory Given   Pulmonary Patient abstained from smoking., former smoker   Pulmonary exam normal breath sounds clear to auscultation       Cardiovascular negative cardio ROS Normal cardiovascular exam Rhythm:Regular Rate:Normal     Neuro/Psych  PSYCHIATRIC DISORDERS   Bipolar Disorder   negative neurological ROS     GI/Hepatic negative GI ROS,,,(+)     substance abuse  IV drug use  Endo/Other  negative endocrine ROS    Renal/GU negative Renal ROS  negative genitourinary   Musculoskeletal negative musculoskeletal ROS (+)    Abdominal   Peds  Hematology negative hematology ROS (+)   Anesthesia Other Findings 43 y.o. M with bipolar 1, IVDU, hx MSSA bacteremia 2 months ago in Nov 2024, negative TEE at that time, depression and recurrent SI, just discharged from [facility] 2 weeks PTA, who presented with right elbow swelling, redness and pain.  Reproductive/Obstetrics                             Anesthesia Physical Anesthesia Plan  ASA: 2  Anesthesia Plan: General   Post-op Pain Management: Ketamine IV*, Dilaudid IV and Tylenol PO (pre-op)*   Induction: Intravenous  PONV Risk Score and Plan: 2 and Ondansetron, Dexamethasone and Midazolam  Airway Management Planned: LMA  Additional Equipment:   Intra-op Plan:   Post-operative Plan: Extubation in OR  Informed Consent: I have reviewed the patients History and Physical, chart, labs and discussed the procedure including the risks, benefits and alternatives for the proposed anesthesia with the patient or authorized representative who has  indicated his/her understanding and acceptance.     Dental advisory given  Plan Discussed with: CRNA  Anesthesia Plan Comments:        Anesthesia Quick Evaluation

## 2023-10-17 NOTE — Anesthesia Procedure Notes (Signed)
Procedure Name: LMA Insertion Date/Time: 10/17/2023 1:19 PM  Performed by: Ammie Dalton, CRNAPre-anesthesia Checklist: Patient identified, Emergency Drugs available, Suction available and Patient being monitored Patient Re-evaluated:Patient Re-evaluated prior to induction Oxygen Delivery Method: Circle System Utilized Preoxygenation: Pre-oxygenation with 100% oxygen Induction Type: IV induction Ventilation: Mask ventilation without difficulty LMA: LMA inserted LMA Size: 5.0 Number of attempts: 1 Airway Equipment and Method: Bite block Placement Confirmation: positive ETCO2 Tube secured with: Tape Dental Injury: Teeth and Oropharynx as per pre-operative assessment

## 2023-10-17 NOTE — Transfer of Care (Signed)
Immediate Anesthesia Transfer of Care Note  Patient: Trevor [name]  Procedure(s) Performed: IRRIGATION AND DEBRIDEMENT ELBOW (Right)  Patient Location: PACU  Anesthesia Type:General  Level of Consciousness: drowsy and patient cooperative  Airway & Oxygen Therapy: Patient Spontanous Breathing and Patient connected to nasal cannula oxygen  Post-op Assessment: Report given to RN and Post -op Vital signs reviewed and stable  Post vital signs: Reviewed and stable  Last Vitals:  Vitals Value Taken Time  BP 182/74 10/17/23 1422  Temp    Pulse 65 10/17/23 1423  Resp 11 10/17/23 1423  SpO2 99 % 10/17/23 1423  Vitals shown include unfiled device data.  Last Pain:  Vitals:   10/17/23 1210  TempSrc:   PainSc: 5       Patients Stated Pain Goal: 2 (10/17/23 1210)  Complications: No notable events documented.
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
## 2023-10-17 NOTE — Progress Notes (Signed)
MRI of the right forearm, elbow, and humerus were reviewed.  Agree with the radiology reports. MRI of the forearm: 1. Circumferential subcutaneous edema and fluid throughout the forearm, more pronounced dorsally. Appearance most compatible with cellulitis. 2. Fluid collection at the posterior elbow with mild bone marrow edema within the olecranon process of the proximal ulna. 3. Mild flexor tenosynovitis of the distal forearm and wrist. Trace tenosynovial fluid associated with extensor compartment 2 at the wrist MRI of the elbow: 1. 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 2. Circumferential subcutaneous edema with organized fluid collection overlying the olecranon process measuring approximately 2.3 x 0.6 x 2.2 cm. Appearance favors cellulitis with abscess. Olecranon bursitis (which could be septic or aseptic) is also a consideration. 3. No evidence of septic arthritis of the right elbow. MRI humerus: 1. Subcutaneous edema and ill-defined fluid most pronounced along the medial aspect of the mid to distal upper arm, most compatible with cellulitis. No organized or drainable fluid collections. 2. No evidence of osteomyelitis or septic arthritis. 3. Multiple enlarged right axillary lymph nodes, largest measuring up to 1.5 cm short axis, likely reactive. 4. Mild intramuscular edema within the proximal aspects of the anterior deltoid and lateral deltoid muscles, likely representing mild muscle strain.  Discussed findings with Dr. Orlan Leavens.  Will plan on formal I&D this afternoon.  Patient should remain n.p.o. pending surgery.  Will defer ongoing medical management to Dr. Orlan Leavens.

## 2023-10-17 NOTE — Op Note (Signed)
PREOPERATIVE DIAGNOSIS: Right elbow abscess Right elbow olecranon bursitis  POSTOPERATIVE DIAGNOSIS: Same  ATTENDING SURGEON: Dr. Bradly Bienenstock who scrubbed and present for the entire procedure  ASSISTANT SURGEON: None  ANESTHESIA: General Via LMA  OPERATIVE PROCEDURE: Right elbow olecranon bursectomy Right elbow incision and drainage of deep abscess right elbow and forearm region  IMPLANTS: None  EBL: Minimal  RADIOGRAPHIC INTERPRETATION: None  SURGICAL INDICATIONS: Patient is a right-hand-dominant gentleman who was admitted to the internal medicine service with a worsening right elbow infection.  Patient was seen and evaluated recommend undergo the above procedure.  Risks of surgery include but not limited to bleeding infection damage nearby nerves arteries or tendons loss of motion of the wrist and digits incomplete relief of symptoms and need for further surgical invention.  SURGICAL TECHNIQUE: The patient was prepped identified in the preoperative holding area marked apart a marker made on the right elbow and indicate correct operative site.  Patient brought back the operating placed supine on the anesthesia table where the general anesthetic was administered.  Patient tolerates well.  Well-padded tourniquet placed on the right brachium and seated with the appropriate drape.  The right upper extremities then prepped and draped in normal sterile fashion.  A timeout was called the correct site was identified procedure then began.  The patient did have the open sinus areas over the posterior surface of the elbow.  The skin incision was incorporated to remove the sinus tracts.  Dissection carried out through the skin and subcutaneous tissue of the tourniquet insufflated.  The abscess area was then decompressed both proximally and distally.  Cultures were taken.  Circumferential olecranon bursectomy was then carried out.  2 separate interventions were then carried out for the elbow.  The wound  was then thoroughly irrigated.  3 L of saline solution drained and irrigated throughout the elbow.  Tourniquet deflated.  There is good perfusion.  Hemostasis obtained and the wound was then closed with 3-0 and 2-0 Prolene suture.  Adaptic dressing sterile compressive bandage was applied.  The patient is then placed in a long-arm posterior splint taken recovery room in good condition.  Tissue cultures have been sent.  POSTOPERATIVE PLAN: Patient admitted back to the internal medicine service.  See him back in the office in 1 week continue with a long-arm splint.  Do not take the splint off.  IV antibiotics per the primary team.  Tissue cultures were sent we will follow-up on the tissue cultures.

## 2023-10-17 NOTE — Plan of Care (Signed)

## 2023-10-17 NOTE — Progress Notes (Signed)
  Progress Note   Patient: Trevor [name] WUJ:[id number] DOB: 04-22-1981 DOA: 10/13/2023     4 DOS: the patient was seen and examined on 10/17/2023        Brief hospital course: 43 y.o. M with bipolary 1, IVDU, hx MSSA bacteremia 2 months ago in Nov 2024, negative TEE at that time, depression and recurrent SI, just discharged from [facility] 2 weeks PTA, who presented with right elbow swelling, redness and pain.     Assessment and Plan: * Cellulitis of right elbow To the OR today for right olecranon bursectomy, incision and drainage of deep abscess Yesterday shows fluid collection at the elbow.  There is a question of osteomyelitis. -Continue vancomycin - Follow culture data   Influenza Resolved  Polysubstance abuse (HCC) - TOC consult  Normocytic anemia Hgb stable overnight, no bleeding observed  Bipolar 1 disorder (HCC) - Continue BUspar, Lexapro, quetiapine  Hypokalemia Resolved          Subjective: Still has arm pain and swelling.  No fever.  Hungry.  Up to the OR today.     Physical Exam: BP (!) 185/85 (BP Location: Left Leg)   Pulse 78   Temp 97.8 F (36.6 C)   Resp 17   Ht 6' (1.829 m)   Wt 74.8 kg   SpO2 98%   BMI 22.38 kg/m   Adult male, lying in bed, sleeping, interactive and appropriate when woken RRR, no murmurs, no peripheral edema Respiratory rate normal, lungs clear without rales or wheezes Abdomen soft no tenderness palpation or guarding, no ascites or distention   Data Reviewed: MRI reports reviewed Discussed with orthopedics  Family Communication: None present    Disposition: Status is: Inpatient         Author: Alberteen Sam, MD 10/17/2023 5:52 PM  For on call review [email or web address].

## 2023-10-18 ENCOUNTER — Encounter (HOSPITAL_COMMUNITY): Payer: Self-pay | Admitting: Orthopedic Surgery

## 2023-10-18 DIAGNOSIS — M7021 Olecranon bursitis, right elbow: Secondary | ICD-10-CM

## 2023-10-18 DIAGNOSIS — F1510 Other stimulant abuse, uncomplicated: Secondary | ICD-10-CM

## 2023-10-18 DIAGNOSIS — R768 Other specified abnormal immunological findings in serum: Secondary | ICD-10-CM

## 2023-10-18 DIAGNOSIS — L03113 Cellulitis of right upper limb: Secondary | ICD-10-CM

## 2023-10-18 DIAGNOSIS — F319 Bipolar disorder, unspecified: Secondary | ICD-10-CM | POA: Diagnosis not present

## 2023-10-18 DIAGNOSIS — F1990 Other psychoactive substance use, unspecified, uncomplicated: Secondary | ICD-10-CM

## 2023-10-18 DIAGNOSIS — M86232 Subacute osteomyelitis, left radius and ulna: Secondary | ICD-10-CM

## 2023-10-18 LAB — CULTURE, BLOOD (ROUTINE X 2)
Culture: NO GROWTH
Culture: NO GROWTH

## 2023-10-18 LAB — CBC
HCT: 33.7 % — ABNORMAL LOW (ref 39.0–52.0)
Hemoglobin: 11 g/dL — ABNORMAL LOW (ref 13.0–17.0)
MCH: 29.3 pg (ref 26.0–34.0)
MCHC: 32.6 g/dL (ref 30.0–36.0)
MCV: 89.9 fL (ref 80.0–100.0)
Platelets: 336 10*3/uL (ref 150–400)
RBC: 3.75 MIL/uL — ABNORMAL LOW (ref 4.22–5.81)
RDW: 13.5 % (ref 11.5–15.5)
WBC: 8.3 10*3/uL (ref 4.0–10.5)
nRBC: 0 % (ref 0.0–0.2)

## 2023-10-18 LAB — BASIC METABOLIC PANEL
Anion gap: 8 (ref 5–15)
BUN: 23 mg/dL — ABNORMAL HIGH (ref 6–20)
CO2: 28 mmol/L (ref 22–32)
Calcium: 8.6 mg/dL — ABNORMAL LOW (ref 8.9–10.3)
Chloride: 97 mmol/L — ABNORMAL LOW (ref 98–111)
Creatinine, Ser: 0.69 mg/dL (ref 0.61–1.24)
GFR, Estimated: >60 mL/min (ref >60)
Glucose, Bld: 203 mg/dL — ABNORMAL HIGH (ref 70–99)
Potassium: 3.9 mmol/L (ref 3.5–5.1)
Sodium: 133 mmol/L — ABNORMAL LOW (ref 135–145)

## 2023-10-18 MED ORDER — HYDROXYZINE HCL 25 MG PO TABS
50.0000 mg | ORAL_TABLET | Freq: Four times a day (QID) | ORAL | Status: DC | PRN
  Administered 2023-10-18 – 2023-10-22 (×9): 50 mg via ORAL
  Filled 2023-10-18 (×9): qty 2

## 2023-10-18 MED ORDER — ENSURE ENLIVE PO LIQD
237.0000 mL | Freq: Two times a day (BID) | ORAL | Status: DC
  Administered 2023-10-18 – 2023-10-26 (×15): 237 mL via ORAL

## 2023-10-18 MED ORDER — CEFAZOLIN SODIUM-DEXTROSE 2-4 GM/100ML-% IV SOLN
2.0000 g | Freq: Three times a day (TID) | INTRAVENOUS | Status: DC
  Administered 2023-10-18 – 2023-10-25 (×22): 2 g via INTRAVENOUS
  Filled 2023-10-18 (×21): qty 100

## 2023-10-18 MED ORDER — MELATONIN 5 MG PO TABS
10.0000 mg | ORAL_TABLET | Freq: Once | ORAL | Status: AC
  Administered 2023-10-19: 10 mg via ORAL
  Filled 2023-10-18: qty 2

## 2023-10-18 MED ORDER — AMLODIPINE BESYLATE 5 MG PO TABS
5.0000 mg | ORAL_TABLET | Freq: Every day | ORAL | Status: DC
  Administered 2023-10-18 – 2023-10-26 (×9): 5 mg via ORAL
  Filled 2023-10-18 (×9): qty 1

## 2023-10-18 MED ORDER — ORAL CARE MOUTH RINSE: 15.0000 mL | OROMUCOSAL | Status: DC | PRN

## 2023-10-18 NOTE — Anesthesia Postprocedure Evaluation (Signed)
Anesthesia Post Note  Patient: Trevor [name]  Procedure(s) Performed: IRRIGATION AND DEBRIDEMENT ELBOW (Right: Elbow)     Patient location during evaluation: PACU Anesthesia Type: General Level of consciousness: awake and alert Pain management: pain level controlled Vital Signs Assessment: post-procedure vital signs reviewed and stable Respiratory status: spontaneous breathing, nonlabored ventilation, respiratory function stable and patient connected to nasal cannula oxygen Cardiovascular status: blood pressure returned to baseline and stable Postop Assessment: no apparent nausea or vomiting Anesthetic complications: no  No notable events documented.  Last Vitals:  Vitals:   10/18/23 1004 10/18/23 1306  BP: (!) 166/70 (!) 186/67  Pulse: 63 65  Resp: 15 16  Temp: 36.8 C (!) 36.3 C  SpO2: 99% 100%    Last Pain:  Vitals:   10/18/23 1351  TempSrc:   PainSc: 5    Pain Goal: Patients Stated Pain Goal: 2 (10/18/23 1306)                 Chayla Shands L Pearlie Lafosse

## 2023-10-18 NOTE — Progress Notes (Signed)
Progress Note   Patient: Trevor [name] ZOX:[id number] DOB: December 16, 1980 DOA: 10/13/2023     5 DOS: the patient was seen and examined on 10/18/2023 at 12:53PM      Brief hospital course: 43 y.o. M with bipolary 1, IVDU, hx MSSA bacteremia 2 months ago in Nov 2024, negative TEE at that time, depression and recurrent SI, just discharged from [facility] 2 weeks PTA, who presented with right elbow swelling, redness and pain.     Assessment and Plan: Cellulitis and abscess of the right elbow S/p right olecranon bursectomy, I&D of deep abscess 1/27 by Dr. Orlan Leavens The patient was admitted and started on broad-spectrum IV antibiotics.  Admission blood cultures no growth.  His swelling did not improve, and fluctuance developed over the first 48 hours, so orthopedics were consulted and he was taken to the OR 1/27 by Dr. Orlan Leavens for right olecranon bursectomy, incision and drainage of deep abscess.  Doing well post-op.  Discussed MRI findings with Dr. Orlan Leavens, enhancement of olecranon likely reflects proximity of the bone there to the skin, not early osteomyelitis, and no signs of osteomyelitis were definitely seen on surgical exploration.  Give this finding, proximity of infection to joint, and recent MSSA bacteremia, I will consult ID for agent and duration.  See below re: 1/25 aerobic culture - Continue antibiotics, narrow to cefazolin - Consult ID, appreciate expertise - Outpatient ortho follow up -Continue Ensure   Hypertension Patient has chronic hypertension, not treated - Start amlodipine  Polysubstance abuse (HCC) - TOC consult  Normocytic anemia Hgb stable post op - Trend Hgb  Bipolar 1 disorder (HCC) Patient refusing Seroquel - Continue BuSpar and Lexapro  Hypokalemia Supplemented and resolved          Subjective: Patient's pain is managed.  He has no fever, confusion, respiratory symptoms.  Orthopedics took to the OR yesterday, ID were consulted  today     Physical Exam: BP (!) 186/67 (BP Location: Left Leg)   Pulse 65   Temp (!) 97.4 F (36.3 C) (Oral)   Resp 16   Ht 6' (1.829 m)   Wt 74.8 kg   SpO2 100%   BMI 22.38 kg/m   Thin adult male, lying in bed, interactive and appropriate RRR, no murmurs, no peripheral edema Respiratory rate normal, lungs clear without rales or wheezes The right arm is wrapped in a splint, not able to visualize the arm, however the hand is still edematous and reddish Attention normal, affect appropriate, judgment and insight appear normal, no active mania, paranoia    Data Reviewed: Discussed with infectious disease and orthopedics Basic metabolic panel shows mild hyponatremia, stable renal function CBC shows mild anemia Culture from 1/25 was obtained at the bedside from a draining wound (the skin was cleansed, then pus was expressed from the sinus tract and sent for culture), only growing group G strep, however this was after 2 days antibiotics Intraoperative cultures NG so far  Family Communication: None    Disposition: Status is: Inpatient The patient was admitted for cellulitis of the elbow, this did not improve with medical therapy, orthopedics were consulted and they washed the wound out yesterday.    We will consult infectious disease given possibility of osteomyelitis, recent MSSA bacteremia, and possibility of joint infection.  Defer agent and duration of therapy to ID. Once ID have determined their regimen, would be stable for discharge        Author: Alberteen Sam, MD 10/18/2023 3:20 PM  For on call  review [email or web address].

## 2023-10-18 NOTE — Progress Notes (Addendum)
Patient c/o 10/10 of pain beginning of night shift. Notified J. Reuel Boom, NP. New one-time order oxycodone 5 mg ordered and given.   Patient also refused the seroquel ordered and requested melatonin for sleep. Notified J. Garner Nash, NP. New order placed and given.

## 2023-10-18 NOTE — Plan of Care (Signed)
  Problem: Health Behavior/Discharge Planning: Goal: Ability to manage health-related needs will improve Outcome: Progressing   Problem: Clinical Measurements: Goal: Ability to maintain clinical measurements within normal limits will improve Outcome: Progressing Goal: Will remain free from infection Outcome: Progressing Goal: Diagnostic test results will improve Outcome: Progressing Goal: Respiratory complications will improve Outcome: Progressing Goal: Cardiovascular complication will be avoided Outcome: Progressing   Problem: Activity: Goal: Risk for activity intolerance will decrease Outcome: Progressing   Problem: Nutrition: Goal: Adequate nutrition will be maintained Outcome: Progressing   Problem: Coping: Goal: Level of anxiety will decrease Outcome: Progressing   Problem: Elimination: Goal: Will not experience complications related to bowel motility Outcome: Progressing   Problem: Pain Managment: Goal: General experience of comfort will improve and/or be controlled Outcome: Progressing   Problem: Safety: Goal: Ability to remain free from injury will improve Outcome: Progressing   Problem: Skin Integrity: Goal: Risk for impaired skin integrity will decrease Outcome: Progressing   Problem: Clinical Measurements: Goal: Ability to avoid or minimize complications of infection will improve Outcome: Progressing   Problem: Skin Integrity: Goal: Skin integrity will improve Outcome: Progressing
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
## 2023-10-19 DIAGNOSIS — F1910 Other psychoactive substance abuse, uncomplicated: Secondary | ICD-10-CM

## 2023-10-19 DIAGNOSIS — F319 Bipolar disorder, unspecified: Secondary | ICD-10-CM

## 2023-10-19 DIAGNOSIS — Z87898 Personal history of other specified conditions: Secondary | ICD-10-CM | POA: Diagnosis not present

## 2023-10-19 DIAGNOSIS — L03113 Cellulitis of right upper limb: Secondary | ICD-10-CM | POA: Diagnosis not present

## 2023-10-19 DIAGNOSIS — M71121 Other infective bursitis, right elbow: Secondary | ICD-10-CM | POA: Diagnosis not present

## 2023-10-19 DIAGNOSIS — M869 Osteomyelitis, unspecified: Secondary | ICD-10-CM

## 2023-10-19 LAB — C-REACTIVE PROTEIN: CRP: 1.8 mg/dL — ABNORMAL HIGH (ref <1.0)

## 2023-10-19 LAB — BASIC METABOLIC PANEL
Anion gap: 9 (ref 5–15)
BUN: 20 mg/dL (ref 6–20)
CO2: 29 mmol/L (ref 22–32)
Calcium: 8.9 mg/dL (ref 8.9–10.3)
Chloride: 102 mmol/L (ref 98–111)
Creatinine, Ser: 0.33 mg/dL — ABNORMAL LOW (ref 0.61–1.24)
GFR, Estimated: >60 mL/min (ref >60)
Glucose, Bld: 103 mg/dL — ABNORMAL HIGH (ref 70–99)
Potassium: 3.8 mmol/L (ref 3.5–5.1)
Sodium: 140 mmol/L (ref 135–145)

## 2023-10-19 LAB — CBC
HCT: 35 % — ABNORMAL LOW (ref 39.0–52.0)
Hemoglobin: 11.5 g/dL — ABNORMAL LOW (ref 13.0–17.0)
MCH: 29.8 pg (ref 26.0–34.0)
MCHC: 32.9 g/dL (ref 30.0–36.0)
MCV: 90.7 fL (ref 80.0–100.0)
Platelets: 365 10*3/uL (ref 150–400)
RBC: 3.86 MIL/uL — ABNORMAL LOW (ref 4.22–5.81)
RDW: 13.8 % (ref 11.5–15.5)
WBC: 9.7 10*3/uL (ref 4.0–10.5)
nRBC: 0 % (ref 0.0–0.2)

## 2023-10-19 LAB — HEPATITIS A ANTIBODY, TOTAL: hep A Total Ab: REACTIVE — AB

## 2023-10-19 LAB — SEDIMENTATION RATE: Sed Rate: 38 mm/h — ABNORMAL HIGH (ref 0–16)

## 2023-10-19 LAB — HEPATITIS B SURFACE ANTIGEN: Hepatitis B Surface Ag: NONREACTIVE

## 2023-10-19 MED ORDER — MELATONIN 5 MG PO TABS
10.0000 mg | ORAL_TABLET | Freq: Every evening | ORAL | Status: AC | PRN
  Administered 2023-10-20 – 2023-10-22 (×3): 10 mg via ORAL
  Filled 2023-10-19 (×3): qty 2

## 2023-10-19 MED ORDER — NAPROXEN 250 MG PO TABS
500.0000 mg | ORAL_TABLET | Freq: Two times a day (BID) | ORAL | Status: DC | PRN
  Administered 2023-10-19: 500 mg via ORAL
  Filled 2023-10-19: qty 2

## 2023-10-19 MED ORDER — ACETAMINOPHEN 500 MG PO TABS
1000.0000 mg | ORAL_TABLET | Freq: Once | ORAL | Status: AC
  Administered 2023-10-20: 1000 mg via ORAL
  Filled 2023-10-19: qty 2

## 2023-10-19 MED ORDER — NAPROXEN 250 MG PO TABS
500.0000 mg | ORAL_TABLET | Freq: Three times a day (TID) | ORAL | Status: DC | PRN
  Administered 2023-10-20 – 2023-10-26 (×10): 500 mg via ORAL
  Filled 2023-10-19 (×10): qty 2

## 2023-10-19 MED ORDER — METHOCARBAMOL 500 MG PO TABS
750.0000 mg | ORAL_TABLET | Freq: Four times a day (QID) | ORAL | Status: DC | PRN
  Administered 2023-10-19 – 2023-10-26 (×11): 750 mg via ORAL
  Filled 2023-10-19 (×14): qty 2
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
## 2023-10-19 NOTE — Progress Notes (Addendum)
  Progress Note   Patient: Trevor [name] XBJ:[id number] DOB: 02/14/1981 DOA: 10/13/2023     6 DOS: the patient was seen and examined on 10/19/2023 at 11:12 AM      Brief hospital course: 43 y.o. M with bipolary 1, IVDU, hx MSSA bacteremia 2 months ago in Nov 2024, negative TEE at that time, depression and recurrent SI, just discharged from [facility] 2 weeks PTA, who presented with right elbow swelling, redness and pain.     Assessment and Plan: Cellulitis and abscess of the right elbow Olecranon osteomyelitis Status post right olecranon bursectomy and I&D of deep abscess 1/27 by Dr. Orlan Leavens Patient was admitted and started on broad-spectrum IV antibiotics.  Admission blood cultures no growth.  Swelling did not improve, fluctuance developed, so orthopedics were consulted and he was taken to the OR for bursectomy and I&D.  MRI findings showed possible osteomyelitis of the olecranon process, so ID recommended 6 weeks IV antibiotics. - Continue cefazolin - Consult ID, appreciate expertise - Outpatient Ortho follow-up - Continue Ensure  Normocytic anemia Hemoglobin has remained stable  Bipolar 1 disorder (HCC) Seems to be refusing the Seroquel that was started for him a few weeks ago in [facility] -Continue BuSpar, Lexapro, Seroquel  Hypokalemia Supplemented and resolved  Polysubstance abuse The patient is asked to avoid opiates for pain control. - Acetaminophen, naproxen, Robaxin - If the above is not effective, I will plan to start buprenorphine            Subjective: Patient is having elbow pain, not controlled with Tylenol or tramadol.  No fever, no confusion     Physical Exam: BP (!) 154/86 (BP Location: Right Leg)   Pulse 70   Temp 97.8 F (36.6 C)   Resp 18   Ht 6' (1.829 m)   Wt 74.8 kg   SpO2 100%   BMI 22.38 kg/m   Thin adult male, lying in bed, interactive and appropriate RRR, no murmurs, no extremity edema.  The right hand is relatively  edematous Respiratory rate normal, lungs clear without rales or wheezes Right arm is wrapped in the splint, not able to visualize the arm Attention normal, affect appropriate, judgment and insight appear normal  Data Reviewed: Sed rate 38 Basic metabolic panel normal White blood cell count normal Hemoglobin 11.5 Discussed with ID    Family Communication:     Disposition: Status is: Inpatient The patient was admitted for cellulitis of the elbow, this did not improve with medical therapy, orthopedics were consulted and they washed the wound out yesterday.     Consulted infectious disease given possibility of osteomyelitis, recent MSSA bacteremia, and possibility of joint infection.   Defer agent and duration of therapy to ID, they anticipate several weeks Abx        Author: Alberteen Sam, MD 10/19/2023 3:25 PM  For on call review [email or web address].

## 2023-10-19 NOTE — Plan of Care (Signed)
Problem: Health Behavior/Discharge Planning: Goal: Ability to manage health-related needs will improve Outcome: Progressing

## 2023-10-19 NOTE — Progress Notes (Signed)
The patient is requesting melatonin for sleep. He says Seroquel "knocks him out" too much. Also, he is requesting additional pain medication for a pain level of 8. He also stated he would like to start on Soboxone or equivalent to help with drug dependence withdrawal. Messaged Anthoney Harada.

## 2023-10-20 DIAGNOSIS — M71121 Other infective bursitis, right elbow: Secondary | ICD-10-CM | POA: Diagnosis not present

## 2023-10-20 LAB — HCV RNA QUANT: HCV Quantitative: NOT DETECTED [IU]/mL (ref >50)

## 2023-10-20 LAB — HEPATITIS B SURFACE ANTIBODY, QUANTITATIVE: Hep B S AB Quant (Post): 141 m[IU]/mL

## 2023-10-20 LAB — AEROBIC/ANAEROBIC CULTURE W GRAM STAIN (SURGICAL/DEEP WOUND)

## 2023-10-20 MED ORDER — LORATADINE 10 MG PO TABS
10.0000 mg | ORAL_TABLET | Freq: Once | ORAL | Status: AC
  Administered 2023-10-21: 10 mg via ORAL
  Filled 2023-10-20: qty 1

## 2023-10-20 NOTE — Progress Notes (Signed)
The patient is requesting Benadryl for itching. Messaged Anthoney Harada.

## 2023-10-20 NOTE — Progress Notes (Signed)
PROGRESS NOTE    Trevor [name]  UEA:[id number] DOB: 07-10-1981 DOA: 10/13/2023 PCP: Patient, No Pcp Per   Brief Narrative:  43 y.o. M with bipolary 1, IVDU, hx MSSA bacteremia 2 months ago in Nov 2024, negative TEE at that time, depression and recurrent SI, just discharged from [facility] 2 weeks PTA, who presented with right elbow swelling, redness and pain.  Patient admitted for cellulitis and abscess of the right elbow, olecranon osteomyelitis status post bursectomy and I&D of abscess on 10/17/2023.  Patient continues on broad-spectrum IV antibiotics, per ID will likely need 6 to 8 weeks of cefazolin.  He is unfortunately high risk for discharge with PICC line due to polysubstance abuse and bipolar disorder.  Unclear if patient will be able to discharge safely or willing to stay inpatient during his medication regiment.  At this time patient is medically stable for discharge pending safe dispo, he continues to request narcotics     Assessment & Plan:   Principal Problem:   Septic olecranon bursitis of right elbow Active Problems:   Cellulitis of right elbow   Hypokalemia   Bipolar 1 disorder (HCC)   Normocytic anemia   Polysubstance abuse (HCC)   History of intravenous drug abuse   Osteomyelitis of left arm (HCC)   Sepsis secondary to cellulitis and abscess of the right elbow, POA Olecranon osteomyelitis Status post right olecranon bursectomy and I&D of deep abscess 1/27 by Dr. Orlan Leavens - Sepsis secondary to elbow infection, tachypnea, hypothermia - Orthopedics were consulted and he was taken to the OR for bursectomy and I&D. - MRI findings showed possible osteomyelitis of the olecranon process, so ID recommended 6 weeks IV antibiotics. - Continue cefazolin for 6 to 8 weeks per ID - Outpatient Ortho and ID follow-up as scheduled (once stable for discharge) - Continue Ensure   Normocytic anemia - Hemoglobin has remained stable   Bipolar 1 disorder (HCC) - Seems to be  refusing the Seroquel that was started for him a few weeks ago in [facility] - Continue BuSpar, Lexapro, Seroquel   Hypokalemia - Supplemented and resolved   Polysubstance abuse - Avoid opiates/narcotics as appropriate -if truly intractable pain can initiate buprenorphine (resting comfortably no acute distress) - Acetaminophen, naproxen, Robaxin   DVT prophylaxis: SCD's Start: 10/17/23 1634 enoxaparin (LOVENOX) injection 40 mg Start: 10/13/23 2200   Code Status:   Code Status: Full Code  Family Communication: None present  Status is: Inpatient  Dispo: The patient is from: Home              Anticipated d/c is to: Home              Anticipated d/c date is: To be determined pending disposition planning, IV antibiotic completion in 6 to 8 weeks (3/9 or later)              Patient currently is medically stable for discharge, will need safe disposition in the setting of PICC line and substance abuse  Consultants:  Ortho, infectious disease  Procedures:  I&D 10/17/2023, right olecranon abscess and bursectomy  Antimicrobials:  Cefazolin, 6 to 8 weeks therapy planned  Subjective: No acute issues or events overnight denies nausea vomiting diarrhea constipation headache fevers chills or chest pain.  Resting comfortably but continues to request narcotics for "intractable pain"  Objective: Vitals:   10/19/23 1405 10/19/23 1754 10/19/23 2037 10/20/23 0612  BP: (!) 154/86 (!) 158/77 (!) 141/69 (!) 157/70  Pulse: 70 86 68 61  Resp: 18 16  18 18  Temp: 97.8 F (36.6 C) 97.8 F (36.6 C) (!) 97.4 F (36.3 C) 98.2 F (36.8 C)  TempSrc:   Oral Oral  SpO2: 100% 100% 98% 99%  Weight:      Height:        Intake/Output Summary (Last 24 hours) at 10/20/2023 0707 Last data filed at 10/20/2023 0700 Gross per 24 hour  Intake 1042.11 ml  Output 0 ml  Net 1042.11 ml   Filed Weights   10/13/23 0758 10/13/23 1233 10/17/23 1147  Weight: 87.1 kg 73.9 kg 74.8 kg    Examination:  General:   Pleasantly resting in bed, No acute distress. Lungs:  Clear to auscultate bilaterally without rhonchi, wheeze, or rales. Heart:  Regular rate and rhythm.  Without murmurs, rubs, or gallops. Abdomen:  Soft, nontender, nondistended.  Without guarding or rebound. Extremities: Right arm bandage clean dry intact, 1+ edema into the distal right hand  Data Reviewed: I have personally reviewed following labs and imaging studies  CBC: Recent Labs  Lab 10/13/23 0820 10/14/23 0003 10/17/23 0137 10/18/23 0339 10/19/23 0510  WBC 9.9 9.3 5.7 8.3 9.7  NEUTROABS 8.2*  --   --   --   --   HGB 11.9* 11.9* 11.4* 11.0* 11.5*  HCT 35.6* 36.5* 34.8* 33.7* 35.0*  MCV 89.0 90.1 90.4 89.9 90.7  PLT 245 229 314 336 365   Basic Metabolic Panel: Recent Labs  Lab 10/13/23 0820 10/14/23 0003 10/15/23 0010 10/17/23 0137 10/18/23 0339 10/19/23 0510  NA 134* 134*  --   --  133* 140  K 3.9 3.4*  --   --  3.9 3.8  CL 99 98  --   --  97* 102  CO2 26 26  --   --  28 29  GLUCOSE 110* 96  --   --  203* 103*  BUN 21* 20  --   --  23* 20  CREATININE 0.51* 0.82 0.64 0.51* 0.69 0.33*  CALCIUM 9.0 8.6*  --   --  8.6* 8.9   GFR: Estimated Creatinine Clearance: 127.3 mL/min (A) (by C-G formula based on SCr of 0.33 mg/dL (L)).  Liver Function Tests: Recent Labs  Lab 10/13/23 0820 10/14/23 0003  AST 38 27  ALT 29 25  ALKPHOS 51 48  BILITOT 0.5 0.4  PROT 7.2 6.6  ALBUMIN 3.7 3.1*   Coagulation Profile: Recent Labs  Lab 10/17/23 0137  INR 1.0   Recent Results (from the past 240 hours)  Blood culture (routine x 2)     Status: None   Collection Time: 10/13/23  8:20 AM   Specimen: BLOOD  Result Value Ref Range Status   Specimen Description   Final    BLOOD LEFT ANTECUBITAL Performed at [facility], [address] [place], [place] [address]    Special Requests   Final    BOTTLES DRAWN AEROBIC AND ANAEROBIC Blood Culture results may not be optimal due to an inadequate volume of  blood received in culture bottles Performed at [facility], [address], [place], [place] [address]    Culture   Final    NO GROWTH 5 DAYS Performed at [facility], [address], [place], [place] [address]    Report Status 10/18/2023 FINAL  Final  Blood culture (routine x 2)     Status: None   Collection Time: 10/13/23  8:25 AM   Specimen: BLOOD LEFT HAND  Result Value Ref Range Status  Specimen Description   Final    BLOOD LEFT HAND Performed at [facility], [address] [place], [place] [address]    Special Requests   Final    BOTTLES DRAWN AEROBIC AND ANAEROBIC Blood Culture results may not be optimal due to an inadequate volume of blood received in culture bottles Performed at [facility], [address] [place], [place] [address]    Culture   Final    NO GROWTH 5 DAYS Performed at [facility], [address], [place], [place] [address]    Report Status 10/18/2023 FINAL  Final  Aerobic/Anaerobic Culture w Gram Stain (surgical/deep wound)     Status: None (Preliminary result)   Collection Time: 10/15/23 11:13 AM   Specimen: Abscess  Result Value Ref Range Status   Specimen Description ABSCESS  Final   Special Requests right arm  Final   Gram Stain   Final    MODERATE WBC PRESENT, PREDOMINANTLY PMN RARE GRAM POSITIVE COCCI Performed at [facility], [address] [place], [place] [address]    Culture   Final    RARE STREPTOCOCCUS GROUP G Beta hemolytic streptococci are predictably susceptible to penicillin and other beta lactams. Susceptibility testing not routinely performed. NO ANAEROBES ISOLATED; CULTURE IN PROGRESS FOR 5 DAYS    Report Status PENDING  Incomplete  Surgical pcr screen     Status: None   Collection Time: 10/17/23 11:54 AM   Specimen: Nasal Mucosa; Nasal Swab  Result Value Ref Range Status   MRSA, PCR NEGATIVE NEGATIVE Final   Staphylococcus aureus NEGATIVE NEGATIVE Final     Comment: (NOTE) The Xpert SA Assay (FDA approved for NASAL specimens in patients 73 years of age and older), is one component of a comprehensive surveillance program. It is not intended to diagnose infection nor to guide or monitor treatment. Performed at [facility], [address] [place], [place] [address]   Aerobic/Anaerobic Culture w Gram Stain (surgical/deep wound)     Status: None (Preliminary result)   Collection Time: 10/17/23  1:45 PM   Specimen: Path fluid; Body Fluid  Result Value Ref Range Status   Specimen Description ABSCESS  Final   Special Requests RT ELBOW PT ON VANC ANCEF  Final   Gram Stain NO WBC SEEN NO ORGANISMS SEEN   Final   Culture   Final    NO GROWTH 2 DAYS NO ANAEROBES ISOLATED; CULTURE IN PROGRESS FOR 5 DAYS Performed at [facility], [address] [place], [place] [address]    Report Status PENDING  Incomplete  Aerobic/Anaerobic Culture w Gram Stain (surgical/deep wound)     Status: None (Preliminary result)   Collection Time: 10/17/23  1:45 PM   Specimen: Path Tissue  Result Value Ref Range Status   Specimen Description TISSUE  Final   Special Requests RT ELBOW PT ON ANCEF VANC  Final   Gram Stain   Final    RARE WBC PRESENT, PREDOMINANTLY PMN NO ORGANISMS SEEN Performed at [facility], [address] [place], [place] [address]    Culture   Final    RARE STREPTOCOCCUS GROUP G Beta hemolytic streptococci are predictably susceptible to penicillin and other beta lactams. Susceptibility testing not routinely performed. NO ANAEROBES ISOLATED; CULTURE IN PROGRESS FOR 5 DAYS    Report Status PENDING  Incomplete         Radiology Studies: No results found.  Scheduled Meds:  amLODipine  5 mg Oral Daily   busPIRone  7.5 mg Oral TID   Chlorhexidine Gluconate Cloth  6 each Topical Daily   enoxaparin (LOVENOX) injection  40 mg Subcutaneous Q24H   escitalopram  10 mg Oral Daily   feeding supplement  237 mL  Oral BID BM   nicotine  14 mg Transdermal Daily   QUEtiapine  200 mg Oral QHS   sodium chloride flush  10-40 mL Intracatheter Q12H   Continuous Infusions:   ceFAZolin (ANCEF) IV 2 g (10/20/23 8119)     LOS: 7 days   Time spent:  Azucena Fallen, DO [facility]  If 7PM-7AM, please contact night-coverage [email or web address]  10/20/2023, 7:07 AM

## 2023-10-20 NOTE — Plan of Care (Signed)
Problem: Skin Integrity: Goal: Skin integrity will improve Outcome: Progressing

## 2023-10-20 NOTE — Progress Notes (Signed)
The patient is requesting medication for anxiety and is getting upset and cursing, stating it is ridiculous that he is not allowed medication for anxiety. Messaged Anthoney Harada.

## 2023-10-21 ENCOUNTER — Inpatient Hospital Stay: Payer: BLUE CROSS/BLUE SHIELD | Admitting: Nurse Practitioner

## 2023-10-21 ENCOUNTER — Other Ambulatory Visit (HOSPITAL_COMMUNITY): Payer: Self-pay

## 2023-10-21 DIAGNOSIS — M8618 Other acute osteomyelitis, other site: Secondary | ICD-10-CM

## 2023-10-21 DIAGNOSIS — F419 Anxiety disorder, unspecified: Secondary | ICD-10-CM | POA: Diagnosis not present

## 2023-10-21 DIAGNOSIS — F1520 Other stimulant dependence, uncomplicated: Secondary | ICD-10-CM | POA: Diagnosis present

## 2023-10-21 DIAGNOSIS — F1910 Other psychoactive substance abuse, uncomplicated: Secondary | ICD-10-CM | POA: Diagnosis not present

## 2023-10-21 DIAGNOSIS — M869 Osteomyelitis, unspecified: Secondary | ICD-10-CM | POA: Diagnosis not present

## 2023-10-21 DIAGNOSIS — L03113 Cellulitis of right upper limb: Secondary | ICD-10-CM | POA: Diagnosis not present

## 2023-10-21 DIAGNOSIS — M71121 Other infective bursitis, right elbow: Secondary | ICD-10-CM | POA: Diagnosis not present

## 2023-10-21 DIAGNOSIS — Z87898 Personal history of other specified conditions: Secondary | ICD-10-CM | POA: Diagnosis not present

## 2023-10-21 MED ORDER — DIPHENHYDRAMINE HCL 50 MG/ML IJ SOLN
25.0000 mg | Freq: Once | INTRAMUSCULAR | Status: DC | PRN
  Filled 2023-10-21: qty 1

## 2023-10-21 MED ORDER — EPINEPHRINE 0.3 MG/0.3ML IJ SOAJ
0.3000 mg | Freq: Once | INTRAMUSCULAR | Status: DC | PRN
  Filled 2023-10-21: qty 0.3

## 2023-10-21 MED ORDER — LORAZEPAM 2 MG/ML IJ SOLN
2.0000 mg | Freq: Once | INTRAMUSCULAR | Status: AC
  Administered 2023-10-21: 2 mg via INTRAVENOUS
  Filled 2023-10-21: qty 1

## 2023-10-21 MED ORDER — OLANZAPINE 5 MG PO TABS
5.0000 mg | ORAL_TABLET | Freq: Once | ORAL | Status: AC
  Administered 2023-10-21: 5 mg via ORAL
  Filled 2023-10-21: qty 1

## 2023-10-21 MED ORDER — CEFADROXIL 500 MG PO CAPS
500.0000 mg | ORAL_CAPSULE | Freq: Once | ORAL | Status: AC
Start: 2023-10-21 — End: 2023-10-21
  Administered 2023-10-21: 500 mg via ORAL
  Filled 2023-10-21: qty 1

## 2023-10-21 MED ORDER — DOXEPIN HCL 10 MG PO CAPS
10.0000 mg | ORAL_CAPSULE | Freq: Every day | ORAL | Status: DC
  Administered 2023-10-21: 10 mg via ORAL
  Filled 2023-10-21: qty 1

## 2023-10-21 NOTE — Progress Notes (Signed)
PROGRESS NOTE    Trevor [name]  EXB:[id number] DOB: 1981-08-10 DOA: 10/13/2023 PCP: Patient, No Pcp Per   Brief Narrative:  43 y.o. M with bipolary 1, IVDU, hx MSSA bacteremia 2 months ago in Nov 2024, negative TEE at that time, depression and recurrent SI, just discharged from [facility] 2 weeks PTA, who presented with right elbow swelling, redness and pain.  Patient admitted for cellulitis and abscess of the right elbow, olecranon osteomyelitis status post bursectomy and I&D of abscess on 10/17/2023.  Patient continues on broad-spectrum IV antibiotics, per ID will likely need 6 to 8 weeks of cefazolin.  He is unfortunately high risk for discharge with PICC line due to polysubstance abuse and bipolar disorder.  Unclear if patient will be able to discharge safely or willing to stay inpatient during his medication regiment.  At this time patient is medically stable for discharge pending safe dispo, he continues to request narcotics   1/31: Patient was quite agitated today stating that he has not slept in the past 2 nights and losing his mind.  Psych was consulted as he has an history of bipolar disorder.  Assessment & Plan:   Principal Problem:   Septic olecranon bursitis of right elbow Active Problems:   Cellulitis of right elbow   Hypokalemia   Bipolar 1 disorder (HCC)   Normocytic anemia   Polysubstance abuse (HCC)   History of intravenous drug abuse   Osteomyelitis of left arm (HCC)   Methamphetamine dependence (HCC)   Sepsis secondary to cellulitis and abscess of the right elbow, POA Olecranon osteomyelitis Status post right olecranon bursectomy and I&D of deep abscess 1/27 by Dr. Orlan Leavens - Sepsis secondary to elbow infection, tachypnea, hypothermia - Orthopedics were consulted and he was taken to the OR for bursectomy and I&D. - MRI findings showed possible osteomyelitis of the olecranon process, so ID recommended 6 weeks IV antibiotics. - Continue cefazolin for 6 to 8 weeks  per ID - Outpatient Ortho and ID follow-up as scheduled (once stable for discharge) - Continue Ensure   Normocytic anemia - Hemoglobin has remained stable   Bipolar 1 disorder (HCC) - Seems to be refusing the Seroquel that was started for him a few weeks ago in [facility] - Continue BuSpar, Lexapro, Seroquel -Psych was consulted as due to concern of precipitating manic episode with his history of bipolar disorder   Hypokalemia - Supplemented and resolved   Polysubstance abuse - Avoid opiates/narcotics as appropriate -if truly intractable pain can initiate buprenorphine (resting comfortably no acute distress) - Acetaminophen, naproxen, Robaxin   DVT prophylaxis: SCD's Start: 10/17/23 1634 enoxaparin (LOVENOX) injection 40 mg Start: 10/13/23 2200   Code Status:   Code Status: Full Code  Family Communication: None present  Status is: Inpatient  Dispo: The patient is from: Home              Anticipated d/c is to: Home              Anticipated d/c date is: To be determined pending disposition planning, IV antibiotic completion in 6 to 8 weeks (3/9 or later)              Patient currently is medically stable for discharge, will need safe disposition in the setting of PICC line and substance abuse  Consultants:  Ortho, infectious disease  Procedures:  I&D 10/17/2023, right olecranon abscess and bursectomy  Antimicrobials:  Cefazolin, 6 to 8 weeks therapy planned  Subjective: Patient was quite irritable and keeps saying  that I am losing my mind.  Unable to sleep for the past 2 nights.  Asking for help with psych.  Objective: Vitals:   10/21/23 1200 10/21/23 1215 10/21/23 1230 10/21/23 1245  BP: 138/72 (!) 140/69 (!) 144/82 136/78  Pulse: 75 79 81   Resp:      Temp:      TempSrc:      SpO2: 97% 99% 99%   Weight:      Height:        Intake/Output Summary (Last 24 hours) at 10/21/2023 1522 Last data filed at 10/21/2023 0246 Gross per 24 hour  Intake 480 ml  Output 1 ml   Net 479 ml   Filed Weights   10/13/23 0758 10/13/23 1233 10/17/23 1147  Weight: 87.1 kg 73.9 kg 74.8 kg    Examination:  General.  Well-developed gentleman, in no acute distress. Pulmonary.  Lungs clear bilaterally, normal respiratory effort. CV.  Regular rate and rhythm, no JVD, rub or murmur. Abdomen.  Soft, nontender, nondistended, BS positive. CNS.  Alert and oriented .  No focal neurologic deficit. Extremities.  No edema, no cyanosis, pulses intact and symmetrical.  Data Reviewed: I have personally reviewed following labs and imaging studies  CBC: Recent Labs  Lab 10/17/23 0137 10/18/23 0339 10/19/23 0510  WBC 5.7 8.3 9.7  HGB 11.4* 11.0* 11.5*  HCT 34.8* 33.7* 35.0*  MCV 90.4 89.9 90.7  PLT 314 336 365   Basic Metabolic Panel: Recent Labs  Lab 10/15/23 0010 10/17/23 0137 10/18/23 0339 10/19/23 0510  NA  --   --  133* 140  K  --   --  3.9 3.8  CL  --   --  97* 102  CO2  --   --  28 29  GLUCOSE  --   --  203* 103*  BUN  --   --  23* 20  CREATININE 0.64 0.51* 0.69 0.33*  CALCIUM  --   --  8.6* 8.9   GFR: Estimated Creatinine Clearance: 127.3 mL/min (A) (by C-G formula based on SCr of 0.33 mg/dL (L)).  Liver Function Tests: No results for input(s): "AST", "ALT", "ALKPHOS", "BILITOT", "PROT", "ALBUMIN" in the last 168 hours.  Coagulation Profile: Recent Labs  Lab 10/17/23 0137  INR 1.0   Recent Results (from the past 240 hours)  Blood culture (routine x 2)     Status: None   Collection Time: 10/13/23  8:20 AM   Specimen: BLOOD  Result Value Ref Range Status   Specimen Description   Final    BLOOD LEFT ANTECUBITAL Performed at [facility], [address], [place], [place] [address]    Special Requests   Final    BOTTLES DRAWN AEROBIC AND ANAEROBIC Blood Culture results may not be optimal due to an inadequate volume of blood received in culture bottles Performed at [facility], [address]  [place], [place] [address]    Culture   Final    NO GROWTH 5 DAYS Performed at [facility], [address] [name], [place] [address]    Report Status 10/18/2023 FINAL  Final  Blood culture (routine x 2)     Status: None   Collection Time: 10/13/23  8:25 AM   Specimen: BLOOD LEFT HAND  Result Value Ref Range Status   Specimen Description   Final    BLOOD LEFT HAND Performed at [facility], [address] [place], [place] [address]  Special Requests   Final    BOTTLES DRAWN AEROBIC AND ANAEROBIC Blood Culture results may not be optimal due to an inadequate volume of blood received in culture bottles Performed at [facility], [address], [place], [place] [address]    Culture   Final    NO GROWTH 5 DAYS Performed at [facility], [address] [place], [place] [address]    Report Status 10/18/2023 FINAL  Final  Aerobic/Anaerobic Culture w Gram Stain (surgical/deep wound)     Status: None   Collection Time: 10/15/23 11:13 AM   Specimen: Abscess  Result Value Ref Range Status   Specimen Description ABSCESS  Final   Special Requests right arm  Final   Gram Stain   Final    MODERATE WBC PRESENT, PREDOMINANTLY PMN RARE GRAM POSITIVE COCCI    Culture   Final    RARE STREPTOCOCCUS GROUP G Beta hemolytic streptococci are predictably susceptible to penicillin and other beta lactams. Susceptibility testing not routinely performed. NO ANAEROBES ISOLATED Performed at [facility], [address], [place], [place] [address]    Report Status 10/20/2023 FINAL  Final  Surgical pcr screen     Status: None   Collection Time: 10/17/23 11:54 AM   Specimen: Nasal Mucosa; Nasal Swab  Result Value Ref Range Status   MRSA, PCR NEGATIVE NEGATIVE Final   Staphylococcus aureus NEGATIVE NEGATIVE Final    Comment: (NOTE) The Xpert SA Assay (FDA approved for NASAL specimens in patients 70 years of age and older), is one component of a  comprehensive surveillance program. It is not intended to diagnose infection nor to guide or monitor treatment. Performed at [facility], [address] [place] [address], [place], [place] [address]   Aerobic/Anaerobic Culture w Gram Stain (surgical/deep wound)     Status: None (Preliminary result)   Collection Time: 10/17/23  1:45 PM   Specimen: Path fluid; Body Fluid  Result Value Ref Range Status   Specimen Description ABSCESS  Final   Special Requests RT ELBOW PT ON VANC ANCEF  Final   Gram Stain NO WBC SEEN NO ORGANISMS SEEN   Final   Culture   Final    NO GROWTH 4 DAYS NO ANAEROBES ISOLATED; CULTURE IN PROGRESS FOR 5 DAYS Performed at [facility], [address], [place], [place] [address]    Report Status PENDING  Incomplete  Aerobic/Anaerobic Culture w Gram Stain (surgical/deep wound)     Status: None (Preliminary result)   Collection Time: 10/17/23  1:45 PM   Specimen: Path Tissue  Result Value Ref Range Status   Specimen Description TISSUE  Final   Special Requests RT ELBOW PT ON ANCEF VANC  Final   Gram Stain   Final    RARE WBC PRESENT, PREDOMINANTLY PMN NO ORGANISMS SEEN Performed at [facility], [address] [place], [place] [address]    Culture   Final    RARE STREPTOCOCCUS GROUP G Beta hemolytic streptococci are predictably susceptible to penicillin and other beta lactams. Susceptibility testing not routinely performed. NO ANAEROBES ISOLATED; CULTURE IN PROGRESS FOR 5 DAYS    Report Status PENDING  Incomplete         Radiology Studies: No results found.      Scheduled Meds:  amLODipine  5 mg Oral Daily   busPIRone  7.5 mg Oral TID   Chlorhexidine Gluconate Cloth  6 each Topical Daily   enoxaparin (LOVENOX) injection  40  mg Subcutaneous Q24H   escitalopram  10 mg Oral Daily   feeding supplement  237 mL Oral BID BM   nicotine  14 mg Transdermal Daily   sodium chloride flush  10-40 mL Intracatheter Q12H   Continuous Infusions:    ceFAZolin (ANCEF) IV 2 g (10/21/23 1302)     LOS: 8 days   Time spent: 50 min  Arnetha Courser, MD [facility]  If 7PM-7AM, please contact night-coverage [email or web address]  10/21/2023, 3:22 PM
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
## 2023-10-21 NOTE — Consult Note (Signed)
[facility] Psychiatric Consult Initial  Patient Name: .Sartaj Hoskin  MRN: [id number]  DOB: [birth year]  Consult Order details:  Orders (From admission, onward)     Start     Ordered   10/21/23 1010  IP CONSULT TO PSYCHIATRY       Ordering Provider: Arnetha Courser, MD  Provider:  (Not yet assigned)  Question Answer Comment  Location [facility]   Reason for Consult? H/O Bipolar, looks like going into manic episode      10/21/23 1010             Mode of Visit: In person    Psychiatry Consult Evaluation  Service Date: October 21, 2023 LOS:  LOS: 8 days  Chief Complaint "I get violent when I get manicky and nothing clams me down.  I get therr and can't come down. I don't want to be there."  Primary Psychiatric Diagnoses  Methamphetamine dependence 2.  Anxiety  Assessment  Arav Bannister is a 43 y.o. male admitted: Medicallyfor 10/13/2023  7:49 AM for bacteriemia infection. He carries the psychiatric diagnoses of methamphetamine dependence (IV and smoking) and has a past medical history of  bacteriemia, osteomyelitis or left arm, septic bursitis of right elbow, and anemia   His current presentation of restlessness complaints and feeling panicky is most consistent with anxiety  Current outpatient psychotropic medications include gabapentin, hydroxyzine, Buspar, Lexapro, and Seroquel 100 mg and historically he has had a partial response to these medications. He was none compliant with medications prior to admission as evidenced by client report. On initial examination, patient was irritable and complained of anxiety and depression. Please see plan below for detailed recommendations.   Diagnoses:  Active Hospital problems: Principal Problem:   Septic olecranon bursitis of right elbow Active Problems:   Cellulitis of right elbow   Hypokalemia   Bipolar 1 disorder (HCC)   Normocytic anemia   Polysubstance abuse (HCC)   History of intravenous drug  abuse   Osteomyelitis of left arm (HCC)   Methamphetamine dependence (HCC)    Plan   ## Psychiatric Medication Recommendations:  -Zyprexa 5 mg BID -Doxepine 10 mg at bedtime for sleep Recommended rehab, he declined.  Does not warrant an inpatient hospitalization at this time.  ## Medical Decision Making Capacity: Not specifically addressed in this encounter  ## Further Work-up:  -- EKG ordered -- most recent EKG ordered -- Pertinent labwork reviewed earlier this admission includes: CMP, scans   ## Disposition:-- There are no psychiatric contraindications to discharge at this time  ## Behavioral / Environmental: - No specific recommendations at this time.     ## Safety and Observation Level:  - Based on my clinical evaluation, I estimate the patient to be at low risk of self harm in the current setting. - At this time, we recommend  routine. This decision is based on my review of the chart including patient's history and current presentation, interview of the patient, mental status examination, and consideration of suicide risk including evaluating suicidal ideation, plan, intent, suicidal or self-harm behaviors, risk factors, and protective factors. This judgment is based on our ability to directly address suicide risk, implement suicide prevention strategies, and develop a safety plan while the patient is in the clinical setting. Please contact our team if there is a concern that risk level has changed.  CSSR Risk Category:C-SSRS RISK CATEGORY: No Risk  Suicide Risk Assessment: Patient has following modifiable risk factors for suicide: under treated depression ,  which we are addressing by starting a mood stabilizer. Patient has following non-modifiable or demographic risk factors for suicide: male gender Patient has the following protective factors against suicide: Access to outpatient mental health care, Supportive friends, and no history of suicide attempts  Thank you for this  consult request. Recommendations have been communicated to the primary team.  We will continue to follow at this time.   Nanine Means, NP       History of Present Illness  Relevant Aspects of [facility] Course:  Admitted on 10/13/2023 for bacteriemia. They are receiving IV antibiotics.   Patient Report:  The client reports he is restless and having depression, moderate level.  He has chronic passive suicidal ideations "all the time", no plan or intent, denies past suicide attempts.  On assessment, he voiced he was having a panic attack despite lying calmly in bed with no outward signs.  He described himself with "manicky episodes" where he gets violent and calm himself.  During these periods he does not have a lack of sleep, increase in energy, grandiosity, or other manic symptoms.  Denies hallucinations, paranoia, homicidal ideations.  He was using meth via IV or smoking daily prior to admission, denies any withdrawal symptoms.  Discussed rehab which he declined as "it is not conducive for me, not the right environment".  Appetite is "alright", sleep is poor.  Discussed several medications for anxiety and insomnia to which he replied, "I've tried every".  Failed trials of propranolol, Busapr, Lexapro, hydroxyzine, gabapentin, Trazodone, and Seroquel.  He was not interested in propranolol.  Discussed Zyprexa for his mood, sleep, and anxiety to which he was agreeable.  He specifically requested Doxepin for sleep as well.  Evidently, this does help him sleep.  Psych ROS:  Depression: moderate to high Anxiety:  high Mania (lifetime and current): "manicky episodes" that don't reflect cristeria for bipolar d/o Psychosis: (lifetime and current): occasionally when using meth  Review of Systems  Psychiatric/Behavioral:  Positive for depression and substance abuse. The patient is nervous/anxious.   All other systems reviewed and are negative.    Psychiatric and Social History  Psychiatric  History:  Information collected from patient and chart.  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 Hx: denies Living Situation: lives with his friend  Access to weapons/lethal means: none   Substance History Alcohol: denies  History of alcohol withdrawal seizures none History of DT's none Tobacco: yes Illicit drugs: meth via smoking and IV, cannabis via smoking Prescription drug abuse: denies Rehab hx: Yes  Exam Findings  Physical Exam: some pain on his infected areas Vital Signs:  Temp:  [97.8 F (36.6 C)-98.3 F (36.8 C)] 98.3 F (36.8 C) (01/31 0601) Pulse Rate:  [62-81] 81 (01/31 1230) Resp:  [15-18] 17 (01/31 1130) BP: (136-160)/(68-89) 136/78 (01/31 1245) SpO2:  [97 %-100 %] 99 % (01/31 1230) Blood pressure 136/78, pulse 81, temperature 98.3 F (36.8 C), temperature source Oral, resp. rate 17, height 6' (1.829 m), weight 74.8 kg, SpO2 99%. Body mass index is 22.38 kg/m.  Physical Exam Vitals and nursing note reviewed.  Constitutional:      Appearance: Normal appearance.  HENT:  Head: Normocephalic.     Nose: Nose normal.  Pulmonary:     Effort: Pulmonary effort is normal.  Musculoskeletal:        General: Normal range of motion.     Cervical back: Normal range of motion.  Neurological:     Mental Status: He is alert.     Mental Status Exam: General Appearance: Disheveled  Orientation:  Full (Time, Place, and Person)  Memory:  Immediate;   Fair Recent;   Fair Remote;   Fair  Concentration:  Concentration: Fair and Attention Span: Fair   Recall:  Good  Attention  Fair  Eye Contact:  Fair  Speech:  Normal Rate  Language:  Good  Volume:  Normal  Mood: depressed, anxious  Affect:  Congruent  Thought Process:  Coherent  Thought Content:  Logical  Suicidal Thoughts:  No  Homicidal Thoughts:  No  Judgement:  Fair  Insight:  Fair  Psychomotor Activity:  Decreased  Akathisia:  No  Fund of Knowledge:  Fair      Assets:  Housing Leisure Time Resilience  Cognition:  WNL  ADL's:  Intact  AIMS (if indicated):        Other History   These have been pulled in through the EMR, reviewed, and updated if appropriate.  Family History:  The patient's family history is not on file.  Medical History: Past Medical History:  Diagnosis Date   MRSA infection    Substance abuse (HCC)     Surgical History: Past Surgical History:  Procedure Laterality Date   BACK SURGERY     IRRIGATION AND DEBRIDEMENT ELBOW Right 10/17/2023   Procedure: IRRIGATION AND DEBRIDEMENT ELBOW;  Surgeon: Bradly Bienenstock, MD;  Location: [facility] OR;  Service: Orthopedics;  Laterality: Right;   LUMBAR FUSION     MEDIAL COLLATERAL LIGAMENT AND LATERAL COLLATERAL LIGAMENT REPAIR, KNEE Right      Medications:   Current Facility-Administered Medications:    acetaminophen (TYLENOL) tablet 650 mg, 650 mg, Oral, Q6H PRN, 650 mg at 10/21/23 1259 **OR** acetaminophen (TYLENOL) suppository 650 mg, 650 mg, Rectal, Q6H PRN, Bobette Mo, MD   amLODipine (NORVASC) tablet 5 mg, 5 mg, Oral, Daily, Danford, Earl Lites, MD, 5 mg at 10/21/23 1015   busPIRone (BUSPAR) tablet 7.5 mg, 7.5 mg, Oral, TID, Bobette Mo, MD, 7.5 mg at 10/21/23 1015   ceFAZolin (ANCEF) IVPB 2g/100 mL premix, 2 g, Intravenous, Q8H, Daiva Eves, Lisette Grinder, MD, Last Rate: 200 mL/hr at 10/21/23 1302, 2 g at 10/21/23 1302   Chlorhexidine Gluconate Cloth 2 % PADS 6 each, 6 each, Topical, Daily, Bobette Mo, MD, 6 each at 10/21/23 1016   diphenhydrAMINE (BENADRYL) injection 25 mg,  25 mg, Intravenous, Once PRN, Daiva Eves, Lisette Grinder, MD   enoxaparin (LOVENOX) injection 40 mg, 40 mg, Subcutaneous, Q24H, Bobette Mo, MD, 40 mg at 10/20/23 2309   EPINEPHrine (EPI-PEN) injection 0.3 mg, 0.3 mg, Intramuscular, Once PRN, Daiva Eves, Lisette Grinder, MD   escitalopram (LEXAPRO) tablet 10 mg, 10 mg, Oral, Daily, Bobette Mo, MD, 10 mg at 10/21/23 1015   feeding supplement (ENSURE ENLIVE / ENSURE PLUS) liquid 237 mL, 237 mL, Oral, BID BM, Danford, Christopher P, MD, 237 mL at 10/21/23 1018   hydrOXYzine (ATARAX) tablet 50 mg, 50 mg, Oral, Q6H PRN, Danford, Earl Lites, MD, 50 mg at 10/21/23 1259   melatonin tablet 10 mg, 10 mg, Oral, QHS PRN, Anthoney Harada, NP, 10 mg at 10/20/23 2044   methocarbamol (  ROBAXIN) tablet 750 mg, 750 mg, Oral, Q6H PRN, Danford, Earl Lites, MD, 750 mg at 10/21/23 1259   naproxen (NAPROSYN) tablet 500 mg, 500 mg, Oral, TID PRN, Anthoney Harada, NP, 500 mg at 10/21/23 1015   nicotine (NICODERM CQ - dosed in mg/24 hours) patch 14 mg, 14 mg, Transdermal, Daily, Bobette Mo, MD, 14 mg at 10/21/23 1017   ondansetron (ZOFRAN) tablet 4 mg, 4 mg, Oral, Q6H PRN **OR** ondansetron (ZOFRAN) injection 4 mg, 4 mg, Intravenous, Q6H PRN, Bobette Mo, MD   Oral care mouth rinse, 15 mL, Mouth Rinse, PRN, Danford, Earl Lites, MD   sodium chloride flush (NS) 0.9 % injection 10-40 mL, 10-40 mL, Intracatheter, Q12H, Bobette Mo, MD, 10 mL at 10/21/23 1016   sodium chloride flush (NS) 0.9 % injection 10-40 mL, 10-40 mL, Intracatheter, PRN, Bobette Mo, MD  Allergies: Allergies  Allergen Reactions   Augmentin [Amoxicillin-Pot Clavulanate] Hives   Penicillins     Nanine Means, NP

## 2023-10-21 NOTE — Progress Notes (Signed)
The patient removed his ACE wrap, kerlix, and splint and stated "It was bothering me". I taught the patient the importance of leaving these items in place, but the patient responded with "It will be o.k.".

## 2023-10-21 NOTE — Progress Notes (Addendum)
Pharmacy Note- Penicillin Allergy Clarification   ASSESSMENT:  The patient describes a childhood allergy of PCN as throat swelling that was told to him by his mother. Given the time out from this exposure and need for an oral treatment option - the patient is willing to proceed with an oral cefadroxil challenge while inpatient to see how it is tolerated. There is a risk of cross-reactivity - this was explained to the patient. Understanding his tolerance of first generation cephalosporins will help guide his stepdown to orals and might help facilitate an earlier discharge plan.  PEN-FAST Scoring   Five years or less since last reaction 0  Anaphylaxis/Angioedema OR Severe cutaneous adverse reaction  0-2 (patient states some throat swelling reported to him by his mother)  Treatment required for reaction  0-1 (patient does not recall)  Total Score 0-3 (patient and MD understand risk and are willing to proceed)    Type of intervention: Cefadroxil oral challenge - will follow-up on tolerance later this afternoon.  PLAN: - cefadroxil 500 mg po x 1 dose - Q23min vitals monitoring x 1 hour  - Epi-Pen PRN  - IV diphenhydramine PRN  - plan communicated to primary RN Victorino Dike)  Addendum: Per discussion with the patient's RN, the oral cefadroxil was tolerated without issues. It can be assumed that this patient likely can tolerate most oral cephalosporins.   Thank you for allowing pharmacy to be a part of this patient's care.  Georgina Pillion, PharmD, BCPS, BCIDP Infectious Diseases Clinical Pharmacist 10/21/2023 10:30 AM   **Pharmacist phone directory can now be found on [email or web address] ([email or web address]).  Listed under [facility].

## 2023-10-22 DIAGNOSIS — F1520 Other stimulant dependence, uncomplicated: Secondary | ICD-10-CM | POA: Diagnosis not present

## 2023-10-22 DIAGNOSIS — F419 Anxiety disorder, unspecified: Secondary | ICD-10-CM

## 2023-10-22 DIAGNOSIS — M71121 Other infective bursitis, right elbow: Secondary | ICD-10-CM | POA: Diagnosis not present

## 2023-10-22 LAB — AEROBIC/ANAEROBIC CULTURE W GRAM STAIN (SURGICAL/DEEP WOUND)
Culture: NO GROWTH
Gram Stain: NONE SEEN

## 2023-10-22 MED ORDER — DOXEPIN HCL 25 MG PO CAPS
25.0000 mg | ORAL_CAPSULE | Freq: Every day | ORAL | Status: DC
  Administered 2023-10-22 – 2023-10-25 (×4): 25 mg via ORAL
  Filled 2023-10-22 (×4): qty 1

## 2023-10-22 MED ORDER — HYDRALAZINE HCL 20 MG/ML IJ SOLN: 10.0000 mg | INTRAMUSCULAR | Status: DC | PRN

## 2023-10-22 MED ORDER — OLANZAPINE 5 MG PO TABS
5.0000 mg | ORAL_TABLET | Freq: Two times a day (BID) | ORAL | Status: DC
  Administered 2023-10-22 – 2023-10-25 (×8): 5 mg via ORAL
  Filled 2023-10-22 (×9): qty 1

## 2023-10-22 MED ORDER — METOPROLOL TARTRATE 5 MG/5ML IV SOLN: 5.0000 mg | INTRAVENOUS | Status: DC | PRN

## 2023-10-22 NOTE — Progress Notes (Signed)
PROGRESS NOTE    Trevor [name]  ZOX:[id number] DOB: 1981-03-26 DOA: 10/13/2023 PCP: Patient, No Pcp Per    Brief Narrative:  43 year old with history of bipolar 1, IV drug abuse, history of MSSA bacteremia 2 months ago negative TEE, depression, recurrent SI discharged from behavioral health 2 weeks prior to admission now presents with right elbow swelling and pain.  Upon admission noted to have cellulitis and abscess.  Underwent [facility]&D by [facility] on 1/27 and started on IV antibiotics per ID.  At this time given his prior history of drug abuse and psych illness, safe disposition may be an issue.   Assessment & Plan:  Principal Problem:   Septic olecranon bursitis of right elbow Active Problems:   Cellulitis of right elbow   Hypokalemia   Normocytic anemia   Polysubstance abuse (HCC)   History of intravenous drug abuse   Osteomyelitis of left arm (HCC)   Methamphetamine dependence (HCC)      Sepsis secondary to cellulitis and abscess of the right elbow, POA Olecranon osteomyelitis -Status post right olecranon bursectomy and I&D of deep abscess 1/27 by Dr. Orlan Leavens.  Culture data has been reviewed.  Cultures are growing group G strep. - ID to identify long-term antibiotics.  Essentially patient will need about 6 weeks  Normocytic anemia - Hemoglobin has remained stable   Bipolar 1 disorder (HCC) Management per psychiatry.  Currently on Zyprexa twice daily, BuSpar, Lexapro, doxepin 10 mg at bedtime.  Defer further workup to their service.  No inpatient psych  needs identified per their service    Hypokalemia - Supplemented and resolved   Polysubstance abuse - Avoid opiates/narcotics as appropriate -if truly intractable pain can initiate buprenorphine (resting comfortably no acute distress) - Acetaminophen, naproxen, Robaxin   DVT prophylaxis:Lovenox    Code Status: Full Code Family Communication:   Status is: Inpatient Remains inpatient appropriate because:  Cont hosp stay for management of psych issues and Abx needed.     Subjective:  Doing okay no complaints at this time.  Examination:  General exam: Appears calm and comfortable  Respiratory system: Clear to auscultation. Respiratory effort normal. Cardiovascular system: S1 & S2 heard, RRR. No JVD, murmurs, rubs, gallops or clicks. No pedal edema. Gastrointestinal system: Abdomen is nondistended, soft and nontender. No organomegaly or masses felt. Normal bowel sounds heard. Central nervous system: Alert and oriented. No focal neurological deficits. Extremities: Symmetric 5 x 5 power. Skin: No rashes, lesions or ulcers Psychiatry: Judgement and insight appear normal. Mood & affect appropriate.                Diet Orders (From admission, onward)     Start     Ordered   10/17/23 1634  Diet regular Room service appropriate? Yes; Fluid consistency: Thin  Diet effective now       Question Answer Comment  Room service appropriate? Yes   Fluid consistency: Thin      10/17/23 1633            Objective: Vitals:   10/21/23 1230 10/21/23 1245 10/21/23 2239 10/22/23 0525  BP: (!) 144/82 136/78 (!) 109/58 (!) 171/54  Pulse: 81  64 63  Resp:   17 17  Temp:   (!) 97.5 F (36.4 C) 97.8 F (36.6 C)  TempSrc:   Oral   SpO2: 99%  99% 99%  Weight:      Height:       No intake or output data in the 24 hours ending 10/22/23  1059 Filed Weights   10/13/23 0758 10/13/23 1233 10/17/23 1147  Weight: 87.1 kg 73.9 kg 74.8 kg    Scheduled Meds:  amLODipine  5 mg Oral Daily   busPIRone  7.5 mg Oral TID   Chlorhexidine Gluconate Cloth  6 each Topical Daily   doxepin  25 mg Oral QHS   enoxaparin (LOVENOX) injection  40 mg Subcutaneous Q24H   escitalopram  10 mg Oral Daily   feeding supplement  237 mL Oral BID BM   nicotine  14 mg Transdermal Daily   OLANZapine  5 mg Oral BID   sodium chloride flush  10-40 mL Intracatheter Q12H   Continuous Infusions:   ceFAZolin (ANCEF) IV  2 g (10/22/23 0646)    Nutritional status     Body mass index is 22.38 kg/m.  Data Reviewed:   CBC: Recent Labs  Lab 10/17/23 0137 10/18/23 0339 10/19/23 0510  WBC 5.7 8.3 9.7  HGB 11.4* 11.0* 11.5*  HCT 34.8* 33.7* 35.0*  MCV 90.4 89.9 90.7  PLT 314 336 365   Basic Metabolic Panel: Recent Labs  Lab 10/17/23 0137 10/18/23 0339 10/19/23 0510  NA  --  133* 140  K  --  3.9 3.8  CL  --  97* 102  CO2  --  28 29  GLUCOSE  --  203* 103*  BUN  --  23* 20  CREATININE 0.51* 0.69 0.33*  CALCIUM  --  8.6* 8.9   GFR: Estimated Creatinine Clearance: 127.3 mL/min (A) (by C-G formula based on SCr of 0.33 mg/dL (L)). Liver Function Tests: No results for input(s): "AST", "ALT", "ALKPHOS", "BILITOT", "PROT", "ALBUMIN" in the last 168 hours. No results for input(s): "LIPASE", "AMYLASE" in the last 168 hours. No results for input(s): "AMMONIA" in the last 168 hours. Coagulation Profile: Recent Labs  Lab 10/17/23 0137  INR 1.0   Cardiac Enzymes: No results for input(s): "CKTOTAL", "CKMB", "CKMBINDEX", "TROPONINI" in the last 168 hours. BNP (last 3 results) No results for input(s): "PROBNP" in the last 8760 hours. HbA1C: No results for input(s): "HGBA1C" in the last 72 hours. CBG: No results for input(s): "GLUCAP" in the last 168 hours. Lipid Profile: No results for input(s): "CHOL", "HDL", "LDLCALC", "TRIG", "CHOLHDL", "LDLDIRECT" in the last 72 hours. Thyroid Function Tests: No results for input(s): "TSH", "T4TOTAL", "FREET4", "T3FREE", "THYROIDAB" in the last 72 hours. Anemia Panel: No results for input(s): "VITAMINB12", "FOLATE", "FERRITIN", "TIBC", "IRON", "RETICCTPCT" in the last 72 hours. Sepsis Labs: No results for input(s): "PROCALCITON", "LATICACIDVEN" in the last 168 hours.  Recent Results (from the past 240 hours)  Blood culture (routine x 2)     Status: None   Collection Time: 10/13/23  8:20 AM   Specimen: BLOOD  Result Value Ref Range Status   Specimen  Description   Final    BLOOD LEFT ANTECUBITAL Performed at [facility], [address], [place], [place] [address]    Special Requests   Final    BOTTLES DRAWN AEROBIC AND ANAEROBIC Blood Culture results may not be optimal due to an inadequate volume of blood received in culture bottles Performed at [facility], [address], [place], [place] [address]    Culture   Final    NO GROWTH 5 DAYS Performed at [facility], [address] [place], [place] [address]    Report Status 10/18/2023 FINAL  Final  Blood culture (routine x 2)     Status: None   Collection Time:  10/13/23  8:25 AM   Specimen: BLOOD LEFT HAND  Result Value Ref Range Status   Specimen Description   Final    BLOOD LEFT HAND Performed at [facility], [address], [place], [place] [address]    Special Requests   Final    BOTTLES DRAWN AEROBIC AND ANAEROBIC Blood Culture results may not be optimal due to an inadequate volume of blood received in culture bottles Performed at [facility], [address], [place], [place] [address]    Culture   Final    NO GROWTH 5 DAYS Performed at [facility], [address] [place], [place] [address]    Report Status 10/18/2023 FINAL  Final  Aerobic/Anaerobic Culture w Gram Stain (surgical/deep wound)     Status: None   Collection Time: 10/15/23 11:13 AM   Specimen: Abscess  Result Value Ref Range Status   Specimen Description ABSCESS  Final   Special Requests right arm  Final   Gram Stain   Final    MODERATE WBC PRESENT, PREDOMINANTLY PMN RARE GRAM POSITIVE COCCI    Culture   Final    RARE STREPTOCOCCUS GROUP G Beta hemolytic streptococci are predictably susceptible to penicillin and other beta lactams. Susceptibility testing not routinely performed. NO ANAEROBES ISOLATED Performed at [facility], [address] [place], [place] [address]    Report Status 10/20/2023 FINAL  Final   Surgical pcr screen     Status: None   Collection Time: 10/17/23 11:54 AM   Specimen: Nasal Mucosa; Nasal Swab  Result Value Ref Range Status   MRSA, PCR NEGATIVE NEGATIVE Final   Staphylococcus aureus NEGATIVE NEGATIVE Final    Comment: (NOTE) The Xpert SA Assay (FDA approved for NASAL specimens in patients 72 years of age and older), is one component of a comprehensive surveillance program. It is not intended to diagnose infection nor to guide or monitor treatment. Performed at [facility], [address] [place], [place] [address]   Aerobic/Anaerobic Culture w Gram Stain (surgical/deep wound)     Status: None (Preliminary result)   Collection Time: 10/17/23  1:45 PM   Specimen: Path fluid; Body Fluid  Result Value Ref Range Status   Specimen Description ABSCESS  Final   Special Requests RT ELBOW PT ON VANC ANCEF  Final   Gram Stain NO WBC SEEN NO ORGANISMS SEEN   Final   Culture   Final    NO GROWTH 4 DAYS NO ANAEROBES ISOLATED; CULTURE IN PROGRESS FOR 5 DAYS Performed at [facility], [address], [place], [place] [address]    Report Status PENDING  Incomplete  Aerobic/Anaerobic Culture w Gram Stain (surgical/deep wound)     Status: None (Preliminary result)   Collection Time: 10/17/23  1:45 PM   Specimen: Path Tissue  Result Value Ref Range Status   Specimen Description TISSUE  Final   Special Requests RT ELBOW PT ON ANCEF VANC  Final   Gram Stain   Final    RARE WBC PRESENT, PREDOMINANTLY PMN NO ORGANISMS SEEN Performed at [facility], [address], [place], [place] [address]    Culture   Final    RARE STREPTOCOCCUS GROUP G Beta hemolytic streptococci are predictably susceptible to penicillin and other beta lactams. Susceptibility testing not routinely performed. NO ANAEROBES ISOLATED; CULTURE IN PROGRESS FOR 5 DAYS    Report Status PENDING  Incomplete  Radiology Studies: No results found.         LOS: 9 days    Time spent= 35 mins    Miguel Rota, MD [facility]  If 7PM-7AM, please contact night-coverage  10/22/2023, 10:59 AM

## 2023-10-22 NOTE — Consult Note (Signed)
Trevor [name] Psychiatric Consult Follow up  Patient Name: .Trevor [name]  MRN: [id number]  DOB: 1981/08/15  Consult Order details:  Orders (From admission, onward)     Start     Ordered   10/21/23 1010  IP CONSULT TO PSYCHIATRY       Ordering Provider: Arnetha Courser, MD  Provider:  (Not yet assigned)  Question Answer Comment  Location [facility]   Reason for Consult? H/O Bipolar, looks like going into manic episode      10/21/23 1010             Mode of Visit: In person    Psychiatry Consult Evaluation  Service Date: October 22, 2023 LOS:  LOS: 9 days  Chief Complaint "I get violent when I get manicky and nothing clams me down.  I get therr and can't come down. I don't want to be there."  Primary Psychiatric Diagnoses  Methamphetamine dependence 2.  Anxiety  Assessment  Trevor [name] is a 43 y.o. male admitted: Medicallyfor 10/13/2023  7:49 AM for bacteriemia infection. He carries the psychiatric diagnoses of methamphetamine dependence (IV and smoking) and has a past medical history of  bacteriemia, osteomyelitis or left arm, septic bursitis of right elbow, and anemia   His current presentation of restlessness complaints and feeling panicky is most consistent with anxiety  Current outpatient psychotropic medications include gabapentin, hydroxyzine, Buspar, Lexapro, and Seroquel 100 mg and historically he has had a partial response to these medications. He was none compliant with medications prior to admission as evidenced by client report. On initial examination, patient was irritable and complained of anxiety and depression. Please see plan below for detailed recommendations.   Diagnoses:  Active Hospital problems: Principal Problem:   Septic olecranon bursitis of right elbow Active Problems:   Cellulitis of right elbow   Hypokalemia   Normocytic anemia   Polysubstance abuse (HCC)   History of intravenous drug abuse   Osteomyelitis of  left arm (HCC)   Methamphetamine dependence (HCC)    Plan   ## Psychiatric Medication Recommendations:  -Zyprexa 5 mg BID -Doxepine 10 mg at bedtime for sleep increased to 25 mg at bedtime Recommended rehab, he declined.  Does not warrant an inpatient hospitalization at this time.  ## Medical Decision Making Capacity: Not specifically addressed in this encounter  ## Further Work-up:  -- EKG ordered -- most recent EKG ordered -- Pertinent labwork reviewed earlier this admission includes: CMP, scans   ## Disposition:-- There are no psychiatric contraindications to discharge at this time  ## Behavioral / Environmental: - No specific recommendations at this time.     ## Safety and Observation Level:  - Based on my clinical evaluation, I estimate the patient to be at low risk of self harm in the current setting. - At this time, we recommend  routine. This decision is based on my review of the chart including patient's history and current presentation, interview of the patient, mental status examination, and consideration of suicide risk including evaluating suicidal ideation, plan, intent, suicidal or self-harm behaviors, risk factors, and protective factors. This judgment is based on our ability to directly address suicide risk, implement suicide prevention strategies, and develop a safety plan while the patient is in the clinical setting. Please contact our team if there is a concern that risk level has changed.  CSSR Risk Category:C-SSRS RISK CATEGORY: No Risk  Suicide Risk Assessment: Patient has following modifiable risk factors for suicide: under treated depression ,  which we are addressing by starting a mood stabilizer. Patient has following non-modifiable or demographic risk factors for suicide: male gender Patient has the following protective factors against suicide: Access to outpatient mental [name] care, Supportive friends, and no history of suicide attempts  Thank you for  this consult request. Recommendations have been communicated to the primary team.  We will continue to follow at this time.   Nanine Means, NP       History of Present Illness  Relevant Aspects of [facility] Course:  Admitted on 10/13/2023 for bacteriemia. They are receiving IV antibiotics.   Patient Report, 1/31:  The client reports he is restless and having depression, moderate level.  He has chronic passive suicidal ideations "all the time", no plan or intent, denies past suicide attempts.  On assessment, he voiced he was having a panic attack despite lying calmly in bed with no outward signs.  He described himself with "manicky episodes" where he gets violent and calm himself.  During these periods he does not have a lack of sleep, increase in energy, grandiosity, or other manic symptoms.  Denies hallucinations, paranoia, homicidal ideations.  He was using meth via IV or smoking daily prior to admission, denies any withdrawal symptoms.  Discussed rehab which he declined as "it is not conducive for me, not the right environment".  Appetite is "alright", sleep is poor.  Discussed several medications for anxiety and insomnia to which he replied, "I've tried every".  Failed trials of propranolol, Busapr, Lexapro, hydroxyzine, gabapentin, Trazodone, and Seroquel.  He was not interested in propranolol.  Discussed Zyprexa for his mood, sleep, and anxiety to which he was agreeable.  He specifically requested Doxepin for sleep as well.  Evidently, this does help him sleep.  10/22/2023 Upon assessment, the client is sitting on the edge of bed finishing breakfast. Per the client, his appetite is good as evidence by 100% of breakfast tray eaten. He stated, "I am good I guess, I could use an increase in the Doxepin", denies medication side effects.  His anxiety is moderate, "I am waiting for my morning medications still."  Depression is also moderate and continues to have chronic passive suicidal ideations  with no plan or intent. Client denied hallucinations, paranoia and homicidal ideation. Positive for substance cravings, specifically towards marijuana, "I want to do drugs but that's me. I just want to smoke some weed and a little meth." Recommended rehab again today which he declined, he is interested in outpatient treatment, resources provided in his discharge instructions.  He did think the Zyprexa worked well for him and would like to continue it.  Psych ROS:  Depression: moderate to high Anxiety:  high Mania (lifetime and current): "manicky episodes" that don't reflect cristeria for bipolar d/o Psychosis: (lifetime and current): occasionally when using meth  Review of Systems  Psychiatric/Behavioral:  Positive for depression and substance abuse. The patient is nervous/anxious.   All other systems reviewed and are negative.    Psychiatric and Social History  Psychiatric History:  Information collected from patient and chart.  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 Hx: denies Living Situation: lives with his friend  Access to  weapons/lethal means: none   Substance History Alcohol: denies  History of alcohol withdrawal seizures none History of DT's none Tobacco: yes Illicit drugs: meth via smoking and IV, cannabis via smoking Prescription drug abuse: denies Rehab hx: Yes  Exam Findings  Physical Exam: some pain on his infected areas Vital Signs:  Temp:  [97.5 F (36.4 C)-97.8 F (36.6 C)] 97.8 F (36.6 C) (02/01 0525) Pulse Rate:  [63-81] 63 (02/01  0525) Resp:  [17] 17 (02/01 0525) BP: (109-171)/(54-82) 171/54 (02/01 0525) SpO2:  [97 %-99 %] 99 % (02/01 0525) Blood pressure (!) 171/54, pulse 63, temperature 97.8 F (36.6 C), resp. rate 17, height 6' (1.829 m), weight 74.8 kg, SpO2 99%. Body mass index is 22.38 kg/m.  Physical Exam Vitals and nursing note reviewed.  Constitutional:      Appearance: Normal appearance.  HENT:     Head: Normocephalic.     Nose: Nose normal.  Pulmonary:     Effort: Pulmonary effort is normal.  Musculoskeletal:        General: Normal range of motion.     Cervical back: Normal range of motion.  Neurological:     Mental Status: He is alert.     Mental Status Exam: General Appearance: Disheveled  Orientation:  Full (Time, Place, and Person)  Memory:  Immediate;   Fair Recent;   Fair Remote;   Fair  Concentration:  Concentration: Fair and Attention Span: Fair  Recall:  Good  Attention  Fair  Eye Contact:  Fair  Speech:  Normal Rate  Language:  Good  Volume:  Normal  Mood: depressed, anxious  Affect:  Congruent  Thought Process:  Coherent  Thought Content:  Logical  Suicidal Thoughts:  No  Homicidal Thoughts:  No  Judgement:  Fair  Insight:  Fair  Psychomotor Activity:  Normal  Akathisia:  No  Fund of Knowledge:  Fair      Assets:  Housing Leisure Time Resilience  Cognition:  WNL  ADL's:  Intact  AIMS (if indicated):        Other History   These have been pulled in through the EMR, reviewed, and updated if appropriate.  Family History:  The patient's family history is not on file.  Medical History: Past Medical History:  Diagnosis Date   MRSA infection    Substance abuse (HCC)     Surgical History: Past Surgical History:  Procedure Laterality Date   BACK SURGERY     IRRIGATION AND DEBRIDEMENT ELBOW Right 10/17/2023   Procedure: IRRIGATION AND DEBRIDEMENT ELBOW;  Surgeon: Bradly Bienenstock, MD;  Location: [facility] OR;  Service: Orthopedics;  Laterality: Right;   LUMBAR  FUSION     MEDIAL COLLATERAL LIGAMENT AND LATERAL COLLATERAL LIGAMENT REPAIR, KNEE Right      Medications:   Current Facility-Administered Medications:    acetaminophen (TYLENOL) tablet 650 mg, 650 mg, Oral, Q6H PRN, 650 mg at 10/21/23 1259 **OR** acetaminophen (TYLENOL) suppository 650 mg, 650 mg, Rectal, Q6H PRN, Bobette Mo, MD   amLODipine (NORVASC) tablet 5 mg, 5 mg, Oral, Daily, Danford, Earl Lites, MD, 5 mg at 10/21/23 1015   busPIRone (BUSPAR) tablet 7.5 mg, 7.5 mg, Oral, TID, Bobette Mo, MD, 7.5 mg at 10/21/23 2239   ceFAZolin (ANCEF) IVPB 2g/100 mL premix, 2 g, Intravenous, Q8H, Daiva Eves, Lisette Grinder, MD, Last Rate: 200 mL/hr at 10/22/23 0646, 2 g at 10/22/23 4098   Chlorhexidine Gluconate Cloth 2 % PADS 6 each, 6 each, Topical, Daily, Bobette Mo,  MD, 6 each at 10/21/23 1016   diphenhydrAMINE (BENADRYL) injection 25 mg, 25 mg, Intravenous, Once PRN, Daiva Eves, Lisette Grinder, MD   doxepin (SINEQUAN) capsule 10 mg, 10 mg, Oral, QHS, Makhya Arave Y, NP, 10 mg at 10/21/23 2244   enoxaparin (LOVENOX) injection 40 mg, 40 mg, Subcutaneous, Q24H, Bobette Mo, MD, 40 mg at 10/21/23 2233   EPINEPHrine (EPI-PEN) injection 0.3 mg, 0.3 mg, Intramuscular, Once PRN, Daiva Eves, Lisette Grinder, MD   escitalopram Judye Bos) tablet 10 mg, 10 mg, Oral, Daily, Bobette Mo, MD, 10 mg at 10/21/23 1015   feeding supplement (ENSURE ENLIVE / ENSURE PLUS) liquid 237 mL, 237 mL, Oral, BID BM, Danford, Earl Lites, MD, 237 mL at 10/21/23 1539   hydrALAZINE (APRESOLINE) injection 10 mg, 10 mg, Intravenous, Q4H PRN, Amin, Ankit C, MD   hydrOXYzine (ATARAX) tablet 50 mg, 50 mg, Oral, Q6H PRN, Alberteen Sam, MD, 50 mg at 10/22/23 0432   melatonin tablet 10 mg, 10 mg, Oral, QHS PRN, Anthoney Harada, NP, 10 mg at 10/20/23 2044   methocarbamol (ROBAXIN) tablet 750 mg, 750 mg, Oral, Q6H PRN, Alberteen Sam, MD, 750 mg at 10/22/23 0431   metoprolol tartrate  (LOPRESSOR) injection 5 mg, 5 mg, Intravenous, Q4H PRN, Amin, Ankit C, MD   naproxen (NAPROSYN) tablet 500 mg, 500 mg, Oral, TID PRN, Anthoney Harada, NP, 500 mg at 10/22/23 0430   nicotine (NICODERM CQ - dosed in mg/24 hours) patch 14 mg, 14 mg, Transdermal, Daily, Bobette Mo, MD, 14 mg at 10/21/23 1017   ondansetron (ZOFRAN) tablet 4 mg, 4 mg, Oral, Q6H PRN **OR** ondansetron (ZOFRAN) injection 4 mg, 4 mg, Intravenous, Q6H PRN, Bobette Mo, MD   Oral care mouth rinse, 15 mL, Mouth Rinse, PRN, Danford, Earl Lites, MD   sodium chloride flush (NS) 0.9 % injection 10-40 mL, 10-40 mL, Intracatheter, Q12H, Bobette Mo, MD, 10 mL at 10/21/23 1016   sodium chloride flush (NS) 0.9 % injection 10-40 mL, 10-40 mL, Intracatheter, PRN, Bobette Mo, MD  Allergies: Allergies  Allergen Reactions   Penicillins     Childhood allergy as throat swelling Tolerated oral cefadroxil 10/21/23    Nanine Means, NP

## 2023-10-22 NOTE — Discharge Instructions (Signed)
[facility] [name], [place]  [address] [phone]  [facility] [address] [place], [place], [place] [address] Hours:  Closed ? Opens 9?AM Mon Phone: [phone]

## 2023-10-23 DIAGNOSIS — M71121 Other infective bursitis, right elbow: Secondary | ICD-10-CM | POA: Diagnosis not present

## 2023-10-23 NOTE — Plan of Care (Signed)
 ?  Problem: Clinical Measurements: ?Goal: Ability to maintain clinical measurements within normal limits will improve ?Outcome: Progressing ?Goal: Will remain free from infection ?Outcome: Progressing ?Goal: Diagnostic test results will improve ?Outcome: Progressing ?  ?

## 2023-10-23 NOTE — Progress Notes (Signed)
PROGRESS NOTE    Trevor [name]  ZOX:[id number] DOB: [birth year] DOA: 10/13/2023 PCP: Patient, No Pcp Per    Brief Narrative:  43 year old with history of bipolar 1, IV drug abuse, history of MSSA bacteremia 2 months ago negative TEE, depression, recurrent SI discharged from behavioral health 2 weeks prior to admission now presents with right elbow swelling and pain.  Upon admission noted to have cellulitis and abscess.  [facility]went [facility] on 1/27 and started on IV antibiotics per ID.  At this time given his prior history of drug abuse and psych illness, safe disposition may be an issue.   Assessment & Plan:  Principal Problem:   Septic olecranon bursitis of right elbow Active Problems:   Cellulitis of right elbow   Hypokalemia   Normocytic anemia   Polysubstance abuse (HCC)   History of intravenous drug abuse   Osteomyelitis of left arm (HCC)   Methamphetamine dependence (HCC)      Sepsis secondary to cellulitis and abscess of the right elbow, POA Olecranon osteomyelitis -Status post right olecranon bursectomy and I&D of deep abscess 1/27 by Dr. Orlan Leavens.  Culture data has been reviewed.  Cultures are growing group G strep. - ID to identify long-term antibiotics.  Essentially patient will need about 6 weeks  Normocytic anemia - Hemoglobin has remained stable   Bipolar 1 disorder (HCC) Management per psychiatry.  Currently on Zyprexa twice daily, BuSpar, Lexapro, doxepin 10 mg at bedtime.  Defer further workup to their service.  No inpatient psych  needs identified per their service    Hypokalemia - Supplemented and resolved   Polysubstance abuse - Avoid opiates/narcotics as appropriate -if truly intractable pain can initiate buprenorphine (resting comfortably no acute distress) - Acetaminophen, naproxen, Robaxin   DVT prophylaxis:Lovenox    Code Status: Full Code Family Communication:   Status is: Inpatient Remains inpatient appropriate because:  Cont hosp stay for management of psych issues and Abx needed.     Subjective:  Laying in the bed sleeping, does not want to talk to me.  Examination:  General exam: Appears calm and comfortable  Respiratory system: Clear to auscultation. Respiratory effort normal. Cardiovascular system: S1 & S2 heard, RRR. No JVD, murmurs, rubs, gallops or clicks. No pedal edema. Gastrointestinal system: Abdomen is nondistended, soft and nontender. No organomegaly or masses felt. Normal bowel sounds heard. Central nervous system: Alert and oriented. No focal neurological deficits. Extremities: Symmetric 5 x 5 power. Skin: No rashes, lesions or ulcers Psychiatry: Judgement and insight appear normal. Mood & affect appropriate.                Diet Orders (From admission, onward)     Start     Ordered   10/17/23 1634  Diet regular Room service appropriate? Yes; Fluid consistency: Thin  Diet effective now       Question Answer Comment  Room service appropriate? Yes   Fluid consistency: Thin      10/17/23 1633            Objective: Vitals:   10/22/23 0525 10/22/23 1248 10/22/23 2225 10/23/23 0503  BP: (!) 171/54 123/75 120/74 (!) 157/64  Pulse: 63 87 78 70  Resp: 17 17 16 17   Temp: 97.8 F (36.6 C) 97.7 F (36.5 C) (!) 97.5 F (36.4 C) (!) 97.3 F (36.3 C)  TempSrc:  Oral Oral Oral  SpO2: 99% 99% 100% 99%  Weight:      Height:  Intake/Output Summary (Last 24 hours) at 10/23/2023 1222 Last data filed at 10/23/2023 1000 Gross per 24 hour  Intake 1120 ml  Output --  Net 1120 ml   Filed Weights   10/13/23 0758 10/13/23 1233 10/17/23 1147  Weight: 87.1 kg 73.9 kg 74.8 kg    Scheduled Meds:  amLODipine  5 mg Oral Daily   busPIRone  7.5 mg Oral TID   Chlorhexidine Gluconate Cloth  6 each Topical Daily   doxepin  25 mg Oral QHS   enoxaparin (LOVENOX) injection  40 mg Subcutaneous Q24H   escitalopram  10 mg Oral Daily   feeding supplement  237 mL Oral BID BM    nicotine  14 mg Transdermal Daily   OLANZapine  5 mg Oral BID   sodium chloride flush  10-40 mL Intracatheter Q12H   Continuous Infusions:   ceFAZolin (ANCEF) IV 2 g (10/23/23 0516)    Nutritional status     Body mass index is 22.38 kg/m.  Data Reviewed:   CBC: Recent Labs  Lab 10/17/23 0137 10/18/23 0339 10/19/23 0510  WBC 5.7 8.3 9.7  HGB 11.4* 11.0* 11.5*  HCT 34.8* 33.7* 35.0*  MCV 90.4 89.9 90.7  PLT 314 336 365   Basic Metabolic Panel: Recent Labs  Lab 10/17/23 0137 10/18/23 0339 10/19/23 0510  NA  --  133* 140  K  --  3.9 3.8  CL  --  97* 102  CO2  --  28 29  GLUCOSE  --  203* 103*  BUN  --  23* 20  CREATININE 0.51* 0.69 0.33*  CALCIUM  --  8.6* 8.9   GFR: Estimated Creatinine Clearance: 127.3 mL/min (A) (by C-G formula based on SCr of 0.33 mg/dL (L)). Liver Function Tests: No results for input(s): "AST", "ALT", "ALKPHOS", "BILITOT", "PROT", "ALBUMIN" in the last 168 hours. No results for input(s): "LIPASE", "AMYLASE" in the last 168 hours. No results for input(s): "AMMONIA" in the last 168 hours. Coagulation Profile: Recent Labs  Lab 10/17/23 0137  INR 1.0   Cardiac Enzymes: No results for input(s): "CKTOTAL", "CKMB", "CKMBINDEX", "TROPONINI" in the last 168 hours. BNP (last 3 results) No results for input(s): "PROBNP" in the last 8760 hours. HbA1C: No results for input(s): "HGBA1C" in the last 72 hours. CBG: No results for input(s): "GLUCAP" in the last 168 hours. Lipid Profile: No results for input(s): "CHOL", "HDL", "LDLCALC", "TRIG", "CHOLHDL", "LDLDIRECT" in the last 72 hours. Thyroid Function Tests: No results for input(s): "TSH", "T4TOTAL", "FREET4", "T3FREE", "THYROIDAB" in the last 72 hours. Anemia Panel: No results for input(s): "VITAMINB12", "FOLATE", "FERRITIN", "TIBC", "IRON", "RETICCTPCT" in the last 72 hours. Sepsis Labs: No results for input(s): "PROCALCITON", "LATICACIDVEN" in the last 168 hours.  Recent Results (from  the past 240 hours)  Aerobic/Anaerobic Culture w Gram Stain (surgical/deep wound)     Status: None   Collection Time: 10/15/23 11:13 AM   Specimen: Abscess  Result Value Ref Range Status   Specimen Description ABSCESS  Final   Special Requests right arm  Final   Gram Stain   Final    MODERATE WBC PRESENT, PREDOMINANTLY PMN RARE GRAM POSITIVE COCCI    Culture   Final    RARE STREPTOCOCCUS GROUP G Beta hemolytic streptococci are predictably susceptible to penicillin and other beta lactams. Susceptibility testing not routinely performed. NO ANAEROBES ISOLATED Performed at [facility], [address], [place], [place] [address]    Report Status 10/20/2023 FINAL  Final  Surgical pcr screen  Status: None   Collection Time: 10/17/23 11:54 AM   Specimen: Nasal Mucosa; Nasal Swab  Result Value Ref Range Status   MRSA, PCR NEGATIVE NEGATIVE Final   Staphylococcus aureus NEGATIVE NEGATIVE Final    Comment: (NOTE) The Xpert SA Assay (FDA approved for NASAL specimens in patients 37 years of age and older), is one component of a comprehensive surveillance program. It is not intended to diagnose infection nor to guide or monitor treatment. Performed at [facility], [address] [place], [place] [address]   Aerobic/Anaerobic Culture w Gram Stain (surgical/deep wound)     Status: None   Collection Time: 10/17/23  1:45 PM   Specimen: Path fluid; Body Fluid  Result Value Ref Range Status   Specimen Description ABSCESS  Final   Special Requests RT ELBOW PT ON VANC ANCEF  Final   Gram Stain NO WBC SEEN NO ORGANISMS SEEN   Final   Culture   Final    No growth aerobically or anaerobically. Performed at [facility], [address] [place], [place] [address]    Report Status 10/22/2023 FINAL  Final  Aerobic/Anaerobic Culture w Gram Stain (surgical/deep wound)     Status: None   Collection Time: 10/17/23  1:45 PM   Specimen: Path Tissue  Result Value Ref Range  Status   Specimen Description TISSUE  Final   Special Requests RT ELBOW PT ON ANCEF VANC  Final   Gram Stain   Final    RARE WBC PRESENT, PREDOMINANTLY PMN NO ORGANISMS SEEN    Culture   Final    RARE STREPTOCOCCUS GROUP G Beta hemolytic streptococci are predictably susceptible to penicillin and other beta lactams. Susceptibility testing not routinely performed. NO ANAEROBES ISOLATED Performed at [facility], [address] [place], [place] [address]    Report Status 10/22/2023 FINAL  Final         Radiology Studies: No results found.         LOS: 10 days   Time spent= 35 mins    Miguel Rota, MD [facility]  If 7PM-7AM, please contact night-coverage  10/23/2023, 12:22 PM

## 2023-10-23 NOTE — Progress Notes (Signed)
ID brief note  Afebrile  Results for orders placed or performed during the hospital encounter of 10/13/23  Blood culture (routine x 2)     Status: None   Collection Time: 10/13/23  8:20 AM   Specimen: BLOOD  Result Value Ref Range Status   Specimen Description   Final    BLOOD LEFT ANTECUBITAL Performed at [facility], [address] [place], [place] [address]    Special Requests   Final    BOTTLES DRAWN AEROBIC AND ANAEROBIC Blood Culture results may not be optimal due to an inadequate volume of blood received in culture bottles Performed at [facility], [address], [place], [place] [address]    Culture   Final    NO GROWTH 5 DAYS Performed at [facility], [address] [place], [place] [address]    Report Status 10/18/2023 FINAL  Final  Blood culture (routine x 2)     Status: None   Collection Time: 10/13/23  8:25 AM   Specimen: BLOOD LEFT HAND  Result Value Ref Range Status   Specimen Description   Final    BLOOD LEFT HAND Performed at [facility], [address], [place], [place] [address]    Special Requests   Final    BOTTLES DRAWN AEROBIC AND ANAEROBIC Blood Culture results may not be optimal due to an inadequate volume of blood received in culture bottles Performed at [facility], [address] [place], [place] [address]    Culture   Final    NO GROWTH 5 DAYS Performed at [facility], [address], [place], [place] [address]    Report Status 10/18/2023 FINAL  Final  Aerobic/Anaerobic Culture w Gram Stain (surgical/deep wound)     Status: None   Collection Time: 10/15/23 11:13 AM   Specimen: Abscess  Result Value Ref Range Status   Specimen Description ABSCESS  Final   Special Requests right arm  Final   Gram Stain   Final    MODERATE WBC PRESENT, PREDOMINANTLY PMN RARE GRAM POSITIVE COCCI    Culture   Final    RARE STREPTOCOCCUS GROUP G Beta hemolytic streptococci are  predictably susceptible to penicillin and other beta lactams. Susceptibility testing not routinely performed. NO ANAEROBES ISOLATED Performed at [facility], [address] [place], [place] [address]    Report Status 10/20/2023 FINAL  Final  Surgical pcr screen     Status: None   Collection Time: 10/17/23 11:54 AM   Specimen: Nasal Mucosa; Nasal Swab  Result Value Ref Range Status   MRSA, PCR NEGATIVE NEGATIVE Final   Staphylococcus aureus NEGATIVE NEGATIVE Final    Comment: (NOTE) The Xpert SA Assay (FDA approved for NASAL specimens in patients 48 years of age and older), is one component of a comprehensive surveillance program. It is not intended to diagnose infection nor to guide or monitor treatment. Performed at [facility], [address], [place], [place] [address]   Aerobic/Anaerobic Culture w Gram Stain (surgical/deep wound)     Status: None   Collection Time: 10/17/23  1:45 PM   Specimen: Path fluid; Body Fluid  Result Value Ref Range Status   Specimen Description ABSCESS  Final   Special Requests RT ELBOW PT ON VANC ANCEF  Final   Gram Stain NO WBC SEEN NO ORGANISMS SEEN   Final   Culture   Final    No growth aerobically  or anaerobically. Performed at [facility], [address] [place], [place] [address]    Report Status 10/22/2023 FINAL  Final  Aerobic/Anaerobic Culture w Gram Stain (surgical/deep wound)     Status: None   Collection Time: 10/17/23  1:45 PM   Specimen: Path Tissue  Result Value Ref Range Status   Specimen Description TISSUE  Final   Special Requests RT ELBOW PT ON ANCEF VANC  Final   Gram Stain   Final    RARE WBC PRESENT, PREDOMINANTLY PMN NO ORGANISMS SEEN    Culture   Final    RARE STREPTOCOCCUS GROUP G Beta hemolytic streptococci are predictably susceptible to penicillin and other beta lactams. Susceptibility testing not routinely performed. NO ANAEROBES ISOLATED Performed at [facility], [address], [place], [place] [address]    Report Status 10/22/2023 FINAL  Final   Dr Daiva Eves back starting tomorrow  Odette Fraction, MD Infectious Disease Physician [facility] [address] [place], [place] [address] Phone: [phone]  Fax: [phone]

## 2023-10-24 DIAGNOSIS — M71121 Other infective bursitis, right elbow: Secondary | ICD-10-CM | POA: Diagnosis not present

## 2023-10-24 LAB — CBC
HCT: 39.5 % (ref 39.0–52.0)
Hemoglobin: 12.7 g/dL — ABNORMAL LOW (ref 13.0–17.0)
MCH: 29.3 pg (ref 26.0–34.0)
MCHC: 32.2 g/dL (ref 30.0–36.0)
MCV: 91 fL (ref 80.0–100.0)
Platelets: 423 10*3/uL — ABNORMAL HIGH (ref 150–400)
RBC: 4.34 MIL/uL (ref 4.22–5.81)
RDW: 14.6 % (ref 11.5–15.5)
WBC: 9.3 10*3/uL (ref 4.0–10.5)
nRBC: 0 % (ref 0.0–0.2)

## 2023-10-24 LAB — BASIC METABOLIC PANEL
Anion gap: 8 (ref 5–15)
BUN: 29 mg/dL — ABNORMAL HIGH (ref 6–20)
CO2: 27 mmol/L (ref 22–32)
Calcium: 9 mg/dL (ref 8.9–10.3)
Chloride: 102 mmol/L (ref 98–111)
Creatinine, Ser: 0.62 mg/dL (ref 0.61–1.24)
GFR, Estimated: >60 mL/min (ref >60)
Glucose, Bld: 103 mg/dL — ABNORMAL HIGH (ref 70–99)
Potassium: 4.1 mmol/L (ref 3.5–5.1)
Sodium: 137 mmol/L (ref 135–145)

## 2023-10-24 LAB — MAGNESIUM: Magnesium: 2.2 mg/dL (ref 1.7–2.4)

## 2023-10-24 MED ORDER — ALPRAZOLAM 0.5 MG PO TABS
0.5000 mg | ORAL_TABLET | Freq: Two times a day (BID) | ORAL | Status: DC | PRN
  Administered 2023-10-24 – 2023-10-26 (×4): 0.5 mg via ORAL
  Filled 2023-10-24 (×4): qty 1

## 2023-10-24 NOTE — Plan of Care (Signed)
  Problem: Coping: Goal: Level of anxiety will decrease Outcome: Progressing   Problem: Pain Managment: Goal: General experience of comfort will improve and/or be controlled Outcome: Progressing   Problem: Safety: Goal: Ability to remain free from injury will improve Outcome: Progressing   Problem: Skin Integrity: Goal: Skin integrity will improve Outcome: Progressing

## 2023-10-24 NOTE — Progress Notes (Signed)
PROGRESS NOTE    Trevor [name]  EAV:[id number] DOB: [birth year] DOA: 10/13/2023 PCP: Patient, No Pcp Per    Brief Narrative:  43 year old with history of bipolar 1, IV drug abuse, history of MSSA bacteremia 2 months ago negative TEE, depression, recurrent SI discharged from behavioral health 2 weeks prior to admission now presents with right elbow swelling and pain.  Upon admission noted to have cellulitis and abscess.  [facility] on 1/27 and started on IV antibiotics per ID.  At this time given his prior history of drug abuse and psych illness, safe disposition may be an issue.   Assessment & Plan:  Principal Problem:   Septic olecranon bursitis of right elbow Active Problems:   Cellulitis of right elbow   Hypokalemia   Normocytic anemia   Polysubstance abuse (HCC)   History of intravenous drug abuse   Osteomyelitis of left arm (HCC)   Methamphetamine dependence (HCC)      Sepsis secondary to cellulitis and abscess of the right elbow, POA Olecranon osteomyelitis -Status post right olecranon bursectomy and I&D of deep abscess 1/27 by Dr. Orlan Leavens.  Culture data has been reviewed.  Cultures are growing group G strep. - ID to identify long-term antibiotics.  Essentially patient will need about 6 weeks  Normocytic anemia - Hemoglobin has remained stable   Bipolar 1 disorder (HCC) Management per psychiatry.  Currently on Zyprexa twice daily, BuSpar, Lexapro, doxepin 10 mg at bedtime.  Defer further workup to their service.  No inpatient psych  needs identified per their service    Hypokalemia - Supplemented and resolved   Polysubstance abuse - Avoid opiates/narcotics as appropriate -if truly intractable pain can initiate buprenorphine (resting comfortably no acute distress) - Acetaminophen, naproxen, Robaxin  Periodic lab work.  Overall stable  DVT prophylaxis:Lovenox    Code Status: Full Code Family Communication:   Status is:  Inpatient Remains inpatient appropriate because: Cont hosp stay for management of psych issues and Abx needed.     Subjective: Patient seen and examined at bedside.  Continues to insist on starting anxiety medication as hydroxyzine is not sufficient for him.  Does not have any other complaints.   Examination:  General exam: Appears calm and comfortable  Respiratory system: Clear to auscultation. Respiratory effort normal. Cardiovascular system: S1 & S2 heard, RRR. No JVD, murmurs, rubs, gallops or clicks. No pedal edema. Gastrointestinal system: Abdomen is nondistended, soft and nontender. No organomegaly or masses felt. Normal bowel sounds heard. Central nervous system: Alert and oriented. No focal neurological deficits. Extremities: Symmetric 5 x 5 power. Skin: No rashes, lesions or ulcers Psychiatry: Judgement and insight appear normal. Mood & affect appropriate.                Diet Orders (From admission, onward)     Start     Ordered   10/17/23 1634  Diet regular Room service appropriate? Yes; Fluid consistency: Thin  Diet effective now       Question Answer Comment  Room service appropriate? Yes   Fluid consistency: Thin      10/17/23 1633            Objective: Vitals:   10/23/23 0503 10/23/23 1538 10/23/23 2130 10/24/23 0618  BP: (!) 157/64 (!) 151/70 (!) 148/72 139/77  Pulse: 70 86 68 79  Resp: 17 16 18 16   Temp: (!) 97.3 F (36.3 C) 98 F (36.7 C) 98.6 F (37 C)   TempSrc: Oral  Oral  SpO2: 99% 100% 98% 98%  Weight:      Height:        Intake/Output Summary (Last 24 hours) at 10/24/2023 0910 Last data filed at 10/24/2023 0905 Gross per 24 hour  Intake 1797.89 ml  Output --  Net 1797.89 ml   Filed Weights   10/13/23 0758 10/13/23 1233 10/17/23 1147  Weight: 87.1 kg 73.9 kg 74.8 kg    Scheduled Meds:  amLODipine  5 mg Oral Daily   busPIRone  7.5 mg Oral TID   Chlorhexidine Gluconate Cloth  6 each Topical Daily   doxepin  25 mg Oral  QHS   enoxaparin (LOVENOX) injection  40 mg Subcutaneous Q24H   escitalopram  10 mg Oral Daily   feeding supplement  237 mL Oral BID BM   nicotine  14 mg Transdermal Daily   OLANZapine  5 mg Oral BID   sodium chloride flush  10-40 mL Intracatheter Q12H   Continuous Infusions:   ceFAZolin (ANCEF) IV 2 g (10/24/23 0740)    Nutritional status     Body mass index is 22.38 kg/m.  Data Reviewed:   CBC: Recent Labs  Lab 10/18/23 0339 10/19/23 0510 10/24/23 0328  WBC 8.3 9.7 9.3  HGB 11.0* 11.5* 12.7*  HCT 33.7* 35.0* 39.5  MCV 89.9 90.7 91.0  PLT 336 365 423*   Basic Metabolic Panel: Recent Labs  Lab 10/18/23 0339 10/19/23 0510 10/24/23 0328  NA 133* 140 137  K 3.9 3.8 4.1  CL 97* 102 102  CO2 28 29 27   GLUCOSE 203* 103* 103*  BUN 23* 20 29*  CREATININE 0.69 0.33* 0.62  CALCIUM 8.6* 8.9 9.0  MG  --   --  2.2   GFR: Estimated Creatinine Clearance: 127.3 mL/min (by C-G formula based on SCr of 0.62 mg/dL). Liver Function Tests: No results for input(s): "AST", "ALT", "ALKPHOS", "BILITOT", "PROT", "ALBUMIN" in the last 168 hours. No results for input(s): "LIPASE", "AMYLASE" in the last 168 hours. No results for input(s): "AMMONIA" in the last 168 hours. Coagulation Profile: No results for input(s): "INR", "PROTIME" in the last 168 hours. Cardiac Enzymes: No results for input(s): "CKTOTAL", "CKMB", "CKMBINDEX", "TROPONINI" in the last 168 hours. BNP (last 3 results) No results for input(s): "PROBNP" in the last 8760 hours. HbA1C: No results for input(s): "HGBA1C" in the last 72 hours. CBG: No results for input(s): "GLUCAP" in the last 168 hours. Lipid Profile: No results for input(s): "CHOL", "HDL", "LDLCALC", "TRIG", "CHOLHDL", "LDLDIRECT" in the last 72 hours. Thyroid Function Tests: No results for input(s): "TSH", "T4TOTAL", "FREET4", "T3FREE", "THYROIDAB" in the last 72 hours. Anemia Panel: No results for input(s): "VITAMINB12", "FOLATE", "FERRITIN",  "TIBC", "IRON", "RETICCTPCT" in the last 72 hours. Sepsis Labs: No results for input(s): "PROCALCITON", "LATICACIDVEN" in the last 168 hours.  Recent Results (from the past 240 hours)  Aerobic/Anaerobic Culture w Gram Stain (surgical/deep wound)     Status: None   Collection Time: 10/15/23 11:13 AM   Specimen: Abscess  Result Value Ref Range Status   Specimen Description ABSCESS  Final   Special Requests right arm  Final   Gram Stain   Final    MODERATE WBC PRESENT, PREDOMINANTLY PMN RARE GRAM POSITIVE COCCI    Culture   Final    RARE STREPTOCOCCUS GROUP G Beta hemolytic streptococci are predictably susceptible to penicillin and other beta lactams. Susceptibility testing not routinely performed. NO ANAEROBES ISOLATED Performed at [facility], [address], [place], [place] [address]  Report Status 10/20/2023 FINAL  Final  Surgical pcr screen     Status: None   Collection Time: 10/17/23 11:54 AM   Specimen: Nasal Mucosa; Nasal Swab  Result Value Ref Range Status   MRSA, PCR NEGATIVE NEGATIVE Final   Staphylococcus aureus NEGATIVE NEGATIVE Final    Comment: (NOTE) The Xpert SA Assay (FDA approved for NASAL specimens in patients 59 years of age and older), is one component of a comprehensive surveillance program. It is not intended to diagnose infection nor to guide or monitor treatment. Performed at [facility], [address], [place], [place] [address]   Aerobic/Anaerobic Culture w Gram Stain (surgical/deep wound)     Status: None   Collection Time: 10/17/23  1:45 PM   Specimen: Path fluid; Body Fluid  Result Value Ref Range Status   Specimen Description ABSCESS  Final   Special Requests RT ELBOW PT ON VANC ANCEF  Final   Gram Stain NO WBC SEEN NO ORGANISMS SEEN   Final   Culture   Final    No growth aerobically or anaerobically. Performed at [facility], [address] [place], [place] [address]    Report Status 10/22/2023 FINAL  Final   Aerobic/Anaerobic Culture w Gram Stain (surgical/deep wound)     Status: None   Collection Time: 10/17/23  1:45 PM   Specimen: Path Tissue  Result Value Ref Range Status   Specimen Description TISSUE  Final   Special Requests RT ELBOW PT ON ANCEF VANC  Final   Gram Stain   Final    RARE WBC PRESENT, PREDOMINANTLY PMN NO ORGANISMS SEEN    Culture   Final    RARE STREPTOCOCCUS GROUP G Beta hemolytic streptococci are predictably susceptible to penicillin and other beta lactams. Susceptibility testing not routinely performed. NO ANAEROBES ISOLATED Performed at [facility], [address], [place], [place] [address]    Report Status 10/22/2023 FINAL  Final         Radiology Studies: No results found.         LOS: 11 days   Time spent= 35 mins    Miguel Rota, MD [facility]  If 7PM-7AM, please contact night-coverage  10/24/2023, 9:10 AM

## 2023-10-25 ENCOUNTER — Other Ambulatory Visit (HOSPITAL_COMMUNITY): Payer: Self-pay

## 2023-10-25 DIAGNOSIS — D649 Anemia, unspecified: Secondary | ICD-10-CM | POA: Diagnosis not present

## 2023-10-25 DIAGNOSIS — L03113 Cellulitis of right upper limb: Secondary | ICD-10-CM | POA: Diagnosis not present

## 2023-10-25 DIAGNOSIS — M71121 Other infective bursitis, right elbow: Secondary | ICD-10-CM | POA: Diagnosis not present

## 2023-10-25 DIAGNOSIS — Z87898 Personal history of other specified conditions: Secondary | ICD-10-CM | POA: Diagnosis not present

## 2023-10-25 MED ORDER — CEFADROXIL 500 MG PO CAPS
1000.0000 mg | ORAL_CAPSULE | Freq: Two times a day (BID) | ORAL | 0 refills | Status: DC
  Filled 2023-10-25: qty 112, 28d supply, fill #0

## 2023-10-25 MED ORDER — ORITAVANCIN DIPHOSPHATE 400 MG IV SOLR
1200.0000 mg | Freq: Once | INTRAVENOUS | Status: AC
  Administered 2023-10-25: 1200 mg via INTRAVENOUS
  Filled 2023-10-25: qty 120

## 2023-10-25 NOTE — Progress Notes (Addendum)
LUE PICC removed. Site unremarkable. Pressure dressing applied. Pt instructed to remain flat in bed for :30. Keep dressing dry and intact x 24 hours.

## 2023-10-25 NOTE — Plan of Care (Signed)
  Problem: Clinical Measurements: Goal: Ability to maintain clinical measurements within normal limits will improve Outcome: Progressing Goal: Will remain free from infection Outcome: Progressing Goal: Cardiovascular complication will be avoided Outcome: Progressing   Problem: Coping: Goal: Level of anxiety will decrease Outcome: Progressing   Problem: Pain Managment: Goal: General experience of comfort will improve and/or be controlled Outcome: Progressing

## 2023-10-25 NOTE — Progress Notes (Signed)
 IVT consult placed to assess PICC line for occulusion followed by consult to remove PICC line. This RN to bedside to speak with primary RN.  Babbie sent to MD to clarify order and POC. Per MD, plan is to received 1 more dose of IV medication and PICC to be removed once complete. This RN to bedside to assess PICC- line working appropriately following interventions.

## 2023-10-25 NOTE — Progress Notes (Signed)
 PROGRESS NOTE    Trevor [name]  FMW:[id number] DOB: 1980/11/30 DOA: 10/13/2023 PCP: Patient, No Pcp Per    Brief Narrative:  43 year old with history of bipolar 1, IV drug abuse, history of MSSA bacteremia 2 months ago negative TEE, depression, recurrent SI discharged from behavioral health 2 weeks prior to admission now presents with right elbow swelling and pain.  Upon admission noted to have cellulitis and abscess.  Underwent [facility]&D by [facility] on 1/27 and started on IV antibiotics per ID.  At this time given his prior history of drug abuse and psych illness, safe disposition may be an issue.   Assessment & Plan:  Principal Problem:   Septic olecranon bursitis of right elbow Active Problems:   Cellulitis of right elbow   Hypokalemia   Normocytic anemia   Polysubstance abuse (HCC)   History of intravenous drug abuse   Osteomyelitis of left arm (HCC)   Methamphetamine dependence (HCC)      Sepsis secondary to cellulitis and abscess of the right elbow, POA Olecranon osteomyelitis -Status post right olecranon bursectomy and I&D of deep abscess 1/27 by Dr. Ahmad.  Culture data has been reviewed.  Cultures are growing group G strep. - Discussed with ID who will see the patient later today to identify long-term antibiotic plan.  Normocytic anemia - Hemoglobin has remained stable   Bipolar 1 disorder (HCC) Management per psychiatry.  Currently on Zyprexa  twice daily, BuSpar , Lexapro , doxepin  10 mg at bedtime.  Defer further workup to their service.  No inpatient psych  needs identified per their service    Hypokalemia - Supplemented and resolved   Polysubstance abuse - Avoid opiates/narcotics as appropriate -if truly intractable pain can initiate buprenorphine (resting comfortably no acute distress) - Acetaminophen , naproxen , Robaxin   Periodic lab work.  Overall stable  DVT prophylaxis:Lovenox     Code Status: Full Code Family Communication:   Status is:  Inpatient Remains inpatient appropriate because: Cont hosp stay for management of psych issues and Abx needed.     Subjective: Does not have any complaints at this point   Examination:  General exam: Appears calm and comfortable  Respiratory system: Clear to auscultation. Respiratory effort normal. Cardiovascular system: S1 & S2 heard, RRR. No JVD, murmurs, rubs, gallops or clicks. No pedal edema. Gastrointestinal system: Abdomen is nondistended, soft and nontender. No organomegaly or masses felt. Normal bowel sounds heard. Central nervous system: Alert and oriented. No focal neurological deficits. Extremities: Symmetric 5 x 5 power. Skin: No rashes, lesions or ulcers Psychiatry: Judgement and insight appear normal. Mood & affect appropriate.                Diet Orders (From admission, onward)     Start     Ordered   10/17/23 1634  Diet regular Room service appropriate? Yes; Fluid consistency: Thin  Diet effective now       Question Answer Comment  Room service appropriate? Yes   Fluid consistency: Thin      10/17/23 1633            Objective: Vitals:   10/24/23 1322 10/24/23 2207 10/25/23 0535 10/25/23 1054  BP: (!) 162/88 (!) 151/72 (!) 147/77 (!) 147/77  Pulse: 74 84 81   Resp: 17 17 17    Temp: 97.6 F (36.4 C) 97.6 F (36.4 C) 98.4 F (36.9 C)   TempSrc:  Oral Oral   SpO2: 100% 98% 98%   Weight:      Height:  Intake/Output Summary (Last 24 hours) at 10/25/2023 1058 Last data filed at 10/25/2023 0912 Gross per 24 hour  Intake 1620 ml  Output --  Net 1620 ml   Filed Weights   10/13/23 0758 10/13/23 1233 10/17/23 1147  Weight: 87.1 kg 73.9 kg 74.8 kg    Scheduled Meds:  amLODipine   5 mg Oral Daily   busPIRone   7.5 mg Oral TID   Chlorhexidine  Gluconate Cloth  6 each Topical Daily   doxepin   25 mg Oral QHS   enoxaparin  (LOVENOX ) injection  40 mg Subcutaneous Q24H   escitalopram   10 mg Oral Daily   feeding supplement  237 mL Oral BID  BM   nicotine   14 mg Transdermal Daily   OLANZapine   5 mg Oral BID   sodium chloride  flush  10-40 mL Intracatheter Q12H   Continuous Infusions:   ceFAZolin  (ANCEF ) IV 2 g (10/25/23 0551)    Nutritional status     Body mass index is 22.38 kg/m.  Data Reviewed:   CBC: Recent Labs  Lab 10/19/23 0510 10/24/23 0328  WBC 9.7 9.3  HGB 11.5* 12.7*  HCT 35.0* 39.5  MCV 90.7 91.0  PLT 365 423*   Basic Metabolic Panel: Recent Labs  Lab 10/19/23 0510 10/24/23 0328  NA 140 137  K 3.8 4.1  CL 102 102  CO2 29 27  GLUCOSE 103* 103*  BUN 20 29*  CREATININE 0.33* 0.62  CALCIUM  8.9 9.0  MG  --  2.2   GFR: Estimated Creatinine Clearance: 127.3 mL/min (by C-G formula based on SCr of 0.62 mg/dL). Liver Function Tests: No results for input(s): AST, ALT, ALKPHOS, BILITOT, PROT, ALBUMIN in the last 168 hours. No results for input(s): LIPASE, AMYLASE in the last 168 hours. No results for input(s): AMMONIA in the last 168 hours. Coagulation Profile: No results for input(s): INR, PROTIME in the last 168 hours. Cardiac Enzymes: No results for input(s): CKTOTAL, CKMB, CKMBINDEX, TROPONINI in the last 168 hours. BNP (last 3 results) No results for input(s): PROBNP in the last 8760 hours. HbA1C: No results for input(s): HGBA1C in the last 72 hours. CBG: No results for input(s): GLUCAP in the last 168 hours. Lipid Profile: No results for input(s): CHOL, HDL, LDLCALC, TRIG, CHOLHDL, LDLDIRECT in the last 72 hours. Thyroid Function Tests: No results for input(s): TSH, T4TOTAL, FREET4, T3FREE, THYROIDAB in the last 72 hours. Anemia Panel: No results for input(s): VITAMINB12, FOLATE, FERRITIN, TIBC, IRON, RETICCTPCT in the last 72 hours. Sepsis Labs: No results for input(s): PROCALCITON, LATICACIDVEN in the last 168 hours.  Recent Results (from the past 240 hours)  Aerobic/Anaerobic Culture w Gram Stain  (surgical/deep wound)     Status: None   Collection Time: 10/15/23 11:13 AM   Specimen: Abscess  Result Value Ref Range Status   Specimen Description ABSCESS  Final   Special Requests right arm  Final   Gram Stain   Final    MODERATE WBC PRESENT, PREDOMINANTLY PMN RARE GRAM POSITIVE COCCI    Culture   Final    RARE STREPTOCOCCUS GROUP G Beta hemolytic streptococci are predictably susceptible to penicillin and other beta lactams. Susceptibility testing not routinely performed. NO ANAEROBES ISOLATED Performed at [facility], [address], [place], [place] [address]    Report Status 10/20/2023 FINAL  Final  Surgical pcr screen     Status: None   Collection Time: 10/17/23 11:54 AM   Specimen: Nasal Mucosa; Nasal Swab  Result Value Ref Range Status  MRSA, PCR NEGATIVE NEGATIVE Final   Staphylococcus aureus NEGATIVE NEGATIVE Final    Comment: (NOTE) The Xpert SA Assay (FDA approved for NASAL specimens in patients 52 years of age and older), is one component of a comprehensive surveillance program. It is not intended to diagnose infection nor to guide or monitor treatment. Performed at [facility], [address], [place], [place] [address]   Aerobic/Anaerobic Culture w Gram Stain (surgical/deep wound)     Status: None   Collection Time: 10/17/23  1:45 PM   Specimen: Path fluid; Body Fluid  Result Value Ref Range Status   Specimen Description ABSCESS  Final   Special Requests RT ELBOW PT ON VANC ANCEF   Final   Gram Stain NO WBC SEEN NO ORGANISMS SEEN   Final   Culture   Final    No growth aerobically or anaerobically. Performed at [facility], [address], [place], [place] [address]    Report Status 10/22/2023 FINAL  Final  Aerobic/Anaerobic Culture w Gram Stain (surgical/deep wound)     Status: None   Collection Time: 10/17/23  1:45 PM   Specimen: Path Tissue  Result Value Ref Range Status   Specimen Description TISSUE  Final   Special Requests  RT ELBOW PT ON ANCEF  VANC  Final   Gram Stain   Final    RARE WBC PRESENT, PREDOMINANTLY PMN NO ORGANISMS SEEN    Culture   Final    RARE STREPTOCOCCUS GROUP G Beta hemolytic streptococci are predictably susceptible to penicillin and other beta lactams. Susceptibility testing not routinely performed. NO ANAEROBES ISOLATED Performed at [facility], [address] [place], [place] [address]    Report Status 10/22/2023 FINAL  Final         Radiology Studies: No results found.         LOS: 12 days   Time spent= 35 mins    Burgess JAYSON Dare, MD [facility]  If 7PM-7AM, please contact night-coverage  10/25/2023, 10:58 AM
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
## 2023-10-26 ENCOUNTER — Other Ambulatory Visit (HOSPITAL_COMMUNITY): Payer: Self-pay

## 2023-10-26 ENCOUNTER — Encounter (HOSPITAL_COMMUNITY): Payer: Self-pay

## 2023-10-26 ENCOUNTER — Telehealth (HOSPITAL_COMMUNITY): Payer: Self-pay | Admitting: Pharmacy Technician

## 2023-10-26 DIAGNOSIS — M71121 Other infective bursitis, right elbow: Secondary | ICD-10-CM | POA: Diagnosis not present

## 2023-10-26 MED ORDER — AMLODIPINE BESYLATE 5 MG PO TABS
5.0000 mg | ORAL_TABLET | Freq: Every day | ORAL | 0 refills | Status: DC
  Filled 2023-10-26: qty 30, 30d supply, fill #0

## 2023-10-26 MED ORDER — HYDROXYZINE HCL 50 MG PO TABS
50.0000 mg | ORAL_TABLET | Freq: Two times a day (BID) | ORAL | 0 refills | Status: DC | PRN
  Filled 2023-10-26: qty 60, 30d supply, fill #0

## 2023-10-26 MED ORDER — GABAPENTIN 100 MG PO CAPS
100.0000 mg | ORAL_CAPSULE | Freq: Two times a day (BID) | ORAL | 0 refills | Status: DC
  Filled 2023-10-26: qty 60, 30d supply, fill #0

## 2023-10-26 MED ORDER — METHOCARBAMOL 750 MG PO TABS
750.0000 mg | ORAL_TABLET | Freq: Three times a day (TID) | ORAL | 0 refills | Status: DC | PRN
  Filled 2023-10-26: qty 30, 10d supply, fill #0

## 2023-10-26 MED ORDER — OLANZAPINE 5 MG PO TABS
5.0000 mg | ORAL_TABLET | Freq: Two times a day (BID) | ORAL | 0 refills | Status: DC
  Filled 2023-10-26: qty 60, 30d supply, fill #0

## 2023-10-26 MED ORDER — ALPRAZOLAM 0.5 MG PO TABS
0.5000 mg | ORAL_TABLET | Freq: Two times a day (BID) | ORAL | 0 refills | Status: DC | PRN
  Filled 2023-10-26: qty 30, 15d supply, fill #0

## 2023-10-26 NOTE — Discharge Summary (Signed)
 Physician Discharge Summary  Trevor [name] FMW:[id number] DOB: [birth year] DOA: 10/13/2023  PCP: Patient, No Pcp Per  Admit date: 10/13/2023 Discharge date: 10/26/2023  Admitted From: Home Disposition:  Home  Recommendations for Outpatient Follow-up:  Follow up with PCP in 1-2 weeks Please obtain BMP/CBC in one week your next doctors visit.  Outpatient follow-up with orthopedic later this week and infectious disease as advised Medications per psychiatry has been prescribed P.o. antibiotics to start on 11/01/2023.  This has been prescribed by infectious disease.  He has received 1 dose of oritavancin  in the hospital on 2/4.   Discharge Condition: Stable CODE STATUS: Full code Diet recommendation: Regular  Brief/Interim Summary: Brief Narrative:  43 year old with history of bipolar 1, IV drug abuse, history of MSSA bacteremia 2 months ago negative TEE, depression, recurrent SI discharged from behavioral health 2 weeks prior to admission now presents with right elbow swelling and pain.  Upon admission noted to have cellulitis and abscess.  Underwent [facility]&D by [facility] on 1/27 and started on IV antibiotics per ID.  At this time given his prior history of drug abuse and psych illness, safe disposition may be an issue. Eventually infectious disease gave patient a dose of oritavancin  in the hospital on 2/4.  Next p.o. regimen with a month supply has been plan from 2/11 onwards with outpatient follow-up.  Also outpatient follow-up with orthopedic has been advised to the patient as mentioned below. Medically stable for discharge today.   Assessment & Plan:  Principal Problem:   Septic olecranon bursitis of right elbow Active Problems:   Cellulitis of right elbow   Hypokalemia   Normocytic anemia   Polysubstance abuse (HCC)   History of intravenous drug abuse   Osteomyelitis of left arm (HCC)   Methamphetamine dependence (HCC)      Sepsis secondary to cellulitis and abscess of  the right elbow, POA Olecranon osteomyelitis -Status post right olecranon bursectomy and I&D of deep abscess 1/27 by Dr. Ahmad.  Culture data has been reviewed.  Cultures are growing group G strep. - Discussed with ID, received a dose of oritavancin  in the hospital.  Follow-up p.o. antibiotics  Discussed with orthopedic, Dr. Ahmad on the day of discharge.  He recommends patient to follow-up with his clinic later this week.  I have given patient all the information regarding his clinic which she will need to make appointment for.  Normocytic anemia - Hemoglobin has remained stable   Bipolar 1 disorder (HCC) Management per psychiatry.  Currently on Zyprexa  twice daily, BuSpar , Lexapro , doxepin  10 mg at bedtime.  Defer further workup to their service.  No inpatient psych  needs identified per their service Prescriptions have been given.   Hypokalemia - Supplemented and resolved   Polysubstance abuse - Avoid opiates/narcotics as appropriate -if truly intractable pain can initiate buprenorphine (resting comfortably no acute distress) - Acetaminophen , naproxen , Robaxin    DVT prophylaxis:Lovenox     Code Status: Full Code Family Communication:   Status is: Inpatient Remains inpatient appropriate because: Discharge today    Subjective: No complaints   Examination:  General exam: Appears calm and comfortable  Respiratory system: Clear to auscultation. Respiratory effort normal. Cardiovascular system: S1 & S2 heard, RRR. No JVD, murmurs, rubs, gallops or clicks. No pedal edema. Gastrointestinal system: Abdomen is nondistended, soft and nontender. No organomegaly or masses felt. Normal bowel sounds heard. Central nervous system: Alert and oriented. No focal neurological deficits. Extremities: Symmetric 5 x 5 power. Skin: No rashes, lesions or ulcers Psychiatry:  Judgement and insight appear normal. Mood & affect appropriate.    Discharge Diagnoses:  Principal Problem:   Septic  olecranon bursitis of right elbow Active Problems:   Cellulitis of right elbow   Hypokalemia   Normocytic anemia   Polysubstance abuse (HCC)   History of intravenous drug abuse   Osteomyelitis of left arm (HCC)   Methamphetamine dependence (HCC)      Discharge Exam: Vitals:   10/25/23 2138 10/26/23 0518  BP: (!) 155/83 (!) 144/85  Pulse: 90 81  Resp: 15 16  Temp: (!) 97.4 F (36.3 C) 98.1 F (36.7 C)  SpO2: 98% 98%   Vitals:   10/25/23 1054 10/25/23 1327 10/25/23 2138 10/26/23 0518  BP: (!) 147/77 (!) 122/59 (!) 155/83 (!) 144/85  Pulse:  84 90 81  Resp:  15 15 16   Temp:  97.8 F (36.6 C) (!) 97.4 F (36.3 C) 98.1 F (36.7 C)  TempSrc:   Oral Oral  SpO2:  100% 98% 98%  Weight:      Height:          Discharge Instructions  Discharge Instructions     meds to beds pharmacy consult ([facility] ONLY)   Complete by: As directed    meds to beds pharmacy consult ([facility] ONLY)   Complete by: As directed       Allergies as of 10/26/2023       Reactions   Penicillins    Childhood allergy  as throat swelling Tolerated oral cefadroxil  10/21/23        Medication List     STOP taking these medications    oseltamivir  75 MG capsule Commonly known as: TAMIFLU    QUEtiapine  200 MG tablet Commonly known as: SEROQUEL        TAKE these medications    ALPRAZolam  0.5 MG tablet Commonly known as: XANAX  Take 1 tablet (0.5 mg total) by mouth 2 (two) times daily as needed for anxiety (2nd line).   amLODipine  5 MG tablet Commonly known as: NORVASC  Take 1 tablet (5 mg total) by mouth daily.   busPIRone  7.5 MG tablet Commonly known as: BUSPAR  Take 7.5 mg by mouth 3 (three) times daily.   cefadroxil  500 MG capsule Commonly known as: DURICEF Take 2 capsules (1,000 mg total) by mouth 2 (two) times daily for 28 days. Start taking on: November 01, 2023   escitalopram  10 MG tablet Commonly known as: LEXAPRO  Take 10 mg by mouth 2 (two) times daily.    gabapentin  100 MG capsule Commonly known as: NEURONTIN  Take 1 capsule (100 mg total) by mouth 2 (two) times daily.   hydrOXYzine  50 MG tablet Commonly known as: ATARAX  Take 1 tablet (50 mg total) by mouth 2 (two) times daily as needed for anxiety.   ibuprofen  800 MG tablet Commonly known as: ADVIL  Take 800 mg by mouth every 8 (eight) hours as needed for moderate pain (pain score 4-6).   methocarbamol  750 MG tablet Commonly known as: ROBAXIN  Take 1 tablet (750 mg total) by mouth every 8 (eight) hours as needed for muscle spasms (or post-surgical pain).   OLANZapine  5 MG tablet Commonly known as: ZYPREXA  Take 1 tablet (5 mg total) by mouth 2 (two) times daily.        Follow-up Information     [facility] Follow up.   Why: Appointment: Friday, October 21, 2023 at 2:50pm  Please at least 24 hrs in advance if need to cancel or reschedule. Contact information: [address]  [place] [address] [place]  [address] [phone]        Shari Easter, MD. Call.   Specialty: Orthopedic Surgery Contact information: [address], [place] [address] [place] [address] [phone]                Allergies  Allergen Reactions   Penicillins     Childhood allergy  as throat swelling Tolerated oral cefadroxil  10/21/23    You were cared for by a hospitalist during your hospital stay. If you have any questions about your discharge medications or the care you received while you were in the hospital after you are discharged, you can call the unit and asked to speak with the hospitalist on call if the hospitalist that took care of you is not available. Once you are discharged, your primary care physician will handle any further medical issues. Please note that no refills for any discharge medications will be authorized once you are discharged, as it is imperative that you return to your primary care physician (or establish a relationship with  a primary care physician if you do not have one) for your aftercare needs so that they can reassess your need for medications and monitor your lab values.  You were cared for by a hospitalist during your hospital stay. If you have any questions about your discharge medications or the care you received while you were in the hospital after you are discharged, you can call the unit and asked to speak with the hospitalist on call if the hospitalist that took care of you is not available. Once you are discharged, your primary care physician will handle any further medical issues. Please note that NO REFILLS for any discharge medications will be authorized once you are discharged, as it is imperative that you return to your primary care physician (or establish a relationship with a primary care physician if you do not have one) for your aftercare needs so that they can reassess your need for medications and monitor your lab values.  Please request your Prim.MD to go over all Hospital Tests and Procedure/Radiological results at the follow up, please get all Hospital records sent to your Prim MD by signing hospital release before you go home.  Get CBC, CMP, 2 view Chest X ray checked  by Primary MD during your next visit or SNF MD in 5-7 days ( we routinely change or add medications that can affect your baseline labs and fluid status, therefore we recommend that you get the mentioned basic workup next visit with your PCP, your PCP may decide not to get them or add new tests based on their clinical decision)  On your next visit with your primary care physician please Get Medicines reviewed and adjusted.  If you experience worsening of your admission symptoms, develop shortness of breath, life threatening emergency, suicidal or homicidal thoughts you must seek medical attention immediately by calling 911 or calling your MD immediately  if symptoms less severe.  You Must read complete instructions/literature along  with all the possible adverse reactions/side effects for all the Medicines you take and that have been prescribed to you. Take any new Medicines after you have completely understood and accpet all the possible adverse reactions/side effects.   Do not drive, operate heavy machinery, perform activities at heights, swimming or participation in water  activities or provide baby sitting services if your were admitted for syncope or siezures until you have seen by Primary MD or a Neurologist and advised to do so again.  Do not drive when taking Pain medications.   Procedures/Studies: MR HUMERUS RIGHT WO CONTRAST Result Date: 10/17/2023 CLINICAL DATA:  Soft tissue infection suspected, upper arm, no prior imaging EXAM: MRI OF THE RIGHT HUMERUS WITHOUT CONTRAST TECHNIQUE: Multiplanar, multisequence MR imaging of the right humerus was performed. No intravenous contrast was administered. COMPARISON:  None Available. FINDINGS: Bones/Joint/Cartilage Field of view extends from the shoulder joint to the level of the distal humeral diaphysis. The distal aspect of the humerus and elbow joint were covered on concurrently obtained elbow MRI. Within the field of view, there is no fracture or dislocation. No bone marrow edema or marrow replacement. No glenohumeral joint effusion. No acromioclavicular joint effusion. Ligaments Intact. Muscles and Tendons Mild intramuscular edema within the proximal aspects of the anterior deltoid and lateral deltoid muscles. No intramuscular fluid collection. Intact tendons. Soft tissues Subcutaneous edema and ill-defined fluid most pronounced along the medial aspect of the mid to distal upper arm. No organized or drainable fluid collection evident on noncontrast imaging. There are multiple enlarged right axillary lymph nodes, largest measuring up to 1.5 cm short axis. IMPRESSION: 1. Subcutaneous edema and ill-defined fluid most pronounced along the medial aspect of the mid to distal upper arm,  most compatible with cellulitis. No organized or drainable fluid collections. 2. No evidence of osteomyelitis or septic arthritis. 3. Multiple enlarged right axillary lymph nodes, largest measuring up to 1.5 cm short axis, likely reactive. 4. Mild intramuscular edema within the proximal aspects of the anterior deltoid and lateral deltoid muscles, likely representing mild muscle strain. Electronically Signed   By: Mabel Converse D.O.   On: 10/17/2023 08:39   MR ELBOW RIGHT WO CONTRAST Result Date: 10/17/2023 CLINICAL DATA:  Septic arthritis suspected, elbow, xray done (Ped 0-17y) EXAM: MRI OF THE RIGHT ELBOW WITHOUT CONTRAST TECHNIQUE: Multiplanar, multisequence MR imaging of the elbow was performed. No intravenous contrast was administered. COMPARISON:  X-ray 10/13/2023 FINDINGS: Bones/Joint/Cartilage No acute fracture. No dislocation. Mild bone marrow edema within the posterior aspect of the olecranon process of the proximal ulna with subtle intermediate to low T1 marrow signal changes compatible with early acute osteomyelitis. No additional sites of bone marrow edema or marrow replacement. Physiologic amount of fluid within the elbow joint without significant effusion. Joint spaces are maintained. No erosion. Ligaments Intact medial and lateral elbow ligaments. Muscles and Tendons Normal muscle bulk and signal intensity without edema, atrophy, or fatty infiltration. Intact tendons. Soft tissues Circumferential subcutaneous edema. Organized fluid collection within the subcutaneous soft tissues overlying the olecranon process measuring approximately 2.3 x 0.6 x 2.2 cm. IMPRESSION: 1. 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 2. Circumferential subcutaneous edema with organized fluid collection overlying the olecranon process measuring approximately 2.3 x 0.6 x 2.2 cm. Appearance favors cellulitis with abscess. Olecranon bursitis (which could be septic or  aseptic) is also a consideration. 3. No evidence of septic arthritis of the right elbow. Electronically Signed   By: Mabel Converse D.O.   On: 10/17/2023 08:26   MR FOREARM RIGHT WO CONTRAST Result Date: 10/17/2023 CLINICAL DATA:  Soft tissue infection suspected, forearm, no prior imaging EXAM: MRI OF THE RIGHT FOREARM WITHOUT CONTRAST TECHNIQUE: Multiplanar, multisequence MR imaging of the right forearm was performed. No intravenous contrast was administered. COMPARISON:  X-ray 10/16/2023 FINDINGS: Bones/Joint/Cartilage Right radius and ulna are intact. No acute fracture. No dislocation. Mild bone marrow edema within the posterior aspect of the olecranon process of the proximal ulna. No additional  sites of bone marrow edema. No discrete erosion. No marrow replacing bone lesion. Ligaments Not well assessed at the edges of the field of view. Muscles and Tendons Normal muscle bulk and signal intensity without edema, atrophy, or fatty infiltration. Mild flexor tenosynovitis the distal forearm and wrist proximal to the carpal tunnel. Trace tenosynovial fluid associated with extensor compartment 2 at the wrist. Soft tissues Circumferential subcutaneous edema and fluid throughout the forearm, more pronounced dorsally. Fluid collection within the soft tissues overlying the olecranon, to be described on dedicated elbow MRI. No additional organized collections within the forearm as evident on noncontrast images. IMPRESSION: 1. Circumferential subcutaneous edema and fluid throughout the forearm, more pronounced dorsally. Appearance most compatible with cellulitis. 2. Fluid collection at the posterior elbow with mild bone marrow edema within the olecranon process of the proximal ulna. See dedicated MRI elbow report for full detail. 3. Mild flexor tenosynovitis of the distal forearm and wrist. Trace tenosynovial fluid associated with extensor compartment 2 at the wrist Electronically Signed   By: Mabel Converse D.O.    On: 10/17/2023 08:20   DG Forearm Right Result Date: 10/16/2023 CLINICAL DATA:  Abscess. EXAM: RIGHT FOREARM - 2 VIEW COMPARISON:  None Available. FINDINGS: No fracture. No erosive or bony destructive change. Wrist and elbow alignment are maintained. No elbow joint effusion. Diffuse in generalized subcutaneous edema throughout the forearm. No soft tissue gas or radiopaque foreign body. IMPRESSION: Diffuse soft tissue edema throughout the forearm. No soft tissue gas or radiopaque foreign body. No radiographic findings of osteomyelitis. Electronically Signed   By: Andrea Gasman M.D.   On: 10/16/2023 15:45   US  EKG SITE RITE Result Date: 10/13/2023 If Site Rite image not attached, placement could not be confirmed due to current cardiac rhythm.  DG Elbow Complete Right Result Date: 10/13/2023 CLINICAL DATA:  concern for septic joint and/or f/b, R elbow redness, swelling, heat, pain, TTP, wound EXAM: RIGHT ELBOW - COMPLETE 3+ VIEW COMPARISON:  None Available. FINDINGS: No cortical erosion or destruction. There is no evidence of fracture, dislocation, or joint effusion. There is no evidence of arthropathy or other focal bone abnormality. Posterior elbow subcutaneus soft tissue edema and emphysema. Otherwise diffuse subcutaneus soft tissue edema. No definite retained radiopaque foreign body along the posterior elbow. Possible retained foreign body versus bilobed ossific density measuring 6 mm along the antecubital fossa. IMPRESSION: 1. Posterior elbow subcutaneus soft tissue edema and emphysema. Otherwise diffuse subcutaneus soft tissue edema. No definite retained radiopaque foreign body along the posterior elbow. Limited evaluation due to overlapping osseous structures and overlying soft tissues. 2. Possible retained foreign body versus bilobed ossific density measuring 6 mm along the antecubital fossa. Electronically Signed   By: Morgane  Naveau M.D.   On: 10/13/2023 08:22     The results of significant  diagnostics from this hospitalization (including imaging, microbiology, ancillary and laboratory) are listed below for reference.     Microbiology: Recent Results (from the past 240 hours)  Surgical pcr screen     Status: None   Collection Time: 10/17/23 11:54 AM   Specimen: Nasal Mucosa; Nasal Swab  Result Value Ref Range Status   MRSA, PCR NEGATIVE NEGATIVE Final   Staphylococcus aureus NEGATIVE NEGATIVE Final    Comment: (NOTE) The Xpert SA Assay (FDA approved for NASAL specimens in patients 37 years of age and older), is one component of a comprehensive surveillance program. It is not intended to diagnose infection nor to guide or monitor treatment. Performed at [facility], [address], [place], [place] [address]   Aerobic/Anaerobic Culture w Gram Stain (surgical/deep wound)     Status: None   Collection Time: 10/17/23  1:45 PM   Specimen: Path fluid; Body Fluid  Result Value Ref Range Status   Specimen Description ABSCESS  Final   Special Requests RT ELBOW PT ON VANC ANCEF   Final   Gram Stain NO WBC SEEN NO ORGANISMS SEEN   Final   Culture   Final    No growth aerobically or anaerobically. Performed at [facility], [address], [place], [place] [address]    Report Status 10/22/2023 FINAL  Final  Aerobic/Anaerobic Culture w Gram Stain (surgical/deep wound)     Status: None   Collection Time: 10/17/23  1:45 PM   Specimen: Path Tissue  Result Value Ref Range Status   Specimen Description TISSUE  Final   Special Requests RT ELBOW PT ON ANCEF  VANC  Final   Gram Stain   Final    RARE WBC PRESENT, PREDOMINANTLY PMN NO ORGANISMS SEEN    Culture   Final    RARE STREPTOCOCCUS GROUP G Beta hemolytic streptococci are predictably susceptible to penicillin and other beta lactams. Susceptibility testing not routinely performed. NO ANAEROBES ISOLATED Performed at [facility], [address] [place], [place] [address]    Report Status 10/22/2023 FINAL   Final     Labs: BNP (last 3 results) No results for input(s): BNP in the last 8760 hours. Basic Metabolic Panel: Recent Labs  Lab 10/24/23 0328  NA 137  K 4.1  CL 102  CO2 27  GLUCOSE 103*  BUN 29*  CREATININE 0.62  CALCIUM  9.0  MG 2.2   Liver Function Tests: No results for input(s): AST, ALT, ALKPHOS, BILITOT, PROT, ALBUMIN in the last 168 hours. No results for input(s): LIPASE, AMYLASE in the last 168 hours. No results for input(s): AMMONIA in the last 168 hours. CBC: Recent Labs  Lab 10/24/23 0328  WBC 9.3  HGB 12.7*  HCT 39.5  MCV 91.0  PLT 423*   Cardiac Enzymes: No results for input(s): CKTOTAL, CKMB, CKMBINDEX, TROPONINI in the last 168 hours. BNP: Invalid input(s): POCBNP CBG: No results for input(s): GLUCAP in the last 168 hours. D-Dimer No results for input(s): DDIMER in the last 72 hours. Hgb A1c No results for input(s): HGBA1C in the last 72 hours. Lipid Profile No results for input(s): CHOL, HDL, LDLCALC, TRIG, CHOLHDL, LDLDIRECT in the last 72 hours. Thyroid function studies No results for input(s): TSH, T4TOTAL, T3FREE, THYROIDAB in the last 72 hours.  Invalid input(s): FREET3 Anemia work up No results for input(s): VITAMINB12, FOLATE, FERRITIN, TIBC, IRON, RETICCTPCT in the last 72 hours. Urinalysis No results found for: COLORURINE, APPEARANCEUR, LABSPEC, PHURINE, GLUCOSEU, HGBUR, BILIRUBINUR, KETONESUR, PROTEINUR, UROBILINOGEN, NITRITE, LEUKOCYTESUR Sepsis Labs Recent Labs  Lab 10/24/23 0328  WBC 9.3   Microbiology Recent Results (from the past 240 hours)  Surgical pcr screen     Status: None   Collection Time: 10/17/23 11:54 AM   Specimen: Nasal Mucosa; Nasal Swab  Result Value Ref Range Status   MRSA, PCR NEGATIVE NEGATIVE Final   Staphylococcus aureus NEGATIVE NEGATIVE Final    Comment: (NOTE) The Xpert SA Assay (FDA approved for NASAL  specimens in patients 67 years of age and older), is one component of a comprehensive surveillance program. It is not intended to diagnose infection nor to guide or monitor treatment. Performed at [facility],  [address] [place], [place] [address]   Aerobic/Anaerobic Culture w Gram Stain (surgical/deep wound)     Status: None   Collection Time: 10/17/23  1:45 PM   Specimen: Path fluid; Body Fluid  Result Value Ref Range Status   Specimen Description ABSCESS  Final   Special Requests RT ELBOW PT ON VANC ANCEF   Final   Gram Stain NO WBC SEEN NO ORGANISMS SEEN   Final   Culture   Final    No growth aerobically or anaerobically. Performed at [facility], [address], [place], [place] [address]    Report Status 10/22/2023 FINAL  Final  Aerobic/Anaerobic Culture w Gram Stain (surgical/deep wound)     Status: None   Collection Time: 10/17/23  1:45 PM   Specimen: Path Tissue  Result Value Ref Range Status   Specimen Description TISSUE  Final   Special Requests RT ELBOW PT ON ANCEF  VANC  Final   Gram Stain   Final    RARE WBC PRESENT, PREDOMINANTLY PMN NO ORGANISMS SEEN    Culture   Final    RARE STREPTOCOCCUS GROUP G Beta hemolytic streptococci are predictably susceptible to penicillin and other beta lactams. Susceptibility testing not routinely performed. NO ANAEROBES ISOLATED Performed at [facility], [address], [place], [place] [address]    Report Status 10/22/2023 FINAL  Final     Time coordinating discharge:  I have spent 35 minutes face to face with the patient and on the ward discussing the patients care, assessment, plan and disposition with other care givers. >50% of the time was devoted counseling the patient about the risks and benefits of treatment/Discharge disposition and coordinating care.   SIGNED:   Burgess JAYSON Dare, MD  [facility] 10/26/2023, 11:28 AM   If 7PM-7AM, please contact night-coverage

## 2023-10-26 NOTE — Progress Notes (Signed)
 TOC discharge meds in a secure bag to Trevor [name]- pt's nurse. Pt refusing olanzapine  - outpt pharmacy updated

## 2023-10-26 NOTE — Telephone Encounter (Signed)
 Pharmacy Patient Advocate Encounter   Received notification from CoverMyMeds that prior authorization for OLANZAPINE  5MG  is required/requested.   Insurance verification completed.   The patient is insured through [facility] .   Per test claim: PA required; PA submitted to above mentioned insurance via CoverMyMeds Key/confirmation #/EOC [id number] Status is pending

## 2023-10-26 NOTE — Telephone Encounter (Signed)
 Pharmacy Patient Advocate Encounter  Received notification from [facility] that Prior Authorization for Olanzapine  5mg  has been DENIED.  Full denial letter will be uploaded to the media tab. See denial reason below.  Insurance requires pt to use 10mg  tablets.   PA #/Case ID/Reference #: [id number]

## 2023-10-27 NOTE — Telephone Encounter (Signed)
 Copied from CRM [id number]. Topic: Appointments - Appointment Scheduling >> Oct 26, 2023 11:51 AM May Sparks wrote: Hospital follow up needed, within 7 to 14 days per Maryan Smalling Social worker [place].  Please call patient

## 2023-11-01 NOTE — Telephone Encounter (Signed)
Uanble to reach. No answer or voicemail set up.

## 2023-11-03 NOTE — Telephone Encounter (Signed)
Uanble to reach. No answer or voicemail set up.

## 2023-11-05 ENCOUNTER — Emergency Department (HOSPITAL_COMMUNITY)
Admission: EM | Admit: 2023-11-05 | Discharge: 2023-11-06 | Disposition: A | Discharge status: Home / Self Care | Payer: BLUE CROSS/BLUE SHIELD | Attending: Emergency Medicine | Admitting: Emergency Medicine

## 2023-11-05 ENCOUNTER — Other Ambulatory Visit: Payer: Self-pay

## 2023-11-05 ENCOUNTER — Encounter (HOSPITAL_COMMUNITY): Payer: Self-pay

## 2023-11-05 DIAGNOSIS — Z4802 Encounter for removal of sutures: Secondary | ICD-10-CM | POA: Insufficient documentation

## 2023-11-05 DIAGNOSIS — S0083XA Contusion of other part of head, initial encounter: Secondary | ICD-10-CM | POA: Insufficient documentation

## 2023-11-05 DIAGNOSIS — S0993XA Unspecified injury of face, initial encounter: Secondary | ICD-10-CM

## 2023-11-05 DIAGNOSIS — R220 Localized swelling, mass and lump, head: Secondary | ICD-10-CM | POA: Diagnosis present

## 2023-11-05 DIAGNOSIS — S02401A Maxillary fracture, unspecified, initial encounter for closed fracture: Secondary | ICD-10-CM

## 2023-11-05 LAB — BASIC METABOLIC PANEL
Anion gap: 14 (ref 5–15)
BUN: 18 mg/dL (ref 6–20)
CO2: 24 mmol/L (ref 22–32)
Calcium: 8.9 mg/dL (ref 8.9–10.3)
Chloride: 99 mmol/L (ref 98–111)
Creatinine, Ser: 1 mg/dL (ref 0.61–1.24)
GFR, Estimated: >60 mL/min (ref >60)
Glucose, Bld: 66 mg/dL — ABNORMAL LOW (ref 70–99)
Potassium: 3.1 mmol/L — ABNORMAL LOW (ref 3.5–5.1)
Sodium: 137 mmol/L (ref 135–145)

## 2023-11-05 LAB — CBC WITH DIFFERENTIAL/PLATELET
Abs Immature Granulocytes: 0.02 10*3/uL (ref 0.00–0.07)
Basophils Absolute: 0 10*3/uL (ref 0.0–0.1)
Basophils Relative: 0 %
Eosinophils Absolute: 0.1 10*3/uL (ref 0.0–0.5)
Eosinophils Relative: 1 %
HCT: 34.8 % — ABNORMAL LOW (ref 39.0–52.0)
Hemoglobin: 11.4 g/dL — ABNORMAL LOW (ref 13.0–17.0)
Immature Granulocytes: 0 %
Lymphocytes Relative: 14 %
Lymphs Abs: 1.3 10*3/uL (ref 0.7–4.0)
MCH: 29.7 pg (ref 26.0–34.0)
MCHC: 32.8 g/dL (ref 30.0–36.0)
MCV: 90.6 fL (ref 80.0–100.0)
Monocytes Absolute: 0.7 10*3/uL (ref 0.1–1.0)
Monocytes Relative: 8 %
Neutro Abs: 7 10*3/uL (ref 1.7–7.7)
Neutrophils Relative %: 77 %
Platelets: 300 10*3/uL (ref 150–400)
RBC: 3.84 MIL/uL — ABNORMAL LOW (ref 4.22–5.81)
RDW: 14 % (ref 11.5–15.5)
WBC: 9.2 10*3/uL (ref 4.0–10.5)
nRBC: 0 % (ref 0.0–0.2)

## 2023-11-05 MED ORDER — POTASSIUM CHLORIDE CRYS ER 20 MEQ PO TBCR
40.0000 meq | EXTENDED_RELEASE_TABLET | Freq: Once | ORAL | Status: AC
  Administered 2023-11-05: 40 meq via ORAL
  Filled 2023-11-05: qty 2
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
## 2023-11-06 ENCOUNTER — Emergency Department (HOSPITAL_COMMUNITY): Payer: BLUE CROSS/BLUE SHIELD

## 2023-11-06 DIAGNOSIS — S0083XA Contusion of other part of head, initial encounter: Secondary | ICD-10-CM | POA: Diagnosis not present

## 2023-11-06 MED ORDER — CEFADROXIL 500 MG PO CAPS: 500.0000 mg | ORAL_CAPSULE | Freq: Two times a day (BID) | ORAL | 0 refills | Status: DC

## 2023-11-06 MED ORDER — NAPROXEN 375 MG PO TABS: 375.0000 mg | ORAL_TABLET | Freq: Two times a day (BID) | ORAL | 0 refills | Status: DC

## 2023-11-06 NOTE — Discharge Instructions (Addendum)
Contact a health care provider if: You have chills or a fever. You notice any signs of infection. These include redness, increased pain, or foul-smelling discharge. Your pain is not controlled with medicine. Get help right away if: You have trouble breathing. You have a sudden increase in swelling. You need to cut the wires off your teeth because of vomiting. These symptoms may represent a serious problem that is an emergency. Do not wait to see if the symptoms will go away. Get medical help right away. Call your local emergency services (911 in the [place] Do not drive yourself to the [facility]
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
## 2023-11-21 ENCOUNTER — Ambulatory Visit: Payer: BLUE CROSS/BLUE SHIELD | Admitting: Infectious Disease

## 2023-11-21 DIAGNOSIS — F1520 Other stimulant dependence, uncomplicated: Secondary | ICD-10-CM

## 2023-11-21 DIAGNOSIS — D649 Anemia, unspecified: Secondary | ICD-10-CM

## 2024-01-12 ENCOUNTER — Emergency Department (HOSPITAL_COMMUNITY)
Admission: EM | Admit: 2024-01-12 | Discharge: 2024-01-13 | Discharge status: Against Medical Advice | Payer: MEDICAID | Attending: Emergency Medicine | Admitting: Emergency Medicine

## 2024-01-12 ENCOUNTER — Other Ambulatory Visit: Payer: Self-pay

## 2024-01-12 ENCOUNTER — Encounter (HOSPITAL_COMMUNITY): Payer: Self-pay | Admitting: Emergency Medicine

## 2024-01-12 DIAGNOSIS — F3163 Bipolar disorder, current episode mixed, severe, without psychotic features: Secondary | ICD-10-CM | POA: Diagnosis not present

## 2024-01-12 DIAGNOSIS — F332 Major depressive disorder, recurrent severe without psychotic features: Secondary | ICD-10-CM | POA: Insufficient documentation

## 2024-01-12 DIAGNOSIS — F419 Anxiety disorder, unspecified: Secondary | ICD-10-CM | POA: Diagnosis not present

## 2024-01-12 DIAGNOSIS — R45851 Suicidal ideations: Secondary | ICD-10-CM | POA: Insufficient documentation

## 2024-01-12 DIAGNOSIS — F1299 Cannabis use, unspecified with unspecified cannabis-induced disorder: Secondary | ICD-10-CM | POA: Insufficient documentation

## 2024-01-12 DIAGNOSIS — Z720 Tobacco use: Secondary | ICD-10-CM | POA: Diagnosis not present

## 2024-01-12 DIAGNOSIS — Z5329 Procedure and treatment not carried out because of patient's decision for other reasons: Secondary | ICD-10-CM | POA: Insufficient documentation

## 2024-01-12 DIAGNOSIS — F15959 Other stimulant use, unspecified with stimulant-induced psychotic disorder, unspecified: Secondary | ICD-10-CM | POA: Diagnosis not present

## 2024-01-12 DIAGNOSIS — Y900 Blood alcohol level of less than 20 mg/100 ml: Secondary | ICD-10-CM | POA: Insufficient documentation

## 2024-01-12 DIAGNOSIS — R000 Tachycardia, unspecified: Secondary | ICD-10-CM | POA: Diagnosis not present

## 2024-01-12 LAB — CBC
HCT: 46.8 % (ref 39.0–52.0)
Hemoglobin: 15.3 g/dL (ref 13.0–17.0)
MCH: 28.9 pg (ref 26.0–34.0)
MCHC: 32.7 g/dL (ref 30.0–36.0)
MCV: 88.3 fL (ref 80.0–100.0)
Platelets: 307 10*3/uL (ref 150–400)
RBC: 5.3 MIL/uL (ref 4.22–5.81)
RDW: 13.5 % (ref 11.5–15.5)
WBC: 11.9 10*3/uL — ABNORMAL HIGH (ref 4.0–10.5)
nRBC: 0 % (ref 0.0–0.2)

## 2024-01-12 NOTE — ED Triage Notes (Signed)
 Pt reports he is having suicidal thoughs and the voices are becoming more intense.  Hx of suicide attempts and drug abuse, last used today meth.  States he has been sleeping too much and has become very depressed.  He has been off his meds for 4 months but does not remember what he used to take.

## 2024-01-12 NOTE — ED Provider Notes (Signed)
 [place] EMERGENCY DEPARTMENT AT [facility] Provider Note   CSN: [id number] Arrival date & time: 01/12/24  2154     History {Add pertinent medical, surgical, social history, OB history to HPI:1} Chief Complaint  Patient presents with   Suicidal    Trevor [name] is a 43 y.o. male.  The history is provided by the patient and medical records.  Trevor [name] is a 43 y.o. male who presents to the Emergency Department complaining of suicidal ideation.  He presents to the emergency department voluntarily for mental health assistance.  He reports intent to harm himself but declines to comment on a plan.  He states that he does not want to repeat himself and only wants to talk to the mental health provider.  He denies any chest pain, difficulty breathing, nausea, vomiting, fevers.  He does have a history of MSSA bacteremia.  He does not currently take any medications.  He does use tobacco.  He injects methamphetamines.  Last use was today.     Home Medications Prior to Admission medications   Medication Sig Start Date End Date Taking? Authorizing Provider  ALPRAZolam  (XANAX ) 0.5 MG tablet Take 1 tablet (0.5 mg total) by mouth 2 (two) times daily as needed for anxiety (2nd line). 10/26/23   Amin, Ankit C, MD  amLODipine  (NORVASC ) 5 MG tablet Take 1 tablet (5 mg total) by mouth daily. 10/26/23   Amin, Ankit C, MD  busPIRone  (BUSPAR ) 7.5 MG tablet Take 1 tablet (7.5 mg total) by mouth 3 (three) times daily. 10/12/23   Afton Albright, MD  busPIRone  (BUSPAR ) 7.5 MG tablet Take 7.5 mg by mouth 3 (three) times daily.    [provider]  cefadroxil  (DURICEF) 500 MG capsule Take 1 capsule (500 mg total) by mouth 2 (two) times daily. 11/06/23   Harris, Abigail, PA-C  escitalopram  (LEXAPRO ) 10 MG tablet Take 1 tablet (10 mg total) by mouth daily. 10/12/23   Afton Albright, MD  escitalopram  (LEXAPRO ) 10 MG tablet Take 10 mg by mouth 2 (two) times daily.    [provider]  gabapentin  (NEURONTIN ) 100 MG capsule Take 1 capsule (100 mg total) by mouth 2 (two) times daily. 10/26/23   Amin, Ankit C, MD  hydrOXYzine  (ATARAX ) 50 MG tablet Take 1 tablet (50 mg total) by mouth every 4 (four) hours as needed for anxiety. 10/12/23   Afton Albright, MD  hydrOXYzine  (ATARAX ) 50 MG tablet Take 1 tablet (50 mg total) by mouth 2 (two) times daily as needed for anxiety. 10/26/23   Amin, Ankit C, MD  ibuprofen  (ADVIL ) 800 MG tablet Take 800 mg by mouth every 8 (eight) hours as needed for moderate pain (pain score 4-6).    [provider]  methocarbamol  (ROBAXIN ) 750 MG tablet Take 1 tablet (750 mg total) by mouth every 8 (eight) hours as needed for muscle spasms (or post-surgical pain). 10/26/23   Maggie Schooner, MD  naproxen  (NAPROSYN ) 375 MG tablet Take 1 tablet (375 mg total) by mouth 2 (two) times daily with a meal. 11/06/23   Harris, Abigail, PA-C  nicotine  polacrilex (NICORETTE ) 2 MG gum Take 1 each (2 mg total) by mouth as needed for smoking cessation. 09/26/23   [name], Elana Grayer, MD  OLANZapine  (ZYPREXA ) 5 MG tablet Take 1 tablet (5 mg total) by mouth 2 (two) times daily. 10/26/23   Maggie Schooner, MD      Allergies    Augmentin [amoxicillin-pot clavulanate] and Penicillins  Review of Systems   Review of Systems  All other systems reviewed and are negative.   Physical Exam Updated Vital Signs BP (!) 126/99 (BP Location: Right Arm)   Pulse (!) 131   Temp 98.7 F (37.1 C)   Resp 18   SpO2 98%  Physical Exam Vitals and nursing note reviewed.  Constitutional:      Appearance: He is well-developed.  HENT:     Head: Normocephalic and atraumatic.  Cardiovascular:     Rate and Rhythm: Regular rhythm. Tachycardia present.     Heart sounds: No murmur heard. Pulmonary:     Effort: Pulmonary effort is normal. No respiratory distress.     Breath sounds: Normal breath sounds.  Abdominal:     Palpations: Abdomen is soft.     Tenderness: There is no abdominal  tenderness. There is no guarding or rebound.  Musculoskeletal:        General: No tenderness.  Skin:    General: Skin is warm and dry.  Neurological:     Mental Status: He is alert and oriented to person, place, and time.  Psychiatric:     Comments: Anxious appearing.  Poor eye contact.  Does not appear to be responding to internal stimuli.     ED Results / Procedures / Treatments   Labs (all labs ordered are listed, but only abnormal results are displayed) Labs Reviewed  CBC - Abnormal; Notable for the following components:      Result Value   WBC 11.9 (*)    All other components within normal limits  COMPREHENSIVE METABOLIC PANEL WITH GFR  ETHANOL  RAPID URINE DRUG SCREEN, HOSP PERFORMED    EKG None  Radiology No results found.  Procedures Procedures  {Document cardiac monitor, telemetry assessment procedure when appropriate:1}  Medications Ordered in ED Medications - No data to display  ED Course/ Medical Decision Making/ A&P   {   Click here for ABCD2, HEART and other calculatorsREFRESH Note before signing :1}                              Medical Decision Making Amount and/or Complexity of Data Reviewed Labs: ordered.   ***  {Document critical care time when appropriate:1} {Document review of labs and clinical decision tools ie heart score, Chads2Vasc2 etc:1}  {Document your independent review of radiology images, and any outside records:1} {Document your discussion with family members, caretakers, and with consultants:1} {Document social determinants of health affecting pt's care:1} {Document your decision making why or why not admission, treatments were needed:1} Final Clinical Impression(s) / ED Diagnoses Final diagnoses:  None    Rx / DC Orders ED Discharge Orders     None

## 2024-01-13 ENCOUNTER — Emergency Department (HOSPITAL_COMMUNITY)
Admission: EM | Admit: 2024-01-13 | Discharge: 2024-01-16 | Disposition: A | Discharge status: Other Acute Inpatient Hospital | Payer: MEDICAID | Attending: Emergency Medicine | Admitting: Emergency Medicine

## 2024-01-13 DIAGNOSIS — F1210 Cannabis abuse, uncomplicated: Secondary | ICD-10-CM | POA: Diagnosis not present

## 2024-01-13 DIAGNOSIS — F319 Bipolar disorder, unspecified: Secondary | ICD-10-CM | POA: Diagnosis present

## 2024-01-13 DIAGNOSIS — F1910 Other psychoactive substance abuse, uncomplicated: Secondary | ICD-10-CM | POA: Diagnosis present

## 2024-01-13 DIAGNOSIS — F3163 Bipolar disorder, current episode mixed, severe, without psychotic features: Secondary | ICD-10-CM | POA: Diagnosis not present

## 2024-01-13 DIAGNOSIS — R45851 Suicidal ideations: Secondary | ICD-10-CM | POA: Diagnosis not present

## 2024-01-13 DIAGNOSIS — F1510 Other stimulant abuse, uncomplicated: Secondary | ICD-10-CM | POA: Insufficient documentation

## 2024-01-13 LAB — COMPREHENSIVE METABOLIC PANEL WITH GFR
ALT: 24 U/L (ref 0–44)
AST: 26 U/L (ref 15–41)
Albumin: 4.2 g/dL (ref 3.5–5.0)
Alkaline Phosphatase: 54 U/L (ref 38–126)
Anion gap: 12 (ref 5–15)
BUN: 15 mg/dL (ref 6–20)
CO2: 23 mmol/L (ref 22–32)
Calcium: 9.8 mg/dL (ref 8.9–10.3)
Chloride: 105 mmol/L (ref 98–111)
Creatinine, Ser: 1.21 mg/dL (ref 0.61–1.24)
GFR, Estimated: >60 mL/min (ref >60)
Glucose, Bld: 163 mg/dL — ABNORMAL HIGH (ref 70–99)
Potassium: 3.5 mmol/L (ref 3.5–5.1)
Sodium: 140 mmol/L (ref 135–145)
Total Bilirubin: 0.6 mg/dL (ref 0.0–1.2)
Total Protein: 7.1 g/dL (ref 6.5–8.1)

## 2024-01-13 LAB — ETHANOL: Alcohol, Ethyl (B): <15 mg/dL (ref <15)

## 2024-01-13 LAB — RAPID URINE DRUG SCREEN, HOSP PERFORMED
Amphetamines: POSITIVE — AB
Barbiturates: NOT DETECTED
Benzodiazepines: NOT DETECTED
Cocaine: NOT DETECTED
Opiates: NOT DETECTED
Tetrahydrocannabinol: POSITIVE — AB

## 2024-01-13 MED ORDER — LORAZEPAM 1 MG PO TABS: 1.0000 mg | ORAL_TABLET | ORAL | Status: DC | PRN

## 2024-01-13 MED ORDER — IBUPROFEN 400 MG PO TABS: 600.0000 mg | ORAL_TABLET | Freq: Three times a day (TID) | ORAL | Status: DC | PRN

## 2024-01-13 MED ORDER — RISPERIDONE 1 MG PO TBDP: 2.0000 mg | ORAL_TABLET | Freq: Three times a day (TID) | ORAL | Status: DC | PRN

## 2024-01-13 MED ORDER — BUSPIRONE HCL 5 MG PO TABS
7.5000 mg | ORAL_TABLET | Freq: Two times a day (BID) | ORAL | Status: DC
  Administered 2024-01-13 – 2024-01-16 (×7): 7.5 mg via ORAL
  Filled 2024-01-13 (×8): qty 1.5

## 2024-01-13 MED ORDER — GABAPENTIN 100 MG PO CAPS
100.0000 mg | ORAL_CAPSULE | Freq: Two times a day (BID) | ORAL | Status: DC
Start: 2024-01-13 — End: 2024-01-16
  Administered 2024-01-13 – 2024-01-15 (×2): 100 mg via ORAL
  Filled 2024-01-13 (×6): qty 1

## 2024-01-13 MED ORDER — ZIPRASIDONE MESYLATE 20 MG IM SOLR: 20.0000 mg | INTRAMUSCULAR | Status: DC | PRN

## 2024-01-13 MED ORDER — HYDROXYZINE HCL 25 MG PO TABS: 25.0000 mg | ORAL_TABLET | Freq: Three times a day (TID) | ORAL | Status: DC | PRN

## 2024-01-13 MED ORDER — ONDANSETRON HCL 4 MG PO TABS: 4.0000 mg | ORAL_TABLET | Freq: Three times a day (TID) | ORAL | Status: DC | PRN

## 2024-01-13 MED ORDER — OLANZAPINE 5 MG PO TABS
5.0000 mg | ORAL_TABLET | Freq: Two times a day (BID) | ORAL | Status: DC
  Administered 2024-01-13 – 2024-01-16 (×7): 5 mg via ORAL
  Filled 2024-01-13 (×7): qty 1

## 2024-01-13 MED ORDER — ALUM & MAG HYDROXIDE-SIMETH 200-200-20 MG/5ML PO SUSP: 30.0000 mL | Freq: Four times a day (QID) | ORAL | Status: DC | PRN

## 2024-01-13 MED ORDER — ESCITALOPRAM OXALATE 10 MG PO TABS
10.0000 mg | ORAL_TABLET | Freq: Every day | ORAL | Status: DC
  Administered 2024-01-13 – 2024-01-16 (×4): 10 mg via ORAL
  Filled 2024-01-13 (×4): qty 1

## 2024-01-13 MED ORDER — NICOTINE 21 MG/24HR TD PT24: 21.0000 mg | MEDICATED_PATCH | Freq: Every day | TRANSDERMAL | Status: DC

## 2024-01-13 MED ORDER — ALPRAZOLAM 0.5 MG PO TABS
0.5000 mg | ORAL_TABLET | Freq: Two times a day (BID) | ORAL | Status: DC | PRN
  Administered 2024-01-13 – 2024-01-16 (×6): 0.5 mg via ORAL
  Filled 2024-01-13 (×6): qty 1

## 2024-01-13 MED ORDER — ACETAMINOPHEN 325 MG PO TABS: 650.0000 mg | ORAL_TABLET | ORAL | Status: DC | PRN

## 2024-01-13 MED ORDER — NICOTINE 14 MG/24HR TD PT24
14.0000 mg | MEDICATED_PATCH | Freq: Every day | TRANSDERMAL | Status: DC
  Administered 2024-01-13 – 2024-01-16 (×4): 14 mg via TRANSDERMAL
  Filled 2024-01-13 (×4): qty 1
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
## 2024-01-13 NOTE — ED Notes (Signed)
 Copies of inventory Zerah Hilyer placed in medical records drawer in purple along with other paperwork in pt folder.

## 2024-01-13 NOTE — ED Notes (Signed)
 Pt refused vitals
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
## 2024-01-13 NOTE — ED Notes (Signed)
 In room 14 for TTS.
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
## 2024-01-13 NOTE — TOC Progression Note (Signed)
 Transition of Care [facility]) - Progression Note    Patient Details  Name: Trevor [name] MRN: [id number] Date of Birth: 1981/02/26  Transition of Care [facility]) CM/SW Contact  Valley Gavia, LCSWA Phone Number: 01/13/2024, 10:06 PM  Clinical Narrative:     [facility] to [facility], can transfer TOMORROW-01/14/24 AFTER 9:00 am.  [facility]: [facility] Report #: [id number] Accepting MD: Dr. Reinhold Carbine  RN and MD made aware.         Expected Discharge Plan and Services                                               Social Determinants of Health (SDOH) Interventions SDOH Screenings   Food Insecurity: Food Insecurity Present (10/13/2023)  Housing: High Risk (10/13/2023)  Transportation Needs: Unmet Transportation Needs (10/13/2023)  Utilities: At Risk (10/13/2023)  Alcohol Screen: Low Risk  (09/19/2023)  Social Connections: Socially Isolated (10/13/2023)  Tobacco Use: Medium Risk (01/12/2024)    Readmission Risk Interventions    10/14/2023   11:29 AM  Readmission Risk Prevention Plan  Post Dischage Appt Complete  Medication Screening Complete  Transportation Screening Complete

## 2024-01-13 NOTE — ED Notes (Signed)
 Patient was yelling at staff and refused to listen for help , Cussing at staff very loud .
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
## 2024-01-13 NOTE — ED Notes (Signed)
 P.o meds given to [facility], receiving nurse

## 2024-01-13 NOTE — ED Notes (Signed)
 Staffing called in regards to sitter. No sitter available at this time.

## 2024-01-13 NOTE — ED Notes (Signed)
 Pt observed to be yelling at staff member and cursing.  Pt states "No food for 12 hours, nobody cares about me". This RN attempted to deescalate situation. Pt was calm with this RN. Security was notified and at bedside. Pt cursing at security. State "give me my stuff and I will leave".  Pt was escorted out of ER by security.  Belongings returned per security

## 2024-01-13 NOTE — ED Notes (Signed)
MD notified of pt leaving AMA.

## 2024-01-13 NOTE — TOC Initial Note (Signed)
 Transition of Care [facility]) - Initial/Assessment Note    Patient Details  Name: Trevor [name] MRN: [id number] Date of Birth: 1981/08/07  [facility] CM/SW Contact:    Valley Gavia, LCSWA Phone Number: 01/13/2024, 9:00 PM  Clinical Narrative:                  Pt meets criteria for inpatient admission per [facility], PMHNP, per [facility] Deno Flair, no available beds, pt faxed out to the following facilities:  [facility] Pending - Request Ricky Charter [place] [address] [phone] --  [facility] Pending - Request Sent -- [name] [place] [address] [phone] --  [facility] Pending - Request Sent -- [address] [place], [place] [address] [phone] --  [facility] Pending - Request Sent -- [address] [place] [address] [phone] --  [facility] Pending - Request Sent -- [address] [place] [address] [phone] --  [facility] Pending - Request Sent -- [address] [place] [address] [phone] --  [facility] Pending - Request Sent -- [address], [place] [address] [phone] --  [facility] Pending - Request Sent -- [address], [place] [address] [phone] --  [facility] Pending - Request Sent -- [address] [place] [address] [phone] --  [facility] Pending - Request Sent -- [address] Laneta Pintos [address] [place] [address] [phone] --  [facility] Pending - Request Sent -- [address] [place] [address] [phone] --  [facility] Pending - Request Sent -- [address], [place] [address] [phone] --  [facility] Pending - Request Sent -- [address] [place]rome [place] [address] [phone] --  [facility] Pending - Request Sent -- [address], [place] [address] [phone] --  [facility] Pending - Request Sent -- [address] [place] [address] [phone] --  [facility] Pending - Request Sent -- [address] [place] [address] [phone] --  [facility] Pending - Request Sent -- [address] [name] [address] [phone] --          Patient Goals and CMS Choice            Expected Discharge Plan and Services                                              Prior Living Arrangements/Services                       Activities of Daily Living      Permission Sought/Granted                  Emotional Assessment              Admission diagnosis:  si thoughts Patient Active Problem List   Diagnosis Date Noted   Methamphetamine dependence (HCC) 10/21/2023   History of intravenous drug abuse 10/19/2023   Osteomyelitis of left arm (HCC) 10/19/2023   Septic olecranon bursitis  of right elbow 10/19/2023   Hypokalemia 10/14/2023   Normocytic anemia 10/14/2023   Polysubstance abuse (HCC) 10/14/2023   Cellulitis of right elbow 10/13/2023   Bipolar disorder (manic depression) (HCC) 09/19/2023   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   Bipolar disorder, mixed (HCC) 08/31/2023   Methamphetamine dependence (HCC) 08/30/2023   Cannabis abuse, continuous use 08/30/2023   Tobacco abuse 08/05/2023   Polysubstance abuse (HCC) 08/05/2023   Cocaine use disorder, severe, dependence (HCC) 07/25/2018   Bipolar I  disorder, most recent episode depressed (HCC) 07/24/2018   PCP:  Patient, No Pcp Per Pharmacy:   [id number] [place], [place] - [address] AT [facility] [address] [place] [address] Phone: [phone] Fax: [phone]  [id number] [place], [place] - [address] AT [facility] [address] Jonette Nestle [place] [address] Phone: [phone] Fax: [phone]  [facility] [address], [place] - [address] [place] [address] Phone: [phone] Fax: [phone]  [facility] [address] [place] [address] Phone: [phone] Fax: [phone]     Social Drivers of Health (SDOH) Social History: SDOH Screenings   Food Insecurity: Food Insecurity Present (10/13/2023)  Housing: High Risk (10/13/2023)  Transportation Needs: Unmet Transportation Needs (10/13/2023)  Utilities: At Risk (10/13/2023)  Alcohol Screen: Low Risk  (09/19/2023)  Social Connections: Socially Isolated (10/13/2023)  Tobacco Use: Medium Risk (01/12/2024)   SDOH Interventions:     Readmission Risk Interventions    10/14/2023   11:29 AM  Readmission Risk Prevention Plan  Post Dischage Appt Complete  Medication Screening Complete  Transportation Screening Complete

## 2024-01-14 ENCOUNTER — Encounter (HOSPITAL_COMMUNITY): Payer: Self-pay | Admitting: Emergency Medicine

## 2024-01-14 MED ORDER — QUETIAPINE FUMARATE 100 MG PO TABS
200.0000 mg | ORAL_TABLET | Freq: Every day | ORAL | Status: DC
  Administered 2024-01-14 – 2024-01-15 (×2): 200 mg via ORAL
  Filled 2024-01-14 (×2): qty 2

## 2024-01-14 NOTE — ED Notes (Signed)
 Patient given graham crackers and coffee

## 2024-01-14 NOTE — ED Notes (Signed)
 Trevor [name] called nursing station.  Per Rexene Catching, the patient has been denied.

## 2024-01-14 NOTE — ED Notes (Addendum)
 Trevor [name]

## 2024-01-14 NOTE — ED Notes (Signed)
 Patient has been alert but mostly sleeping comfortably this shift.

## 2024-01-14 NOTE — ED Notes (Signed)
 Patient has been cooperative this shift. Quiet and guarded. Patient endorsed intermittent suicidal ideation.  Patient has bee medication compliant.

## 2024-01-14 NOTE — ED Notes (Addendum)
 Attempted to call safe transport, but went immediately to VM, will try again later.

## 2024-01-14 NOTE — ED Provider Notes (Signed)
 Emergency Medicine Observation Re-evaluation Note  Trevor [name] is a 43 y.o. male, seen on rounds today.  Pt initially presented to the ED for complaints of Suicidal Currently, the patient is resting  Physical Exam  BP 114/80 (BP Location: Left Arm)   Pulse 84   Temp 97.7 F (36.5 C) (Oral)   Resp 20   Ht 6' (1.829 m)   Wt 70.3 kg   SpO2 100%   BMI 21.02 kg/m  Physical Exam General: NAD Cardiac: Regular HR Lungs: No resp distress   ED Course / MDM  EKG:EKG Interpretation Date/Time:  Friday January 13 2024 20:47:34 EDT Ventricular Rate:  63 PR Interval:  106 QRS Duration:  104 QT Interval:  410 QTC Calculation: 419 R Axis:   86  Text Interpretation: Sinus rhythm with short PR Otherwise normal ECG When compared with ECG of 13-Jan-2024 16:20, No significant change was found Confirmed by Alissa April ([id number]) on 01/14/2024 12:39:46 AM  I have reviewed the labs performed to date as well as medications administered while in observation.   Plan  Current plan is for inpatient psychiatric hospitalization  I've added seroquel  200 mg at bedtime to his medications per psych recommendations     Arvilla Birmingham, MD 01/14/24 1050

## 2024-01-14 NOTE — ED Notes (Signed)
 Pt provided PM snack strawberry ice pop, diet cola, graham crackers per request.

## 2024-01-14 NOTE — ED Notes (Addendum)
 Unable to get in touch with safe transport, will notify oncoming shift in regard to pt being accepted to [facility] and was unable to get in touch with transportation. Called [place] back and left HIPAA compliant VM with callback number, to inform them of the same.

## 2024-01-14 NOTE — ED Notes (Signed)
 Pt given lunch tray.

## 2024-01-14 NOTE — ED Notes (Signed)
 Patient has been alert this shift. Patient took AM medication, except refused Gabapentin .  Patient ambulatory and can do all ADLs.

## 2024-01-14 NOTE — ED Notes (Addendum)
 [facility] called, stating that the pt was supposed to be transferred last night and that he was accepted. Informed intake that there are no notes about any [facility]. [facility] states the pt can arrive tonight if transportation is available, will call and inquire with transportation and call them back to let them know.

## 2024-01-15 DIAGNOSIS — F3163 Bipolar disorder, current episode mixed, severe, without psychotic features: Secondary | ICD-10-CM | POA: Diagnosis not present

## 2024-01-15 NOTE — ED Notes (Signed)
Pt given a snack 

## 2024-01-15 NOTE — ED Provider Notes (Signed)
 Emergency Medicine Observation Re-evaluation Note  Bric Rardin is a 43 y.o. male, seen on rounds today.  Pt initially presented to the ED for complaints of Suicidal Currently, the patient is being no acute distress.  Physical Exam  BP (!) 112/91 (BP Location: Right Arm)   Pulse 62   Temp 97.8 F (36.6 C) (Oral)   Resp 18   Ht 6' (1.829 m)   Wt 70.3 kg   SpO2 100%   BMI 21.02 kg/m  Physical Exam General: nad Cardiac: regular Lungs: clear Psych: calm  ED Course / MDM  EKG:EKG Interpretation Date/Time:  Friday January 13 2024 20:47:34 EDT Ventricular Rate:  63 PR Interval:  106 QRS Duration:  104 QT Interval:  410 QTC Calculation: 419 R Axis:   86  Text Interpretation: Sinus rhythm with short PR Otherwise normal ECG When compared with ECG of 13-Jan-2024 16:20, No significant change was found Confirmed by Alissa April ([id number]) on 01/14/2024 12:39:46 AM  I have reviewed the labs performed to date as well as medications administered while in observation.  Recent changes in the last 24 hours include it appears that patient was accepted at [facility] [place] yesterday but never left and unclear why.  Plan  Current plan is for inpatient psychiatric placement .    Almond Army, MD 01/15/24 1213
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
## 2024-01-15 NOTE — ED Notes (Signed)
 Pt pleasant, calm, cooperative, medication compliant, only refusing gabapentin , no aggressive behavior noted.

## 2024-01-15 NOTE — Progress Notes (Signed)
 Patient has been denied by [facility] due to no appropriate beds available. Patient meets BH inpatient criteria per Chandra Come, PMHNP. Patient has been faxed out to the following facilities:   [facility] [place] [address] [phone]  [facility] [name] [place] [address] [phone]  [facility] Center-Adult [address] [place], [place] [address] [phone]  [facility] [address] [place] [address] [phone]  [facility] [address] [place] [address] [phone]  [facility] [address] [place] [address] [phone]  [facility] [address] [place] [address] [phone]  [facility] [address] [place] [address] [phone]  [facility] [address] [place] [address] [phone]  [facility] [address], [place] [address] [phone]  [facility] [address] [place] [address] [phone]  [facility] [address] [place] [address] [phone]  [facility] [address] [place] [address] [phone]  [facility] [address], [place] [address] [phone]  [facility] [address] [place], [place] [address] [phone]  [facility] [address], [place] [address] [phone]  [facility] [address] Johnella Naas [place] [address] [phone]   Phares Brasher, MSW,  LCSW-A  5:58 PM 01/15/2024

## 2024-01-16 NOTE — ED Notes (Signed)
 Patient off unit to facility per provider. Patient alert, calm, cooperative, no s/s of distress at this time. Discharge information and belongings given to [facility] for facility . Patient ambulatory off unit, escorted by NT . Patient transported by [employer].

## 2024-01-16 NOTE — ED Provider Notes (Signed)
 Emergency Medicine Observation Re-evaluation Note  Trevor [name] is a 43 y.o. male, seen on rounds today.  Pt initially presented to the ED for complaints of Suicidal Currently, the patient is sleeping.  Physical Exam  BP 97/64 (BP Location: Left Arm)   Pulse 68   Temp 97.6 F (36.4 C) (Oral)   Resp 16   Ht 6' (1.829 m)   Wt 70.3 kg   SpO2 99%   BMI 21.02 kg/m  Physical Exam General: Sleeping Cardiac: Extremities well-perfused Lungs: Breathing is unlabored Psych: Deferred  ED Course / MDM  EKG:EKG Interpretation Date/Time:  Friday January 13 2024 20:47:34 EDT Ventricular Rate:  63 PR Interval:  106 QRS Duration:  104 QT Interval:  410 QTC Calculation: 419 R Axis:   86  Text Interpretation: Sinus rhythm with short PR Otherwise normal ECG When compared with ECG of 13-Jan-2024 16:20, No significant change was found Confirmed by Alissa April ([id number]) on 01/14/2024 12:39:46 AM  I have reviewed the labs performed to date as well as medications administered while in observation.  Recent changes in the last 24 hours include reevaluation by TTS who continues to recommend inpatient psychiatric admission.  Plan  Current plan is for inpatient psychiatric admission.    Iva Mariner, MD 01/16/24 [phone]

## 2024-01-16 NOTE — Progress Notes (Signed)
 [facility] 01/16/2024 Bed assignment: DELTA UNIT   Pt meets inpatient criteria per: Chandra Come, PMHNP   Attending Physician will be: Amber Juniper NP  Report can be called to: [phone]  Pt can arrive after ASAP   Care Team Notified: Arvell Latin NP, Majorie Scrape   [place] Lonetta Blassingame LCSW-A   01/16/2024 11:16 AM

## 2024-01-16 NOTE — ED Notes (Signed)
 Pt had pleasant shift, calm, cooperative, medication compliant.

## 2024-01-16 NOTE — ED Notes (Signed)
 Pt sleeping.

## 2024-01-16 NOTE — Progress Notes (Signed)
 LCSW Progress Note  [id number]   Trevor [name]  01/16/2024  10:00 AM  Description:   Inpatient Psychiatric Referral  Patient was recommended inpatient per: Chandra Come, PMHNP  There are no available beds at [facility], per [facility] [name] RN. Patient was referred to the following out of network facilities:   Destination  Service Provider Address Phone Fax  [facility] [place] [address] [phone]  [facility] 1 [facility] [name] [place] [address] [phone]  [facility] Center-Adult [address] [place], [place] [address] [phone]  [facility] [address] [place] [address] [phone]  [facility] [address] [place] [address] [phone]  [facility] [address], [place] [address] [phone]  [facility] [address] [place] [address] [phone]  [facility] [address] [place] [address] [phone]  [facility] [address] [place] [address] [phone]  [facility] [address] [place] [address] [phone]  [facility] [address] [place] [address] [phone]  [facility] [address], [place] [address] [phone]  [facility] [address] [place] [address] [phone]  [facility] [address] [place] [address] [phone]  [facility] [address] [place], [place] [address] [phone]  [facility] [address], [place] [address] [phone]  [facility] [address] [name] [address] [phone]      Situation ongoing, CSW to continue following and update chart as more information becomes available.     [place] [name] MSW, LCSW  01/16/2024 10:00 AM

## 2024-01-28 ENCOUNTER — Emergency Department (HOSPITAL_COMMUNITY)
Admission: EM | Admit: 2024-01-28 | Discharge: 2024-01-28 | Disposition: A | Discharge status: Home / Self Care | Payer: MEDICAID | Attending: Emergency Medicine | Admitting: Emergency Medicine

## 2024-01-28 ENCOUNTER — Encounter (HOSPITAL_COMMUNITY): Payer: Self-pay

## 2024-01-28 ENCOUNTER — Other Ambulatory Visit: Payer: Self-pay

## 2024-01-28 DIAGNOSIS — F1210 Cannabis abuse, uncomplicated: Secondary | ICD-10-CM | POA: Diagnosis present

## 2024-01-28 DIAGNOSIS — F1420 Cocaine dependence, uncomplicated: Secondary | ICD-10-CM | POA: Diagnosis present

## 2024-01-28 DIAGNOSIS — R443 Hallucinations, unspecified: Secondary | ICD-10-CM

## 2024-01-28 DIAGNOSIS — F1520 Other stimulant dependence, uncomplicated: Secondary | ICD-10-CM | POA: Diagnosis present

## 2024-01-28 DIAGNOSIS — R441 Visual hallucinations: Secondary | ICD-10-CM | POA: Insufficient documentation

## 2024-01-28 DIAGNOSIS — F15950 Other stimulant use, unspecified with stimulant-induced psychotic disorder with delusions: Secondary | ICD-10-CM | POA: Insufficient documentation

## 2024-01-28 DIAGNOSIS — F15259 Other stimulant dependence with stimulant-induced psychotic disorder, unspecified: Secondary | ICD-10-CM | POA: Diagnosis not present

## 2024-01-28 DIAGNOSIS — R440 Auditory hallucinations: Secondary | ICD-10-CM | POA: Insufficient documentation

## 2024-01-28 DIAGNOSIS — F319 Bipolar disorder, unspecified: Secondary | ICD-10-CM | POA: Diagnosis present

## 2024-01-28 LAB — RAPID URINE DRUG SCREEN, HOSP PERFORMED
Amphetamines: POSITIVE — AB
Barbiturates: NOT DETECTED
Benzodiazepines: POSITIVE — AB
Cocaine: POSITIVE — AB
Opiates: NOT DETECTED
Tetrahydrocannabinol: POSITIVE — AB

## 2024-01-28 LAB — COMPREHENSIVE METABOLIC PANEL WITH GFR
ALT: 47 U/L — ABNORMAL HIGH (ref 0–44)
AST: 39 U/L (ref 15–41)
Albumin: 3.9 g/dL (ref 3.5–5.0)
Alkaline Phosphatase: 52 U/L (ref 38–126)
Anion gap: 8 (ref 5–15)
BUN: 19 mg/dL (ref 6–20)
CO2: 23 mmol/L (ref 22–32)
Calcium: 8.6 mg/dL — ABNORMAL LOW (ref 8.9–10.3)
Chloride: 107 mmol/L (ref 98–111)
Creatinine, Ser: 0.96 mg/dL (ref 0.61–1.24)
GFR, Estimated: >60 mL/min (ref >60)
Glucose, Bld: 102 mg/dL — ABNORMAL HIGH (ref 70–99)
Potassium: 3.3 mmol/L — ABNORMAL LOW (ref 3.5–5.1)
Sodium: 138 mmol/L (ref 135–145)
Total Bilirubin: 0.6 mg/dL (ref 0.0–1.2)
Total Protein: 6.8 g/dL (ref 6.5–8.1)

## 2024-01-28 LAB — ACETAMINOPHEN LEVEL: Acetaminophen (Tylenol), Serum: <10 ug/mL — ABNORMAL LOW (ref 10–30)

## 2024-01-28 LAB — CBC
HCT: 33.8 % — ABNORMAL LOW (ref 39.0–52.0)
Hemoglobin: 11.1 g/dL — ABNORMAL LOW (ref 13.0–17.0)
MCH: 29.1 pg (ref 26.0–34.0)
MCHC: 32.8 g/dL (ref 30.0–36.0)
MCV: 88.7 fL (ref 80.0–100.0)
Platelets: 215 10*3/uL (ref 150–400)
RBC: 3.81 MIL/uL — ABNORMAL LOW (ref 4.22–5.81)
RDW: 14.4 % (ref 11.5–15.5)
WBC: 5.8 10*3/uL (ref 4.0–10.5)
nRBC: 0 % (ref 0.0–0.2)

## 2024-01-28 LAB — ETHANOL: Alcohol, Ethyl (B): <15 mg/dL (ref <15)

## 2024-01-28 LAB — SALICYLATE LEVEL: Salicylate Lvl: <7 mg/dL — ABNORMAL LOW (ref 7.0–30.0)

## 2024-01-28 MED ORDER — ONDANSETRON 4 MG PO TBDP
4.0000 mg | ORAL_TABLET | Freq: Once | ORAL | Status: AC
  Administered 2024-01-28: 4 mg via ORAL
  Filled 2024-01-28: qty 1

## 2024-01-28 MED ORDER — GABAPENTIN 300 MG PO CAPS: 300.0000 mg | ORAL_CAPSULE | Freq: Three times a day (TID) | ORAL | Status: DC

## 2024-01-28 MED ORDER — IBUPROFEN 200 MG PO TABS
400.0000 mg | ORAL_TABLET | Freq: Four times a day (QID) | ORAL | Status: DC | PRN
Start: 2024-01-28 — End: 2024-01-28

## 2024-01-28 MED ORDER — OLANZAPINE 5 MG PO TABS
5.0000 mg | ORAL_TABLET | Freq: Every day | ORAL | Status: DC
  Filled 2024-01-28: qty 1

## 2024-01-28 MED ORDER — NALOXONE HCL 4 MG/0.1ML NA LIQD
NASAL | Status: AC
  Filled 2024-01-28: qty 4

## 2024-01-28 MED ORDER — POTASSIUM CHLORIDE CRYS ER 20 MEQ PO TBCR
40.0000 meq | EXTENDED_RELEASE_TABLET | Freq: Once | ORAL | Status: DC
  Filled 2024-01-28: qty 2

## 2024-01-28 MED ORDER — NALOXONE HCL 4 MG/0.1ML NA LIQD
1.0000 | Freq: Once | NASAL | Status: AC
  Administered 2024-01-28: 1 via NASAL

## 2024-01-28 MED ORDER — HYDROXYZINE HCL 25 MG PO TABS
25.0000 mg | ORAL_TABLET | Freq: Once | ORAL | Status: DC
  Filled 2024-01-28: qty 1

## 2024-01-28 NOTE — ED Triage Notes (Signed)
 Pt coming in voluntarily for visual and auditory hallucinations today. Pt also reports "feeling like everyone is out to get me". Pt denies SI/HI. Recent BH admission and discharge Monday. Was not able to fill zyprexa  prescription d/t insurance not being active and medication costing $1000+

## 2024-01-28 NOTE — Discharge Instructions (Signed)
 Evaluation today was reassuring.  Per psych team, feel that you are safe to go home, resume your home meds and follow-up with the behavioral health center.  Also recommending seeking out rehab center for polysubstance abuse.  If you have suicidal ideation, homicidal ideation, hallucinations or any other concerning symptom please return to the ED for further evaluation.
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
## 2024-01-28 NOTE — Consult Note (Signed)
 Efforts by staff members and this provider to speak with this patient for assessment failed as patient remains deeply sleeping.  He was given Narcan  earlier but still is not waking up.  Provider will try later.

## 2024-01-28 NOTE — ED Notes (Signed)
 Pt refused his temp being taken

## 2024-01-28 NOTE — ED Notes (Signed)
 Writer came to assess patient and give ordered meds and found patient to be lethargic and minimally responsive. VSS. Maralee Senate, MD and Larinda Plover, [place] notified immediately followed by writer giving IN Narcan  and zofran .

## 2024-01-28 NOTE — BH Assessment (Signed)
 TTS attempted to assess pt. Per Fabio Holts RN, pt is not cooperative and is unwilling to engage in assessment at this time. Pt will be seen in AM.

## 2024-01-28 NOTE — ED Notes (Signed)
 Patient refusing care, Pt not cooperating with staff.

## 2024-01-28 NOTE — ED Provider Notes (Addendum)
 [place] EMERGENCY DEPARTMENT AT [facility] Provider Note   CSN: [id number] Arrival date & time: 01/28/24  5638     History  Chief Complaint  Patient presents with   Hallucinations    Trevor [name] is a 43 y.o. male with medical history to include chronic pain, bipolar 1 disorder, narcotic addiction, substance abuse, cannabis use, cocaine use, methamphetamine dependence.  Patient presents to ED for evaluation of hallucinations.  Patient reports that he was discharged recently from the [facility] on Monday.  States that he has not been able to pick up medication since being discharged due to price.  Here complaining of visual and auditory hallucinations as well is paranoia.  The patient reports that he feels as if people are out to get him, want to hurt him.  He also states that he is having auditory hallucinations but states that they are not telling him anything, they are just "mumbling".  Reports that visual hallucinations include "seeing someone hurt me with a gun".  He denies any SI.  Denies any drug or alcohol use tonight but does state he has used marijuana.  Denies medical complaints.  He is requesting Xanax  on my evaluation.  HPI     Home Medications Prior to Admission medications   Medication Sig Start Date End Date Taking? Authorizing Provider  ALPRAZolam  (XANAX ) 0.5 MG tablet Take 1 tablet (0.5 mg total) by mouth 2 (two) times daily as needed for anxiety (2nd line). Patient not taking: Reported on 01/13/2024 10/26/23   Maggie Schooner, MD  amLODipine  (NORVASC ) 5 MG tablet Take 1 tablet (5 mg total) by mouth daily. Patient not taking: Reported on 01/13/2024 10/26/23   Maggie Schooner, MD  busPIRone  (BUSPAR ) 7.5 MG tablet Take 1 tablet (7.5 mg total) by mouth 3 (three) times daily. Patient not taking: Reported on 01/13/2024 10/12/23   Afton Albright, MD  cefadroxil  (DURICEF) 500 MG capsule Take 1 capsule (500 mg total) by mouth 2 (two) times daily. Patient not taking:  Reported on 01/13/2024 11/06/23   Harris, Abigail, PA-C  escitalopram  (LEXAPRO ) 10 MG tablet Take 1 tablet (10 mg total) by mouth daily. Patient not taking: Reported on 01/13/2024 10/12/23   Afton Albright, MD  gabapentin  (NEURONTIN ) 100 MG capsule Take 1 capsule (100 mg total) by mouth 2 (two) times daily. Patient not taking: Reported on 01/13/2024 10/26/23   Maggie Schooner, MD  hydrOXYzine  (ATARAX ) 50 MG tablet Take 1 tablet (50 mg total) by mouth 2 (two) times daily as needed for anxiety. Patient not taking: Reported on 01/13/2024 10/26/23   Maggie Schooner, MD  ibuprofen  (ADVIL ) 800 MG tablet Take 800 mg by mouth every 8 (eight) hours as needed for moderate pain (pain score 4-6). Patient not taking: Reported on 01/13/2024    [provider]  methocarbamol  (ROBAXIN ) 750 MG tablet Take 1 tablet (750 mg total) by mouth every 8 (eight) hours as needed for muscle spasms (or post-surgical pain). Patient not taking: Reported on 01/13/2024 10/26/23   Maggie Schooner, MD  naproxen  (NAPROSYN ) 375 MG tablet Take 1 tablet (375 mg total) by mouth 2 (two) times daily with a meal. Patient not taking: Reported on 01/13/2024 11/06/23   Harris, Abigail, PA-C  nicotine  polacrilex (NICORETTE ) 2 MG gum Take 1 each (2 mg total) by mouth as needed for smoking cessation. Patient not taking: Reported on 01/13/2024 09/26/23   Eleanore Grey, MD  OLANZapine  (ZYPREXA ) 5 MG tablet Take 1 tablet (5 mg total) by  mouth 2 (two) times daily. Patient not taking: Reported on 01/13/2024 10/26/23   Maggie Schooner, MD      Allergies    Augmentin [amoxicillin-pot clavulanate] and Penicillins    Review of Systems   Review of Systems  Psychiatric/Behavioral:  Positive for hallucinations.   All other systems reviewed and are negative.   Physical Exam Updated Vital Signs BP (!) 161/94 (BP Location: Left Arm)   Pulse 87   Temp 99 F (37.2 C) (Oral)   Resp 16   Ht 5\' 11"  (1.803 m)   Wt 72.6 kg   SpO2 98%   BMI 22.32 kg/m  Physical  Exam Vitals and nursing note reviewed.  Constitutional:      General: He is not in acute distress.    Appearance: He is well-developed.  HENT:     Head: Normocephalic and atraumatic.  Eyes:     Conjunctiva/sclera: Conjunctivae normal.  Cardiovascular:     Rate and Rhythm: Normal rate and regular rhythm.     Heart sounds: No murmur heard. Pulmonary:     Effort: Pulmonary effort is normal. No respiratory distress.     Breath sounds: Normal breath sounds.  Abdominal:     Palpations: Abdomen is soft.     Tenderness: There is no abdominal tenderness.  Musculoskeletal:        General: No swelling.     Cervical back: Neck supple.  Skin:    General: Skin is warm and dry.     Capillary Refill: Capillary refill takes less than 2 seconds.  Neurological:     Mental Status: He is alert and oriented to person, place, and time.     Comments: Patient alert and oriented x 3.  CN III through XII intact.  Intact finger-nose, heel-to-shin.  Equal grip strength upper extremities bilaterally.  Psychiatric:        Mood and Affect: Mood normal.     Comments: Patient calm and cooperative.  Not responding to internal stimuli.     ED Results / Procedures / Treatments   Labs (all labs ordered are listed, but only abnormal results are displayed) Labs Reviewed  COMPREHENSIVE METABOLIC PANEL WITH GFR - Abnormal; Notable for the following components:      Result Value   Potassium 3.3 (*)    Glucose, Bld 102 (*)    Calcium  8.6 (*)    ALT 47 (*)    All other components within normal limits  CBC - Abnormal; Notable for the following components:   RBC 3.81 (*)    Hemoglobin 11.1 (*)    HCT 33.8 (*)    All other components within normal limits  RAPID URINE DRUG SCREEN, HOSP PERFORMED - Abnormal; Notable for the following components:   Cocaine POSITIVE (*)    Benzodiazepines POSITIVE (*)    Amphetamines POSITIVE (*)    Tetrahydrocannabinol POSITIVE (*)    All other components within normal limits   SALICYLATE LEVEL - Abnormal; Notable for the following components:   Salicylate Lvl <7.0 (*)    All other components within normal limits  ACETAMINOPHEN  LEVEL - Abnormal; Notable for the following components:   Acetaminophen  (Tylenol ), Serum <10 (*)    All other components within normal limits  ETHANOL    EKG None  Radiology No results found.  Procedures Procedures    Medications Ordered in ED Medications  OLANZapine  (ZYPREXA ) tablet 5 mg (has no administration in time range)  hydrOXYzine  (ATARAX ) tablet 25 mg (25 mg Oral Not Given  01/28/24 0538)  potassium chloride  SA (KLOR-CON  M) CR tablet 40 mEq (40 mEq Oral Not Given 01/28/24 0538)  naloxone  (NARCAN ) nasal spray 4 mg/0.1 mL (1 spray Nasal Provided for home use 01/28/24 0552)  ondansetron  (ZOFRAN -ODT) disintegrating tablet 4 mg (4 mg Oral Given 01/28/24 0555)    ED Course/ Medical Decision Making/ A&P  Medical Decision Making Amount and/or Complexity of Data Reviewed Labs: ordered.  Risk Prescription drug management.   44 year old no presents for evaluation.  Please see HPI for further details.  On exam patient is afebrile and nontachycardic.  Lung sounds are clear bilaterally, not hypoxic.  Abdomen soft and compressible.  Neurological examinations at baseline.  Calm and cooperative.  Will collect screening labs.  Will provide patient with 5 mg Zyprexa  which she has not picked up from pharmacy, he was discharged to this medication on previous [facility] admission.  Patient CBC without leukocytosis, patient hemoglobin appears to be at baseline.  Metabolic panel with potassium 3.3, ALT 47, anion gap 8.  Patient potassium repleted with 40 mEq oral potassium.  Ethanol, salicylate, acetaminophen  levels undetectable.  Patient after drug screen positive for cocaine, benzodiazepines, amphetamines, THC.  Throughout course patient workup, nursing staff notified provider that patient had become more lethargic and somnolent since  arriving.  Patient reports that he apparently took some kind of medication that he reports that we gave to him however no medications that should cause sedation were administered to the patient.  Narcan  was provided and patient did seem to respond.  Patient will require further observation prior to TTS disposition.   Final Clinical Impression(s) / ED Diagnoses Final diagnoses:  Hallucinations    Rx / DC Orders ED Discharge Orders     None             Adel Aden, PA-C 01/28/24 8119    Palumbo, April, MD 01/28/24 [phone]

## 2024-01-28 NOTE — ED Provider Notes (Signed)
 Accepted handoff at shift change from Keneth Pay, PA-C. Please see prior provider note for more detail.   Briefly: Patient is 43 y.o. male presenting for hallucinations.  Denies SI and HI.  DDX: concern for acute psychosis, toxic ingestion, other  Plan: Awaiting TTS consult.  Dispo based on their recommendations.   Physical Exam  BP 120/71   Pulse (!) 55   Temp 98 F (36.7 C) (Oral)   Resp 15   Ht 5\' 11"  (1.803 m)   Wt 72.6 kg   SpO2 100%   BMI 22.32 kg/m   Physical Exam  Procedures  Procedures  ED Course / MDM   Clinical Course as of 01/28/24 1353  Sat Jan 28, 2024  0631 Here for hallucinations (visual/auditory). Became somnolent during work up. Gave Narcan . Needs to stay for TTS and metabolize. Not IVC. [JR]    Clinical Course User Index [JR] Janalee Mcmurray, PA-C   Medical Decision Making Amount and/or Complexity of Data Reviewed Labs: ordered.  Risk Prescription drug management.   After TTS evaluation, they recommended outpatient therapy and to resume all home medications.  Also recommended substance abuse treatment in the outpatient setting as well.  Discussed return precautions with him.  Feel he safe to go home.  Discharged good condition.       Janalee Mcmurray, PA-C 01/28/24 1355    Rolinda Climes, DO 01/28/24 [phone]

## 2024-01-28 NOTE — ED Notes (Addendum)
 No change in orientation after narcan / pt is still drowsy and will wake to loud speech/ vitals WNL

## 2024-01-29 ENCOUNTER — Encounter (HOSPITAL_COMMUNITY): Payer: Self-pay

## 2024-01-29 ENCOUNTER — Emergency Department (HOSPITAL_COMMUNITY)
Admission: EM | Admit: 2024-01-29 | Discharge: 2024-01-30 | Disposition: A | Discharge status: Other Acute Inpatient Hospital | Payer: MEDICAID | Attending: Emergency Medicine | Admitting: Emergency Medicine

## 2024-01-29 ENCOUNTER — Other Ambulatory Visit: Payer: Self-pay

## 2024-01-29 ENCOUNTER — Ambulatory Visit (HOSPITAL_COMMUNITY)
Admission: EM | Admit: 2024-01-29 | Discharge: 2024-01-29 | Disposition: A | Discharge status: Other Acute Inpatient Hospital | Payer: MEDICAID

## 2024-01-29 DIAGNOSIS — F319 Bipolar disorder, unspecified: Secondary | ICD-10-CM | POA: Diagnosis not present

## 2024-01-29 DIAGNOSIS — F1910 Other psychoactive substance abuse, uncomplicated: Secondary | ICD-10-CM | POA: Diagnosis present

## 2024-01-29 DIAGNOSIS — F3130 Bipolar disorder, current episode depressed, mild or moderate severity, unspecified: Secondary | ICD-10-CM | POA: Diagnosis present

## 2024-01-29 LAB — CBC WITH DIFFERENTIAL/PLATELET
Abs Immature Granulocytes: 0.03 10*3/uL (ref 0.00–0.07)
Basophils Absolute: 0 10*3/uL (ref 0.0–0.1)
Basophils Relative: 0 %
Eosinophils Absolute: 0 10*3/uL (ref 0.0–0.5)
Eosinophils Relative: 0 %
HCT: 38.8 % — ABNORMAL LOW (ref 39.0–52.0)
Hemoglobin: 12.9 g/dL — ABNORMAL LOW (ref 13.0–17.0)
Immature Granulocytes: 0 %
Lymphocytes Relative: 7 %
Lymphs Abs: 0.6 10*3/uL — ABNORMAL LOW (ref 0.7–4.0)
MCH: 28.9 pg (ref 26.0–34.0)
MCHC: 33.2 g/dL (ref 30.0–36.0)
MCV: 87 fL (ref 80.0–100.0)
Monocytes Absolute: 0.3 10*3/uL (ref 0.1–1.0)
Monocytes Relative: 3 %
Neutro Abs: 7.6 10*3/uL (ref 1.7–7.7)
Neutrophils Relative %: 90 %
Platelets: 239 10*3/uL (ref 150–400)
RBC: 4.46 MIL/uL (ref 4.22–5.81)
RDW: 13.9 % (ref 11.5–15.5)
WBC: 8.6 10*3/uL (ref 4.0–10.5)
nRBC: 0 % (ref 0.0–0.2)

## 2024-01-29 LAB — COMPREHENSIVE METABOLIC PANEL WITH GFR
ALT: 38 U/L (ref 0–44)
AST: 27 U/L (ref 15–41)
Albumin: 4 g/dL (ref 3.5–5.0)
Alkaline Phosphatase: 54 U/L (ref 38–126)
Anion gap: 12 (ref 5–15)
BUN: 9 mg/dL (ref 6–20)
CO2: 25 mmol/L (ref 22–32)
Calcium: 9.4 mg/dL (ref 8.9–10.3)
Chloride: 103 mmol/L (ref 98–111)
Creatinine, Ser: 0.83 mg/dL (ref 0.61–1.24)
GFR, Estimated: >60 mL/min (ref >60)
Glucose, Bld: 108 mg/dL — ABNORMAL HIGH (ref 70–99)
Potassium: 3.8 mmol/L (ref 3.5–5.1)
Sodium: 140 mmol/L (ref 135–145)
Total Bilirubin: 0.9 mg/dL (ref 0.0–1.2)
Total Protein: 7 g/dL (ref 6.5–8.1)

## 2024-01-29 LAB — ETHANOL: Alcohol, Ethyl (B): <15 mg/dL (ref <15)

## 2024-01-29 LAB — ACETAMINOPHEN LEVEL: Acetaminophen (Tylenol), Serum: <10 ug/mL — ABNORMAL LOW (ref 10–30)

## 2024-01-29 LAB — SALICYLATE LEVEL: Salicylate Lvl: <7 mg/dL — ABNORMAL LOW (ref 7.0–30.0)

## 2024-01-29 MED ORDER — LACTATED RINGERS IV BOLUS
1000.0000 mL | Freq: Once | INTRAVENOUS | Status: AC
  Administered 2024-01-29: 1000 mL via INTRAVENOUS

## 2024-01-29 NOTE — ED Provider Notes (Signed)
 [place] EMERGENCY DEPARTMENT AT [facility] Provider Note   CSN: [id number] Arrival date & time: 01/29/24  1935     History  Chief Complaint  Patient presents with   Medical Clearance    Trevor [name] is a 43 y.o. male.  HPI 43 year old male presents from [facility] for medical clearance. He used smoked meth today and felt like he overdosed.  He has also been feeling chronically suicidal.  Went to the behavioral health urgent care but started vomiting there and so he was sent here via EMS.  He is no longer having vomiting.  He has a little bit of residual epigastric pain that he states is significantly improving.  He denies any nausea.  He was not sick prior to taking drugs.  Home Medications Prior to Admission medications   Medication Sig Start Date End Date Taking? Authorizing Provider  ALPRAZolam  (XANAX ) 0.5 MG tablet Take 1 tablet (0.5 mg total) by mouth 2 (two) times daily as needed for anxiety (2nd line). Patient not taking: Reported on 01/13/2024 10/26/23   Maggie Schooner, MD  amLODipine  (NORVASC ) 5 MG tablet Take 1 tablet (5 mg total) by mouth daily. Patient not taking: Reported on 01/13/2024 10/26/23   Maggie Schooner, MD  busPIRone  (BUSPAR ) 7.5 MG tablet Take 1 tablet (7.5 mg total) by mouth 3 (three) times daily. Patient not taking: Reported on 01/13/2024 10/12/23   Afton Albright, MD  cefadroxil  (DURICEF) 500 MG capsule Take 1 capsule (500 mg total) by mouth 2 (two) times daily. Patient not taking: Reported on 01/13/2024 11/06/23   Harris, Abigail, PA-C  escitalopram  (LEXAPRO ) 10 MG tablet Take 1 tablet (10 mg total) by mouth daily. Patient not taking: Reported on 01/13/2024 10/12/23   Afton Albright, MD  gabapentin  (NEURONTIN ) 100 MG capsule Take 1 capsule (100 mg total) by mouth 2 (two) times daily. Patient not taking: Reported on 01/13/2024 10/26/23   Maggie Schooner, MD  hydrOXYzine  (ATARAX ) 50 MG tablet Take 1 tablet (50 mg total) by mouth 2 (two) times daily as needed for  anxiety. Patient not taking: Reported on 01/13/2024 10/26/23   Maggie Schooner, MD  ibuprofen  (ADVIL ) 800 MG tablet Take 800 mg by mouth every 8 (eight) hours as needed for moderate pain (pain score 4-6). Patient not taking: Reported on 01/13/2024    [provider]  methocarbamol  (ROBAXIN ) 750 MG tablet Take 1 tablet (750 mg total) by mouth every 8 (eight) hours as needed for muscle spasms (or post-surgical pain). Patient not taking: Reported on 01/13/2024 10/26/23   Maggie Schooner, MD  naproxen  (NAPROSYN ) 375 MG tablet Take 1 tablet (375 mg total) by mouth 2 (two) times daily with a meal. Patient not taking: Reported on 01/13/2024 11/06/23   Harris, Abigail, PA-C  nicotine  polacrilex (NICORETTE ) 2 MG gum Take 1 each (2 mg total) by mouth as needed for smoking cessation. Patient not taking: Reported on 01/13/2024 09/26/23   Eleanore Grey, MD  OLANZapine  (ZYPREXA ) 5 MG tablet Take 1 tablet (5 mg total) by mouth 2 (two) times daily. Patient not taking: Reported on 01/13/2024 10/26/23   Maggie Schooner, MD      Allergies    Augmentin [amoxicillin-pot clavulanate] and Penicillins    Review of Systems   Review of Systems  Constitutional:  Negative for fever.  Respiratory:  Negative for shortness of breath.   Cardiovascular:  Negative for chest pain.  Gastrointestinal:  Positive for abdominal pain, nausea and vomiting.  Physical Exam Updated Vital Signs BP 139/84 (BP Location: Right Arm)   Pulse 75   Temp 98.3 F (36.8 C) (Oral)   Resp 18   SpO2 100%  Physical Exam Vitals and nursing note reviewed.  Constitutional:      General: He is not in acute distress.    Appearance: He is well-developed. He is not ill-appearing or diaphoretic.  HENT:     Head: Normocephalic and atraumatic.  Cardiovascular:     Rate and Rhythm: Normal rate and regular rhythm.     Heart sounds: Normal heart sounds.  Pulmonary:     Effort: Pulmonary effort is normal.     Breath sounds: Normal breath sounds.   Abdominal:     Palpations: Abdomen is soft.     Tenderness: There is no abdominal tenderness.  Skin:    General: Skin is warm and dry.  Neurological:     Mental Status: He is alert.     ED Results / Procedures / Treatments   Labs (all labs ordered are listed, but only abnormal results are displayed) Labs Reviewed  COMPREHENSIVE METABOLIC PANEL WITH GFR - Abnormal; Notable for the following components:      Result Value   Glucose, Bld 108 (*)    All other components within normal limits  CBC WITH DIFFERENTIAL/PLATELET - Abnormal; Notable for the following components:   Hemoglobin 12.9 (*)    HCT 38.8 (*)    Lymphs Abs 0.6 (*)    All other components within normal limits  SALICYLATE LEVEL - Abnormal; Notable for the following components:   Salicylate Lvl <7.0 (*)    All other components within normal limits  ACETAMINOPHEN  LEVEL - Abnormal; Notable for the following components:   Acetaminophen  (Tylenol ), Serum <10 (*)    All other components within normal limits  ETHANOL  RAPID URINE DRUG SCREEN, HOSP PERFORMED    EKG EKG Interpretation Date/Time:  Sunday Jan 29 2024 22:27:28 EDT Ventricular Rate:  61 PR Interval:  110 QRS Duration:  102 QT Interval:  418 QTC Calculation: 420 R Axis:   79  Text Interpretation: Sinus rhythm with short PR Minimal voltage criteria for LVH, may be normal variant ( Sokolow-Lyon ) Confirmed by Jerilynn Montenegro [phone]) on 01/29/2024 10:32:43 PM  Radiology No results found.  Procedures Procedures    Medications Ordered in ED Medications  lactated ringers  bolus 1,000 mL (1,000 mLs Intravenous New Bag/Given 01/29/24 2115)    ED Course/ Medical Decision Making/ A&P                                 Medical Decision Making Amount and/or Complexity of Data Reviewed Labs: ordered.    Details: Normal WBC. ECG/medicine tests: ordered and independent interpretation performed.    Details: No ischemia   Patient presents with vomiting after  methamphetamine use.  Seems like the vomiting is over after he left the [facility].  He has been observed here for several hours with no further vomiting.  He feels fine.  He is having suicidal thoughts and will be sent back to the [facility] as per prior plan. He is medically clear at this time. Discussed with Ene Ajibola, who accepts in transfer.        Final Clinical Impression(s) / ED Diagnoses Final diagnoses:  Polysubstance abuse (HCC)    Rx / DC Orders ED Discharge Orders     None  Jerilynn Montenegro, MD 01/29/24 2238

## 2024-01-29 NOTE — ED Provider Notes (Signed)
 Patient entered lobby at [facility], stating he just ingested methamphetamines. The patient started to complain that he was not feeling well and began to vomit. The patient then stated that he was hurting all over and could not walk. The patient was told that he would be transported to the [facility] ED for medical clearance.

## 2024-01-29 NOTE — ED Notes (Signed)
 [facility] nurse called back and said they will most likely not have any beds until to morning.

## 2024-01-29 NOTE — ED Notes (Signed)
 Patient given something to drink. Patient resting.

## 2024-01-29 NOTE — ED Triage Notes (Signed)
 Patient coming in from [facility]. Patient stated to have smoked meth and cannabis sometime today and is now stating he "feels like something ain't right". Patient was experiencing delusions with EMS but unable to express of what and auditory hallucination he describes at "mumbles". Hx of bipolar 1 EMS VS 98% RA 75 HR 132 CBG 130 SBP
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
## 2024-01-29 NOTE — ED Notes (Signed)
 This nurse called to give report to [facility], nurse at [facility] stated that the beds are full. [facility] nurse was given call back number to discuss play for transfer.

## 2024-01-30 ENCOUNTER — Encounter (HOSPITAL_COMMUNITY): Payer: Self-pay | Admitting: Nurse Practitioner

## 2024-01-30 ENCOUNTER — Inpatient Hospital Stay (HOSPITAL_COMMUNITY)
Admission: AD | Admit: 2024-01-30 | Discharge: 2024-02-08 | DRG: 885 | Disposition: A | Discharge status: Home / Self Care | Payer: MEDICAID | Source: Intra-hospital | Attending: Psychiatry | Admitting: Psychiatry

## 2024-01-30 DIAGNOSIS — F319 Bipolar disorder, unspecified: Secondary | ICD-10-CM

## 2024-01-30 DIAGNOSIS — F24 Shared psychotic disorder: Secondary | ICD-10-CM | POA: Diagnosis present

## 2024-01-30 DIAGNOSIS — Z79899 Other long term (current) drug therapy: Secondary | ICD-10-CM | POA: Diagnosis not present

## 2024-01-30 DIAGNOSIS — F1290 Cannabis use, unspecified, uncomplicated: Secondary | ICD-10-CM | POA: Diagnosis present

## 2024-01-30 DIAGNOSIS — Z716 Tobacco abuse counseling: Secondary | ICD-10-CM

## 2024-01-30 DIAGNOSIS — R45851 Suicidal ideations: Secondary | ICD-10-CM | POA: Diagnosis present

## 2024-01-30 DIAGNOSIS — F19929 Other psychoactive substance use, unspecified with intoxication, unspecified: Secondary | ICD-10-CM | POA: Insufficient documentation

## 2024-01-30 DIAGNOSIS — Z604 Social exclusion and rejection: Secondary | ICD-10-CM | POA: Diagnosis present

## 2024-01-30 DIAGNOSIS — F13239 Sedative, hypnotic or anxiolytic dependence with withdrawal, unspecified: Secondary | ICD-10-CM | POA: Diagnosis present

## 2024-01-30 DIAGNOSIS — Z8614 Personal history of Methicillin resistant Staphylococcus aureus infection: Secondary | ICD-10-CM

## 2024-01-30 DIAGNOSIS — Z8249 Family history of ischemic heart disease and other diseases of the circulatory system: Secondary | ICD-10-CM

## 2024-01-30 DIAGNOSIS — Z87891 Personal history of nicotine dependence: Secondary | ICD-10-CM

## 2024-01-30 DIAGNOSIS — F1910 Other psychoactive substance abuse, uncomplicated: Secondary | ICD-10-CM | POA: Diagnosis not present

## 2024-01-30 DIAGNOSIS — Z5941 Food insecurity: Secondary | ICD-10-CM

## 2024-01-30 DIAGNOSIS — Z765 Malingerer [conscious simulation]: Secondary | ICD-10-CM

## 2024-01-30 DIAGNOSIS — Z5982 Transportation insecurity: Secondary | ICD-10-CM | POA: Diagnosis not present

## 2024-01-30 DIAGNOSIS — Z880 Allergy status to penicillin: Secondary | ICD-10-CM | POA: Diagnosis not present

## 2024-01-30 DIAGNOSIS — Z560 Unemployment, unspecified: Secondary | ICD-10-CM

## 2024-01-30 DIAGNOSIS — F1523 Other stimulant dependence with withdrawal: Secondary | ICD-10-CM | POA: Diagnosis present

## 2024-01-30 DIAGNOSIS — Z83438 Family history of other disorder of lipoprotein metabolism and other lipidemia: Secondary | ICD-10-CM | POA: Diagnosis not present

## 2024-01-30 DIAGNOSIS — I10 Essential (primary) hypertension: Secondary | ICD-10-CM | POA: Diagnosis present

## 2024-01-30 DIAGNOSIS — Z5900 Homelessness unspecified: Secondary | ICD-10-CM

## 2024-01-30 DIAGNOSIS — F3130 Bipolar disorder, current episode depressed, mild or moderate severity, unspecified: Principal | ICD-10-CM | POA: Diagnosis present

## 2024-01-30 DIAGNOSIS — E785 Hyperlipidemia, unspecified: Secondary | ICD-10-CM | POA: Diagnosis present

## 2024-01-30 HISTORY — DX: Homelessness unspecified: Z59.00

## 2024-01-30 MED ORDER — HYDROXYZINE HCL 50 MG PO TABS
50.0000 mg | ORAL_TABLET | Freq: Two times a day (BID) | ORAL | Status: DC | PRN
  Administered 2024-01-30 – 2024-02-02 (×7): 50 mg via ORAL
  Filled 2024-01-30 (×7): qty 1

## 2024-01-30 MED ORDER — DIPHENHYDRAMINE HCL 50 MG/ML IJ SOLN: 50.0000 mg | Freq: Three times a day (TID) | INTRAMUSCULAR | Status: DC | PRN

## 2024-01-30 MED ORDER — AMLODIPINE BESYLATE 5 MG PO TABS
5.0000 mg | ORAL_TABLET | Freq: Every day | ORAL | Status: DC
  Administered 2024-01-30: 5 mg via ORAL
  Filled 2024-01-30: qty 1

## 2024-01-30 MED ORDER — LORAZEPAM 2 MG/ML IJ SOLN: 2.0000 mg | Freq: Three times a day (TID) | INTRAMUSCULAR | Status: DC | PRN

## 2024-01-30 MED ORDER — HALOPERIDOL LACTATE 5 MG/ML IJ SOLN: 5.0000 mg | Freq: Three times a day (TID) | INTRAMUSCULAR | Status: DC | PRN

## 2024-01-30 MED ORDER — FOLIC ACID 1 MG PO TABS
1.0000 mg | ORAL_TABLET | Freq: Every day | ORAL | Status: DC
  Administered 2024-01-30: 1 mg via ORAL
  Filled 2024-01-30: qty 1

## 2024-01-30 MED ORDER — BUSPIRONE HCL 15 MG PO TABS
7.5000 mg | ORAL_TABLET | Freq: Two times a day (BID) | ORAL | Status: DC
  Filled 2024-01-30 (×2): qty 1

## 2024-01-30 MED ORDER — LORAZEPAM 1 MG PO TABS: 1.0000 mg | ORAL_TABLET | Freq: Four times a day (QID) | ORAL | Status: AC | PRN

## 2024-01-30 MED ORDER — VITAMIN B-1 100 MG PO TABS
100.0000 mg | ORAL_TABLET | Freq: Every day | ORAL | Status: DC
  Administered 2024-01-31 – 2024-02-02 (×3): 100 mg via ORAL
  Filled 2024-01-30 (×4): qty 1

## 2024-01-30 MED ORDER — ESCITALOPRAM OXALATE 10 MG PO TABS
10.0000 mg | ORAL_TABLET | Freq: Every day | ORAL | Status: DC
  Administered 2024-01-31 – 2024-02-08 (×9): 10 mg via ORAL
  Filled 2024-01-30 (×9): qty 1

## 2024-01-30 MED ORDER — BUSPIRONE HCL 7.5 MG PO TABS
7.5000 mg | ORAL_TABLET | Freq: Two times a day (BID) | ORAL | Status: DC
  Administered 2024-01-30: 7.5 mg via ORAL
  Filled 2024-01-30 (×5): qty 1

## 2024-01-30 MED ORDER — THIAMINE HCL 100 MG/ML IJ SOLN: 100.0000 mg | Freq: Every day | INTRAMUSCULAR | Status: DC

## 2024-01-30 MED ORDER — OLANZAPINE 5 MG PO TABS
5.0000 mg | ORAL_TABLET | Freq: Two times a day (BID) | ORAL | Status: DC
  Administered 2024-01-30: 5 mg via ORAL
  Filled 2024-01-30: qty 1

## 2024-01-30 MED ORDER — NICOTINE POLACRILEX 2 MG MT GUM
2.0000 mg | CHEWING_GUM | OROMUCOSAL | Status: DC | PRN
  Administered 2024-02-07: 2 mg via ORAL

## 2024-01-30 MED ORDER — HALOPERIDOL 5 MG PO TABS
5.0000 mg | ORAL_TABLET | Freq: Three times a day (TID) | ORAL | Status: DC | PRN
  Administered 2024-02-03 – 2024-02-05 (×3): 5 mg via ORAL
  Filled 2024-01-30 (×3): qty 1

## 2024-01-30 MED ORDER — ACETAMINOPHEN 325 MG PO TABS
650.0000 mg | ORAL_TABLET | Freq: Four times a day (QID) | ORAL | Status: DC | PRN
  Administered 2024-02-04: 650 mg via ORAL
  Filled 2024-01-30: qty 2

## 2024-01-30 MED ORDER — ENSURE ENLIVE PO LIQD
237.0000 mL | Freq: Two times a day (BID) | ORAL | Status: DC
  Administered 2024-01-31 – 2024-02-07 (×15): 237 mL via ORAL
  Filled 2024-01-30 (×20): qty 237

## 2024-01-30 MED ORDER — OLANZAPINE 5 MG PO TABS
5.0000 mg | ORAL_TABLET | Freq: Two times a day (BID) | ORAL | Status: DC
  Administered 2024-01-30 – 2024-01-31 (×2): 5 mg via ORAL
  Filled 2024-01-30 (×5): qty 1

## 2024-01-30 MED ORDER — HALOPERIDOL LACTATE 5 MG/ML IJ SOLN: 10.0000 mg | Freq: Three times a day (TID) | INTRAMUSCULAR | Status: DC | PRN

## 2024-01-30 MED ORDER — ADULT MULTIVITAMIN W/MINERALS CH
1.0000 | ORAL_TABLET | Freq: Every day | ORAL | Status: DC
  Administered 2024-01-31 – 2024-02-08 (×9): 1 via ORAL
  Filled 2024-01-30 (×10): qty 1

## 2024-01-30 MED ORDER — HYDROXYZINE HCL 50 MG PO TABS: 50.0000 mg | ORAL_TABLET | Freq: Two times a day (BID) | ORAL | Status: DC | PRN

## 2024-01-30 MED ORDER — ALUM & MAG HYDROXIDE-SIMETH 200-200-20 MG/5ML PO SUSP: 30.0000 mL | ORAL | Status: DC | PRN

## 2024-01-30 MED ORDER — AMLODIPINE BESYLATE 5 MG PO TABS
5.0000 mg | ORAL_TABLET | Freq: Every day | ORAL | Status: DC
  Administered 2024-01-31 – 2024-02-03 (×4): 5 mg via ORAL
  Filled 2024-01-30 (×6): qty 1

## 2024-01-30 MED ORDER — GABAPENTIN 100 MG PO CAPS
100.0000 mg | ORAL_CAPSULE | Freq: Two times a day (BID) | ORAL | Status: DC
  Administered 2024-01-31 – 2024-02-01 (×4): 100 mg via ORAL
  Filled 2024-01-30 (×10): qty 1

## 2024-01-30 MED ORDER — LOPERAMIDE HCL 2 MG PO CAPS: 2.0000 mg | ORAL_CAPSULE | ORAL | Status: DC | PRN

## 2024-01-30 MED ORDER — LORAZEPAM 1 MG PO TABS
1.0000 mg | ORAL_TABLET | ORAL | Status: DC | PRN
  Administered 2024-01-30: 2 mg via ORAL
  Filled 2024-01-30: qty 2

## 2024-01-30 MED ORDER — THIAMINE MONONITRATE 100 MG PO TABS
100.0000 mg | ORAL_TABLET | Freq: Every day | ORAL | Status: DC
  Administered 2024-01-30: 100 mg via ORAL
  Filled 2024-01-30: qty 1

## 2024-01-30 MED ORDER — ONDANSETRON 4 MG PO TBDP: 4.0000 mg | ORAL_TABLET | Freq: Four times a day (QID) | ORAL | Status: DC | PRN

## 2024-01-30 MED ORDER — MAGNESIUM HYDROXIDE 400 MG/5ML PO SUSP: 30.0000 mL | Freq: Every day | ORAL | Status: DC | PRN

## 2024-01-30 MED ORDER — GABAPENTIN 100 MG PO CAPS
100.0000 mg | ORAL_CAPSULE | Freq: Two times a day (BID) | ORAL | Status: DC
Start: 2024-01-30 — End: 2024-01-30
  Filled 2024-01-30: qty 1

## 2024-01-30 MED ORDER — NICOTINE POLACRILEX 2 MG MT GUM: 2.0000 mg | CHEWING_GUM | OROMUCOSAL | Status: DC | PRN

## 2024-01-30 MED ORDER — ADULT MULTIVITAMIN W/MINERALS CH
1.0000 | ORAL_TABLET | Freq: Every day | ORAL | Status: DC
  Administered 2024-01-30: 1 via ORAL
  Filled 2024-01-30: qty 1

## 2024-01-30 MED ORDER — ESCITALOPRAM OXALATE 10 MG PO TABS
10.0000 mg | ORAL_TABLET | Freq: Every day | ORAL | Status: DC
  Administered 2024-01-30: 10 mg via ORAL
  Filled 2024-01-30: qty 1

## 2024-01-30 MED ORDER — DIPHENHYDRAMINE HCL 25 MG PO CAPS
50.0000 mg | ORAL_CAPSULE | Freq: Three times a day (TID) | ORAL | Status: DC | PRN
  Administered 2024-02-03 – 2024-02-05 (×3): 50 mg via ORAL
  Filled 2024-01-30 (×3): qty 2

## 2024-01-30 NOTE — Plan of Care (Signed)
   Problem: Education: Goal: Emotional status will improve Outcome: Not Progressing Goal: Mental status will improve Outcome: Not Progressing Goal: Verbalization of understanding the information provided will improve Outcome: Not Progressing

## 2024-01-30 NOTE — ED Provider Notes (Addendum)
 Emergency Medicine Observation Re-evaluation Note  Trevor [name] is a 43 y.o. male, seen on rounds today.  Pt initially presented to the ED for complaints of Medical Clearance Currently, the patient is asleep.  Physical Exam  BP 139/84 (BP Location: Right Arm)   Pulse 75   Temp 98.3 F (36.8 C) (Oral)   Resp 18   SpO2 100%  Physical Exam General: NAD Cardiac: well perfused Lungs: even and unlabored Psych: no agitation  ED Course / MDM  EKG:EKG Interpretation Date/Time:  Sunday Jan 29 2024 22:27:28 EDT Ventricular Rate:  61 PR Interval:  110 QRS Duration:  102 QT Interval:  418 QTC Calculation: 420 R Axis:   79  Text Interpretation: Sinus rhythm with short PR Minimal voltage criteria for LVH, may be normal variant ( Sokolow-Lyon ) Confirmed by Jerilynn Montenegro [phone]) on 01/29/2024 10:32:43 PM  I have reviewed the labs performed to date as well as medications administered while in observation.  Recent changes in the last 24 hours include pt presented yesterday evening for medical clearance from [facility]. Pt medically cleared by ER provider yesterday, had presented vomiting after methamphetamine use which has resolved. No beds reportedly at [facility], will consult TTS for formal evaluation.  Plan  Current plan is for TTS consult.  Pt placed on CIWA.   Rosealee Concha, MD 01/30/24 6045    Rosealee Concha, MD 01/30/24 [phone]
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
## 2024-01-30 NOTE — Progress Notes (Addendum)
 CSW added substance abuse resources to patient's AVS.  Current plan is for patient to go to inpatient psychiatric treatment after medical clearance.  Shepard Dicker, MSW, LCSW Transitions of Care  Clinical Social Worker II [phone]

## 2024-01-30 NOTE — Tx Team (Signed)
 Initial Treatment Plan 01/30/2024 3:00 PM Sahaj Raj ZOX:[id number]    PATIENT STRESSORS: Financial difficulties   Legal issue   Marital or family conflict   Medication change or noncompliance   Substance abuse     PATIENT STRENGTHS: Motivation for treatment/growth    PATIENT IDENTIFIED PROBLEMS: "Wanting to stop doing drugs. Period."  Medication management                   DISCHARGE CRITERIA:  Ability to meet basic life and health needs Adequate post-discharge living arrangements Improved stabilization in mood, thinking, and/or behavior  PRELIMINARY DISCHARGE PLAN: Attend 12-step recovery group Outpatient therapy  PATIENT/FAMILY INVOLVEMENT: This treatment plan has been presented to and reviewed with the patient, Trevor [name].  The patient has been given the opportunity to ask questions and make suggestions.  Trevor Ridgeway C Sallee Hogrefe, RN 01/30/2024, 3:00 PM
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
## 2024-01-30 NOTE — Discharge Instructions (Signed)

## 2024-01-30 NOTE — [facility] Group Notes (Signed)
 Psychoeducational Group Note  Date:  01/30/2024 Time:  2000  Group Topic/Focus:  Alcoholics Anonymous Meeting  Participation Level: Did Not Attend  Participation Quality:  Not Applicable  Affect:  Not Applicable  Cognitive:  Not Applicable  Insight:  Not Applicable  Engagement in Group: Not Applicable  Additional Comments:  Did not attend.   Catharine Clock 01/30/2024, 9:49 PM

## 2024-01-31 DIAGNOSIS — F319 Bipolar disorder, unspecified: Secondary | ICD-10-CM | POA: Diagnosis not present

## 2024-01-31 MED ORDER — CLONAZEPAM 0.5 MG PO TABS
0.5000 mg | ORAL_TABLET | Freq: Two times a day (BID) | ORAL | Status: DC | PRN
  Administered 2024-01-31 – 2024-02-01 (×3): 0.5 mg via ORAL
  Filled 2024-01-31 (×3): qty 1

## 2024-01-31 MED ORDER — OLANZAPINE 10 MG PO TABS
10.0000 mg | ORAL_TABLET | Freq: Every day | ORAL | Status: DC
  Administered 2024-01-31 – 2024-02-07 (×8): 10 mg via ORAL
  Filled 2024-01-31 (×8): qty 1

## 2024-01-31 NOTE — Plan of Care (Signed)
°  Problem: Education: °Goal: Emotional status will improve °Outcome: Progressing °  °Problem: Activity: °Goal: Sleeping patterns will improve °Outcome: Progressing °  °Problem: Safety: °Goal: Periods of time without injury will increase °Outcome: Progressing °  °

## 2024-01-31 NOTE — Progress Notes (Signed)
 Pt refused Gabapentin  tonight because he said it makes his stomach hurt    01/31/24 0015  Psych Admission Type (Psych Patients Only)  Admission Status Voluntary  Psychosocial Assessment  Patient Complaints Anxiety  Eye Contact Fair  Facial Expression Flat  Affect Anxious  Speech Slow  Interaction Assertive  Motor Activity Fidgety  Appearance/Hygiene Disheveled  Behavior Characteristics Cooperative;Anxious  Mood Anxious  Aggressive Behavior  Effect No apparent injury  Thought Process  Coherency Circumstantial  Content Blaming others;Paranoia  Delusions Paranoid  Perception Hallucinations  Hallucination Auditory;Visual  Judgment Impaired  Confusion WDL  Danger to Self  Current suicidal ideation? Active  Self-Injurious Behavior Some self-injurious ideation observed or expressed.  No lethal plan expressed   Agreement Not to Harm Self Yes  Description of Agreement Verbal contract for safety

## 2024-01-31 NOTE — Plan of Care (Signed)
  Problem: Education: Goal: Knowledge of [place] [facility] Education information/materials will improve Outcome: Progressing Goal: Emotional status will improve Outcome: Progressing Goal: Mental status will improve Outcome: Progressing Goal: Verbalization of understanding the information provided will improve Outcome: Progressing   Problem: Activity: Goal: Interest or engagement in activities will improve Outcome: Progressing Goal: Sleeping patterns will improve Outcome: Progressing   Problem: Coping: Goal: Ability to verbalize frustrations and anger appropriately will improve Outcome: Progressing Goal: Ability to demonstrate self-control will improve Outcome: Progressing   Problem: Health Behavior/Discharge Planning: Goal: Identification of resources available to assist in meeting health care needs will improve Outcome: Progressing Goal: Compliance with treatment plan for underlying cause of condition will improve Outcome: Progressing   Problem: Physical Regulation: Goal: Ability to maintain clinical measurements within normal limits will improve Outcome: Progressing   Problem: Safety: Goal: Periods of time without injury will increase Outcome: Progressing   Problem: Activity: Goal: Will verbalize the importance of balancing activity with adequate rest periods Outcome: Progressing   Problem: Education: Goal: Will be free of psychotic symptoms Outcome: Progressing Goal: Knowledge of the prescribed therapeutic regimen will improve Outcome: Progressing   Problem: Coping: Goal: Coping ability will improve Outcome: Progressing Goal: Will verbalize feelings Outcome: Progressing   Problem: Health Behavior/Discharge Planning: Goal: Compliance with prescribed medication regimen will improve Outcome: Progressing   Problem: Nutritional: Goal: Ability to achieve adequate nutritional intake will improve Outcome: Progressing   Problem: Role Relationship: Goal:  Ability to communicate needs accurately will improve Outcome: Progressing Goal: Ability to interact with others will improve Outcome: Progressing   Problem: Safety: Goal: Ability to redirect hostility and anger into socially appropriate behaviors will improve Outcome: Progressing Goal: Ability to remain free from injury will improve Outcome: Progressing   Problem: Self-Care: Goal: Ability to participate in self-care as condition permits will improve Outcome: Progressing   Problem: Self-Concept: Goal: Will verbalize positive feelings about self Outcome: Progressing   Problem: Education: Goal: Knowledge of disease or condition will improve Outcome: Progressing Goal: Understanding of discharge needs will improve Outcome: Progressing   Problem: Health Behavior/Discharge Planning: Goal: Ability to identify changes in lifestyle to reduce recurrence of condition will improve Outcome: Progressing Goal: Identification of resources available to assist in meeting health care needs will improve Outcome: Progressing   Problem: Physical Regulation: Goal: Complications related to the disease process, condition or treatment will be avoided or minimized Outcome: Progressing   Problem: Safety: Goal: Ability to remain free from injury will improve Outcome: Progressing   

## 2024-01-31 NOTE — Progress Notes (Signed)
   01/31/24 2145  Psych Admission Type (Psych Patients Only)  Admission Status Voluntary  Psychosocial Assessment  Patient Complaints Isolation;Anxiety  Eye Contact Fair  Facial Expression Flat  Affect Anxious  Speech Slow  Interaction Assertive  Motor Activity Fidgety  Appearance/Hygiene Disheveled  Behavior Characteristics Cooperative  Mood Anxious  Aggressive Behavior  Effect No apparent injury  Thought Process  Coherency Circumstantial  Content Blaming others;Paranoia  Delusions Paranoid  Perception Hallucinations  Hallucination Auditory;Visual  Judgment Impaired  Confusion WDL  Danger to Self  Current suicidal ideation? Active  Self-Injurious Behavior Some self-injurious ideation observed or expressed.  No lethal plan expressed   Agreement Not to Harm Self Yes  Description of Agreement Verbal contract for safety  Danger to Others  Danger to Others None reported or observed

## 2024-01-31 NOTE — [facility] Group Notes (Signed)
 Psychoeducational Group Note  Date:  01/31/2024 Time:  2000  Group Topic/Focus:  Wrap up group  Participation Level: Did Not Attend  Participation Quality:  Not Applicable  Affect:  Not Applicable  Cognitive:  Not Applicable  Insight:  Not Applicable  Engagement in Group: Not Applicable  Additional Comments:  Did not attend.   Catharine Clock 01/31/2024, 9:55 PM
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
## 2024-01-31 NOTE — Plan of Care (Signed)
  Problem: Education: Goal: Emotional status will improve Outcome: Progressing Goal: Mental status will improve Outcome: Progressing   Problem: Activity: Goal: Sleeping patterns will improve Outcome: Progressing   Problem: Safety: Goal: Periods of time without injury will increase Outcome: Progressing   Problem: Activity: Goal: Interest or engagement in activities will improve Outcome: Not Progressing
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
## 2024-01-31 NOTE — Group Note (Signed)
 Date:  01/31/2024 Time:  8:38 AM  Group Topic/Focus:  Goals Group:   The focus of this group is to help patients establish daily goals to achieve during treatment and discuss how the patient can incorporate goal setting into their daily lives to aide in recovery.    Participation Level:  Did Not Attend   Trevor [name] 01/31/2024, 8:38 AM

## 2024-01-31 NOTE — Group Note (Signed)
 LCSW Group Therapy Note   Group Date: 01/31/2024 Start Time: 1100 End Time: 1200   Participation:  did not attend  Type of Therapy:  Group Therapy  Topic:  Stress Less:  Nurturing Your Mind and Body Through Calm    Objective:  Learn techniques for managing stress through body relaxation, mindfulness, and self-compassion. Goals: Use body relaxation techniques, such as Box Breathing and Progressive Muscle Relaxation, to reduce physical tension. Practice mindfulness to break the cycle of overthinking and mental chatter. Embrace self-compassion to handle stress with kindness and resilience.  Summary:  Today's session focused on calming the body with relaxation techniques, breaking the cycle of stress with mindfulness, and using self-compassion to manage challenges more gracefully. These tools help reduce stress and foster a balanced, peaceful mindset.  Therapeutic Modalities used:  Elements of CBT ( cognitive restructuring)  Elements of DBT (box breathing, progressive body relaxation, mindfulness, acceptance)    Deyani Hegarty O Avaneesh Pepitone, LCSWA 01/31/2024  12:16 PM

## 2024-01-31 NOTE — Group Note (Signed)
 Recreation Therapy Group Note   Group Topic:Animal Assisted Therapy   Group Date: 01/31/2024 Start Time: 0945 End Time: 1030 Facilitators: Trevor [name], LRT,CTRS Location: [address] Dayroom   Animal-Assisted Activity (AAA) Program Checklist/Progress Notes Patient Eligibility Criteria Checklist & Daily Group note for Rec Tx Intervention  AAA/T Program Assumption of Risk Form signed by Patient/ or Parent Legal Guardian Yes  Patient is free of allergies or severe asthma Yes  Patient reports no fear of animals Yes  Patient reports no history of cruelty to animals Yes  Patient understands his/her participation is voluntary Yes  Patient washes hands before animal contact Yes  Patient washes hands after animal contact Yes  Education: Hand Washing, Appropriate Animal Interaction   Education Outcome: Acknowledges education.    Affect/Mood: N/A   Participation Level: Did not attend    Clinical Observations/Individualized Feedback:     Plan: Continue to engage patient in RT group sessions 2-3x/week.   Trevor [name], LRT,CTRS 01/31/2024 1:02 PM

## 2024-01-31 NOTE — Progress Notes (Signed)
 Pt refused buspar  this morning noting " It makes me feel manic". Pt did endorse anxiety. Buspar  administered prn with good effect. Pt endorses passive SI and auditory hallucinations. Support and encouragement provided.

## 2024-02-01 ENCOUNTER — Encounter (HOSPITAL_COMMUNITY): Payer: Self-pay

## 2024-02-01 DIAGNOSIS — F319 Bipolar disorder, unspecified: Secondary | ICD-10-CM | POA: Diagnosis not present

## 2024-02-01 MED ORDER — GABAPENTIN 300 MG PO CAPS
300.0000 mg | ORAL_CAPSULE | Freq: Two times a day (BID) | ORAL | Status: DC
  Administered 2024-02-02 – 2024-02-08 (×13): 300 mg via ORAL
  Filled 2024-02-01 (×13): qty 1

## 2024-02-01 MED ORDER — NICOTINE 14 MG/24HR TD PT24
14.0000 mg | MEDICATED_PATCH | Freq: Every day | TRANSDERMAL | Status: DC
  Administered 2024-02-01 – 2024-02-08 (×8): 14 mg via TRANSDERMAL
  Filled 2024-02-01 (×8): qty 1

## 2024-02-01 NOTE — Group Note (Signed)
 Date:  02/01/2024 Time:  2:00 PM  Group Topic/Focus:  Emotional Education:   The focus of this group is to discuss unhealthy thought patterns and how they impact mental health.     Participation Level:  Did Not Attend   Trevor [name] 02/01/2024, 2:00 PM
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
## 2024-02-01 NOTE — Progress Notes (Signed)
  Trevor [name]   Type of Note: Substance use treatment  Patient unsure if interested in residential substance use treatment at discharge.   Does not want to go to either [facility] or [facility] in [place].   Pt was given list of res. substance use facilities and was encouraged to begin calling to complete phone screens.   Pt agreeable. Will continue to assist as needed.   Signed:  Edd Reppert, LCSW-A 02/01/2024  2:35 PM

## 2024-02-01 NOTE — Plan of Care (Signed)
  Problem: Education: Goal: Emotional status will improve Outcome: Progressing Goal: Verbalization of understanding the information provided will improve Outcome: Progressing   Problem: Activity: Goal: Interest or engagement in activities will improve Outcome: Progressing Goal: Sleeping patterns will improve Outcome: Progressing

## 2024-02-01 NOTE — [facility] Suicide Risk Assessment (Signed)
 [facility] INPATIENT:  Family/Significant Other Suicide Prevention Education  Suicide Prevention Education:  Patient Refusal for Family/Significant Other Suicide Prevention Education: The patient Trevor [name] has refused to provide written consent for family/significant other to be provided Family/Significant Other Suicide Prevention Education during admission and/or prior to discharge.  Physician notified.  Vonzell Guerin 02/01/2024, 2:32 PM

## 2024-02-01 NOTE — Progress Notes (Signed)
   02/01/24 2245  Psych Admission Type (Psych Patients Only)  Admission Status Voluntary  Psychosocial Assessment  Patient Complaints Anxiety;Depression  Eye Contact Fair  Facial Expression Flat  Affect Anxious  Speech Slow  Interaction Assertive  Motor Activity Fidgety  Appearance/Hygiene Disheveled  Behavior Characteristics Cooperative;Calm  Mood Anxious  Aggressive Behavior  Effect No apparent injury  Thought Process  Coherency Circumstantial  Content Blaming others;Paranoia  Delusions Paranoid  Perception Hallucinations  Hallucination Auditory;Visual  Judgment Impaired  Confusion WDL  Danger to Self  Current suicidal ideation? Active  Self-Injurious Behavior Some self-injurious ideation observed or expressed.  No lethal plan expressed   Agreement Not to Harm Self Yes  Description of Agreement Verbal contract for safety  Danger to Others  Danger to Others None reported or observed

## 2024-02-01 NOTE — Group Note (Signed)
 Recreation Therapy Group Note   Group Topic:Communication  Group Date: 02/01/2024 Start Time: 0933 End Time: 1013 Facilitators: Oveta Idris-McCall, LRT,CTRS Location: [address] Dayroom   Group Topic: Communication, Problem Solving   Goal Area(s) Addresses:  Patient will effectively listen to complete activity.  Patient will identify communication skills used to make activity successful.  Patient will identify how skills used during activity can be used to reach post d/c goals.    Behavioral Response: N/A   Intervention: Building surveyor Activity - Geometric pattern cards, pencils, blank paper    Activity: Geometric Drawings.  Three volunteers from the peer group will be shown an abstract picture with a particular arrangement of geometrical shapes.  Each round, one 'speaker' will describe the pattern, as accurately as possible without revealing the image to the group.  The remaining group members will listen and draw the picture to reflect how it is described to them. Patients with the role of 'listener' cannot ask clarifying questions but, may request that the speaker repeat a direction. Once the drawings are complete, the presenter will show the rest of the group the picture and compare how close each person came to drawing the picture. LRT will facilitate a post-activity discussion regarding effective communication and the importance of planning, listening, and asking for clarification in daily interactions with others.  Education: Environmental consultant, Active listening, Support systems, Discharge planning  Education Outcome: Acknowledges understanding/In group clarification offered/Needs additional education.    Affect/Mood: N/A   Participation Level: Did not attend    Clinical Observations/Individualized Feedback:     Plan: Continue to engage patient in RT group sessions 2-3x/week.   Amyrah Pinkhasov-McCall, LRT,CTRS 02/01/2024 1:14 PM

## 2024-02-01 NOTE — Progress Notes (Signed)
   02/01/24 1000  Psych Admission Type (Psych Patients Only)  Admission Status Voluntary  Psychosocial Assessment  Patient Complaints Anxiety;Depression  Eye Contact Fair  Facial Expression Flat  Affect Anxious;Depressed  Speech Slow  Interaction Assertive  Motor Activity Fidgety  Appearance/Hygiene Disheveled  Behavior Characteristics Cooperative;Calm  Mood Anxious  Thought Process  Coherency Circumstantial  Content Blaming others  Delusions Paranoid  Perception Hallucinations  Hallucination Auditory  Judgment Impaired  Confusion None  Danger to Self  Current suicidal ideation? Denies  Self-Injurious Behavior Some self-injurious ideation observed or expressed.  No lethal plan expressed   Agreement Not to Harm Self Yes  Description of Agreement verbal  Danger to Others  Danger to Others None reported or observed

## 2024-02-01 NOTE — Group Note (Signed)
 Date:  02/01/2024 Time:  1:47 PM  Group Topic/Focus:  Goals Group:   The focus of this group is to help patients establish daily goals to achieve during treatment and discuss how the patient can incorporate goal setting into their daily lives to aide in recovery. Orientation:   The focus of this group is to educate the patient on the purpose and policies of crisis stabilization and provide a format to answer questions about their admission.  The group details unit policies and expectations of patients while admitted.    Participation Level:  Did Not Attend    Trevor [name] 02/01/2024, 1:47 PM

## 2024-02-01 NOTE — BH IP Treatment Plan (Signed)
 Interdisciplinary Treatment and Diagnostic Plan Update  02/01/2024 Time of Session: 1110AM Trevor [name] MRN: [id number]  Principal Diagnosis: Bipolar 1 disorder, depressed (HCC)  Secondary Diagnoses: Principal Problem:   Bipolar 1 disorder, depressed (HCC)   Current Medications:  Current Facility-Administered Medications  Medication Dose Route Frequency Provider Last Rate Last Admin   acetaminophen  (TYLENOL ) tablet 650 mg  650 mg Oral Q6H PRN [name], NP       alum & mag hydroxide-simeth (MAALOX/MYLANTA) 200-200-20 MG/5ML suspension 30 mL  30 mL Oral Q4H PRN [name], NP       amLODipine  (NORVASC ) tablet 5 mg  5 mg Oral Daily [name], NP   5 mg at 02/01/24 1022   haloperidol  (HALDOL ) tablet 5 mg  5 mg Oral TID PRN [name], NP       And   diphenhydrAMINE  (BENADRYL ) capsule 50 mg  50 mg Oral TID PRN [name], NP       haloperidol  lactate (HALDOL ) injection 5 mg  5 mg Intramuscular TID PRN [name], NP       And   diphenhydrAMINE  (BENADRYL ) injection 50 mg  50 mg Intramuscular TID PRN [name], NP       And   LORazepam  (ATIVAN ) injection 2 mg  2 mg Intramuscular TID PRN [name], NP       haloperidol  lactate (HALDOL ) injection 10 mg  10 mg Intramuscular TID PRN [name], NP       And   diphenhydrAMINE  (BENADRYL ) injection 50 mg  50 mg Intramuscular TID PRN [name], NP       And   LORazepam  (ATIVAN ) injection 2 mg  2 mg Intramuscular TID PRN [name], NP       escitalopram  (LEXAPRO ) tablet 10 mg  10 mg Oral Daily [name], NP   10 mg at 02/01/24 0759   feeding supplement (ENSURE ENLIVE / ENSURE PLUS) liquid 237 mL  237 mL Oral BID BM Zouev, Dmitri, MD   237 mL at 02/01/24 1022   gabapentin  (NEURONTIN ) capsule 100 mg  100 mg Oral BID [name], NP   100 mg at 02/01/24 0759   hydrOXYzine  (ATARAX ) tablet 50 mg  50 mg Oral BID PRN [name], NP   50 mg at 01/31/24 2148    loperamide (IMODIUM) capsule 2-4 mg  2-4 mg Oral PRN [name], NP       LORazepam  (ATIVAN ) tablet 1 mg  1 mg Oral Q6H PRN [name], NP       magnesium hydroxide (MILK OF MAGNESIA) suspension 30 mL  30 mL Oral Daily PRN [name], NP       multivitamin with minerals tablet 1 tablet  1 tablet Oral Daily [name], NP   1 tablet at 02/01/24 0759   nicotine  (NICODERM CQ  - dosed in mg/24 hours) patch 14 mg  14 mg Transdermal Daily Attiah, Nadir, MD   14 mg at 02/01/24 1023   nicotine  polacrilex (NICORETTE ) gum 2 mg  2 mg Oral PRN [name], NP       OLANZapine  (ZYPREXA ) tablet 10 mg  10 mg Oral QHS Paliy, Alisa, MD   10 mg at 01/31/24 2148   ondansetron  (ZOFRAN -ODT) disintegrating tablet 4 mg  4 mg Oral Q6H PRN [name], NP       thiamine (Vitamin B-1) tablet 100 mg  100 mg Oral Daily [name], NP   100 mg at 02/01/24 0759   PTA Medications: Medications Prior  to Admission  Medication Sig Dispense Refill Last Dose/Taking   ALPRAZolam  (XANAX ) 0.5 MG tablet Take 1 tablet (0.5 mg total) by mouth 2 (two) times daily as needed for anxiety (2nd line). (Patient not taking: Reported on 01/13/2024) 30 tablet 0    amLODipine  (NORVASC ) 5 MG tablet Take 1 tablet (5 mg total) by mouth daily. (Patient not taking: Reported on 01/13/2024) 30 tablet 0    busPIRone  (BUSPAR ) 7.5 MG tablet Take 1 tablet (7.5 mg total) by mouth 3 (three) times daily. (Patient not taking: Reported on 01/13/2024) 90 tablet 1    cefadroxil  (DURICEF) 500 MG capsule Take 1 capsule (500 mg total) by mouth 2 (two) times daily. (Patient not taking: Reported on 01/13/2024) 60 capsule 0    escitalopram  (LEXAPRO ) 10 MG tablet Take 1 tablet (10 mg total) by mouth daily. (Patient not taking: Reported on 01/13/2024) 30 tablet 1    gabapentin  (NEURONTIN ) 100 MG capsule Take 1 capsule (100 mg total) by mouth 2 (two) times daily. (Patient not taking: Reported on 01/13/2024) 60 capsule 0    hydrOXYzine  (ATARAX )  50 MG tablet Take 1 tablet (50 mg total) by mouth 2 (two) times daily as needed for anxiety. (Patient not taking: Reported on 01/13/2024) 60 tablet 0    ibuprofen  (ADVIL ) 800 MG tablet Take 800 mg by mouth every 8 (eight) hours as needed for moderate pain (pain score 4-6). (Patient not taking: Reported on 01/13/2024)      nicotine  polacrilex (NICORETTE ) 2 MG gum Take 1 each (2 mg total) by mouth as needed for smoking cessation. (Patient not taking: Reported on 01/13/2024) 100 tablet 0    OLANZapine  (ZYPREXA ) 5 MG tablet Take 1 tablet (5 mg total) by mouth 2 (two) times daily. (Patient not taking: Reported on 01/13/2024) 60 tablet 0     Patient Stressors: Neurosurgeon issue   Marital or family conflict   Medication change or noncompliance   Substance abuse    Patient Strengths: Motivation for treatment/growth   Treatment Modalities: Medication Management, Group therapy, Case management,  1 to 1 session with clinician, Psychoeducation, Recreational therapy.   Physician Treatment Plan for Primary Diagnosis: Bipolar 1 disorder, depressed (HCC) Long Term Goal(s): Improvement in symptoms so as ready for discharge   Short Term Goals: Ability to identify changes in lifestyle to reduce recurrence of condition will improve Ability to verbalize feelings will improve Ability to disclose and discuss suicidal ideas Ability to demonstrate self-control will improve Ability to identify and develop effective coping behaviors will improve Ability to maintain clinical measurements within normal limits will improve Compliance with prescribed medications will improve Ability to identify triggers associated with substance abuse/mental health issues will improve  Medication Management: Evaluate patient's response, side effects, and tolerance of medication regimen.  Therapeutic Interventions: 1 to 1 sessions, Unit Group sessions and Medication administration.  Evaluation of Outcomes: Not  Progressing  Physician Treatment Plan for Secondary Diagnosis: Principal Problem:   Bipolar 1 disorder, depressed (HCC)  Long Term Goal(s): Improvement in symptoms so as ready for discharge   Short Term Goals: Ability to identify changes in lifestyle to reduce recurrence of condition will improve Ability to verbalize feelings will improve Ability to disclose and discuss suicidal ideas Ability to demonstrate self-control will improve Ability to identify and develop effective coping behaviors will improve Ability to maintain clinical measurements within normal limits will improve Compliance with prescribed medications will improve Ability to identify triggers associated with substance abuse/mental health issues will improve  Medication Management: Evaluate patient's response, side effects, and tolerance of medication regimen.  Therapeutic Interventions: 1 to 1 sessions, Unit Group sessions and Medication administration.  Evaluation of Outcomes: Not Progressing   RN Treatment Plan for Primary Diagnosis: Bipolar 1 disorder, depressed (HCC) Long Term Goal(s): Knowledge of disease and therapeutic regimen to maintain health will improve  Short Term Goals: Ability to remain free from injury will improve, Ability to verbalize frustration and anger appropriately will improve, Ability to demonstrate self-control, Ability to participate in decision making will improve, Ability to verbalize feelings will improve, Ability to disclose and discuss suicidal ideas, Ability to identify and develop effective coping behaviors will improve, and Compliance with prescribed medications will improve  Medication Management: RN will administer medications as ordered by provider, will assess and evaluate patient's response and provide education to patient for prescribed medication. RN will report any adverse and/or side effects to prescribing provider.  Therapeutic Interventions: 1 on 1 counseling sessions,  Psychoeducation, Medication administration, Evaluate responses to treatment, Monitor vital signs and CBGs as ordered, Perform/monitor CIWA, COWS, AIMS and Fall Risk screenings as ordered, Perform wound care treatments as ordered.  Evaluation of Outcomes: Not Progressing   LCSW Treatment Plan for Primary Diagnosis: Bipolar 1 disorder, depressed (HCC) Long Term Goal(s): Safe transition to appropriate next level of care at discharge, Engage patient in therapeutic group addressing interpersonal concerns.  Short Term Goals: Engage patient in aftercare planning with referrals and resources, Increase social support, Increase ability to appropriately verbalize feelings, Increase emotional regulation, Facilitate acceptance of mental health diagnosis and concerns, Facilitate patient progression through stages of change regarding substance use diagnoses and concerns, Identify triggers associated with mental health/substance abuse issues, and Increase skills for wellness and recovery  Therapeutic Interventions: Assess for all discharge needs, 1 to 1 time with Social worker, Explore available resources and support systems, Assess for adequacy in community support network, Educate family and significant other(s) on suicide prevention, Complete Psychosocial Assessment, Interpersonal group therapy.  Evaluation of Outcomes: Not Progressing   Progress in Treatment: Attending groups: No. Participating in groups: No. Taking medication as prescribed: Yes. Toleration medication: Yes. Family/Significant other contact made: No, will contact:  DECLINED CONSENTS Patient understands diagnosis: Yes. Discussing patient identified problems/goals with staff: Yes. Medical problems stabilized or resolved: Yes. Denies suicidal/homicidal ideation: Yes. Issues/concerns per patient self-inventory: No.  New problem(s) identified: No, Describe:  none  New Short Term/Long Term Goal(s): detox, medication management for mood  stabilization; elimination of SI thoughts; development of comprehensive mental wellness/sobriety plan   Patient Goals:  "Get rest and find a place to stay. You can send out residential referrals for me"  Discharge Plan or Barriers: Patient recently admitted. CSW will continue to follow and assess for appropriate referrals and possible discharge planning.    Reason for Continuation of Hospitalization: Anxiety Depression Medication stabilization Suicidal ideation  Estimated Length of Stay: 5-7 days  Last 3 [place] Suicide Severity Risk Score: Flowsheet Row Ad[facility] (Current[facility] Most recent reading at 01/30/2024  2:08 PM ED from 01/29/2024 in [facility] Department [facility] Most recent reading at 01/29/2024  7:39 PM ED from 01/29/2024 in [facility] Most recent reading at 01/29/2024  6:26 PM  C-SSRS RISK CATEGORY High Risk No Risk Error: Q3, 4, or 5 should not be populated when Q2 is No       Last PHQ 2/9 Scores:     No data to  display          Scribe for Treatment Team: Vonzell Guerin, LCSWA 02/01/2024 1:20 PM

## 2024-02-02 DIAGNOSIS — F319 Bipolar disorder, unspecified: Secondary | ICD-10-CM | POA: Diagnosis not present

## 2024-02-02 MED ORDER — ONDANSETRON 4 MG PO TBDP: 4.0000 mg | ORAL_TABLET | Freq: Four times a day (QID) | ORAL | Status: AC | PRN

## 2024-02-02 MED ORDER — VITAMIN B-1 100 MG PO TABS
100.0000 mg | ORAL_TABLET | Freq: Every day | ORAL | Status: DC
  Administered 2024-02-03 – 2024-02-08 (×6): 100 mg via ORAL
  Filled 2024-02-02 (×7): qty 1

## 2024-02-02 MED ORDER — LOPERAMIDE HCL 2 MG PO CAPS: 2.0000 mg | ORAL_CAPSULE | ORAL | Status: AC | PRN

## 2024-02-02 MED ORDER — AMLODIPINE BESYLATE 2.5 MG PO TABS
2.5000 mg | ORAL_TABLET | Freq: Once | ORAL | Status: AC
  Administered 2024-02-02: 2.5 mg via ORAL
  Filled 2024-02-02: qty 1

## 2024-02-02 MED ORDER — HYDROXYZINE HCL 25 MG PO TABS
25.0000 mg | ORAL_TABLET | Freq: Four times a day (QID) | ORAL | Status: AC | PRN
  Administered 2024-02-04 – 2024-02-05 (×2): 25 mg via ORAL
  Filled 2024-02-02 (×2): qty 1

## 2024-02-02 MED ORDER — LORAZEPAM 1 MG PO TABS
1.0000 mg | ORAL_TABLET | Freq: Four times a day (QID) | ORAL | Status: AC
  Administered 2024-02-02 – 2024-02-03 (×4): 1 mg via ORAL
  Filled 2024-02-02 (×4): qty 1

## 2024-02-02 MED ORDER — LORAZEPAM 1 MG PO TABS: 1.0000 mg | ORAL_TABLET | Freq: Every day | ORAL | Status: DC

## 2024-02-02 MED ORDER — THIAMINE HCL 100 MG/ML IJ SOLN: 100.0000 mg | Freq: Once | INTRAMUSCULAR | Status: DC

## 2024-02-02 MED ORDER — LORAZEPAM 1 MG PO TABS: 1.0000 mg | ORAL_TABLET | Freq: Three times a day (TID) | ORAL | Status: DC

## 2024-02-02 MED ORDER — LORAZEPAM 1 MG PO TABS: 1.0000 mg | ORAL_TABLET | Freq: Four times a day (QID) | ORAL | Status: DC | PRN

## 2024-02-02 MED ORDER — ADULT MULTIVITAMIN W/MINERALS CH
1.0000 | ORAL_TABLET | Freq: Every day | ORAL | Status: DC
  Filled 2024-02-02 (×3): qty 1

## 2024-02-02 MED ORDER — LORAZEPAM 1 MG PO TABS: 1.0000 mg | ORAL_TABLET | Freq: Two times a day (BID) | ORAL | Status: DC

## 2024-02-02 NOTE — Plan of Care (Signed)
  Problem: Education: Goal: Emotional status will improve Outcome: Progressing Goal: Verbalization of understanding the information provided will improve Outcome: Progressing   Problem: Activity: Goal: Interest or engagement in activities will improve Outcome: Progressing   Problem: Health Behavior/Discharge Planning: Goal: Identification of resources available to assist in meeting health care needs will improve Outcome: Progressing

## 2024-02-02 NOTE — Progress Notes (Signed)
   02/02/24 2200  Psych Admission Type (Psych Patients Only)  Admission Status Voluntary  Psychosocial Assessment  Patient Complaints Substance abuse  Eye Contact Fair  Facial Expression Flat  Affect Anxious  Speech Slow  Interaction Assertive  Motor Activity Fidgety  Appearance/Hygiene Disheveled  Behavior Characteristics Cooperative  Mood Anxious  Aggressive Behavior  Effect No apparent injury  Thought Process  Coherency Circumstantial  Content Blaming others;Paranoia  Delusions Paranoid  Perception Hallucinations  Hallucination Auditory;Visual  Judgment Impaired  Confusion WDL  Danger to Self  Current suicidal ideation? Active  Self-Injurious Behavior Some self-injurious ideation observed or expressed.  No lethal plan expressed   Agreement Not to Harm Self Yes  Description of Agreement Verbal contract for safety  Danger to Others  Danger to Others None reported or observed

## 2024-02-02 NOTE — Progress Notes (Signed)
   02/02/24 0900  Psych Admission Type (Psych Patients Only)  Admission Status Voluntary  Psychosocial Assessment  Patient Complaints Other (Comment);Substance abuse ("withdrawal")  Eye Contact Fair  Facial Expression Flat  Affect Anxious;Irritable  Speech Slow  Interaction Assertive  Motor Activity Fidgety  Appearance/Hygiene Disheveled  Behavior Characteristics Cooperative;Calm  Mood Anxious;Irritable  Thought Process  Coherency Circumstantial  Content Blaming others;Paranoia  Delusions Paranoid  Perception WDL  Hallucination Auditory;Visual  Judgment Impaired  Confusion None  Danger to Self  Current suicidal ideation? Denies  Agreement Not to Harm Self Yes  Description of Agreement verbal  Danger to Others  Danger to Others None reported or observed

## 2024-02-02 NOTE — Plan of Care (Signed)
   Problem: Education: Goal: Emotional status will improve Outcome: Progressing Goal: Mental status will improve Outcome: Progressing   Problem: Activity: Goal: Sleeping patterns will improve Outcome: Progressing
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
## 2024-02-02 NOTE — Group Note (Signed)
 LCSW Group Therapy Note   Group Date: 02/02/2024 Start Time: 1100 End Time: 1200  Participation:  did not attend    Type of Therapy:  Group Therapy  Topic:  Understanding your Path to Change   Objective:  The goal is to help individuals understand the stages of change, identify where they currently are in the process, and provide actionable next steps to continue moving forward in their journey of change.  Goals: - Learn about the six stages of change:  Precontemplation, Contemplation, Preparation, Action, Maintenance, and Relapse - Reflect on Current Change Efforts:  Recognize which stage participants are in regarding a personal change. -  Plan Next Steps for Moving Forward:  Create an action plan based on their current stage of change.  Class Summary:  In this session, we explored the Stages of Change as a framework to understand the process of change.  We discussed how each stage helps individuals recognize where they are in their personal journey and used the Stages of Change Worksheet for self-reflection. Participants answered questions to better understand their current stage, challenges, and progress. We also emphasized the importance of moving forward, even if setbacks (Relapse) occur, and created actionable steps to help participants continue progressing. By the end of the session, participants gained a clearer understanding of their path to change and left with a clear plan for next steps.  Modalities:  Elements of CBT (cognitive restructuring, problem solving)  Element of DBT (mindfulness, distress tolerance)   Trevor [name] O Sundai Probert, LCSWA 02/02/2024  3:46 PM

## 2024-02-02 NOTE — Plan of Care (Signed)
   Problem: Education: Goal: Emotional status will improve Outcome: Progressing Goal: Mental status will improve Outcome: Progressing

## 2024-02-03 DIAGNOSIS — F319 Bipolar disorder, unspecified: Secondary | ICD-10-CM | POA: Diagnosis not present

## 2024-02-03 MED ORDER — CHLORDIAZEPOXIDE HCL 25 MG PO CAPS
25.0000 mg | ORAL_CAPSULE | Freq: Three times a day (TID) | ORAL | Status: AC | PRN
  Administered 2024-02-03 – 2024-02-05 (×5): 25 mg via ORAL
  Filled 2024-02-03 (×5): qty 1

## 2024-02-03 MED ORDER — AMLODIPINE BESYLATE 10 MG PO TABS
10.0000 mg | ORAL_TABLET | Freq: Every day | ORAL | Status: DC
  Administered 2024-02-04 – 2024-02-08 (×5): 10 mg via ORAL
  Filled 2024-02-03 (×5): qty 1

## 2024-02-03 NOTE — Plan of Care (Signed)
  Problem: Coping: Goal: Ability to verbalize frustrations and anger appropriately will improve Outcome: Progressing   Problem: Activity: Goal: Interest or engagement in activities will improve Outcome: Progressing   Problem: Physical Regulation: Goal: Ability to maintain clinical measurements within normal limits will improve Outcome: Progressing

## 2024-02-03 NOTE — Progress Notes (Signed)
   02/03/24 2130  Psych Admission Type (Psych Patients Only)  Admission Status Voluntary  Psychosocial Assessment  Patient Complaints Substance abuse  Eye Contact Fair  Facial Expression Flat  Affect Anxious  Speech Slow  Interaction Assertive  Motor Activity Fidgety  Appearance/Hygiene Disheveled  Behavior Characteristics Cooperative  Mood Anxious  Aggressive Behavior  Effect No apparent injury  Thought Process  Coherency Circumstantial  Content Blaming others;Paranoia  Delusions Paranoid  Perception Hallucinations  Hallucination Auditory;Visual  Judgment Impaired  Confusion WDL  Danger to Self  Current suicidal ideation? Active  Self-Injurious Behavior Some self-injurious ideation observed or expressed.  No lethal plan expressed   Agreement Not to Harm Self Yes  Description of Agreement Verbal contract for safety  Danger to Others  Danger to Others None reported or observed

## 2024-02-03 NOTE — Plan of Care (Signed)
   Problem: Education: Goal: Emotional status will improve Outcome: Progressing Goal: Mental status will improve Outcome: Progressing   Problem: Activity: Goal: Interest or engagement in activities will improve Outcome: Progressing Goal: Sleeping patterns will improve Outcome: Progressing

## 2024-02-03 NOTE — Progress Notes (Signed)
   02/03/24 0800  Psych Admission Type (Psych Patients Only)  Admission Status Voluntary  Psychosocial Assessment  Patient Complaints Substance abuse  Eye Contact Fair  Facial Expression Flat  Affect Anxious  Speech Slow  Interaction Assertive  Motor Activity Fidgety  Appearance/Hygiene Disheveled  Behavior Characteristics Cooperative;Appropriate to situation  Mood Anxious  Aggressive Behavior  Effect No apparent injury  Thought Process  Coherency Circumstantial  Content Blaming others  Delusions Paranoid  Perception Hallucinations  Hallucination Auditory;Visual  Judgment Impaired  Confusion WDL  Danger to Self  Current suicidal ideation? Active  Self-Injurious Behavior Some self-injurious ideation observed or expressed.  No lethal plan expressed   Agreement Not to Harm Self Yes  Description of Agreement verbal contracts for safety  Danger to Others  Danger to Others None reported or observed
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
## 2024-02-03 NOTE — Group Note (Signed)
 Recreation Therapy Group Note   Group Topic:Problem Solving  Group Date: 02/03/2024 Start Time: 0935 End Time: 0958 Facilitators: Naveah Brave-McCall, LRT,CTRS Location: [address] Dayroom   Group Topic: Communication, Team Building, Problem Solving  Goal Area(s) Addresses:  Patient will effectively work with peer towards shared goal.  Patient will identify skills used to make activity successful.  Patient will identify how skills used during activity can be used to reach post d/c goals.   Intervention: STEM Activity  Activity: Stage manager. In teams of 3-5, patients were given 12 plastic drinking straws and an equal length of masking tape. Using the materials provided, patients were asked to build a landing pad to catch a golf ball dropped from approximately 5 feet in the air. All materials were required to be used by the team in their design. LRT facilitated post-activity discussion.  Education: Pharmacist, community, Scientist, physiological, Discharge Planning   Education Outcome: Acknowledges education/In group clarification offered/Needs additional education.    Affect/Mood: N/A   Participation Level: Did not attend    Clinical Observations/Individualized Feedback:     Plan: Continue to engage patient in RT group sessions 2-3x/week.   Adyson Vanburen-McCall, LRT,CTRS 02/03/2024 12:59 PM

## 2024-02-03 NOTE — Progress Notes (Signed)
 Jud did not attend wrap up group.

## 2024-02-03 NOTE — Group Note (Signed)
 Date:  02/03/2024 Time:  2:14 AM  Group Topic/Focus:  Wrap-Up Group:   The focus of this group is to help patients review their daily goal of treatment and discuss progress on daily workbooks.    Participation Level:  Did Not Attend  Participation Quality:  n/a  Affect:  n/a  Cognitive:  n/a  Insight: None  Engagement in Group:  n/a  Modes of Intervention:  n/a  Additional Comments:  Patient did not attend  Dillard Frame 02/03/2024, 2:14 AM

## 2024-02-04 DIAGNOSIS — F319 Bipolar disorder, unspecified: Secondary | ICD-10-CM | POA: Diagnosis not present

## 2024-02-04 NOTE — [facility] Group Notes (Signed)
 Psychoeducational Group Note  Date:  02/04/2024 Time:  2000  Group Topic/Focus:  Wrap up group  Participation Level: Did Not Attend  Participation Quality:  Not Applicable  Affect:  Not Applicable  Cognitive:  Not Applicable  Insight:  Not Applicable  Engagement in Group: Not Applicable  Additional Comments:  Did not attend.   Trevor [name] S 02/04/2024, 10:07 PM

## 2024-02-04 NOTE — Plan of Care (Signed)
  Problem: Education: Goal: Emotional status will improve Outcome: Progressing Goal: Mental status will improve Outcome: Progressing Goal: Verbalization of understanding the information provided will improve Outcome: Progressing   Problem: Activity: Goal: Interest or engagement in activities will improve Outcome: Progressing   Problem: Coping: Goal: Ability to verbalize frustrations and anger appropriately will improve Outcome: Progressing

## 2024-02-04 NOTE — Progress Notes (Signed)
   02/04/24 1000  Psych Admission Type (Psych Patients Only)  Admission Status Voluntary  Psychosocial Assessment  Patient Complaints Substance abuse;Irritability  Eye Contact Fair  Facial Expression Flat  Affect Anxious  Speech Logical/coherent  Interaction Assertive  Motor Activity Fidgety  Appearance/Hygiene Disheveled  Behavior Characteristics Cooperative  Mood Anxious  Thought Process  Coherency Circumstantial  Content Blaming others  Delusions Paranoid  Perception Hallucinations  Hallucination Auditory;Visual  Judgment Impaired  Confusion None  Danger to Self  Self-Injurious Behavior Some self-injurious ideation observed or expressed.  No lethal plan expressed   Agreement Not to Harm Self Yes  Description of Agreement verbal  Danger to Others  Danger to Others None reported or observed

## 2024-02-04 NOTE — Plan of Care (Signed)
   Problem: Education: Goal: Knowledge of [facility] Education information/materials will improve Outcome: Progressing Goal: Emotional status will improve Outcome: Progressing Goal: Mental status will improve Outcome: Progressing Goal: Verbalization of understanding the information provided will improve Outcome: Progressing

## 2024-02-04 NOTE — Group Note (Signed)
 Date:  02/04/2024 Time:  10:48 AM  Group Topic/Focus:  Goals Group:   The focus of this group is to help patients establish daily goals to achieve during treatment and discuss how the patient can incorporate goal setting into their daily lives to aide in recovery. Orientation:   The focus of this group is to educate the patient on the purpose and policies of crisis stabilization and provide a format to answer questions about their admission.  The group details unit policies and expectations of patients while admitted.    Participation Level:  Did Not Attend  Park Bolk [facility] 02/04/2024, 10:48 AM

## 2024-02-04 NOTE — Group Note (Signed)
 Date:  02/04/2024 Time:  11:02 AM  Group Topic/Focus:  Emotional Education:   The focus of this group is to discuss what feelings/emotions are, and how they are experienced.    Participation Level:  Did Not Attend  Park Bolk [facility] 02/04/2024, 11:02 AM
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
## 2024-02-04 NOTE — Progress Notes (Signed)
 Patient received mild agitation protocol at 1710 by request. Patient stated he received it yesterday afternoon and that it helped "keep the voices down."

## 2024-02-05 ENCOUNTER — Encounter (HOSPITAL_COMMUNITY): Payer: Self-pay | Admitting: Nurse Practitioner

## 2024-02-05 DIAGNOSIS — F319 Bipolar disorder, unspecified: Secondary | ICD-10-CM | POA: Diagnosis not present

## 2024-02-05 DIAGNOSIS — Z765 Malingerer [conscious simulation]: Secondary | ICD-10-CM

## 2024-02-05 HISTORY — DX: Malingerer (conscious simulation): Z76.5

## 2024-02-05 NOTE — Group Note (Signed)
 Date:  02/05/2024 Time:  10:34 PM  Group Topic/Focus:  Wrap-Up Group:   The focus of this group is to help patients review their daily goal of treatment and discuss progress on daily workbooks.    Participation Level:  Did Not Attend  Participation Quality:  did not attend group  Affect:  Resistant  Cognitive:  Lacking  Insight: Lacking  Engagement in Group:  Lacking  Modes of Intervention:  Discussion  Additional Comments:  Patient did not participate in group  Trevor [name] 02/05/2024, 10:34 PM

## 2024-02-05 NOTE — Group Note (Signed)
 Date:  02/05/2024 Time:  9:30 AM  Group Topic/Focus:  Goals Group:   The focus of this group is to help patients establish daily goals to achieve during treatment and discuss how the patient can incorporate goal setting into their daily lives to aide in recovery. Orientation:   The focus of this group is to educate the patient on the purpose and policies of crisis stabilization and provide a format to answer questions about their admission.  The group details unit policies and expectations of patients while admitted.    Participation Level:  Did Not Attend

## 2024-02-05 NOTE — Plan of Care (Signed)
   Problem: Education: Goal: Emotional status will improve Outcome: Progressing Goal: Mental status will improve Outcome: Progressing   Problem: Activity: Goal: Interest or engagement in activities will improve Outcome: Progressing Goal: Sleeping patterns will improve Outcome: Progressing

## 2024-02-05 NOTE — Progress Notes (Signed)
 Po, prn, haldol  5 mg given to patient at 0833, and benadryl  50 mg  given to him at 1235 for continued aggression, irritability, and restlessness. Patient tolerated medication well with no side effect noted. Staff will continue to provide needed support to patient.

## 2024-02-05 NOTE — Plan of Care (Signed)
   Problem: Activity: Goal: Interest or engagement in activities will improve Outcome: Progressing   Problem: Coping: Goal: Ability to verbalize frustrations and anger appropriately will improve Outcome: Progressing   Problem: Safety: Goal: Periods of time without injury will increase Outcome: Progressing

## 2024-02-05 NOTE — BH Assessment (Signed)
 Pt was in bed at beginning of shift and refused to get up for wrap-up group. Pt states he is still hearing voices and is still having suicidal thoughts but did not want to elaborate on anything. Verbally contracted for safety. 15 min checks maintained throughout this noght.

## 2024-02-05 NOTE — Progress Notes (Signed)
   02/05/24 0800  Psych Admission Type (Psych Patients Only)  Admission Status Voluntary  Psychosocial Assessment  Patient Complaints Agitation;Anger;Irritability  Eye Contact Brief  Facial Expression Flat;Pained;Pensive  Affect Anxious;Angry;Irritable  Speech Logical/coherent  Interaction Assertive  Motor Activity Restless  Appearance/Hygiene Improved  Behavior Characteristics Cooperative;Appropriate to situation  Mood Anxious;Despair;Irritable  Thought Process  Coherency Circumstantial  Content Blaming others  Delusions Paranoid  Perception WDL  Hallucination Auditory  Judgment Impaired  Confusion None  Danger to Self  Current suicidal ideation? Active  Self-Injurious Behavior No self-injurious ideation or behavior indicators observed or expressed   Agreement Not to Harm Self Yes  Description of Agreement Verbal  Danger to Others  Danger to Others None reported or observed
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
## 2024-02-06 ENCOUNTER — Encounter (HOSPITAL_COMMUNITY): Payer: Self-pay

## 2024-02-06 DIAGNOSIS — F19929 Other psychoactive substance use, unspecified with intoxication, unspecified: Secondary | ICD-10-CM | POA: Insufficient documentation

## 2024-02-06 DIAGNOSIS — F319 Bipolar disorder, unspecified: Secondary | ICD-10-CM | POA: Diagnosis not present

## 2024-02-06 MED ORDER — HALOPERIDOL 2 MG PO TABS
2.0000 mg | ORAL_TABLET | Freq: Two times a day (BID) | ORAL | Status: DC
  Administered 2024-02-06 – 2024-02-08 (×4): 2 mg via ORAL
  Filled 2024-02-06 (×4): qty 1

## 2024-02-06 NOTE — Progress Notes (Signed)
 Did not attend wrap up AA.

## 2024-02-06 NOTE — Progress Notes (Signed)
   02/06/24 0800  Psych Admission Type (Psych Patients Only)  Admission Status Voluntary  Psychosocial Assessment  Patient Complaints Anxiety;Irritability  Eye Contact Fair  Facial Expression Flat  Affect Anxious;Angry  Speech Logical/coherent  Interaction Assertive  Motor Activity Slow  Appearance/Hygiene Disheveled  Behavior Characteristics Cooperative;Appropriate to situation  Mood Anxious;Depressed;Angry;Apprehensive;Irritable  Aggressive Behavior  Effect No apparent injury  Thought Process  Coherency Circumstantial  Content Blaming others  Delusions WDL  Perception Hallucinations  Hallucination Auditory  Judgment Impaired  Confusion WDL  Danger to Self  Current suicidal ideation? Active  Self-Injurious Behavior No self-injurious ideation or behavior indicators observed or expressed   Agreement Not to Harm Self Yes  Description of Agreement verbal  Danger to Others  Danger to Others None reported or observed

## 2024-02-06 NOTE — BH IP Treatment Plan (Signed)
 Interdisciplinary Treatment and Diagnostic Plan Update  02/06/2024 Time of Session: 1:10 AM - UPDATE Advay Volante MRN: [id number]  Principal Diagnosis: Bipolar 1 disorder, depressed (HCC)  Secondary Diagnoses: Principal Problem:   Bipolar 1 disorder, depressed (HCC)   Current Medications:  Current Facility-Administered Medications  Medication Dose Route Frequency Provider Last Rate Last Admin   acetaminophen  (TYLENOL ) tablet 650 mg  650 mg Oral Q6H PRN [name], NP   650 mg at 02/04/24 1853   alum & mag hydroxide-simeth (MAALOX/MYLANTA) 200-200-20 MG/5ML suspension 30 mL  30 mL Oral Q4H PRN [name], NP       amLODipine  (NORVASC ) tablet 10 mg  10 mg Oral Daily Nwoko, Agnes I, NP   10 mg at 02/06/24 0851   haloperidol  (HALDOL ) tablet 5 mg  5 mg Oral TID PRN [name], NP   5 mg at 02/05/24 4034   And   diphenhydrAMINE  (BENADRYL ) capsule 50 mg  50 mg Oral TID PRN [name], NP   50 mg at 02/05/24 1235   haloperidol  lactate (HALDOL ) injection 5 mg  5 mg Intramuscular TID PRN [name], NP       And   diphenhydrAMINE  (BENADRYL ) injection 50 mg  50 mg Intramuscular TID PRN [name], NP       And   LORazepam  (ATIVAN ) injection 2 mg  2 mg Intramuscular TID PRN [name], NP       haloperidol  lactate (HALDOL ) injection 10 mg  10 mg Intramuscular TID PRN [name], NP       And   diphenhydrAMINE  (BENADRYL ) injection 50 mg  50 mg Intramuscular TID PRN [name], NP       And   LORazepam  (ATIVAN ) injection 2 mg  2 mg Intramuscular TID PRN [name], NP       escitalopram  (LEXAPRO ) tablet 10 mg  10 mg Oral Daily [name], NP   10 mg at 02/06/24 0851   feeding supplement (ENSURE ENLIVE / ENSURE PLUS) liquid 237 mL  237 mL Oral BID BM Zouev, Dmitri, MD   237 mL at 02/06/24 1534   gabapentin  (NEURONTIN ) capsule 300 mg  300 mg Oral BID Asuncion Layer I, NP   300 mg at 02/06/24 7425   haloperidol  (HALDOL ) tablet 2  mg  2 mg Oral BID [name], Iline Mallory, MD       magnesium  hydroxide (MILK OF MAGNESIA) suspension 30 mL  30 mL Oral Daily PRN [name], NP       multivitamin with minerals tablet 1 tablet  1 tablet Oral Daily [name], NP   1 tablet at 02/06/24 9563   nicotine  (NICODERM CQ  - dosed in mg/24 hours) patch 14 mg  14 mg Transdermal Daily Attiah, Nadir, MD   14 mg at 02/06/24 8756   nicotine  polacrilex (NICORETTE ) gum 2 mg  2 mg Oral PRN [name], NP       OLANZapine  (ZYPREXA ) tablet 10 mg  10 mg Oral QHS Paliy, Alisa, MD   10 mg at 02/05/24 2127   thiamine  (Vitamin B-1) tablet 100 mg  100 mg Oral Daily Nwoko, Agnes I, NP   100 mg at 02/06/24 4332   thiamine  (VITAMIN B1) injection 100 mg  100 mg Intramuscular Once Nwoko, Agnes I, NP       PTA Medications: Medications Prior to Admission  Medication Sig Dispense Refill Last Dose/Taking   ALPRAZolam  (XANAX ) 0.5 MG tablet Take 1 tablet (0.5 mg total) by mouth 2 (two) times  daily as needed for anxiety (2nd line). (Patient not taking: Reported on 01/13/2024) 30 tablet 0    amLODipine  (NORVASC ) 5 MG tablet Take 1 tablet (5 mg total) by mouth daily. (Patient not taking: Reported on 01/13/2024) 30 tablet 0    busPIRone  (BUSPAR ) 7.5 MG tablet Take 1 tablet (7.5 mg total) by mouth 3 (three) times daily. (Patient not taking: Reported on 01/13/2024) 90 tablet 1    cefadroxil  (DURICEF) 500 MG capsule Take 1 capsule (500 mg total) by mouth 2 (two) times daily. (Patient not taking: Reported on 01/13/2024) 60 capsule 0    escitalopram  (LEXAPRO ) 10 MG tablet Take 1 tablet (10 mg total) by mouth daily. (Patient not taking: Reported on 01/13/2024) 30 tablet 1    gabapentin  (NEURONTIN ) 100 MG capsule Take 1 capsule (100 mg total) by mouth 2 (two) times daily. (Patient not taking: Reported on 01/13/2024) 60 capsule 0    hydrOXYzine  (ATARAX ) 50 MG tablet Take 1 tablet (50 mg total) by mouth 2 (two) times daily as needed for anxiety. (Patient not taking:  Reported on 01/13/2024) 60 tablet 0    ibuprofen  (ADVIL ) 800 MG tablet Take 800 mg by mouth every 8 (eight) hours as needed for moderate pain (pain score 4-6). (Patient not taking: Reported on 01/13/2024)      nicotine  polacrilex (NICORETTE ) 2 MG gum Take 1 each (2 mg total) by mouth as needed for smoking cessation. (Patient not taking: Reported on 01/13/2024) 100 tablet 0    OLANZapine  (ZYPREXA ) 5 MG tablet Take 1 tablet (5 mg total) by mouth 2 (two) times daily. (Patient not taking: Reported on 01/13/2024) 60 tablet 0     Patient Stressors: Neurosurgeon issue   Marital or family conflict   Medication change or noncompliance   Substance abuse    Patient Strengths: Motivation for treatment/growth   Treatment Modalities: Medication Management, Group therapy, Case management,  1 to 1 session with clinician, Psychoeducation, Recreational therapy.   Physician Treatment Plan for Primary Diagnosis: Bipolar 1 disorder, depressed (HCC) Long Term Goal(s): Improvement in symptoms so as ready for discharge   Short Term Goals: Ability to identify changes in lifestyle to reduce recurrence of condition will improve Ability to verbalize feelings will improve Ability to disclose and discuss suicidal ideas Ability to demonstrate self-control will improve Ability to identify and develop effective coping behaviors will improve Ability to maintain clinical measurements within normal limits will improve Compliance with prescribed medications will improve Ability to identify triggers associated with substance abuse/mental health issues will improve  Medication Management: Evaluate patient's response, side effects, and tolerance of medication regimen.  Therapeutic Interventions: 1 to 1 sessions, Unit Group sessions and Medication administration.  Evaluation of Outcomes: Progressing  Physician Treatment Plan for Secondary Diagnosis: Principal Problem:   Bipolar 1 disorder, depressed  (HCC)  Long Term Goal(s): Improvement in symptoms so as ready for discharge   Short Term Goals: Ability to identify changes in lifestyle to reduce recurrence of condition will improve Ability to verbalize feelings will improve Ability to disclose and discuss suicidal ideas Ability to demonstrate self-control will improve Ability to identify and develop effective coping behaviors will improve Ability to maintain clinical measurements within normal limits will improve Compliance with prescribed medications will improve Ability to identify triggers associated with substance abuse/mental health issues will improve     Medication Management: Evaluate patient's response, side effects, and tolerance of medication regimen.  Therapeutic Interventions: 1 to 1 sessions, Unit Group sessions and Medication  administration.  Evaluation of Outcomes: Progressing   RN Treatment Plan for Primary Diagnosis: Bipolar 1 disorder, depressed (HCC) Long Term Goal(s): Knowledge of disease and therapeutic regimen to maintain health will improve  Short Term Goals: Ability to remain free from injury will improve, Ability to verbalize frustration and anger appropriately will improve, Ability to verbalize feelings will improve, and Ability to disclose and discuss suicidal ideas  Medication Management: RN will administer medications as ordered by provider, will assess and evaluate patient's response and provide education to patient for prescribed medication. RN will report any adverse and/or side effects to prescribing provider.  Therapeutic Interventions: 1 on 1 counseling sessions, Psychoeducation, Medication administration, Evaluate responses to treatment, Monitor vital signs and CBGs as ordered, Perform/monitor CIWA, COWS, AIMS and Fall Risk screenings as ordered, Perform wound care treatments as ordered.  Evaluation of Outcomes: Progressing   LCSW Treatment Plan for Primary Diagnosis: Bipolar 1 disorder,  depressed (HCC) Long Term Goal(s): Safe transition to appropriate next level of care at discharge, Engage patient in therapeutic group addressing interpersonal concerns.  Short Term Goals: Engage patient in aftercare planning with referrals and resources, Increase ability to appropriately verbalize feelings, Facilitate acceptance of mental health diagnosis and concerns, and Identify triggers associated with mental health/substance abuse issues  Therapeutic Interventions: Assess for all discharge needs, 1 to 1 time with Social worker, Explore available resources and support systems, Assess for adequacy in community support network, Educate family and significant other(s) on suicide prevention, Complete Psychosocial Assessment, Interpersonal group therapy.  Evaluation of Outcomes: Progressing   Progress in Treatment: Attending groups: No. Participating in groups: No. Taking medication as prescribed: Yes. Toleration medication: Yes. Family/Significant other contact made: No, will contact:  DECLINED CONSENTS Patient understands diagnosis: Yes. Discussing patient identified problems/goals with staff: Yes. Medical problems stabilized or resolved: Yes. Denies suicidal/homicidal ideation: Yes. Issues/concerns per patient self-inventory: No.   New problem(s) identified: No, Describe:  none   New Short Term/Long Term Goal(s): detox, medication management for mood stabilization; elimination of SI thoughts; development of comprehensive mental wellness/sobriety plan     Patient Goals:  "Get rest and find a place to stay. You can send out residential referrals for me"   Discharge Plan or Barriers: Patient recently admitted. CSW will continue to follow and assess for appropriate referrals and possible discharge planning.      Reason for Continuation of Hospitalization: Anxiety Depression Medication stabilization Suicidal ideation   Estimated Length of Stay: 3 - 5 days  Last 3 [place] Suicide  Severity Risk Score: Flowsheet Row [facility] (Current) [facility] Most recent reading at 01/30/2024  2:08 PM ED from 01/29/2024 in [facility] at [facility] Most recent reading at 01/29/2024  7:39 PM ED from 01/29/2024 in [facility] Most recent reading at 01/29/2024  6:26 PM  C-SSRS RISK CATEGORY High Risk No Risk Error: Q3, 4, or 5 should not be populated when Q2 is No       Last PHQ 2/9 Scores:     No data to display          Scribe for Treatment Team: Gaston Dase O Chidiebere Wynn, LCSWA 02/06/2024 4:47 PM

## 2024-02-06 NOTE — [facility] Group Notes (Signed)
 Adult Psychoeducational Group Note  Date:  02/06/2024 Time:  10:58 AM  Group Topic/Focus:  Goals Group:   The focus of this group is to help patients establish daily goals to achieve during treatment and discuss how the patient can incorporate goal setting into their daily lives to aide in recovery.  Participation Level:  Did Not Attend  Participation Quality:  na  Affect:  na  Cognitive:  na  Insight: na  Engagement in Group:  na  Modes of Intervention:  na  Additional Comments:  Pt did not attend group  Alean Amen 02/06/2024, 10:58 AM

## 2024-02-06 NOTE — Group Note (Signed)
 Recreation Therapy Group Note   Group Topic:Stress Management  Group Date: 02/06/2024 Start Time: 0935 End Time: 1000 Facilitators: Cythina Mickelsen-McCall, LRT,CTRS Location: [address] Dayroom   Group Topic: Stress Management   Goal Area(s) Addresses:  Patient will actively participate in stress management techniques presented during session.  Patient will successfully identify benefit of practicing stress management post d/c.   Behavioral Response: N/A  Intervention: Relaxation exercise with ambient sound and script   Activity: Guided Imagery. LRT provided education, instruction, and demonstration on practice of visualization via guided imagery. Patient was asked to participate in the technique introduced during session. LRT debriefed including topics of mindfulness, stress management and specific scenarios each patient could use these techniques. Patients were given suggestions of ways to access scripts post d/c and encouraged to explore Youtube and other apps available on smartphones, tablets, and computers.  Education:  Stress Management, Discharge Planning.   Education Outcome: Acknowledges education  Clinical Observations/Feedback: Patient actively engaged in technique introduced, expressed no concerns and demonstrated ability to practice skill independently post d/c.    Affect/Mood: N/A   Participation Level: Did not attend    Clinical Observations/Individualized Feedback:     Plan: Continue to engage patient in RT group sessions 2-3x/week.   Teila Skalsky-McCall, LRT,CTRS 02/06/2024 12:27 PM

## 2024-02-06 NOTE — Plan of Care (Signed)
   Problem: Activity: Goal: Interest or engagement in activities will improve Outcome: Progressing   Problem: Coping: Goal: Ability to verbalize frustrations and anger appropriately will improve Outcome: Progressing   Problem: Safety: Goal: Periods of time without injury will increase Outcome: Progressing
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
## 2024-02-06 NOTE — Progress Notes (Deleted)
 Adult Psychoeducational Group Note  Date:  02/06/2024 Time:  8:54 PM  Group Topic/Focus:  Wrap-Up Group:   The focus of this group is to help patients review their daily goal of treatment and discuss progress on daily workbooks.  Participation Level:  Active  Participation Quality:  Appropriate  Affect:  Appropriate  Cognitive:  Appropriate  Insight: Appropriate  Engagement in Group:  Engaged  Modes of Intervention:  Discussion  Additional Comments:  attend wrap up AA group.  Trevor [name] 02/06/2024, 8:54 PM

## 2024-02-07 DIAGNOSIS — F319 Bipolar disorder, unspecified: Secondary | ICD-10-CM | POA: Diagnosis not present

## 2024-02-07 NOTE — Group Note (Signed)
 Recreation Therapy Group Note   Group Topic:Animal Assisted Therapy   Group Date: 02/07/2024 Start Time: 0945 End Time: 1030 Facilitators: Manuelito Poage-McCall, LRT,CTRS Location: [address] Dayroom   Animal-Assisted Activity (AAA) Program Checklist/Progress Notes Patient Eligibility Criteria Checklist & Daily Group note for Rec Tx Intervention  AAA/T Program Assumption of Risk Form signed by Patient/ or Parent Legal Guardian Yes  Patient understands his/her participation is voluntary Yes  Behavioral Response:    Education: Charity fundraiser, Appropriate Animal Interaction   Education Outcome: Acknowledges education.    Affect/Mood: N/A   Participation Level: Did not attend    Clinical Observations/Individualized Feedback:     Plan: Continue to engage patient in RT group sessions 2-3x/week.   Polly Barner-McCall, LRT,CTRS 02/07/2024 1:35 PM
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
## 2024-02-07 NOTE — [facility] Group Notes (Signed)

## 2024-02-07 NOTE — Progress Notes (Signed)
   02/07/24 0015  Psych Admission Type (Psych Patients Only)  Admission Status Voluntary  Psychosocial Assessment  Patient Complaints Anxiety;Irritability  Eye Contact Fair  Facial Expression Flat  Affect Anxious;Angry  Speech Logical/coherent  Interaction Assertive  Motor Activity Slow  Appearance/Hygiene Disheveled  Behavior Characteristics Cooperative  Mood Anxious;Depressed  Aggressive Behavior  Effect No apparent injury  Thought Process  Coherency Circumstantial  Content Blaming others  Delusions WDL  Perception Hallucinations  Hallucination Auditory  Judgment Impaired  Confusion WDL  Danger to Self  Current suicidal ideation? Active  Self-Injurious Behavior Some self-injurious ideation observed or expressed.  No lethal plan expressed   Agreement Not to Harm Self Yes  Description of Agreement verbal contract for safety  Danger to Others  Danger to Others None reported or observed

## 2024-02-07 NOTE — Progress Notes (Signed)
   02/07/24 0930  Psych Admission Type (Psych Patients Only)  Admission Status Voluntary  Psychosocial Assessment  Patient Complaints Anxiety  Eye Contact Fair  Facial Expression Other (Comment) (Slightly brighter)  Affect Appropriate to circumstance  Speech Logical/coherent  Interaction Assertive  Motor Activity Slow  Appearance/Hygiene Disheveled  Behavior Characteristics Cooperative;Appropriate to situation  Mood Anxious;Depressed  Aggressive Behavior  Effect No apparent injury  Thought Process  Coherency Circumstantial  Content Blaming others  Delusions WDL  Perception Hallucinations  Hallucination Auditory  Judgment Impaired  Confusion WDL  Danger to Self  Current suicidal ideation? Active  Self-Injurious Behavior Some self-injurious ideation observed or expressed.  No lethal plan expressed   Agreement Not to Harm Self Yes  Description of Agreement Verbal Contract for safety  Danger to Others  Danger to Others None reported or observed

## 2024-02-07 NOTE — Plan of Care (Signed)
   Problem: Activity: Goal: Interest or engagement in activities will improve Outcome: Progressing   Problem: Coping: Goal: Ability to verbalize frustrations and anger appropriately will improve Outcome: Progressing   Problem: Safety: Goal: Periods of time without injury will increase Outcome: Progressing

## 2024-02-07 NOTE — Plan of Care (Signed)
   Problem: Education: Goal: Emotional status will improve Outcome: Progressing Goal: Mental status will improve Outcome: Progressing   Problem: Activity: Goal: Interest or engagement in activities will improve Outcome: Progressing Goal: Sleeping patterns will improve Outcome: Progressing

## 2024-02-07 NOTE — Group Note (Signed)
 Date:  02/07/2024 Time:  9:44 PM  Group Topic/Focus:  Wrap-Up Group:   The focus of this group is to help patients review their daily goal of treatment and discuss progress on daily workbooks.    Additional Comments:  Pt was encouraged, but opted out of attending wrap up group this evening.   Trevor [name] 02/07/2024, 9:44 PM

## 2024-02-07 NOTE — Group Note (Signed)
 LCSW Group Therapy Note   Group Date: 02/07/2024 Start Time: 1100 End Time: 1200   Participation:  did not attend  Type of Therapy:  Group Therapy  Topic:  "Healing Flames: Navigating Anger with Compassion"  Objective:  Foster self-awareness and promote compassion toward oneself and others when dealing with anger.  Goals:  Help participants understand the underlying emotions and needs fueling anger. Provide coping strategies for healthier emotional expression and anger management.  Summary: This session explored anger as a volcano--an explosion driven by deeper feelings and unmet needs. Participants learned to identify anger triggers and underlying emotions, then practiced coping strategies like deep breathing, physical activity, and journaling. The group discussed healthy ways to manage anger before it escalates, using both personal reflection and shared experiences.  Therapeutic Modalities: Cognitive Behavioral Therapy (CBT): Challenging thoughts that fuel anger. Mindfulness: Increasing awareness of emotions and sensations.   Demita Tobia O Alizee Maple, LCSWA 02/07/2024  12:55 PM

## 2024-02-07 NOTE — Group Note (Signed)
 Date:  02/07/2024 Time:  10:53 AM  Group Topic/Focus:  Goals Group:   The focus of this group is to help patients establish daily goals to achieve during treatment and discuss how the patient can incorporate goal setting into their daily lives to aide in recovery.    Participation Level:  Did Not Attend  Participation Quality:  Did Not Attend  Affect:  Did Not Attend  Cognitive:  Did Not Attend  Insight: None  Engagement in Group:  Did Not Attend  Modes of Intervention:  Did Not Attend  Additional Comments:  Did Not Attend  Trevor [name] 02/07/2024, 10:53 AM

## 2024-02-08 DIAGNOSIS — F319 Bipolar disorder, unspecified: Secondary | ICD-10-CM | POA: Diagnosis not present

## 2024-02-08 MED ORDER — OLANZAPINE 10 MG PO TABS: 10.0000 mg | ORAL_TABLET | Freq: Every day | ORAL | 0 refills | Status: DC

## 2024-02-08 MED ORDER — GABAPENTIN 300 MG PO CAPS
300.0000 mg | ORAL_CAPSULE | Freq: Two times a day (BID) | ORAL | 0 refills | Status: DC
Start: 2024-02-08 — End: 2024-02-14

## 2024-02-08 MED ORDER — ESCITALOPRAM OXALATE 10 MG PO TABS: 10.0000 mg | ORAL_TABLET | Freq: Every day | ORAL | 0 refills | Status: DC

## 2024-02-08 MED ORDER — AMLODIPINE BESYLATE 10 MG PO TABS
10.0000 mg | ORAL_TABLET | Freq: Every day | ORAL | 0 refills | Status: DC
Start: 2024-02-09 — End: 2024-03-10

## 2024-02-08 MED ORDER — NICOTINE 14 MG/24HR TD PT24: 14.0000 mg | MEDICATED_PATCH | Freq: Every day | TRANSDERMAL | 0 refills | Status: DC

## 2024-02-08 NOTE — Plan of Care (Signed)
 Pt presents with flat affect and depressed mood. Denies SI, HI, AVH, and pain. Pt was isolative to his room for the evening and did not attend group. Minimal in interactions with staff. Medication compliant with no adverse reactions. Safety checks maintained at q 15 minutes. Support, encouragement, and reassurance offered to the pt.   Problem: Education: Goal: Emotional status will improve Outcome: Progressing Goal: Mental status will improve Outcome: Progressing Goal: Verbalization of understanding the information provided will improve Outcome: Progressing   Problem: Activity: Goal: Interest or engagement in activities will improve Outcome: Progressing   Problem: Safety: Goal: Periods of time without injury will increase Outcome: Progressing
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
## 2024-02-08 NOTE — [facility] Suicide Risk Assessment (Signed)
 [facility] INPATIENT:  Family/Significant Other Suicide Prevention Education  Suicide Prevention Education:  Suicide Prevention Education was reviewed thoroughly with patient, including risk factors, warning signs, and what to do.  Mobile Crisis services were described and that telephone number pointed out, with encouragement to patient to put this number in personal cell phone.  Brochure was provided to patient to share with natural supports.  Patient acknowledged the ways in which they are at risk, and how working through each of their issues can gradually start to reduce their risk factors.  Patient was encouraged to think of the information in the context of people in their own lives.  Patient denied having access to firearms  Patient verbalized understanding of information provided.  Patient endorsed a desire to live.

## 2024-02-08 NOTE — Transportation (Signed)
 02/08/2024  Trevor [name] DOB: 1980-09-29 MRN: [id number]   RIDER WAIVER AND RELEASE OF LIABILITY  For the purposes of helping with transportation needs, [facility] partners with outside transportation providers (taxi companies, [place], Catering manager.) to give [facility] patients or other approved people the choice of on-demand rides [employer]") to our buildings for non-emergency visits.  By using [employer], I, the person signing this document, on behalf of myself and/or any legal minors (in my care using the [employer]), agree:  Science writer given to me are supplied by independent, outside transportation providers who do not work for, or have any affiliation with, [employer]. [facility] [facility] is not a transportation company. [facility] has no control over the quality or safety of the rides I get using [employer]. [facility] has no control over whether any outside ride will happen on time or not. [facility] gives no guarantee on the reliability, quality, safety, or availability on any rides, or that no mistakes will happen. I know and accept that traveling by vehicle (car, truck, SVU, [place], bus, taxi, etc.) has risks of serious injuries such as disability, being paralyzed, and death. I know and agree the risk of using [employer] is mine alone, and not [employer]. Transport Services are provided "as is" and as are available. The transportation providers are in charge for all inspections and care of the vehicles used to provide these rides. I agree not to take legal action against [facility], its agents, employees, officers, directors, representatives, insurers, attorneys, assigns, successors, subsidiaries, and affiliates at any time for any reasons related directly or indirectly to using [employer]. I also agree not to take legal action against [facility] or its affiliates for any injury, death, or damage to property caused by or related to  using [employer]. I have read this Waiver and Release of Liability, and I understand the terms used in it and their legal meaning. This Waiver is freely and voluntarily given with the understanding that my right (or any legal minors) to legal action against  relating to [employer] is knowingly given up to use these services.   I attest that I read the Ride Waiver and Release of Liability to Trevor [name], gave Mr. Holsworth the opportunity to ask questions and answered the questions asked (if any). I affirm that Trevor [name] then provided consent for assistance with transportation.

## 2024-02-08 NOTE — Progress Notes (Signed)
 D: Pt A & O X 4. Denies SI, HI, AVH and pain at this time. D/C home as ordered.Taxi voucher given for transportation.  A: D/C instructions reviewed with pt including prescriptions and follow up appointment; compliance encouraged. All belongings from assigned locker returned to pt at time of departure. Scheduled medications given with verbal education and effects monitored. Safety checks maintained without incident till time of d/c.  R: Pt receptive to care. Compliant with medications when offered. Denies adverse drug reactions when assessed. Verbalized understanding related to d/c instructions. Signed belonging sheet in agreement with items received from locker. Ambulatory with a steady gait. Appears to be in no physical distress at time of departure.

## 2024-02-08 NOTE — Progress Notes (Signed)
  [facility] Adult Case Management Discharge Plan :  Will you be returning to the same living situation after discharge:  Yes,  pt is discharging home At discharge, do you have transportation home?: Yes,  CSW arranged [employer] taxi for 1030AM and was given bus passes to get to follow up appointments Do you have the ability to pay for your medications: Yes,  pt has active health insurance coverage  Release of information consent forms completed and in the chart;  Patient's signature needed at discharge.  Patient to Follow up at:  Follow-up Information     [facility] Go on 02/29/2024.   Why: You have an appointment for medication management services on 02/29/24 at 11:10 am. The appointment will be held in person, but you may call to switch to Virtual. Contact information: [address] [place] [address] [phone]         [facility] Go on 02/10/2024.   Specialty: Professional Counselor Why: Please go to this provider on 02/10/24 at 9:00 am for an assessment, to obtain therapy services.  You may also go on Monday through Friday, from 9 am to 1 pm. Contact information: [address] [place]  [address] [place] [address] [phone]                 Next level of care provider has access to [facility] Link:no  Safety Planning and Suicide Prevention discussed: Yes,  completed with patient at discharge, declined other consents    Has patient been referred to the Quitline?: Patient refused referral for treatment  Patient has been referred for addiction treatment: Patient refused referral for treatment.  Vonzell Guerin, LCSWA 02/08/2024, 9:33 AM

## 2024-02-08 NOTE — [facility] Suicide Risk Assessment (Signed)
 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  No EPS.  Eye Contact: Good.  Speech: Spontaneous.  Normal rate, tone and volume.  Normal prosody of speech.  Mood and Affect  Mood: Euthymic.  Affect: Full range and appropriate.  Thought Process  Thought Processes: Linear and goal directed.  Descriptions of Associations:Intact  Orientation:Full (Time, Place and Person)  Thought Content: Future oriented.  No current suicidal thoughts.  No homicidal thoughts.  No thoughts of violence.  No negative ruminative flooding.  No guilty ruminations.  No delusional theme.  No obsessions.  Hallucinations: No hallucination in any modality.  Sensorium  Memory: Good.  Judgment: Good.  Insight: Limited as he is not invested in an addiction treatment.  Executive Functions  Concentration: Good.  Attention Span: Good.  Recall: Good.  Fund of Knowledge: Good.  Language: Good   Psychomotor Activity  Normal psychomotor activity   Physical Exam: Physical Exam ROS Blood pressure (!) 144/121, pulse 79, temperature 98.9 F (37.2 C), temperature source Oral, resp. rate 16, height 5\' 11"  (1.803 m), weight 70.9 kg, SpO2 100%. Body mass index is 21.79 kg/m.  Mental Status Per Nursing Assessment::   On Admission:  Suicidal ideation indicated by patient, Self-harm thoughts, Suicide plan  Demographic Factors:   Male, Adolescent or young adult, Low socioeconomic status, and Unemployed  Loss Factors: Financial problems/change in socioeconomic status  Historical Factors: Prior suicide attempts, Family history of mental illness or substance abuse, and Impulsivity  Risk Reduction Factors:   Positive coping skills or problem solving skills  Continued Clinical Symptoms:  Patient has completely, of psychoactive substances.  No residual manic symptoms.  No residual psychotic symptoms.  No evidence of depression.  No overwhelming anxiety.  Cognitive Features That Contribute To Risk:  None    Suicide Risk:  Minimal: No current suicidal thoughts.  No current homicidal thoughts.  No current thoughts of violence.  Modifiable risk factor targeted during this admission include substance-induced psychosis and mania.  Patient is on combination of mood stabilizers.  Patient is aware of risk of accidental overdose on opiates after some days of abstinence.  No new psychosocial stressors. Patient is currently not a danger to himself or anyone else.  He is stable for care at the lower setting.  Follow-up Information     [facility] Go on 02/29/2024.   Why: You have an appointment for medication management services on 02/29/24 at 11:10 am. The appointment will be held in person, but you may call to switch to Virtual. Contact information: [address] [place] [address] [phone]         [facility] Go on 02/10/2024.   Specialty: Professional Counselor Why: Please go to this provider on 02/10/24 at 9:00 am for an assessment, to obtain therapy services.  You may also go on Monday through Friday, from 9 am to 1 pm. Contact information: [address]  [address] [place] [address] [phone]  Plan Of Care/Follow-up recommendations:  See discharge summary  Amelie Jury, MD 02/08/2024, 9:53 AM

## 2024-02-14 ENCOUNTER — Other Ambulatory Visit: Payer: Self-pay

## 2024-02-14 ENCOUNTER — Other Ambulatory Visit (HOSPITAL_COMMUNITY): Payer: Self-pay

## 2024-02-14 ENCOUNTER — Encounter (HOSPITAL_COMMUNITY): Payer: Self-pay | Admitting: Emergency Medicine

## 2024-02-14 ENCOUNTER — Emergency Department (HOSPITAL_COMMUNITY)
Admission: EM | Admit: 2024-02-14 | Discharge: 2024-02-14 | Disposition: A | Discharge status: Home / Self Care | Payer: MEDICAID | Attending: Emergency Medicine | Admitting: Emergency Medicine

## 2024-02-14 DIAGNOSIS — F3170 Bipolar disorder, currently in remission, most recent episode unspecified: Secondary | ICD-10-CM | POA: Diagnosis present

## 2024-02-14 MED ORDER — OLANZAPINE 10 MG IM SOLR
10.0000 mg | Freq: Once | INTRAMUSCULAR | Status: AC
  Administered 2024-02-14: 10 mg via INTRAMUSCULAR
  Filled 2024-02-14: qty 10

## 2024-02-14 MED ORDER — OLANZAPINE 10 MG PO TABS
10.0000 mg | ORAL_TABLET | Freq: Every day | ORAL | 0 refills | Status: DC
  Filled 2024-02-14: qty 30, 30d supply, fill #0

## 2024-02-14 MED ORDER — ESCITALOPRAM OXALATE 10 MG PO TABS
10.0000 mg | ORAL_TABLET | Freq: Every day | ORAL | 0 refills | Status: DC
  Filled 2024-02-14: qty 30, 30d supply, fill #0

## 2024-02-14 MED ORDER — AMLODIPINE BESYLATE 10 MG PO TABS
10.0000 mg | ORAL_TABLET | Freq: Every day | ORAL | 0 refills | Status: DC
  Filled 2024-02-14: qty 30, 30d supply, fill #0

## 2024-02-14 MED ORDER — GABAPENTIN 300 MG PO CAPS
300.0000 mg | ORAL_CAPSULE | Freq: Two times a day (BID) | ORAL | 0 refills | Status: DC
Start: 2024-02-14 — End: 2024-03-12
  Filled 2024-02-14: qty 60, 30d supply, fill #0

## 2024-02-14 MED ORDER — STERILE WATER FOR INJECTION IJ SOLN
INTRAMUSCULAR | Status: AC
  Filled 2024-02-14: qty 10

## 2024-02-14 NOTE — Progress Notes (Signed)
 Transition of Care [facility]) - Emergency Department Mini Assessment   Patient Details  Name: Trevor [name] MRN: [id number] Date of Birth: [birth year]  Transition of Care [facility]) CM/SW Contact:    Joanette Moynahan, RN Phone Number: 02/14/2024, 7:54 AM   Clinical Narrative: RNCM consulted in regards to medication assistance.  Pt has Medicaid insurance coverage and is not eligible for Medication Assistance Through [facility]) program. RNCM suggests sending Rx to [facility] at  [facility] Care Pharmacy ([facility]OC[facility])  to fill and bring to pt at bedside prior to discharge. No further CM needs communicated at this time.   ED Mini Assessment: What brought you to the Emergency Department? 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, 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/06/2024   Bipolar 1 disorder, depressed (HCC) 01/30/2024   Methamphetamine dependence (HCC) 10/21/2023   History of intravenous drug abuse 10/19/2023   Osteomyelitis of left arm (HCC) 10/19/2023   Septic olecranon bursitis of right elbow 10/19/2023   Hypokalemia 10/14/2023   Normocytic anemia 10/14/2023   Polysubstance abuse (HCC) 10/14/2023   Cellulitis of right elbow 10/13/2023   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   Bipolar disorder, mixed (HCC) 08/31/2023   Methamphetamine  dependence (HCC) 08/30/2023   Cannabis abuse, continuous use 08/30/2023   Tobacco abuse 08/05/2023   Polysubstance abuse (HCC) 08/05/2023   Cocaine use disorder, severe, dependence (HCC) 07/25/2018   Bipolar I disorder, most recent episode depressed (HCC) 07/24/2018   PCP:  Patient, No Pcp Per Pharmacy:   [id number] - [place], [place] - [address] AT [facility] [address] [place] [address] Phone: [phone] Fax: [phone]  [id number] [place], [place] - [address] AT [facility] [address] [place] [address] Phone: [phone] Fax: [phone]  [facility] [address], [place] - [address] [place] [address] Phone: [phone] Fax: [phone]  [facility] [address] [place] [address] Phone: [phone] Fax: [phone]  [facility] [address] [place] [address] Phone: [phone] Fax: [phone]

## 2024-02-14 NOTE — ED Triage Notes (Signed)
 Pt reports he was discharged from hospital but only given 1 weeks worth of medications and can not afford his psy meds.  He denies any S/I but is starting to hear the "mumbling in my head."  Pt admits to using meth and marijuana earlier today but is very calm and cooperative.

## 2024-02-14 NOTE — ED Provider Notes (Signed)
 [place] EMERGENCY DEPARTMENT AT [facility] Provider Note   CSN: [id number] Arrival date & time: 02/14/24  0345     History  No chief complaint on file.   Trevor [name] is a 43 y.o. male.  Pt complains of being out of his psychiatric medications.  Pt reports he can not get filled due to the cost.  Pt reports he has to have zyprexa  or he hallucinates. Pt admits to substance abuse. No thoughts of harm to himself or others.  Pt reports that starts if he does not take his medication   The history is provided by the patient. No language interpreter was used.       Home Medications Prior to Admission medications   Medication Sig Start Date End Date Taking? Authorizing Provider  amLODipine  (NORVASC ) 10 MG tablet Take 1 tablet (10 mg total) by mouth daily. 02/09/24 03/10/24  Amelie Jury, MD  escitalopram  (LEXAPRO ) 10 MG tablet Take 1 tablet (10 mg total) by mouth daily. 02/08/24   IzediunoIline Mallory, MD  gabapentin  (NEURONTIN ) 300 MG capsule Take 1 capsule (300 mg total) by mouth 2 (two) times daily. 02/08/24   [name], Iline Mallory, MD  nicotine  (NICODERM CQ  - DOSED IN MG/24 HOURS) 14 mg/24hr patch Place 1 patch (14 mg total) onto the skin daily. 02/09/24   Amelie Jury, MD  OLANZapine  (ZYPREXA ) 10 MG tablet Take 1 tablet (10 mg total) by mouth at bedtime. 02/08/24   Amelie Jury, MD      Allergies    Penicillins    Review of Systems   Review of Systems  All other systems reviewed and are negative.   Physical Exam Updated Vital Signs BP (!) 124/95 (BP Location: Right Arm)   Pulse 82   Temp (!) 97.5 F (36.4 C)   Resp 16   SpO2 100%  Physical Exam Vitals and nursing note reviewed.  Constitutional:      Appearance: He is well-developed.  HENT:     Head: Normocephalic.     Right Ear: External ear normal.     Left Ear: External ear normal.     Mouth/Throat:     Mouth: Mucous membranes are moist.  Cardiovascular:     Rate and Rhythm:  Normal rate and regular rhythm.  Pulmonary:     Effort: Pulmonary effort is normal.  Abdominal:     General: There is no distension.  Musculoskeletal:        General: Normal range of motion.     Cervical back: Normal range of motion.  Skin:    General: Skin is warm.  Neurological:     Mental Status: He is alert and oriented to person, place, and time.     ED Results / Procedures / Treatments   Labs (all labs ordered are listed, but only abnormal results are displayed) Labs Reviewed - No data to display  EKG None  Radiology No results found.  Procedures Procedures    Medications Ordered in ED Medications - No data to display  ED Course/ Medical Decision Making/ A&P                                 Medical Decision Making Pt is unable to get his medication.  Pt reports he needs zyprexa  or he has hallucinations.  Pt denies any thoughts of suicide or homicide but feels like he may start having hllucinations if  he does not take his medication   Amount and/or Complexity of Data Reviewed Discussion of management or test interpretation with external provider(s): Toc consulted and will have TOC pharmacy fill medications  Risk Prescription drug management. Risk Details: Pt given a dosage of zyprexa  here.  Rx for [facility] pharmacy           Final Clinical Impression(s) / ED Diagnoses Final diagnoses:  Bipolar disorder in partial remission, most recent episode unspecified type [facility])    Rx / DC Orders ED Discharge Orders          Ordered    amLODipine  (NORVASC ) 10 MG tablet  Daily        02/14/24 0751    escitalopram  (LEXAPRO ) 10 MG tablet  Daily        02/14/24 0751    gabapentin  (NEURONTIN ) 300 MG capsule  2 times daily        02/14/24 0751    OLANZapine  (ZYPREXA ) 10 MG tablet  Daily at bedtime        02/14/24 0751           An After Visit Summary was printed and given to the patient.    Sandi Crosby, [place] 02/14/24 1610    Sueellen Emery,  MD 02/14/24 1100

## 2024-02-22 ENCOUNTER — Other Ambulatory Visit: Payer: Self-pay

## 2024-02-22 ENCOUNTER — Emergency Department (HOSPITAL_COMMUNITY)
Admission: EM | Admit: 2024-02-22 | Discharge: 2024-02-22 | Disposition: A | Discharge status: Home / Self Care | Payer: MEDICAID | Attending: Emergency Medicine | Admitting: Emergency Medicine

## 2024-02-22 DIAGNOSIS — K920 Hematemesis: Secondary | ICD-10-CM | POA: Diagnosis present

## 2024-02-22 DIAGNOSIS — E876 Hypokalemia: Secondary | ICD-10-CM | POA: Diagnosis not present

## 2024-02-22 LAB — COMPREHENSIVE METABOLIC PANEL WITH GFR
ALT: 21 U/L (ref 0–44)
AST: 22 U/L (ref 15–41)
Albumin: 3.8 g/dL (ref 3.5–5.0)
Alkaline Phosphatase: 53 U/L (ref 38–126)
Anion gap: 8 (ref 5–15)
BUN: 10 mg/dL (ref 6–20)
CO2: 27 mmol/L (ref 22–32)
Calcium: 8.9 mg/dL (ref 8.9–10.3)
Chloride: 103 mmol/L (ref 98–111)
Creatinine, Ser: 0.82 mg/dL (ref 0.61–1.24)
GFR, Estimated: >60 mL/min (ref >60)
Glucose, Bld: 112 mg/dL — ABNORMAL HIGH (ref 70–99)
Potassium: 3.4 mmol/L — ABNORMAL LOW (ref 3.5–5.1)
Sodium: 138 mmol/L (ref 135–145)
Total Bilirubin: 0.8 mg/dL (ref 0.0–1.2)
Total Protein: 7 g/dL (ref 6.5–8.1)

## 2024-02-22 LAB — CBC
HCT: 36.8 % — ABNORMAL LOW (ref 39.0–52.0)
Hemoglobin: 11.9 g/dL — ABNORMAL LOW (ref 13.0–17.0)
MCH: 29 pg (ref 26.0–34.0)
MCHC: 32.3 g/dL (ref 30.0–36.0)
MCV: 89.8 fL (ref 80.0–100.0)
Platelets: 229 10*3/uL (ref 150–400)
RBC: 4.1 MIL/uL — ABNORMAL LOW (ref 4.22–5.81)
RDW: 13.9 % (ref 11.5–15.5)
WBC: 6.1 10*3/uL (ref 4.0–10.5)
nRBC: 0 % (ref 0.0–0.2)

## 2024-02-22 NOTE — Discharge Instructions (Signed)
 Return for any problem.  ?

## 2024-02-22 NOTE — ED Provider Notes (Signed)
 [place] EMERGENCY DEPARTMENT AT [facility] Provider Note   CSN: [id number] Arrival date & time: 02/22/24  1501     History  Chief Complaint  Patient presents with   Hematemesis    Trevor [name] is a 43 y.o. male.  43 year old male with prior medical history as detailed below presents for evaluation.  Patient complains of vomiting bloody emesis twice.  These episodes occurred this morning.  His last emesis was around 6 AM.  He denies any subsequent vomiting.  He is unable to quantitate the amount of blood that he saw or the nature of the emesis that he had.  It is somewhat unclear whether he self-induced his vomiting episode.  He is not forthcoming with details.  He denies associated fever, abdominal pain, bloody stool.  He reports that his last bowel movement was perhaps 2 days ago.  He denies bloody stool, dark stool, other complaint.  He denies prior history of GI bleeding.  Patient reports history of IV methamphetamine use.  He last injected methamphetamine yesterday.  The history is provided by the patient.       Home Medications Prior to Admission medications   Medication Sig Start Date End Date Taking? Authorizing Provider  amLODipine  (NORVASC ) 10 MG tablet Take 1 tablet (10 mg total) by mouth daily. 02/14/24 03/15/24  Sandi Crosby, PA-C  escitalopram  (LEXAPRO ) 10 MG tablet Take 1 tablet (10 mg total) by mouth daily. 02/14/24   Sofia, Leslie K, PA-C  gabapentin  (NEURONTIN ) 300 MG capsule Take 1 capsule (300 mg total) by mouth 2 (two) times daily. 02/14/24   Sofia, Leslie K, PA-C  nicotine  (NICODERM CQ  - DOSED IN MG/24 HOURS) 14 mg/24hr patch Place 1 patch (14 mg total) onto the skin daily. 02/09/24   Amelie Jury, MD  OLANZapine  (ZYPREXA ) 10 MG tablet Take 1 tablet (10 mg total) by mouth at bedtime. 02/14/24   Sofia, Leslie K, PA-C      Allergies    Penicillins    Review of Systems   Review of Systems  All other systems reviewed and are  negative.   Physical Exam Updated Vital Signs BP 118/82 (BP Location: Left Arm)   Pulse 68   Temp 97.7 F (36.5 C) (Oral)   Resp 16   SpO2 99%  Physical Exam Vitals and nursing note reviewed.  Constitutional:      General: He is not in acute distress.    Appearance: Normal appearance. He is well-developed.  HENT:     Head: Normocephalic and atraumatic.  Eyes:     Conjunctiva/sclera: Conjunctivae normal.     Pupils: Pupils are equal, round, and reactive to light.  Cardiovascular:     Rate and Rhythm: Normal rate and regular rhythm.     Heart sounds: Normal heart sounds.  Pulmonary:     Effort: Pulmonary effort is normal. No respiratory distress.     Breath sounds: Normal breath sounds.  Abdominal:     General: There is no distension.     Palpations: Abdomen is soft.     Tenderness: There is no abdominal tenderness.  Genitourinary:    Comments: Declines DRE Musculoskeletal:        General: No deformity. Normal range of motion.     Cervical back: Normal range of motion and neck supple.  Skin:    General: Skin is warm and dry.  Neurological:     General: No focal deficit present.     Mental Status: He is  alert and oriented to person, place, and time.     ED Results / Procedures / Treatments   Labs (all labs ordered are listed, but only abnormal results are displayed) Labs Reviewed  CBC - Abnormal; Notable for the following components:      Result Value   RBC 4.10 (*)    Hemoglobin 11.9 (*)    HCT 36.8 (*)    All other components within normal limits  COMPREHENSIVE METABOLIC PANEL WITH GFR  RAPID URINE DRUG SCREEN, HOSP PERFORMED  POC OCCULT BLOOD, ED    EKG None  Radiology No results found.  Procedures Procedures    Medications Ordered in ED Medications - No data to display  ED Course/ Medical Decision Making/ A&P                                 Medical Decision Making Amount and/or Complexity of Data Reviewed Labs: ordered.    Medical  Screen Complete  This patient presented to the ED with complaint of vomiting blood.  This complaint involves an extensive number of treatment options. The initial differential diagnosis includes, but is not limited to, GI bleed, metabolic abnormality, etc.  This presentation is: Acute, Self-Limited, Previously Undiagnosed, Uncertain Prognosis, Complicated, Systemic Symptoms, and Threat to Life/Bodily Function  Patient reports 2 episodes of bloody emesis this morning.  He reports that his last emesis was around 6 AM.  He saw blood in vomit.  He has not had any other vomiting since.  He denies other complaint.  He denied abdominal pain, bloody stool.  Obtain screening labs are on the whole reassuring.  Hemoglobin is 11.9.  BUN is 10.  Patient declined DRE/stool Hemoccult.  Patient reassured by findings.  He is taking p.o. well prior to discharge.  He is appropriate for close follow-up in the outpatient setting.  Strict return precautions given understood.  Additional history obtained: External records from outside sources obtained and reviewed including prior ED visits and prior Inpatient records.   Problem List / ED Course:  Reported hematemesis  Disposition:  After consideration of the diagnostic results and the patients response to treatment, I feel that the patent would benefit from close outpatient follow-up.          Final Clinical Impression(s) / ED Diagnoses Final diagnoses:  Hematemesis, unspecified whether nausea present    Rx / DC Orders ED Discharge Orders     None         Burnette Carte, MD 02/22/24 [phone]

## 2024-02-22 NOTE — ED Triage Notes (Addendum)
 Pt c/o vomiting blood since this AM. Denies abd pain.Pt states he is an IV meth user, last had yesterday.

## 2024-02-24 ENCOUNTER — Encounter (HOSPITAL_COMMUNITY): Payer: Self-pay

## 2024-02-24 ENCOUNTER — Other Ambulatory Visit: Payer: Self-pay

## 2024-02-24 ENCOUNTER — Emergency Department (HOSPITAL_COMMUNITY)
Admission: EM | Admit: 2024-02-24 | Discharge: 2024-02-25 | Disposition: A | Discharge status: Other Acute Inpatient Hospital | Payer: MEDICAID | Attending: Emergency Medicine | Admitting: Emergency Medicine

## 2024-02-24 DIAGNOSIS — F1910 Other psychoactive substance abuse, uncomplicated: Secondary | ICD-10-CM

## 2024-02-24 DIAGNOSIS — R440 Auditory hallucinations: Secondary | ICD-10-CM | POA: Diagnosis present

## 2024-02-24 DIAGNOSIS — R45851 Suicidal ideations: Secondary | ICD-10-CM | POA: Insufficient documentation

## 2024-02-24 DIAGNOSIS — Z765 Malingerer [conscious simulation]: Secondary | ICD-10-CM

## 2024-02-24 DIAGNOSIS — F319 Bipolar disorder, unspecified: Secondary | ICD-10-CM | POA: Diagnosis not present

## 2024-02-24 DIAGNOSIS — Z5900 Homelessness unspecified: Secondary | ICD-10-CM | POA: Insufficient documentation

## 2024-02-24 DIAGNOSIS — F29 Unspecified psychosis not due to a substance or known physiological condition: Secondary | ICD-10-CM | POA: Diagnosis present

## 2024-02-24 LAB — COMPREHENSIVE METABOLIC PANEL WITH GFR
ALT: 19 U/L (ref 0–44)
AST: 23 U/L (ref 15–41)
Albumin: 4 g/dL (ref 3.5–5.0)
Alkaline Phosphatase: 52 U/L (ref 38–126)
Anion gap: 7 (ref 5–15)
BUN: 8 mg/dL (ref 6–20)
CO2: 29 mmol/L (ref 22–32)
Calcium: 9.3 mg/dL (ref 8.9–10.3)
Chloride: 101 mmol/L (ref 98–111)
Creatinine, Ser: 0.95 mg/dL (ref 0.61–1.24)
GFR, Estimated: >60 mL/min (ref >60)
Glucose, Bld: 95 mg/dL (ref 70–99)
Potassium: 4.2 mmol/L (ref 3.5–5.1)
Sodium: 137 mmol/L (ref 135–145)
Total Bilirubin: 0.6 mg/dL (ref 0.0–1.2)
Total Protein: 7.2 g/dL (ref 6.5–8.1)

## 2024-02-24 LAB — CBC WITH DIFFERENTIAL/PLATELET
Abs Immature Granulocytes: 0.01 10*3/uL (ref 0.00–0.07)
Basophils Absolute: 0 10*3/uL (ref 0.0–0.1)
Basophils Relative: 1 %
Eosinophils Absolute: 0.1 10*3/uL (ref 0.0–0.5)
Eosinophils Relative: 2 %
HCT: 45 % (ref 39.0–52.0)
Hemoglobin: 15 g/dL (ref 13.0–17.0)
Immature Granulocytes: 0 %
Lymphocytes Relative: 24 %
Lymphs Abs: 1.4 10*3/uL (ref 0.7–4.0)
MCH: 29.4 pg (ref 26.0–34.0)
MCHC: 33.3 g/dL (ref 30.0–36.0)
MCV: 88.2 fL (ref 80.0–100.0)
Monocytes Absolute: 0.3 10*3/uL (ref 0.1–1.0)
Monocytes Relative: 6 %
Neutro Abs: 3.9 10*3/uL (ref 1.7–7.7)
Neutrophils Relative %: 67 %
Platelets: 268 10*3/uL (ref 150–400)
RBC: 5.1 MIL/uL (ref 4.22–5.81)
RDW: 13.8 % (ref 11.5–15.5)
WBC: 5.7 10*3/uL (ref 4.0–10.5)
nRBC: 0 % (ref 0.0–0.2)

## 2024-02-24 LAB — RAPID URINE DRUG SCREEN, HOSP PERFORMED
Amphetamines: POSITIVE — AB
Barbiturates: NOT DETECTED
Benzodiazepines: POSITIVE — AB
Cocaine: NOT DETECTED
Opiates: NOT DETECTED
Tetrahydrocannabinol: POSITIVE — AB

## 2024-02-24 LAB — ETHANOL: Alcohol, Ethyl (B): <15 mg/dL (ref <15)

## 2024-02-24 MED ORDER — ESCITALOPRAM OXALATE 10 MG PO TABS
10.0000 mg | ORAL_TABLET | Freq: Every day | ORAL | Status: DC
  Administered 2024-02-24: 10 mg via ORAL
  Filled 2024-02-24: qty 1

## 2024-02-24 MED ORDER — GABAPENTIN 300 MG PO CAPS
300.0000 mg | ORAL_CAPSULE | Freq: Two times a day (BID) | ORAL | Status: DC
  Administered 2024-02-24: 300 mg via ORAL
  Filled 2024-02-24: qty 3

## 2024-02-24 MED ORDER — OLANZAPINE 10 MG PO TABS
10.0000 mg | ORAL_TABLET | Freq: Every day | ORAL | Status: DC
  Administered 2024-02-24: 10 mg via ORAL
  Filled 2024-02-24: qty 1

## 2024-02-24 NOTE — ED Provider Notes (Signed)
 [place] EMERGENCY DEPARTMENT AT [facility] Provider Note   CSN: [id number] Arrival date & time: 02/24/24  1220     History  Chief Complaint  Patient presents with   Hallucinations   Suicidal    Trevor [name] is a 43 y.o. male.  43 year old male with a past medical history of IV methamphetamine use, marijuana use, suicidal ideations presents to the ED with a chief complaint of suicidal ideations.  Patient reports he is having some auditory hallucinations, telling him to kill himself.  His plan is to "inject a bunch of stuff ", reports he was previously on fentanyl  and plans a fentanyl  overdose.  He does take medication for mental health, reports keeping up with his medication.  He denies any chest pain, shortness of breath, abdominal pain, other complaints.  The history is provided by the patient.       Home Medications Prior to Admission medications   Medication Sig Start Date End Date Taking? Authorizing Provider  amLODipine  (NORVASC ) 10 MG tablet Take 1 tablet (10 mg total) by mouth daily. 02/14/24 03/15/24  Sandi Crosby, PA-C  escitalopram  (LEXAPRO ) 10 MG tablet Take 1 tablet (10 mg total) by mouth daily. 02/14/24   Sofia, Leslie K, PA-C  gabapentin  (NEURONTIN ) 300 MG capsule Take 1 capsule (300 mg total) by mouth 2 (two) times daily. 02/14/24   Sandi Crosby, PA-C  nicotine  (NICODERM CQ  - DOSED IN MG/24 HOURS) 14 mg/24hr patch Place 1 patch (14 mg total) onto the skin daily. 02/09/24   Amelie Jury, MD  OLANZapine  (ZYPREXA ) 10 MG tablet Take 1 tablet (10 mg total) by mouth at bedtime. 02/14/24   Sofia, Leslie K, PA-C      Allergies    Penicillins and Augmentin [amoxicillin-pot clavulanate]    Review of Systems   Review of Systems  Constitutional:  Negative for chills and fever.  HENT:  Negative for sore throat.   Respiratory:  Negative for shortness of breath.   Cardiovascular:  Negative for chest pain.  Gastrointestinal:  Negative for  abdominal pain, nausea and vomiting.  Genitourinary:  Negative for flank pain.  Psychiatric/Behavioral:  Positive for suicidal ideas.   All other systems reviewed and are negative.   Physical Exam Updated Vital Signs BP (!) 140/81   Pulse 60   Temp 97.9 F (36.6 C) (Oral)   Resp 18   Ht 6' (1.829 m)   Wt 77.1 kg   SpO2 99%   BMI 23.06 kg/m  Physical Exam Vitals and nursing note reviewed.  Constitutional:      Appearance: He is well-developed.  HENT:     Head: Normocephalic and atraumatic.  Eyes:     General: No scleral icterus.    Pupils: Pupils are equal, round, and reactive to light.  Cardiovascular:     Heart sounds: Normal heart sounds.  Pulmonary:     Effort: Pulmonary effort is normal.     Breath sounds: Normal breath sounds. No wheezing.  Chest:     Chest wall: No tenderness.  Abdominal:     General: Bowel sounds are normal. There is no distension.     Palpations: Abdomen is soft.     Tenderness: There is no abdominal tenderness.  Musculoskeletal:        General: No tenderness or deformity.     Cervical back: Normal range of motion.  Skin:    General: Skin is warm and dry.  Neurological:     Mental Status: He  is alert and oriented to person, place, and time.  Psychiatric:        Attention and Perception: He perceives auditory hallucinations.        Behavior: Behavior is cooperative.        Thought Content: Thought content normal.     Comments: Patient is cooperative, with normal affect, currently eating a sandwich under no distress with stable vitals.     ED Results / Procedures / Treatments   Labs (all labs ordered are listed, but only abnormal results are displayed) Labs Reviewed  RAPID URINE DRUG SCREEN, HOSP PERFORMED - Abnormal; Notable for the following components:      Result Value   Benzodiazepines POSITIVE (*)    Amphetamines POSITIVE (*)    Tetrahydrocannabinol POSITIVE (*)    All other components within normal limits  COMPREHENSIVE  METABOLIC PANEL WITH GFR  ETHANOL  CBC WITH DIFFERENTIAL/PLATELET    EKG EKG Interpretation Date/Time:  Friday February 24 2024 14:43:28 EDT Ventricular Rate:  58 PR Interval:  126 QRS Duration:  100 QT Interval:  386 QTC Calculation: 378 R Axis:   90  Text Interpretation: Sinus bradycardia Rightward axis Biventricular hypertrophy Abnormal ECG When compared with ECG of 29-Jan-2024 22:27, PREVIOUS ECG IS PRESENT No significant change since last tracing Confirmed by Sueellen Emery [phone]) on 02/24/2024 5:06:20 PM  Radiology No results found.  Procedures Procedures    Medications Ordered in ED Medications  OLANZapine  (ZYPREXA ) tablet 10 mg (has no administration in time range)  escitalopram  (LEXAPRO ) tablet 10 mg (has no administration in time range)  gabapentin  (NEURONTIN ) capsule 300 mg (has no administration in time range)    ED Course/ Medical Decision Making/ A&P Clinical Course as of 02/24/24 1936  Fri Feb 24, 2024  1549 Benzodiazepines(!): POSITIVE [JS]  1549 Amphetamines(!): POSITIVE [JS]  1549 Tetrahydrocannabinol(!): POSITIVE [JS]    Clinical Course User Index [JS] Howie Rufus, PA-C                                 Medical Decision Making Amount and/or Complexity of Data Reviewed Labs:  Decision-making details documented in ED Course.     This patient presents to the ED for concern of SI, auditory hallucinations, this involves a number of treatment options, and is a complaint that carries with it a high risk of complications and morbidity.  The differential diagnosis includes psychiatric component, electrolyte imbalance, illicit substance use.    Co morbidities: Discussed in HPI   Brief History:  See HPI.   EMR reviewed including pt PMHx, past surgical history and past visits to ER.   See HPI for more details   Lab Tests:  I ordered and independently interpreted labs.  The pertinent results include:    I personally reviewed all laboratory work  and imaging. Metabolic panel without any acute abnormality specifically kidney function within normal limits and no significant electrolyte abnormalities. CBC without leukocytosis or significant anemia.  Imaging Studies:  No imaging studies ordered for this patient  Cardiac Monitoring:  The patient was maintained on a cardiac monitor.  I personally viewed and interpreted the cardiac monitored which showed an underlying rhythm of: Sinus bradycardia  EKG non-ischemic   Medicines ordered:  N/A  Reevaluation:  After the interventions noted above I re-evaluated patient and found that they have :stayed the same  Social Determinants of Health:  The patient's social determinants of health were a factor in the  care of this patient   Problem List / ED Course:  Patient with a prior history of amphetamine use, THC use presents to the ED with a chief complaint of auditory hallucinations.  Reports he hears voices telling him to hurt himself, reports he has a plan of "fentanyl  overdose".  Vitals are within normal limits, he is eating a sandwich under no distress at the time of my evaluation.  His blood work is within normal limits.  CBC is without any acute finding.  Hemoglobin is within normal limits.  CMP with no electrolyte derangement, current levels unremarkable.  Ethanol level is normal.  UDS is positive for benzos, amphetamine, THC. Labs are within normal limits, vitals are stable.  He appears in no distress.  He does not have any complaints of chest pain, shortness of breath, abdominal pain.  He is medically clear for psychiatric evaluation.  He has consultation has been ordered.  Dispostion:  Patient is medically clear for psychiatric evaluation.  Per psychiatric evaluation they recommend inpatient psychiatric admission.   Portions of this note were generated with Scientist, clinical (histocompatibility and immunogenetics). Dictation errors may occur despite best attempts at proofreading.  Final Clinical Impression(s) /  ED Diagnoses Final diagnoses:  Suicidal ideation  Auditory hallucinations    Rx / DC Orders ED Discharge Orders     None         Luellen Sages, PA-C 02/24/24 1936    Sueellen Emery, MD 02/24/24 [id number]

## 2024-02-24 NOTE — ED Notes (Signed)
 Pt brought back to [facility] in paper scrubs. Given a [place] sandwich and crackers with something to drink. States that he has been hearing voices telling him to use drugs and hurt himself. He has not used meth or any other drugs in 3 days. Pt has also not taken his prescribed meds in a couple weeks. Calm and Cooperative at this time. Denies HI

## 2024-02-24 NOTE — ED Provider Triage Note (Signed)
 Emergency Medicine Provider Triage Evaluation Note  Trevor [name] , a 43 y.o. male  was evaluated in triage.  Pt complains of auditory halluciantions telling him to kill himself. Started last night. Denies visual. Reports IVDU of meth three days ago with marijuana use. He reports he does feel suicidal with plan for Fent OD. Came voluntarily.   Review of Systems  Positive:  Negative:   Physical Exam  BP 125/87 (BP Location: Left Arm)   Pulse (!) 59   Temp 98.2 F (36.8 C) (Oral)   Resp 18   Ht 6' (1.829 m)   Wt 77.1 kg   SpO2 96%   BMI 23.06 kg/m  Gen:   Awake, no distress   Resp:  Normal effort  MSK:   Moves extremities without difficulty  Other:  Patient calm, sitting on stretcher, does not appear to be in any acute distress. Cooperative.   Medical Decision Making  Medically screening exam initiated at 2:28 PM.  Appropriate orders placed.  Trevor [name] was informed that the remainder of the evaluation will be completed by another provider, this initial triage assessment does not replace that evaluation, and the importance of remaining in the ED until their evaluation is complete.  Given SI with plan, will fill out IVC in case it needs to be filed. Patient is here voluntarily.. Medical clearance labs ordered.    Trevor [name], [place] 02/24/24 1431

## 2024-02-24 NOTE — ED Notes (Signed)
 Belongings placed in one bag and in locker 5

## 2024-02-24 NOTE — Progress Notes (Signed)
 Inpatient Psychiatric Referral  Patient was recommended inpatient per Kyra Weber, NP. There are no available beds at [facility], per [facility] Deno Flair, RN. Patient was referred to the following out of network facilities:  Destination  Service Provider Request Status Address Phone Fax  [facility] Pending - Request Sent [address] [place] [address] [phone]  [facility] Pending - Request Sent [address] Johnella Naas [place] [address] [phone]  [facility] Pending - Request Sent [address] [place] [address] [phone]  [facility] Pending - Request Sent [address] [place] [address] [phone]  [facility] Pending - Request Sent [address] Shelva Dice [place] [address] [phone]  [facility] Pending - Request Sent [address], [place] [address] [phone]  [facility] Pending - Request Sent [address] Sharren Decree [place] [phone]  [facility] Pending - Request Sent [address] [place] [address] [phone]   Situation ongoing, CSW to continue following and update chart as more information becomes available.   Albertus Alt MSW, LCSWA 02/24/2024  9:41PM

## 2024-02-24 NOTE — Progress Notes (Signed)
 Pt has been accepted to [facility] on 02/24/2024. Bed assignment: 2 West   Pt meets inpatient criteria per Lady Pier, NP   Attending Physician will be Pryor Browning, MD   Report can be called to: [phone]  Pt can arrive after 8AM  Care Team Notified: Lady Pier, NP, Zelia Hickory, RN,
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
## 2024-02-24 NOTE — ED Triage Notes (Signed)
 Pt states he is having voiced that's telling him to kill himself. Denies homicidal thoughts. Denies etoh and drugs

## 2024-02-25 NOTE — ED Notes (Signed)
 [facility] transport called and set up for 6/7 am. Information given to operator and they are to call when they are on their way. Pt is agreeable to go to [facility].  Can arrive to [name] after 0800 6/7 (see notes in chart)

## 2024-03-12 ENCOUNTER — Encounter (HOSPITAL_COMMUNITY): Payer: Self-pay

## 2024-03-12 ENCOUNTER — Other Ambulatory Visit: Payer: Self-pay

## 2024-03-12 ENCOUNTER — Emergency Department (HOSPITAL_COMMUNITY)
Admission: EM | Admit: 2024-03-12 | Discharge: 2024-03-12 | Disposition: A | Discharge status: Home / Self Care | Payer: MEDICAID | Attending: Emergency Medicine | Admitting: Emergency Medicine

## 2024-03-12 DIAGNOSIS — R45851 Suicidal ideations: Secondary | ICD-10-CM | POA: Diagnosis not present

## 2024-03-12 DIAGNOSIS — F1994 Other psychoactive substance use, unspecified with psychoactive substance-induced mood disorder: Secondary | ICD-10-CM | POA: Diagnosis not present

## 2024-03-12 DIAGNOSIS — R440 Auditory hallucinations: Secondary | ICD-10-CM | POA: Diagnosis present

## 2024-03-12 DIAGNOSIS — F319 Bipolar disorder, unspecified: Secondary | ICD-10-CM | POA: Insufficient documentation

## 2024-03-12 DIAGNOSIS — E876 Hypokalemia: Secondary | ICD-10-CM | POA: Insufficient documentation

## 2024-03-12 LAB — COMPREHENSIVE METABOLIC PANEL WITH GFR
ALT: 17 U/L (ref 0–44)
AST: 21 U/L (ref 15–41)
Albumin: 4.2 g/dL (ref 3.5–5.0)
Alkaline Phosphatase: 44 U/L (ref 38–126)
Anion gap: 9 (ref 5–15)
BUN: 25 mg/dL — ABNORMAL HIGH (ref 6–20)
CO2: 25 mmol/L (ref 22–32)
Calcium: 8.9 mg/dL (ref 8.9–10.3)
Chloride: 105 mmol/L (ref 98–111)
Creatinine, Ser: 0.87 mg/dL (ref 0.61–1.24)
GFR, Estimated: >60 mL/min (ref >60)
Glucose, Bld: 105 mg/dL — ABNORMAL HIGH (ref 70–99)
Potassium: 3.2 mmol/L — ABNORMAL LOW (ref 3.5–5.1)
Sodium: 139 mmol/L (ref 135–145)
Total Bilirubin: 1.2 mg/dL (ref 0.0–1.2)
Total Protein: 7.3 g/dL (ref 6.5–8.1)

## 2024-03-12 LAB — RAPID URINE DRUG SCREEN, HOSP PERFORMED
Amphetamines: POSITIVE — AB
Barbiturates: NOT DETECTED
Benzodiazepines: NOT DETECTED
Cocaine: NOT DETECTED
Opiates: NOT DETECTED
Tetrahydrocannabinol: POSITIVE — AB

## 2024-03-12 LAB — CBC
HCT: 38 % — ABNORMAL LOW (ref 39.0–52.0)
Hemoglobin: 12.6 g/dL — ABNORMAL LOW (ref 13.0–17.0)
MCH: 29.2 pg (ref 26.0–34.0)
MCHC: 33.2 g/dL (ref 30.0–36.0)
MCV: 88.2 fL (ref 80.0–100.0)
Platelets: 249 10*3/uL (ref 150–400)
RBC: 4.31 MIL/uL (ref 4.22–5.81)
RDW: 14.6 % (ref 11.5–15.5)
WBC: 7.2 10*3/uL (ref 4.0–10.5)
nRBC: 0 % (ref 0.0–0.2)

## 2024-03-12 LAB — ETHANOL: Alcohol, Ethyl (B): <15 mg/dL (ref <15)

## 2024-03-12 MED ORDER — AMLODIPINE BESYLATE 5 MG PO TABS
10.0000 mg | ORAL_TABLET | Freq: Every day | ORAL | Status: DC
  Administered 2024-03-12: 10 mg via ORAL
  Filled 2024-03-12: qty 2

## 2024-03-12 MED ORDER — OLANZAPINE 5 MG PO TBDP
5.0000 mg | ORAL_TABLET | Freq: Once | ORAL | Status: AC
  Administered 2024-03-12: 5 mg via ORAL
  Filled 2024-03-12: qty 1

## 2024-03-12 MED ORDER — GABAPENTIN 300 MG PO CAPS
300.0000 mg | ORAL_CAPSULE | Freq: Two times a day (BID) | ORAL | Status: DC
  Administered 2024-03-12: 300 mg via ORAL
  Filled 2024-03-12: qty 1

## 2024-03-12 MED ORDER — GABAPENTIN 300 MG PO CAPS: 300.0000 mg | ORAL_CAPSULE | Freq: Two times a day (BID) | ORAL | 0 refills | Status: DC

## 2024-03-12 MED ORDER — OLANZAPINE 10 MG PO TABS: 10.0000 mg | ORAL_TABLET | Freq: Every day | ORAL | 0 refills | Status: DC

## 2024-03-12 MED ORDER — ESCITALOPRAM OXALATE 10 MG PO TABS
10.0000 mg | ORAL_TABLET | Freq: Every day | ORAL | Status: DC
Start: 2024-03-12 — End: 2024-03-13
  Administered 2024-03-12: 10 mg via ORAL
  Filled 2024-03-12: qty 1

## 2024-03-12 MED ORDER — ESCITALOPRAM OXALATE 10 MG PO TABS: 10.0000 mg | ORAL_TABLET | Freq: Every day | ORAL | 0 refills | Status: DC

## 2024-03-12 MED ORDER — POTASSIUM CHLORIDE CRYS ER 20 MEQ PO TBCR: 40.0000 meq | EXTENDED_RELEASE_TABLET | Freq: Once | ORAL | Status: DC

## 2024-03-12 MED ORDER — AMLODIPINE BESYLATE 10 MG PO TABS: 10.0000 mg | ORAL_TABLET | Freq: Every day | ORAL | 0 refills | Status: DC

## 2024-03-12 NOTE — ED Notes (Signed)
 Pt belongings placed in T[facility] locker #36 d/t the corresponding locker of his room not latching properly.

## 2024-03-12 NOTE — ED Notes (Signed)
 Pt has been dressed out, wanded, and placed in hall A. Pts belongings has been collected and placed in 1-8s pt cabinet. Pt has 2 belongings bags that has been labeled and they contain [employer] sneakers, a pair of jean pants, a tshirt, socks, one black belt and a cell phone.
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
## 2024-03-12 NOTE — ED Notes (Signed)
 Pt requesting coffee and something to eat.

## 2024-03-12 NOTE — ED Notes (Signed)
 Patient to room 35. Patient ambulated to room.  Patient oriented to unit and room

## 2024-03-12 NOTE — ED Notes (Signed)
 Pt sleeping.

## 2024-03-12 NOTE — ED Notes (Signed)
 Pt seems a lot calmer , pt laying in bed trying to rest while watching tv.

## 2024-03-12 NOTE — ED Triage Notes (Signed)
 Pt was D/c from [facility] on Thursday, he is coming in today due to hearing voices in his head and feeling suicidal. He does admit to using meth and marijuana prior to seeking help this morning. He said that his plan is to take fentanyl  or suicide by cop. He is pleasant in triage at this moment and very cooperative. He has not been taking any of his psych medication due to inability to get them

## 2024-03-12 NOTE — ED Notes (Signed)
 [facility] provided pt with resource in formation for [employer], free meal programs, and general [employer].   Chesley Holt, [facility]  03/12/24

## 2024-03-12 NOTE — ED Notes (Signed)
 [facility] made a referral for pt to [facility] ACT Team. Pt will be sent paperwork to complete and return. [facility] will follow up with pt to set up an appointment.   Chesley Holt, [facility]   03/12/24

## 2024-03-12 NOTE — Discharge Instructions (Addendum)
 Trevor [name]

## 2024-03-12 NOTE — ED Notes (Signed)
 Pt just made it back to [facility] , pt seems a little aggravated, RN Jenetta talked with pt. Pt now sitting on side of bed with head down.
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
## 2024-03-15 ENCOUNTER — Encounter (HOSPITAL_COMMUNITY): Payer: Self-pay

## 2024-03-15 ENCOUNTER — Emergency Department (HOSPITAL_COMMUNITY)
Admission: EM | Admit: 2024-03-15 | Discharge: 2024-03-16 | Disposition: A | Discharge status: Home / Self Care | Payer: MEDICAID | Attending: Emergency Medicine | Admitting: Emergency Medicine

## 2024-03-15 ENCOUNTER — Other Ambulatory Visit: Payer: Self-pay

## 2024-03-15 DIAGNOSIS — Z87891 Personal history of nicotine dependence: Secondary | ICD-10-CM | POA: Diagnosis not present

## 2024-03-15 DIAGNOSIS — F1994 Other psychoactive substance use, unspecified with psychoactive substance-induced mood disorder: Secondary | ICD-10-CM

## 2024-03-15 DIAGNOSIS — F3130 Bipolar disorder, current episode depressed, mild or moderate severity, unspecified: Secondary | ICD-10-CM | POA: Insufficient documentation

## 2024-03-15 DIAGNOSIS — R4689 Other symptoms and signs involving appearance and behavior: Secondary | ICD-10-CM | POA: Diagnosis present

## 2024-03-15 DIAGNOSIS — F1520 Other stimulant dependence, uncomplicated: Secondary | ICD-10-CM | POA: Insufficient documentation

## 2024-03-15 DIAGNOSIS — F1220 Cannabis dependence, uncomplicated: Secondary | ICD-10-CM | POA: Insufficient documentation

## 2024-03-15 DIAGNOSIS — F419 Anxiety disorder, unspecified: Secondary | ICD-10-CM

## 2024-03-15 DIAGNOSIS — Z5900 Homelessness unspecified: Secondary | ICD-10-CM | POA: Diagnosis not present

## 2024-03-15 DIAGNOSIS — R45851 Suicidal ideations: Secondary | ICD-10-CM | POA: Insufficient documentation

## 2024-03-15 DIAGNOSIS — F1510 Other stimulant abuse, uncomplicated: Secondary | ICD-10-CM

## 2024-03-15 LAB — RAPID URINE DRUG SCREEN, HOSP PERFORMED
Amphetamines: POSITIVE — AB
Barbiturates: NOT DETECTED
Benzodiazepines: NOT DETECTED
Cocaine: NOT DETECTED
Opiates: NOT DETECTED
Tetrahydrocannabinol: POSITIVE — AB

## 2024-03-15 LAB — COMPREHENSIVE METABOLIC PANEL WITH GFR
ALT: 17 U/L (ref 0–44)
AST: 19 U/L (ref 15–41)
Albumin: 3.9 g/dL (ref 3.5–5.0)
Alkaline Phosphatase: 45 U/L (ref 38–126)
Anion gap: 10 (ref 5–15)
BUN: 20 mg/dL (ref 6–20)
CO2: 24 mmol/L (ref 22–32)
Calcium: 9.2 mg/dL (ref 8.9–10.3)
Chloride: 104 mmol/L (ref 98–111)
Creatinine, Ser: 0.86 mg/dL (ref 0.61–1.24)
GFR, Estimated: >60 mL/min (ref >60)
Glucose, Bld: 83 mg/dL (ref 70–99)
Potassium: 3.7 mmol/L (ref 3.5–5.1)
Sodium: 138 mmol/L (ref 135–145)
Total Bilirubin: 0.8 mg/dL (ref 0.0–1.2)
Total Protein: 6.7 g/dL (ref 6.5–8.1)

## 2024-03-15 LAB — CBC WITH DIFFERENTIAL/PLATELET
Abs Immature Granulocytes: 0.02 10*3/uL (ref 0.00–0.07)
Basophils Absolute: 0 10*3/uL (ref 0.0–0.1)
Basophils Relative: 1 %
Eosinophils Absolute: 0.1 10*3/uL (ref 0.0–0.5)
Eosinophils Relative: 1 %
HCT: 39.9 % (ref 39.0–52.0)
Hemoglobin: 13.5 g/dL (ref 13.0–17.0)
Immature Granulocytes: 0 %
Lymphocytes Relative: 32 %
Lymphs Abs: 2.1 10*3/uL (ref 0.7–4.0)
MCH: 29.4 pg (ref 26.0–34.0)
MCHC: 33.8 g/dL (ref 30.0–36.0)
MCV: 86.9 fL (ref 80.0–100.0)
Monocytes Absolute: 0.4 10*3/uL (ref 0.1–1.0)
Monocytes Relative: 6 %
Neutro Abs: 4 10*3/uL (ref 1.7–7.7)
Neutrophils Relative %: 60 %
Platelets: 281 10*3/uL (ref 150–400)
RBC: 4.59 MIL/uL (ref 4.22–5.81)
RDW: 13.9 % (ref 11.5–15.5)
WBC: 6.7 10*3/uL (ref 4.0–10.5)
nRBC: 0 % (ref 0.0–0.2)

## 2024-03-15 LAB — ACETAMINOPHEN LEVEL: Acetaminophen (Tylenol), Serum: <10 ug/mL — ABNORMAL LOW (ref 10–30)

## 2024-03-15 LAB — ETHANOL: Alcohol, Ethyl (B): <15 mg/dL (ref <15)

## 2024-03-15 LAB — SALICYLATE LEVEL: Salicylate Lvl: <7 mg/dL — ABNORMAL LOW (ref 7.0–30.0)
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
## 2024-03-15 NOTE — ED Triage Notes (Addendum)
 Pt states he is suicidal with a plan. Denies HI. Pt states he wants to OD on fentanyl . Pt states he is hearing voices.

## 2024-03-15 NOTE — BH Assessment (Addendum)
 Clinician messaged Butler RAMAN. Okey, RN, [place], [place] and Floyd, [place]: [place]. It's Trey with TTS. Is the pt able to engage in the assessment, if so the pt will need to be placed in a private room. Is the pt under IVC? Also is the pt medically cleared?    Clinician awaiting response.    Jackson JONETTA Broach, MS, [facility], [facility] Triage Specialist [phone]

## 2024-03-15 NOTE — ED Notes (Signed)
Belongings placed in locker number 3 

## 2024-03-16 MED ORDER — OLANZAPINE 10 MG PO TABS: 10.0000 mg | ORAL_TABLET | Freq: Every day | ORAL | 0 refills | Status: DC

## 2024-03-16 MED ORDER — GABAPENTIN 300 MG PO CAPS
300.0000 mg | ORAL_CAPSULE | Freq: Once | ORAL | Status: AC
  Administered 2024-03-16: 300 mg via ORAL
  Filled 2024-03-16: qty 1

## 2024-03-16 MED ORDER — OLANZAPINE 10 MG PO TABS
10.0000 mg | ORAL_TABLET | Freq: Once | ORAL | Status: AC
  Administered 2024-03-16: 10 mg via ORAL
  Filled 2024-03-16: qty 1

## 2024-03-16 MED ORDER — ESCITALOPRAM OXALATE 10 MG PO TABS: 10.0000 mg | ORAL_TABLET | Freq: Every day | ORAL | 0 refills | Status: DC

## 2024-03-16 MED ORDER — ESCITALOPRAM OXALATE 10 MG PO TABS
10.0000 mg | ORAL_TABLET | Freq: Once | ORAL | Status: AC
  Administered 2024-03-16: 10 mg via ORAL
  Filled 2024-03-16: qty 1

## 2024-03-16 MED ORDER — HYDROXYZINE HCL 25 MG PO TABS
25.0000 mg | ORAL_TABLET | Freq: Once | ORAL | Status: AC
  Administered 2024-03-16: 25 mg via ORAL
  Filled 2024-03-16: qty 1

## 2024-03-16 NOTE — ED Provider Notes (Signed)
 Emergency Medicine Observation Re-evaluation Note  Trevor [name] is a 43 y.o. male, seen on rounds today.  Pt initially presented to the ED for complaints of ongoing substance use disorder, and indicates after recent discharge from behavioral health facility he did not get his home meds. This AM is feeling improved, but is asking for food/drink and for his home meds.   Physical Exam  BP 123/64 (BP Location: Right Arm)   Pulse 70   Temp 98.9 F (37.2 C) (Oral)   Resp 19   Ht 1.803 m (5' 11)   Wt 74.8 kg   SpO2 100%   BMI 23.01 kg/m  Physical Exam General: calm. Conversant. Indicates feels mildly anxious.  Cardiac: regular rate.  Lungs: breathing comfortably. Psych: normal mood and affect. Pt does not appear acutely depressed or despondent. Normal appetite. Pt voices no thoughts, plan or desire to harm self or others. Pt is not responding to internal stimuli - no delusions or hallucinations noted. No acute psychosis noted.   ED Course / MDM    I have reviewed the labs performed to date as well as medications administered while in observation.  Recent changes in the last 24 hours include ED obs, reassessment.   Plan  Recent d/c summary from May reviewed - will give dose of his meds. [facility] and requests [facility] meds be sent there - rx provided, and reinforced need to f/u pcp and bh for further meds/refills. Also encourage to pursue sobriety and seek tx for SUD - resources provided. Will also provide additional pcp and bh resources in the community.   Pt currently appears stable for ED d/c.   Return precautions provided.     Bernard Drivers, MD 03/16/24 [phone]

## 2024-03-16 NOTE — ED Notes (Signed)
 Patient is mad that he is being discharged, D/c instructions given to patient and explained the multiple resources that were given to him. Patient threw his discharge papers in the floor

## 2024-03-16 NOTE — Discharge Instructions (Addendum)
 It was our pleasure to provide your ER care today - we hope that you feel better.  Take your meds as prescribed (we sent a one month supply to your pharmacy - you will need to f/u closely with primary care doctor/primary behavioral health provider to ensure subsequent future refills of your medication).  Avoid any drug use as it is harmful to your physical health and mental well-being. See resource guide attached in terms of accessing inpatient or outpatient substance use treatment programs.   Follow up closely with primary care doctor,behavioral health provider, and community ACT team in the coming week - see attached resources in terms of accessing primary care, behavioral health, and substance use follow up.   For mental health issues and/or crisis, you may also go directly to the [facility] - they are open 24/7 and walk-ins are welcome.    Return to ER if worse, new symptoms, fevers, chest pain, trouble breathing, or other emergency concern.
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
## 2024-05-04 ENCOUNTER — Encounter (HOSPITAL_COMMUNITY): Payer: Self-pay | Admitting: Emergency Medicine

## 2024-05-04 ENCOUNTER — Emergency Department (HOSPITAL_COMMUNITY)
Admission: EM | Admit: 2024-05-04 | Discharge: 2024-05-06 | Disposition: A | Discharge status: Other Acute Inpatient Hospital | Payer: MEDICAID | Attending: Emergency Medicine | Admitting: Emergency Medicine

## 2024-05-04 ENCOUNTER — Other Ambulatory Visit: Payer: Self-pay

## 2024-05-04 DIAGNOSIS — F1520 Other stimulant dependence, uncomplicated: Secondary | ICD-10-CM | POA: Diagnosis not present

## 2024-05-04 DIAGNOSIS — R45851 Suicidal ideations: Secondary | ICD-10-CM | POA: Insufficient documentation

## 2024-05-04 DIAGNOSIS — F29 Unspecified psychosis not due to a substance or known physiological condition: Secondary | ICD-10-CM | POA: Diagnosis not present

## 2024-05-04 DIAGNOSIS — E876 Hypokalemia: Secondary | ICD-10-CM | POA: Diagnosis not present

## 2024-05-04 DIAGNOSIS — F1220 Cannabis dependence, uncomplicated: Secondary | ICD-10-CM | POA: Insufficient documentation

## 2024-05-04 DIAGNOSIS — Z79899 Other long term (current) drug therapy: Secondary | ICD-10-CM | POA: Insufficient documentation

## 2024-05-04 DIAGNOSIS — R451 Restlessness and agitation: Secondary | ICD-10-CM | POA: Diagnosis present

## 2024-05-04 DIAGNOSIS — F3130 Bipolar disorder, current episode depressed, mild or moderate severity, unspecified: Secondary | ICD-10-CM | POA: Insufficient documentation

## 2024-05-04 NOTE — ED Triage Notes (Signed)
 Patient report SI with no plans. Patient report not taking his medication at home. Patient denies hearing voices. Patient denies HI.

## 2024-05-05 LAB — COMPREHENSIVE METABOLIC PANEL WITH GFR
ALT: 17 U/L (ref 0–44)
AST: 26 U/L (ref 15–41)
Albumin: 4.1 g/dL (ref 3.5–5.0)
Alkaline Phosphatase: 58 U/L (ref 38–126)
Anion gap: 11 (ref 5–15)
BUN: 25 mg/dL — ABNORMAL HIGH (ref 6–20)
CO2: 22 mmol/L (ref 22–32)
Calcium: 9.2 mg/dL (ref 8.9–10.3)
Chloride: 104 mmol/L (ref 98–111)
Creatinine, Ser: 1.24 mg/dL (ref 0.61–1.24)
GFR, Estimated: >60 mL/min (ref >60)
Glucose, Bld: 127 mg/dL — ABNORMAL HIGH (ref 70–99)
Potassium: 2.8 mmol/L — ABNORMAL LOW (ref 3.5–5.1)
Sodium: 137 mmol/L (ref 135–145)
Total Bilirubin: 1.5 mg/dL — ABNORMAL HIGH (ref 0.0–1.2)
Total Protein: 7.7 g/dL (ref 6.5–8.1)

## 2024-05-05 LAB — RAPID URINE DRUG SCREEN, HOSP PERFORMED
Amphetamines: POSITIVE — AB
Barbiturates: NOT DETECTED
Benzodiazepines: POSITIVE — AB
Cocaine: NOT DETECTED
Opiates: NOT DETECTED
Tetrahydrocannabinol: POSITIVE — AB

## 2024-05-05 LAB — CBC
HCT: 40.3 % (ref 39.0–52.0)
Hemoglobin: 13.2 g/dL (ref 13.0–17.0)
MCH: 29.5 pg (ref 26.0–34.0)
MCHC: 32.8 g/dL (ref 30.0–36.0)
MCV: 90 fL (ref 80.0–100.0)
Platelets: 257 K/uL (ref 150–400)
RBC: 4.48 MIL/uL (ref 4.22–5.81)
RDW: 13.4 % (ref 11.5–15.5)
WBC: 5.9 K/uL (ref 4.0–10.5)
nRBC: 0 % (ref 0.0–0.2)

## 2024-05-05 LAB — ETHANOL: Alcohol, Ethyl (B): <15 mg/dL (ref <15)

## 2024-05-05 MED ORDER — LORAZEPAM 0.5 MG PO TABS
0.5000 mg | ORAL_TABLET | Freq: Three times a day (TID) | ORAL | Status: DC | PRN
  Administered 2024-05-05: 0.5 mg via ORAL
  Filled 2024-05-05: qty 1

## 2024-05-05 MED ORDER — FLUOXETINE HCL 10 MG PO CAPS
10.0000 mg | ORAL_CAPSULE | Freq: Every day | ORAL | Status: DC
  Filled 2024-05-05: qty 1

## 2024-05-05 MED ORDER — ONDANSETRON HCL 4 MG PO TABS: 4.0000 mg | ORAL_TABLET | Freq: Three times a day (TID) | ORAL | Status: DC | PRN

## 2024-05-05 MED ORDER — NICOTINE 7 MG/24HR TD PT24
7.0000 mg | MEDICATED_PATCH | Freq: Every day | TRANSDERMAL | Status: DC
  Administered 2024-05-05: 7 mg via TRANSDERMAL
  Filled 2024-05-05: qty 1

## 2024-05-05 MED ORDER — IBUPROFEN 200 MG PO TABS: 600.0000 mg | ORAL_TABLET | Freq: Three times a day (TID) | ORAL | Status: DC | PRN

## 2024-05-05 MED ORDER — ALUM & MAG HYDROXIDE-SIMETH 200-200-20 MG/5ML PO SUSP: 30.0000 mL | Freq: Four times a day (QID) | ORAL | Status: DC | PRN

## 2024-05-05 MED ORDER — ZIPRASIDONE MESYLATE 20 MG IM SOLR
20.0000 mg | Freq: Once | INTRAMUSCULAR | Status: AC
  Administered 2024-05-05: 20 mg via INTRAMUSCULAR

## 2024-05-05 MED ORDER — LORAZEPAM 1 MG PO TABS
1.0000 mg | ORAL_TABLET | Freq: Once | ORAL | Status: AC
  Administered 2024-05-05: 1 mg via ORAL
  Filled 2024-05-05: qty 1

## 2024-05-05 MED ORDER — OLANZAPINE 10 MG PO TABS
10.0000 mg | ORAL_TABLET | Freq: Every day | ORAL | Status: DC
  Administered 2024-05-05: 10 mg via ORAL
  Filled 2024-05-05: qty 1

## 2024-05-05 MED ORDER — POTASSIUM CHLORIDE CRYS ER 20 MEQ PO TBCR
40.0000 meq | EXTENDED_RELEASE_TABLET | Freq: Two times a day (BID) | ORAL | Status: AC
  Administered 2024-05-05: 40 meq via ORAL
  Filled 2024-05-05: qty 2

## 2024-05-05 NOTE — ED Notes (Signed)
 Pt is currently having conversations with himself and cursing

## 2024-05-05 NOTE — ED Notes (Signed)
 Pt in hallway yelling demanding his belongings ready to go refusing to go back in his room

## 2024-05-05 NOTE — ED Notes (Signed)
 Patient taken off restraints.

## 2024-05-05 NOTE — Progress Notes (Signed)
 Patient has been denied by [facility] due to no appropriate beds available. Patient meets BH inpatient criteria per Alan Mcardle, NP. Patient has been faxed out to the following facilities:    [facility]  [address] [place] [place] [address] [phone]  [facility]  [address], [place] [address] [phone]  [facility]  [address] [place], [place] [address] [phone]  [facility] Patient Placement  [facility], [place] [phone]  [facility]  [address] [place] [address] [phone]  [facility]  [address] [place] [address] [phone]  [facility] EFAX  [address] [place], [place] [phone]  [facility] Center-Adult  [address] [place] [address] [phone]  [facility]  [address] [place] [address] [phone]  [facility]  [address] [place] [address] [phone]  [facility]  [address] [place], [place] [address] [phone]  [facility]  [address] [place] [address] [phone]  [facility]  [address] [place] [address] [phone]  [facility]  [address] [place] [address] [phone]  [facility]  [address], [place] [address] [phone]  [facility]  [address] [place] [address] [phone]  [facility]  [address] [place] [address]  [phone]  [facility] Psychiatry Inpatient [place]  [place] [phone]    Bunnie Gallop, MSW, LCSW-A  10:38 AM 05/05/2024

## 2024-05-05 NOTE — ED Notes (Signed)
 Report given to Mikle Fleischer RN at [facility]
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
## 2024-05-05 NOTE — ED Notes (Signed)
 Patient given a sausage biscuit.

## 2024-05-05 NOTE — ED Provider Notes (Signed)
 Emergency Medicine Observation Re-evaluation Note  Lemon Sternberg is a 43 y.o. male, seen on rounds today.  Pt initially presented to the ED for complaints of Suicidal Currently, the patient is calm. Required restraints for aggressive behavior. Making homicidal threats overnight.   Physical Exam  BP 116/77   Pulse 89   Temp 97.7 F (36.5 C)   Resp 18   Ht 5' 11 (1.803 m)   Wt 75 kg   SpO2 96%   BMI 23.06 kg/m  Physical Exam General: NAD Lungs: No respiratory distress Psych: Calm  ED Course / MDM  EKG:   I have reviewed the labs performed to date as well as medications administered while in observation.  Recent changes in the last 24 hours include on IVC hold, psychiatry recommended inpatient.   Plan  Current plan is for placement.     Gennaro Duwaine CROME, DO 05/05/24 [phone]

## 2024-05-05 NOTE — ED Notes (Signed)
 Pt constantly yelling and cursing thinking someone is talking about him.

## 2024-05-05 NOTE — BH Assessment (Signed)
 Comprehensive Clinical Assessment (CCA) Note  05/05/2024 Trevor [name] [id number]  Disposition: Trevor Olp, NP recommends inpatient treatment. CSW to seek placement. Disposition discussed with Dr. Raynell [name] and Trevor [name], EMT via secure message.   The patient demonstrates the following risk factors for suicide: Chronic risk factors for suicide include: Bipolar 1 Disorder, depressed (HCC). Acute risk factors for suicide include: Pt reports, passive suicidal ideations. Protective factors for this patient include: None. Considering these factors, the overall suicide risk at this point appears to be moderate. Patient is not appropriate for outpatient follow up.  Trevor [name] is a 43 year old male who presents voluntary and unaccompanied to [facility] Emergency Department. Clinician asked the pt, what brought you to the hospital? Pt reports, mental health is bullshit. Pt reports, she walked to the Emergency Department but is ready to leave. Pt reports, everything is a stressor, he's worried about life. Per chart, pt appeared to be responding to internal stimuli however pt denies. Pt denies, SI, HI, hallucinations, self-injurious behaviors and access to weapons.   Pt reports, he likes to get high and uses Amphetamines and Marijuana daily. Pt's UDS is positive for Amphetamines and Marijuana. Pt denies, being linked to OPT resources (medication management and/or counseling.) Pt reports, previous inpatient admissions.  Pt presents irritable standing in doorway in scrubs with loud pressured speech. Pt's mood was irritable. Pt's affect was congruent with mood. Pt's insight, judgement are poor.   *Pt declined for clinician to contact anyone to obtain additional information.*  Chief Complaint:  Chief Complaint  Patient presents with   Suicidal   Visit Diagnosis: Bipolar 1 Disorder, depressed (HCC).                             Methamphetamine dependence (HCC).                              Cannabis use Disorder, severe.   CCA Screening, Triage and Referral (STR)  Patient Reported Information How did you hear about us ? Self  What Is the Reason for Your Visit/Call Today? Initially, pt presented with suicidal ideations with no plan. Per chart, pt appeared to be responding to internal stimuli. Pt denies, SI, HI, hallucinations, self-injurious behaviors and access to weapons.  How Long Has This Been Causing You Problems? <Week  What Do You Feel Would Help You the Most Today? Alcohol or Drug Use Treatment; Stress Management; Medication(s); Treatment for Depression or other mood problem   Have You Recently Had Any Thoughts About Hurting Yourself? Yes  Are You Planning to Commit Suicide/Harm Yourself At This time? No   Flowsheet Row ED from 05/04/2024 in [facility] Department at [facility] ED from 03[facility]15/2024 in [facility] Emergency Department at [facility] ED from 03/12/2024 in [facility] Emergency Department at [facility]  C-SSRS RISK CATEGORY Moderate Risk High Risk Error: Q7 should not be populated when Q6 is No    Have you Recently Had Thoughts About Hurting Someone [name]? No (Pt reports, some people I don't want in this world.)  Are You Planning to Harm Someone at This Time? No  Explanation: NA   Have You Used Any Alcohol or Drugs in the Past 24 Hours? Yes  How Long Ago Did You Use Drugs or Alcohol? Prior to arrival  What Did You Use and How Much? Pt reports, using Methamphetamines  and smoking Marijuana everyday.   Do You Currently Have a Therapist/Psychiatrist? No  Name of Therapist/Psychiatrist:    Have You Been Recently Discharged From Any Office Practice or Programs? Yes  Explanation of Discharge From Practice/Program: Pt was discharged from [facility] ED for similar presentation on 03/15/2024. Per chart, pt at [facility] from 02/24/2024-03/05/2024.     CCA Screening Triage Referral Assessment Type of  Contact: Tele-Assessment  Telemedicine Service Delivery: Telemedicine service delivery: This service was provided via telemedicine using a 2-way, interactive audio and video technology  Is this Initial or Reassessment? Is this Initial or Reassessment?: Initial Assessment  Date Telepsych consult ordered in [facility]:  Date Telepsych consult ordered in [facility]: 05/05/24  Time Telepsych consult ordered in [facility]:  Time Telepsych consult ordered in [facility]: 0209  Location of Assessment: [facility] ED  Provider Location: [facility] Assessment Services   Collateral Involvement: Pt declined for clinician to contact anyone to obtain additional information.   Does Patient Have a Automotive engineer Guardian? No  Legal Guardian Contact Information: Pt denies.  Copy of Legal Guardianship Form: -- (NA)  Legal Guardian Notified of Arrival: -- (NA)  Legal Guardian Notified of Pending Discharge: -- (NA)  If Minor and Not Living with Parent(s), Who has Custody? NA  Is CPS involved or ever been involved? Never  Is APS involved or ever been involved? Never   Patient Determined To Be At Risk for Harm To Self or Others Based on Review of Patient Reported Information or Presenting Complaint? Yes, for Self-Harm  Method: No Plan  Availability of Means: No access or NA  Intent: Vague intent or NA  Notification Required: No need or identified person  Additional Information for Danger to Others Potential: Active psychosis  Additional Comments for Danger to Others Potential: NA  Are There Guns or Other Weapons in Your Home? Yes  Types of Guns/Weapons: Pt did not disclose.  Are These Weapons Safely Secured?                            -- (NA)  Who Could Verify You Are Able To Have These Secured: NA  Do You Have any Outstanding Charges, Pending Court Dates, Parole/Probation? Pt reports, he has a gun charge, his court date is 07/19/2024.  Contacted To Inform of Risk of Harm To Self or Others: -- (NA)    Does  Patient Present under Involuntary Commitment? No    [place] of Residence: [place]   Patient Currently Receiving the Following Services: Not Receiving Services   Determination of Need: Emergent (2 hours)   Options For Referral: Medication Management; Inpatient Hospitalization; Facility-Based Crisis     CCA Biopsychosocial Patient Reported Schizophrenia/Schizoaffective Diagnosis in Past: No   Strengths: Pt is seeking help for the voices.   Mental Health Symptoms Depression:  Irritability (Pt denies, depression symptoms.)   Duration of Depressive symptoms: Duration of Depressive Symptoms: N/A   Mania:  None   Anxiety:   Irritability (Pt denies, anxiety symptoms.)   Psychosis:  Hallucinations   Duration of Psychotic symptoms: Duration of Psychotic Symptoms: N/A   Trauma:  -- (UTA)   Obsessions:  None   Compulsions:  None   Inattention:  Forgetful; Loses things; Disorganized   Hyperactivity/Impulsivity:  None; Feeling of restlessness; Fidgets with hands/feet   Oppositional/Defiant Behaviors:  Angry; Argumentative; Temper   Emotional Irregularity:  Chronic feelings of emptiness; Potentially harmful impulsivity   Other Mood/Personality Symptoms:  NA  Mental Status Exam Appearance and self-care  Stature:  Average   Weight:  Thin   Clothing:  -- (In scrubs.)   Grooming:  Normal   Cosmetic use:  None   Posture/gait:  Normal   Motor activity:  Not Remarkable   Sensorium  Attention:  Normal   Concentration:  Normal   Orientation:  X5   Recall/memory:  Normal   Affect and Mood  Affect:  Flat   Mood:  Irritable   Relating  Eye contact:  Normal   Facial expression:  Constricted   Attitude toward examiner:  Irritable; Argumentative   Thought and Language  Speech flow: Loud; Pressured   Thought content:  Appropriate to Mood and Circumstances   Preoccupation:  None   Hallucinations:  Auditory (However pt denies.)   Organization:   Coherent   [employer] of Knowledge:  Average   Intelligence:  Average   Abstraction:  Normal   Judgement:  Poor   Reality Testing:  Distorted   Insight:  Poor   Decision Making:  Impulsive   Social Functioning  Social Maturity:  Isolates; Impulsive; Irresponsible   Social Judgement:  Heedless; Chief of Staff   Stress  Stressors:  Other (Comment) (Pt reports, everything, about life.)   Coping Ability:  Deficient supports; Overwhelmed   Skill Deficits:  Self-control; Responsibility; Decision making; Communication; Self-care   Supports:  Support needed     Religion: Religion/Spirituality Are You A Religious Person?:  (Pt reports, spiritual.) How Might This Affect Treatment?: NA  Leisure/Recreation: Leisure / Recreation Do You Have Hobbies?: Yes Leisure and Hobbies: Pt reports, he likes to get high.  Exercise/Diet: Exercise/Diet Do You Exercise?:  (UTA) What Type of Exercise Do You Do?:  (UTA) How Many Times a Week Do You Exercise?:  (UTA) Have You Gained or Lost A Significant Amount of Weight in the Past Six Months?: No Number of Pounds Lost?:  (UTA) Do You Follow a Special Diet?: No Do You Have Any Trouble Sleeping?: No Explanation of Sleeping Difficulties: Pt denies.   CCA Employment/Education Employment/Work Situation: Employment / Work Situation Employment Situation: Unemployed Patient's Job has Been Impacted by Current Illness: No Has Patient ever Been in Equities trader?: No  Education: Education Is Patient Currently Attending School?: No Last Grade Completed: 12 Did You Product manager?: No Did You Have An Individualized Education Program (IIEP): No Did You Have Any Difficulty At [employer]?: No Patient's Education Has Been Impacted by Current Illness: No   CCA Family/Childhood History Family and Relationship History: Family history Marital status: Single Does patient have children?: No  Childhood History:  Childhood  History By whom was/is the patient raised?: Mother Did patient suffer any verbal/emotional/physical/sexual abuse as a child?: No Did patient suffer from severe childhood neglect?: No Has patient ever been sexually abused/assaulted/raped as an adolescent or adult?: No Was the patient ever a victim of a crime or a disaster?: No Witnessed domestic violence?: No Has patient been affected by domestic violence as an adult?: No Description of domestic violence: Pt denies.   CCA Substance Use Alcohol/Drug Use: Alcohol / Drug Use Pain Medications: See MAR Prescriptions: See MAR Over the Counter: See MAR History of alcohol / drug use?: Yes Longest period of sobriety (when/how long): UTA Negative Consequences of Use: Legal, Personal relationships, Work / Programmer, multimedia, Surveyor, quantity Withdrawal Symptoms: None Substance #1 Name of Substance 1: Amphetamines. 1 - Age of First Use: UTA 1 - Amount (size/oz): UTA 1 - Frequency: Ongoing. 1 - Duration: Ongoing.  1 - Last Use / Amount: Everyday. 1 - Method of Aquiring: UTA 1- Route of Use: UTA Substance #2 Name of Substance 2: Marijuana. 2 - Age of First Use: UTA 2 - Amount (size/oz): UTA 2 - Frequency: Everyday 2 - Duration: Ongoing. 2 - Last Use / Amount: Ongoing. 2 - Method of Aquiring: UTA 2 - Route of Substance Use: UTA    ASAM's:  Six Dimensions of Multidimensional Assessment  Dimension 1:  Acute Intoxication and/or Withdrawal Potential:   Dimension 1:  Description of individual's past and current experiences of substance use and withdrawal: None.  Dimension 2:  Biomedical Conditions and Complications:   Dimension 2:  Description of patient's biomedical conditions and  complications: None.  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 Pt has a history of inpatient treatment.  Dimension 4:  Readiness to Change:  Dimension 4:  Description of Readiness to Change criteria: Pt wants to be discharged. Pt wants to be discharged however pt reports,  he walked to the ED voluntarily.  Dimension 5:  Relapse, Continued use, or Continued Problem Potential:  Dimension 5:  Relapse, continued use, or continued problem potential critiera description: Pt has ongoing substance use.  Dimension 6:  Recovery/Living Environment:  Dimension 6:  Recovery/Iiving environment criteria description: Unsure. Pt did not disclose much during the assessment.  ASAM Severity Score: ASAM's Severity Rating Score: 10  ASAM Recommended Level of Treatment: ASAM Recommended Level of Treatment: Level II Intensive Outpatient Treatment   Substance use Disorder (SUD) Substance Use Disorder (SUD)  Checklist Symptoms of Substance Use: Continued use despite having a persistent/recurrent physical/psychological problem caused/exacerbated by use, Evidence of tolerance, Continued use despite persistent or recurrent social, interpersonal problems, caused or exacerbated by use, Recurrent use that results in a failure to fulfill major role obligations (work, school, home)  Recommendations for Services/Supports/Treatments: Recommendations for Services/Supports/Treatments Recommendations For Services/Supports/Treatments: Other (Comment), Inpatient Hospitalization (Pt to be observed and reassessed by psychiatry.)  Disposition Recommendation per psychiatric provider: We recommend inpatient psychiatric hospitalization when medically cleared. Patient is under voluntary admission status at this time; please IVC if attempts to leave hospital.   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, 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/06/2024   Bipolar 1 disorder, depressed (HCC) 01/30/2024   Methamphetamine dependence (HCC) 10/21/2023   History of intravenous drug abuse 10/19/2023   Osteomyelitis of left arm (HCC) 10/19/2023   Septic olecranon bursitis of right elbow 10/19/2023   Hypokalemia 10/14/2023   Normocytic anemia 10/14/2023   Polysubstance abuse (HCC) 10/14/2023   Cellulitis of right elbow 10/13/2023   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     Referrals to Alternative Service(s): Referred to Alternative Service(s):   Place:   Date:   Time:    Referred to Alternative  Service(s):   Place:   Date:   Time:    Referred to Alternative Service(s):   Place:   Date:   Time:    Referred to Alternative Service(s):   Place:   Date:   Time:     Jackson JONETTA Broach, [facility] Comprehensive Clinical Assessment (CCA) Screening, Triage and Referral Note  05/05/2024 Suhaan Perleberg [id number]  Chief Complaint:  Chief Complaint  Patient presents with   Suicidal   Visit Diagnosis:   Patient Reported Information How did you hear about us ? Self  What Is the Reason for Your Visit/Call Today? Initially, pt presented with suicidal ideations with no plan. Per chart, pt appeared to be responding to internal stimuli. Pt denies, SI, HI, hallucinations, self-injurious behaviors and access to weapons.  How Long Has This Been Causing You Problems? <Week  What Do You Feel Would Help You the  Most Today? Alcohol or Drug Use Treatment; Stress Management; Medication(s); Treatment for Depression or other mood problem   Have You Recently Had Any Thoughts About Hurting Yourself? Yes  Are You Planning to Commit Suicide/Harm Yourself At This time? No   Have you Recently Had Thoughts About Hurting Someone [name]? No (Pt reports, some people I don't want in this world.)  Are You Planning to Harm Someone at This Time? No  Explanation: NA   Have You Used Any Alcohol or Drugs in the Past 24 Hours? Yes  How Long Ago Did You Use Drugs or Alcohol? Prior to arrival  What Did You Use and How Much? Pt reports, using Methamphetamines and smoking Marijuana everyday.   Do You Currently Have a Therapist/Psychiatrist? No  Name of Therapist/Psychiatrist: None.   Have You Been Recently Discharged From Any Office Practice or Programs? Yes  Explanation of Discharge From Practice/Program: Pt was discharged from [facility] ED for similar presentation on 03/15/2024. Per chart, pt at [facility] from 02/24/2024-03/05/2024.    CCA Screening Triage Referral Assessment Type of Contact: Tele-Assessment  Telemedicine Service Delivery: Telemedicine service delivery: This service was provided via telemedicine using a 2-way, interactive audio and video technology  Is this Initial or Reassessment? Is this Initial or Reassessment?: Initial Assessment  Date Telepsych consult ordered in [facility]:  Date Telepsych consult ordered in [facility]: 05/05/24  Time Telepsych consult ordered in [facility]:  Time Telepsych consult ordered in [facility]: 0209  Location of Assessment: [facility]  Provider Location: [facility] Assessment Services    Collateral Involvement: Pt declined for clinician to contact anyone to obtain additional information.   Does Patient Have a Automotive engineer Guardian? No. Name and Contact of Legal Guardian: No. If Minor and Not Living with Parent(s), Who has Custody? NA  Is CPS involved or ever been  involved? Never  Is APS involved or ever been involved? Never   Patient Determined To Be At Risk for Harm To Self or Others Based on Review of Patient Reported Information or Presenting Complaint? Yes, for Self-Harm  Method: No Plan  Availability of Means: No access or NA  Intent: Vague intent or NA  Notification Required: No need or identified person  Additional Information for Danger to Others Potential: Active psychosis  Additional Comments for Danger to Others Potential: NA  Are There Guns or Other Weapons in Your Home? Yes  Types of Guns/Weapons: Pt did not disclose.  Are These Weapons Safely Secured?                            -- (  NA)  Who Could Verify You Are Able To Have These Secured: NA  Do You Have any Outstanding Charges, Pending Court Dates, Parole/Probation? Pt reports, he has a gun charge, his court date is 07/19/2024.  Contacted To Inform of Risk of Harm To Self or Others: -- (NA)   Does Patient Present under Involuntary Commitment? No    [place] of Residence: [place]   Patient Currently Receiving the Following Services: Not Receiving Services   Determination of Need: Emergent (2 hours)   Options For Referral: Medication Management; Inpatient Hospitalization; Facility-Based Crisis   Disposition Recommendation per psychiatric provider: We recommend inpatient psychiatric hospitalization when medically cleared. Patient is under voluntary admission status at this time; please IVC if attempts to leave hospital.  Jackson JONETTA Broach, [facility]   Jackson JONETTA Broach, MS, [facility], [facility] Triage Specialist [phone]

## 2024-05-05 NOTE — BH Assessment (Signed)
 Clinician messaged Junior Rimando, RN: Hey. It's Trey with TTS. Is the pt able to engage in the assessment, if so the pt will need to be placed in a private room. Is the pt under IVC? Also is the pt medically cleared?   Clinician waiting response.    Jackson JONETTA Broach, MS, [facility], [facility] Triage Specialist [phone]

## 2024-05-05 NOTE — BH Assessment (Signed)
 Clinician called and spoke to [place], [place] at [facility] to see if the TTS cart can be set up to complete the pt's assessment.    Jackson JONETTA Broach, MS, [facility], [facility] Triage Specialist [phone]

## 2024-05-05 NOTE — ED Notes (Signed)
Patient sleeping at this time.   Sitter at bedside.

## 2024-05-05 NOTE — ED Notes (Signed)
 Pt given cup of water

## 2024-05-05 NOTE — Progress Notes (Signed)
 [facility] LCSW Progress Note   05/05/2024    10:57 AM   Trevor [name]    [id number]    Type of Contact and Topic:  Psychiatric Bed Placement    Pt accepted to [facility]       Patient meets inpatient criteria per Roxianne Olp, NP     The attending provider will be Dr. Oneil Charleston   Call report to [phone]   Charolet Cram, RN @ [facility] notified.      Pt scheduled  to arrive at [facility] for today.      Bunnie Gallop, MSW, LCSW-A  10:58 AM 05/05/2024

## 2024-05-05 NOTE — ED Provider Notes (Signed)
 [place] EMERGENCY DEPARTMENT AT [facility] Provider Note   CSN: [id number] Arrival date & time: 05/04/24  2343     Patient presents with: Suicidal   Hogan Hoobler is a 43 y.o. male.   43 year old male presents with report of suicidal ideation without specific plan but states that the opportunity presented itself he would.  Denies prior suicide attempts.  When asked what triggered this episode today he states that open to talk about it.  States that this has been a problem in the past but no problems until today.  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  Also reports marijuana use.  Denies alcohol use.  No other complaints or concerns today.       Prior to Admission medications   Medication Sig Start Date End Date Taking? Authorizing Provider  ALPRAZolam  (XANAX ) 0.5 MG tablet Take 0.5 mg by mouth 3 (three) times daily as needed for anxiety.    [provider]  amLODipine  (NORVASC ) 10 MG tablet Take 1 tablet (10 mg total) by mouth daily. 03/12/24 04/11/24  Neysa Caron PARAS, DO  escitalopram  (LEXAPRO ) 10 MG tablet Take 1 tablet (10 mg total) by mouth daily. 03/16/24   Steinl, Kevin, MD  nicotine  (NICODERM CQ  - DOSED IN MG/24 HOURS) 14 mg/24hr patch Place 1 patch (14 mg total) onto the skin daily. Patient taking differently: Place 14 mg onto the skin daily as needed (Cravings). 02/09/24   Hinda [name], MD  OLANZapine  (ZYPREXA ) 10 MG tablet Take 1 tablet (10 mg total) by mouth at bedtime. 03/16/24   Steinl, Kevin, MD    Allergies: Penicillins and Augmentin [amoxicillin-pot clavulanate]    Review of Systems Negative except as per HPI Updated Vital Signs BP 116/77   Pulse 89   Temp 97.7 F (36.5 C)   Resp 18   Ht 5' 11 (1.803 m)   Wt 75 kg   SpO2 96%   BMI 23.06 kg/m   Physical Exam Vitals and nursing note  reviewed.  Constitutional:      General: He is not in acute distress.    Appearance: He is well-developed. He is not diaphoretic.  HENT:     Head: Normocephalic and atraumatic.  Cardiovascular:     Rate and Rhythm: Normal rate and regular rhythm.     Heart sounds: Normal heart sounds.  Pulmonary:     Effort: Pulmonary effort is normal.     Breath sounds: Normal breath sounds.  Skin:    General: Skin is warm and dry.  Neurological:     Mental Status: He is alert and oriented to person, place, and time.  Psychiatric:        Behavior: Behavior is agitated. Behavior is not withdrawn.        Thought Content: Thought content includes suicidal ideation. Thought content does not include suicidal plan.     (all labs ordered are listed, but only abnormal results are displayed) Labs Reviewed  COMPREHENSIVE METABOLIC PANEL WITH GFR - Abnormal; Notable for the following components:      Result Value   Potassium 2.8 (*)    Glucose, Bld 127 (*)    BUN 25 (*)    Total Bilirubin 1.5 (*)    All other components within normal limits  ETHANOL  CBC  RAPID URINE DRUG SCREEN, HOSP PERFORMED    EKG: None  Radiology: No results found.   .Critical Care  Performed by: Beverley Leita LABOR, PA-C Authorized by: Beverley Leita LABOR, PA-C   Critical care provider statement:    Critical care time (minutes):  30   Critical care was time spent personally by me on the following activities:  Development of treatment plan with patient or surrogate, discussions with consultants, evaluation of patient's response to treatment, examination of patient, ordering and review of laboratory studies, ordering and review of radiographic studies, ordering and performing treatments and interventions, pulse oximetry, re-evaluation of patient's condition and review of old charts    Medications Ordered in the ED  ibuprofen  (ADVIL ) tablet 600 mg (has no administration in time range)  ondansetron  (ZOFRAN ) tablet 4 mg (has no  administration in time range)  alum & mag hydroxide-simeth (MAALOX/MYLANTA) 200-200-20 MG/5ML suspension 30 mL (has no administration in time range)  nicotine  (NICODERM CQ  - dosed in mg/24 hr) patch 7 mg (has no administration in time range)  potassium chloride  SA (KLOR-CON  M) CR tablet 40 mEq (has no administration in time range)  ziprasidone  (GEODON ) injection 20 mg (20 mg Intramuscular Given 05/05/24 0419)                                    Medical Decision Making Amount and/or Complexity of Data Reviewed Labs: ordered.  Risk OTC drugs. Prescription drug management.   This patient presents to the ED for concern of suicidal ideation, this involves an extensive number of treatment options, and is a complaint that carries with it a high risk of complications and morbidity.  The differential diagnosis includes psychosis, substance abuse, malingering   Co morbidities / Chronic conditions that complicate the patient evaluation  Polysubstance abuse, hyperlipidemia, narcotic addiction, bipolar disorder, generalized anxiety disorder, malingering   Additional history obtained:  Additional history obtained from EMR External records from outside source obtained and reviewed including labs and imaging on file   Lab Tests:  I Ordered, and personally interpreted labs.  The pertinent results include: CBC without significant findings.  CMP with hypokalemia with potassium of 2.8.  Alcohol negative.   Cardiac Monitoring: / EKG:  The patient was maintained on a cardiac monitor.  I personally viewed and interpreted the cardiac monitored which showed an underlying rhythm of: Sinus rhythm, rate 87   Problem List / ED Course / Critical interventions / Medication management  43 year old male presents with complaint of suicidal ideation without plan.  Does not want to discuss what happened today to triggered this episode.  Reports injecting meth, smokes marijuana, no other drug or alcohol use.   He declines skin exam but states he does not have concerns for infection at this time and does not need a tetanus.  Patient is medically cleared for behavioral health evaluation and disposition. After patient's behavioral health evaluation, he became violent and hostile with staff, wandering the halls and demanding his things threatening staff.  Patient was placed under IVC and provided with Geodon  and restraints for patient and staff safety. Labs obtained after patient was medicated and restrained.  He does have hypokalemia.  This was replaced orally with order for repeat dose later today. I ordered medication including Geodon  Reevaluation of the patient after these medicines showed that the patient was sleeping.  I have reviewed the patients home medicines and have made adjustments as needed   Consultations Obtained:  I requested consultation with the behavioral health team,  and discussed lab and imaging findings as well as pertinent plan - they recommend: Recommend inpatient admission   Social Determinants of Health:  No PCP   Test / Admission - Considered:  Behavioral health admission      Final diagnoses:  Suicidal ideation  Hypokalemia    ED Discharge Orders     None          Beverley Leita DELENA DEVONNA 05/05/24 0618    Trine Raynell Moder, MD 05/05/24 [phone]

## 2024-05-05 NOTE — ED Notes (Signed)
 Pt stated I'm gone kill all yall

## 2024-05-05 NOTE — ED Provider Notes (Signed)
 Patient signed out to myself at time of shift change by Leita Chancy, PA-C, at this time inpatient admission has been recommended by psych due to suicidal ideations.  Currently awaiting placement.   Patient has been approved to go to [facility], just waiting on transfer. Patient has required restraints for aggressive behavior, making homicidal threats to staff overnight. Restraints have now been discontinued with no current issues at time of end of my shift.    Almetta Liddicoat F, PA-C 05/05/24 1501    Kammerer, Megan L, DO 05/07/24 2115

## 2024-05-06 NOTE — ED Notes (Signed)
 Unable to perform Suicide Risk assessment. Pt currently sleeping.

## 2024-05-06 NOTE — ED Provider Notes (Signed)
 Emergency Medicine Observation Re-evaluation Note  Carmon Sahli is a 43 y.o. male, seen on rounds today.  Pt initially presented to the ED for complaints of Suicidal Currently, the patient is resting.  Physical Exam  BP 101/71 (BP Location: Right Arm)   Pulse 76   Temp 98 F (36.7 C) (Axillary)   Resp 14   Ht 1.803 m (5' 11)   Wt 75 kg   SpO2 98%   BMI 23.06 kg/m  Physical Exam General: nad  ED Course / MDM  EKG:   I have reviewed the labs performed to date as well as medications administered while in observation.  Recent changes in the last 24 hours include no events overnight.  Pt is not in restraints.  Plan  Current plan is for inpatient psych. Previously noted to be hypokalemic.  Potassium replacements has been ordered    Randol Simmonds, MD 05/06/24 [phone]

## 2024-05-06 NOTE — ED Notes (Signed)
 [facility] office on the way to pick up pt within the next hour.

## 2024-05-06 NOTE — ED Notes (Signed)
 [facility] LCSW Progress Note   05/05/2024    10:57 AM   Trevor [name]    [id number]    Type of Contact and Topic:  Psychiatric Bed Placement    Pt accepted to [facility]       Patient meets inpatient criteria per Roxianne Olp, NP     The attending provider will be Dr. Oneil Charleston   Call report to [phone]   Charolet Cram, RN @ [facility] notified.      Pt scheduled  to arrive at [facility] for today.      Bunnie Gallop, MSW, LCSW-A  10:58 AM 05/05/2024

## 2024-05-06 NOTE — ED Notes (Signed)
 IVC case # [id number]

## 2024-05-06 NOTE — ED Notes (Signed)
 [facility] de[facility] was called and left message about transport of patient to [facility]    call back number was left as well

## 2024-06-13 ENCOUNTER — Other Ambulatory Visit: Payer: Self-pay

## 2024-06-13 ENCOUNTER — Other Ambulatory Visit (HOSPITAL_COMMUNITY)
Admission: EM | Admit: 2024-06-13 | Discharge: 2024-06-16 | Disposition: A | Discharge status: Home / Self Care | Payer: MEDICAID | Attending: Psychiatry | Admitting: Psychiatry

## 2024-06-13 ENCOUNTER — Ambulatory Visit (HOSPITAL_COMMUNITY)
Admission: EM | Admit: 2024-06-13 | Discharge: 2024-06-13 | Disposition: A | Discharge status: Skilled Nursing Facility | Payer: MEDICAID | Source: Intra-hospital

## 2024-06-13 ENCOUNTER — Emergency Department (HOSPITAL_COMMUNITY)
Admission: EM | Admit: 2024-06-13 | Discharge: 2024-06-13 | Disposition: A | Discharge status: Other Acute Inpatient Hospital | Payer: MEDICAID | Attending: Emergency Medicine | Admitting: Emergency Medicine

## 2024-06-13 DIAGNOSIS — R440 Auditory hallucinations: Secondary | ICD-10-CM | POA: Insufficient documentation

## 2024-06-13 DIAGNOSIS — Z5901 Sheltered homelessness: Secondary | ICD-10-CM | POA: Insufficient documentation

## 2024-06-13 DIAGNOSIS — Y900 Blood alcohol level of less than 20 mg/100 ml: Secondary | ICD-10-CM | POA: Diagnosis not present

## 2024-06-13 DIAGNOSIS — Z5900 Homelessness unspecified: Secondary | ICD-10-CM

## 2024-06-13 DIAGNOSIS — R443 Hallucinations, unspecified: Secondary | ICD-10-CM

## 2024-06-13 DIAGNOSIS — R45851 Suicidal ideations: Secondary | ICD-10-CM

## 2024-06-13 DIAGNOSIS — F411 Generalized anxiety disorder: Secondary | ICD-10-CM | POA: Insufficient documentation

## 2024-06-13 DIAGNOSIS — Z79899 Other long term (current) drug therapy: Secondary | ICD-10-CM | POA: Insufficient documentation

## 2024-06-13 DIAGNOSIS — F1520 Other stimulant dependence, uncomplicated: Secondary | ICD-10-CM | POA: Insufficient documentation

## 2024-06-13 DIAGNOSIS — F418 Other specified anxiety disorders: Secondary | ICD-10-CM | POA: Diagnosis not present

## 2024-06-13 DIAGNOSIS — F1310 Sedative, hypnotic or anxiolytic abuse, uncomplicated: Secondary | ICD-10-CM

## 2024-06-13 DIAGNOSIS — F1994 Other psychoactive substance use, unspecified with psychoactive substance-induced mood disorder: Secondary | ICD-10-CM | POA: Diagnosis not present

## 2024-06-13 DIAGNOSIS — F1914 Other psychoactive substance abuse with psychoactive substance-induced mood disorder: Secondary | ICD-10-CM | POA: Diagnosis present

## 2024-06-13 DIAGNOSIS — F1210 Cannabis abuse, uncomplicated: Secondary | ICD-10-CM | POA: Insufficient documentation

## 2024-06-13 DIAGNOSIS — F17200 Nicotine dependence, unspecified, uncomplicated: Secondary | ICD-10-CM | POA: Diagnosis not present

## 2024-06-13 DIAGNOSIS — R4587 Impulsiveness: Secondary | ICD-10-CM | POA: Diagnosis not present

## 2024-06-13 DIAGNOSIS — F319 Bipolar disorder, unspecified: Secondary | ICD-10-CM | POA: Insufficient documentation

## 2024-06-13 DIAGNOSIS — Z720 Tobacco use: Secondary | ICD-10-CM | POA: Insufficient documentation

## 2024-06-13 DIAGNOSIS — F1529 Other stimulant dependence with unspecified stimulant-induced disorder: Secondary | ICD-10-CM | POA: Diagnosis not present

## 2024-06-13 LAB — RAPID URINE DRUG SCREEN, HOSP PERFORMED
Amphetamines: POSITIVE — AB
Barbiturates: NOT DETECTED
Benzodiazepines: POSITIVE — AB
Cocaine: NOT DETECTED
Opiates: NOT DETECTED
Tetrahydrocannabinol: POSITIVE — AB

## 2024-06-13 LAB — COMPREHENSIVE METABOLIC PANEL WITH GFR
ALT: 15 U/L (ref 0–44)
AST: 21 U/L (ref 15–41)
Albumin: 3.9 g/dL (ref 3.5–5.0)
Alkaline Phosphatase: 57 U/L (ref 38–126)
Anion gap: 9 (ref 5–15)
BUN: 9 mg/dL (ref 6–20)
CO2: 26 mmol/L (ref 22–32)
Calcium: 9.4 mg/dL (ref 8.9–10.3)
Chloride: 102 mmol/L (ref 98–111)
Creatinine, Ser: 0.96 mg/dL (ref 0.61–1.24)
GFR, Estimated: >60 mL/min (ref >60)
Glucose, Bld: 106 mg/dL — ABNORMAL HIGH (ref 70–99)
Potassium: 3.8 mmol/L (ref 3.5–5.1)
Sodium: 137 mmol/L (ref 135–145)
Total Bilirubin: 0.6 mg/dL (ref 0.0–1.2)
Total Protein: 7.4 g/dL (ref 6.5–8.1)

## 2024-06-13 LAB — CBC
HCT: 42.6 % (ref 39.0–52.0)
Hemoglobin: 14.2 g/dL (ref 13.0–17.0)
MCH: 29.6 pg (ref 26.0–34.0)
MCHC: 33.3 g/dL (ref 30.0–36.0)
MCV: 88.8 fL (ref 80.0–100.0)
Platelets: 314 K/uL (ref 150–400)
RBC: 4.8 MIL/uL (ref 4.22–5.81)
RDW: 12.7 % (ref 11.5–15.5)
WBC: 8.5 K/uL (ref 4.0–10.5)
nRBC: 0 % (ref 0.0–0.2)

## 2024-06-13 LAB — ETHANOL: Alcohol, Ethyl (B): <15 mg/dL (ref <15)

## 2024-06-13 MED ORDER — ACETAMINOPHEN 325 MG PO TABS: 650.0000 mg | ORAL_TABLET | Freq: Four times a day (QID) | ORAL | Status: DC | PRN

## 2024-06-13 MED ORDER — FLUOXETINE HCL 20 MG PO CAPS
20.0000 mg | ORAL_CAPSULE | Freq: Every day | ORAL | Status: DC
  Administered 2024-06-14 – 2024-06-16 (×3): 20 mg via ORAL
  Filled 2024-06-13 (×3): qty 1

## 2024-06-13 MED ORDER — OLANZAPINE 5 MG PO TABS: 5.0000 mg | ORAL_TABLET | Freq: Two times a day (BID) | ORAL | Status: DC

## 2024-06-13 MED ORDER — TRAZODONE HCL 50 MG PO TABS
50.0000 mg | ORAL_TABLET | Freq: Every evening | ORAL | Status: DC | PRN
  Filled 2024-06-13: qty 1

## 2024-06-13 MED ORDER — HALOPERIDOL LACTATE 5 MG/ML IJ SOLN: 5.0000 mg | Freq: Three times a day (TID) | INTRAMUSCULAR | Status: DC | PRN

## 2024-06-13 MED ORDER — DIPHENHYDRAMINE HCL 50 MG PO CAPS: 50.0000 mg | ORAL_CAPSULE | Freq: Three times a day (TID) | ORAL | Status: DC | PRN

## 2024-06-13 MED ORDER — ALUM & MAG HYDROXIDE-SIMETH 200-200-20 MG/5ML PO SUSP: 30.0000 mL | ORAL | Status: DC | PRN

## 2024-06-13 MED ORDER — HYDROXYZINE HCL 25 MG PO TABS: 25.0000 mg | ORAL_TABLET | Freq: Three times a day (TID) | ORAL | Status: DC | PRN

## 2024-06-13 MED ORDER — OLANZAPINE 5 MG PO TABS
5.0000 mg | ORAL_TABLET | Freq: Once | ORAL | Status: AC
  Administered 2024-06-13: 5 mg via ORAL
  Filled 2024-06-13: qty 1

## 2024-06-13 MED ORDER — MAGNESIUM HYDROXIDE 400 MG/5ML PO SUSP: 30.0000 mL | Freq: Every day | ORAL | Status: DC | PRN

## 2024-06-13 MED ORDER — NAPROXEN 500 MG PO TABS
500.0000 mg | ORAL_TABLET | Freq: Two times a day (BID) | ORAL | Status: DC | PRN
  Administered 2024-06-15: 500 mg via ORAL
  Filled 2024-06-13: qty 1

## 2024-06-13 MED ORDER — LORAZEPAM 2 MG/ML IJ SOLN: 2.0000 mg | Freq: Three times a day (TID) | INTRAMUSCULAR | Status: DC | PRN

## 2024-06-13 MED ORDER — DIPHENHYDRAMINE HCL 50 MG/ML IJ SOLN: 50.0000 mg | Freq: Three times a day (TID) | INTRAMUSCULAR | Status: DC | PRN

## 2024-06-13 MED ORDER — HALOPERIDOL 5 MG PO TABS: 5.0000 mg | ORAL_TABLET | Freq: Three times a day (TID) | ORAL | Status: DC | PRN

## 2024-06-13 MED ORDER — LOPERAMIDE HCL 2 MG PO CAPS: 2.0000 mg | ORAL_CAPSULE | ORAL | Status: DC | PRN

## 2024-06-13 MED ORDER — HALOPERIDOL LACTATE 5 MG/ML IJ SOLN: 10.0000 mg | Freq: Three times a day (TID) | INTRAMUSCULAR | Status: DC | PRN

## 2024-06-13 MED ORDER — METHOCARBAMOL 500 MG PO TABS: 500.0000 mg | ORAL_TABLET | Freq: Three times a day (TID) | ORAL | Status: DC | PRN

## 2024-06-13 MED ORDER — FLUOXETINE HCL 20 MG PO CAPS
20.0000 mg | ORAL_CAPSULE | Freq: Every day | ORAL | Status: DC
  Administered 2024-06-13: 20 mg via ORAL
  Filled 2024-06-13: qty 1

## 2024-06-13 MED ORDER — ONDANSETRON 4 MG PO TBDP: 4.0000 mg | ORAL_TABLET | Freq: Four times a day (QID) | ORAL | Status: DC | PRN

## 2024-06-13 MED ORDER — TRAZODONE HCL 50 MG PO TABS: 50.0000 mg | ORAL_TABLET | Freq: Every evening | ORAL | Status: DC | PRN

## 2024-06-13 MED ORDER — OLANZAPINE 5 MG PO TABS
5.0000 mg | ORAL_TABLET | Freq: Two times a day (BID) | ORAL | Status: DC
  Administered 2024-06-13 – 2024-06-16 (×6): 5 mg via ORAL
  Filled 2024-06-13 (×6): qty 1

## 2024-06-13 MED ORDER — HYDROXYZINE HCL 25 MG PO TABS
25.0000 mg | ORAL_TABLET | Freq: Four times a day (QID) | ORAL | Status: DC | PRN
  Administered 2024-06-15: 25 mg via ORAL
  Filled 2024-06-13: qty 1

## 2024-06-13 MED ORDER — DICYCLOMINE HCL 20 MG PO TABS: 20.0000 mg | ORAL_TABLET | Freq: Four times a day (QID) | ORAL | Status: DC | PRN

## 2024-06-13 NOTE — ED Notes (Addendum)
 Attempted to call report to [facility], no answer. Will attempt again.

## 2024-06-13 NOTE — ED Notes (Signed)
 This RN spoke with Public relations account executive at [facility], she states she is unable to take report at this time and must speak with her supervisor first and will return call.

## 2024-06-13 NOTE — ED Notes (Signed)
 Patient admitted into [facility] for substance use treatment. On arrival, pt A/OX4. MAE. Denies HI/AVH but confirmed having the thoughts to harm himself but no plan yet. NAD. Skin assessment CDI.Oriented pt to room and unit rules. [facility] offered some food. Environment secured per policy. Will monitor for safety.
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
## 2024-06-13 NOTE — ED Notes (Addendum)
 All belongings removed from locked, pt signed belonging sheets, belongings to be given to safe transport.

## 2024-06-13 NOTE — ED Provider Notes (Signed)
 [place] EMERGENCY DEPARTMENT AT [facility] Provider Note   CSN: [id number] Arrival date & time: 06/13/24  1020     Patient presents with: Hallucinations and Suicidal   Trevor [name] is a 43 y.o. male substance abuse, malingering, bipolar 1, cocaine use is presented to emergency room with complaint of suicidal ideations.  Patient reports suicidal ideation without any specific plan.  Also reports that he is having auditory hallucinations which is making him feel down and depressed.  He denies any chest pain shortness of breath.  No injury trauma or fall.  He admits to substance use.   HPI     Prior to Admission medications   Medication Sig Start Date End Date Taking? Authorizing Provider  ALPRAZolam  (XANAX ) 0.5 MG tablet Take 0.5 mg by mouth 3 (three) times daily as needed for anxiety.    [provider]  amLODipine  (NORVASC ) 10 MG tablet Take 1 tablet (10 mg total) by mouth daily. 03/12/24 04/11/24  Neysa Caron PARAS, DO  escitalopram  (LEXAPRO ) 10 MG tablet Take 1 tablet (10 mg total) by mouth daily. 03/16/24   Bernard Drivers, MD  FLUoxetine  (PROZAC ) 10 MG capsule Take 10 mg by mouth daily. 03/25/24 06/23/24  [provider]  nicotine  (NICODERM CQ  - DOSED IN MG/24 HOURS) 14 mg/24hr patch Place 1 patch (14 mg total) onto the skin daily. Patient taking differently: Place 14 mg onto the skin daily as needed (Cravings). 02/09/24   Hinda [name], MD  OLANZapine  (ZYPREXA ) 10 MG tablet Take 1 tablet (10 mg total) by mouth at bedtime. 03/16/24   Steinl, Kevin, MD    Allergies: Penicillins and Augmentin [amoxicillin-pot clavulanate]    Review of Systems  Psychiatric/Behavioral:  Positive for hallucinations and suicidal ideas.     Updated Vital Signs BP 128/76   Pulse (!) 104   Temp 98.6 F (37 C) (Oral)   Resp 20   Ht 6' (1.829 m)   Wt 68 kg   SpO2 100%   BMI 20.34 kg/m   Physical Exam Vitals and nursing note reviewed.  Constitutional:       General: He is not in acute distress.    Appearance: He is not toxic-appearing.  HENT:     Head: Normocephalic and atraumatic.  Eyes:     General: No scleral icterus.    Conjunctiva/sclera: Conjunctivae normal.  Cardiovascular:     Rate and Rhythm: Normal rate and regular rhythm.     Pulses: Normal pulses.     Heart sounds: Normal heart sounds.  Pulmonary:     Effort: Pulmonary effort is normal. No respiratory distress.     Breath sounds: Normal breath sounds.  Abdominal:     General: Abdomen is flat. Bowel sounds are normal.     Palpations: Abdomen is soft.     Tenderness: There is no abdominal tenderness.  Musculoskeletal:     Right lower leg: No edema.     Left lower leg: No edema.  Skin:    General: Skin is warm and dry.     Findings: No lesion.  Neurological:     General: No focal deficit present.     Mental Status: He is alert and oriented to person, place, and time. Mental status is at baseline.  Psychiatric:        Mood and Affect: Mood is anxious. Affect is blunt.     (all labs ordered are listed, but only abnormal results are displayed) Labs Reviewed  COMPREHENSIVE METABOLIC PANEL WITH  GFR - Abnormal; Notable for the following components:      Result Value   Glucose, Bld 106 (*)    All other components within normal limits  RAPID URINE DRUG SCREEN, HOSP PERFORMED - Abnormal; Notable for the following components:   Benzodiazepines POSITIVE (*)    Amphetamines POSITIVE (*)    Tetrahydrocannabinol POSITIVE (*)    All other components within normal limits  ETHANOL  CBC    EKG: None  Radiology: No results found.   Procedures   Medications Ordered in the ED - No data to display                                  Medical Decision Making Amount and/or Complexity of Data Reviewed Labs: ordered.   This patient presents to the ED for concern of SI and hallucinations, this involves an extensive number of treatment options, and is a complaint that carries  with it a high risk of complications and morbidity.  The differential diagnosis includes suicidal ideation, drug abuse, malingering   Lab Tests:  I personally interpreted labs.  The pertinent results include:   CBC unremarkable.  CMP unremarkable.  EtOH is negative. Urine drug screen positive for benzos, amphetamine and THC  Cardiac Monitoring: / EKG:  The patient was maintained on a cardiac monitor.     Problem List / ED Course / Critical interventions / Medication management  Patient presents with complaint of anxiety and depression as well as substance use.  He has history of substance use disorder and he is now presenting with auditory hallucinations.  Patient has not been compliant with his psych medicines.  He admits that he is feeling suicidal and does not feel safe leaving.  He wants to have better overall mental health. Will obtain medical clearance labs and clear for TTS consult. Patient with SI and hallucinations, has history of similar. No plan reported.  Patient's disposition has been psych admission.        Final diagnoses:  Suicidal ideation  Hallucinations    ED Discharge Orders     None          Shermon Warren LOISE DEVONNA 06/13/24 1727    Geraldene Hamilton, MD 06/13/24 1734

## 2024-06-13 NOTE — Progress Notes (Signed)
 Pt has been accepted to [facility] on 06/13/2024  . Bed assignment:160  Pt meets inpatient criteria per Elveria Batter, NP   Attending Physician will be Dr. Cole   Report can be called to: - [phone]  Care Team Notified: [facility] Burnard Barter, RN, [place] Nickerson, RN, Elveria Batter, NP

## 2024-06-13 NOTE — ED Notes (Signed)
 Pt. Changed into disposable scrubs. Pt. Belongings logged and inventory sheet signed by tech and pt.

## 2024-06-13 NOTE — ED Triage Notes (Signed)
 Pt states he is hearing voices x 1 day & having suicidal thoughts. Denies having suicide plan at time of triage.

## 2024-06-13 NOTE — ED Provider Notes (Signed)
 Facility Based Crisis Admission H&P  Date: 06/13/24 Patient Name: Trevor [name] MRN: [id number] Chief Complaint: I'm here for my mental health and drug abuse.  Diagnoses:  Final diagnoses:  Methamphetamine use disorder, severe, dependence (HCC)  Suicidal ideation  Substance induced mood disorder (HCC)  Cannabis abuse, continuous use  Homelessness unspecified    HPI: Trevor Muehl. [name] is a 43 year old male with psychiatric history of bipolar 1 disorder, GAD, homelessness, narcotic ideation, poorly substance abuse-cocaine, tobacco, methamphetamine, cannabis, psychosis, and suicidal ideations, who presented voluntarily to [facility] as a direct admit from [facility] to the [facility] for substance abuse treatment..  Patient was seen face-to-face by this provider and chart reviewed.  Per chart review, patient presented earlier to the ED today endorsing suicidal ideations and auditory hallucinations in the context of meth amphetamine dependence. Patient has history of repeated ED presentations and multiple prior psychiatric hospitalizations. He is seeking psychiatric admission and referral to long-term residential program.  Patient reports a history of substance treatments/detox at [facility] 19 years ago.   He endorses continuous methamphetamine abuse for the past 10 years, currently using about a gram a day, stating I shoot it up.  Last used this a.m. Denies withdrawal symptoms.  Patient also endorses daily marijuana use.  He denies other illicit substance use.  Patient endorses suicidal thoughts, no plan for couple of years.  Endorses auditory hallucinations of voices mumbling. Denies command voices. He is endorsing sheltered homelessness and states I stay with a friend.  He is not established with an outpatient psychiatrist or therapist and prior to today, last took his psychiatric medications a month ago. He was restarted on Zyprexa  and Prozac  in the ED.  On evaluation, patient is alert,  oriented x 3, and cooperative. Speech is clear and coherent. Pt is dressed in hospital scrubs. Eye contact is fair. Mood is euthymic, affect is blunted.. Thought process is coherent and goal directed and thought content is WDL. Pt endorses SI, no plan, denies HI/VH, endorses AH.  Denies command voices.  There is no objective indication that the patient is responding to internal stimuli. No delusions elicited during this assessment.    Patient completed the PHQ-9 questionnaire and obtained a total score of 16, indicating moderately severe depression.  He has been restarted on his antidepressant.   PHQ 2-9:  Flowsheet Row ED from 06/13/2024 in [facility]  Thoughts that you would be better off dead, or of hurting yourself in some way Nearly every day  PHQ-9 Total Score 16    Flowsheet Row ED from 06/13/2024 in [facility] Emergency Department at [facility] ED from 05/04/2024 in [facility] Emergency Department at [facility] ED from 03/15/2024 in [facility] Emergency Department at [facility]  C-SSRS RISK CATEGORY High Risk Moderate Risk High Risk      Total Time spent with patient: 30 minutes  Musculoskeletal  Strength & Muscle Tone: within normal limits Gait & Station: normal Patient leans: N/A  Psychiatric Specialty Exam  Presentation General Appearance:  Other (comment) (in scrubs)  Eye Contact: Fair  Speech: Normal Rate  Speech Volume: Normal  Handedness: Right   Mood and Affect  Mood: Euthymic  Affect: Blunt   Thought Process  Thought Processes: Goal Directed; Coherent  Descriptions of Associations:Intact  Orientation:Full (Time, Place and Person)  Thought Content:WDL  Diagnosis of Schizophrenia or Schizoaffective disorder in past: No   Hallucinations:Hallucinations: Auditory Description of Auditory Hallucinations: Pt reports hearing mumblimg voices.  Ideas of Reference:None  Suicidal  Thoughts:Suicidal Thoughts: Yes, Passive SI Passive Intent and/or Plan: Without Plan  Homicidal Thoughts:Homicidal Thoughts: No   Sensorium  Memory: Immediate Fair  Judgment: Poor  Insight: Poor   Executive Functions  Concentration: Good  Attention Span: Good  Recall: Good  Fund of Knowledge: Good  Language: Good   Psychomotor Activity  Psychomotor Activity: Psychomotor Activity: Normal   Assets  Assets: Communication Skills; Desire for Improvement   Sleep  Sleep: Sleep: Fair   Nutritional Assessment (For OBS and FBC admissions only) Has the patient had a weight loss or gain of 10 pounds or more in the last 3 months?: No Has the patient had a decrease in food intake/or appetite?: No Does the patient have dental problems?: No Does the patient have eating habits or behaviors that may be indicators of an eating disorder including binging or inducing vomiting?: No Has the patient recently lost weight without trying?: 0 Has the patient been eating poorly because of a decreased appetite?: 0 Malnutrition Screening Tool Score: 0    Physical Exam Constitutional:      Appearance: He is not toxic-appearing or diaphoretic.  HENT:     Nose: No congestion.  Cardiovascular:     Rate and Rhythm: Normal rate.  Pulmonary:     Effort: No respiratory distress.  Chest:     Chest wall: No tenderness.  Neurological:     Mental Status: He is alert and oriented to person, place, and time.  Psychiatric:        Attention and Perception: Attention normal. He perceives auditory hallucinations.        Mood and Affect: Mood normal. Affect is blunt.        Speech: Speech normal.        Behavior: Behavior is actively hallucinating. Behavior is cooperative.        Thought Content: Thought content includes suicidal ideation.    Review of Systems  Constitutional:  Negative for chills, diaphoresis and fever.  HENT:  Negative for congestion.   Eyes:  Negative for  discharge.  Respiratory:  Negative for cough, shortness of breath and wheezing.   Cardiovascular:  Negative for chest pain and palpitations.  Gastrointestinal:  Negative for diarrhea, nausea and vomiting.  Neurological:  Negative for dizziness, seizures, weakness and headaches.  Psychiatric/Behavioral:  Positive for hallucinations, substance abuse and suicidal ideas. The patient is nervous/anxious.     Past Psychiatric History: See H & P   Is the patient at risk to self? Yes  Has the patient been a risk to self in the past 6 months? UTD.    Has the patient been a risk to self within the distant past? UTD  Is the patient a risk to others? No   Has the patient been a risk to others in the past 6 months? No   Has the patient been a risk to others within the distant past? No   Past Medical History: See  Chart Family History: N/A Social History: N/A  Last Labs:  Admission on 06/13/2024, Discharged on 06/13/2024  Component Date Value Ref Range Status   Sodium 06/13/2024 137  135 - 145 mmol/L Final   Potassium 06/13/2024 3.8  3.5 - 5.1 mmol/L Final   Chloride 06/13/2024 102  98 - 111 mmol/L Final   CO2 06/13/2024 26  22 - 32 mmol/L Final   Glucose, Bld 06/13/2024 106 (H)  70 - 99 mg/dL Final   Glucose reference range applies only to  samples taken after fasting for at least 8 hours.   BUN 06/13/2024 9  6 - 20 mg/dL Final   Creatinine, Ser 06/13/2024 0.96  0.61 - 1.24 mg/dL Final   Calcium  06/13/2024 9.4  8.9 - 10.3 mg/dL Final   Total Protein 90/75/7974 7.4  6.5 - 8.1 g/dL Final   Albumin 90/75/7974 3.9  3.5 - 5.0 g/dL Final   AST 90/75/7974 21  15 - 41 U/L Final   ALT 06/13/2024 15  0 - 44 U/L Final   Alkaline Phosphatase 06/13/2024 57  38 - 126 U/L Final   Total Bilirubin 06/13/2024 0.6  0.0 - 1.2 mg/dL Final   GFR, Estimated 06/13/2024 >60  >60 mL/min Final   Comment: (NOTE) Calculated using the CKD-EPI Creatinine Equation (2021)    Anion gap 06/13/2024 9  5 - 15 Final    Performed at [facility], [address], [place], [place] [address]   Alcohol, Ethyl (B) 06/13/2024 <15  <15 mg/dL Final   Comment: (NOTE) For medical purposes only. Performed at [facility], [address] [place], [place] [address]    WBC 06/13/2024 8.5  4.0 - 10.5 K/uL Final   RBC 06/13/2024 4.80  4.22 - 5.81 MIL/uL Final   Hemoglobin 06/13/2024 14.2  13.0 - 17.0 g/dL Final   HCT 90/75/7974 42.6  39.0 - 52.0 % Final   MCV 06/13/2024 88.8  80.0 - 100.0 fL Final   MCH 06/13/2024 29.6  26.0 - 34.0 pg Final   MCHC 06/13/2024 33.3  30.0 - 36.0 g/dL Final   RDW 90/75/7974 12.7  11.5 - 15.5 % Final   Platelets 06/13/2024 314  150 - 400 K/uL Final   nRBC 06/13/2024 0.0  0.0 - 0.2 % Final   Performed at [facility], [address], [place], [place] [address]   Opiates 06/13/2024 NONE DETECTED  NONE DETECTED Final   Cocaine 06/13/2024 NONE DETECTED  NONE DETECTED Final   Benzodiazepines 06/13/2024 POSITIVE (A)  NONE DETECTED Final   Amphetamines 06/13/2024 POSITIVE (A)  NONE DETECTED Final   Comment: (NOTE) Trazodone  is metabolized in vivo to several metabolites, including pharmacologically active m-CPP, which is excreted in the urine. Immunoassay screens for amphetamines and MDMA have potential cross-reactivity with these compounds and may provide false positive  results.     Tetrahydrocannabinol 06/13/2024 POSITIVE (A)  NONE DETECTED Final   Barbiturates 06/13/2024 NONE DETECTED  NONE DETECTED Final   Comment: (NOTE) DRUG SCREEN FOR MEDICAL PURPOSES ONLY.  IF CONFIRMATION IS NEEDED FOR ANY PURPOSE, NOTIFY LAB WITHIN 5 DAYS.  LOWEST DETECTABLE LIMITS FOR URINE DRUG SCREEN Drug Class                     Cutoff (ng/mL) Amphetamine and metabolites    1000 Barbiturate and metabolites    200 Benzodiazepine                 200 Opiates and metabolites        300 Cocaine and metabolites        300 THC                            50 Performed at [facility], [address], [place], [place] [address]   Admission on 05/04/2024, Discharged on 05/06/2024  Component Date Value Ref Range Status   Sodium 05/05/2024 137  135 - 145 mmol/L Final  Potassium 05/05/2024 2.8 (L)  3.5 - 5.1 mmol/L Final   Chloride 05/05/2024 104  98 - 111 mmol/L Final   CO2 05/05/2024 22  22 - 32 mmol/L Final   Glucose, Bld 05/05/2024 127 (H)  70 - 99 mg/dL Final   Glucose reference range applies only to samples taken after fasting for at least 8 hours.   BUN 05/05/2024 25 (H)  6 - 20 mg/dL Final   Creatinine, Ser 05/05/2024 1.24  0.61 - 1.24 mg/dL Final   Calcium  05/05/2024 9.2  8.9 - 10.3 mg/dL Final   Total Protein 91/83/7974 7.7  6.5 - 8.1 g/dL Final   Albumin 91/83/7974 4.1  3.5 - 5.0 g/dL Final   AST 91/83/7974 26  15 - 41 U/L Final   ALT 05/05/2024 17  0 - 44 U/L Final   Alkaline Phosphatase 05/05/2024 58  38 - 126 U/L Final   Total Bilirubin 05/05/2024 1.5 (H)  0.0 - 1.2 mg/dL Final   GFR, Estimated 05/05/2024 >60  >60 mL/min Final   Comment: (NOTE) Calculated using the CKD-EPI Creatinine Equation (2021)    Anion gap 05/05/2024 11  5 - 15 Final   Performed at [facility], [address], [place], [place] [address]   Alcohol, Ethyl (B) 05/05/2024 <15  <15 mg/dL Final   Comment: (NOTE) For medical purposes only. Performed at [facility], [address], [place], [place] [address]    WBC 05/05/2024 5.9  4.0 - 10.5 K/uL Final   RBC 05/05/2024 4.48  4.22 - 5.81 MIL/uL Final   Hemoglobin 05/05/2024 13.2  13.0 - 17.0 g/dL Final   HCT 91/83/7974 40.3  39.0 - 52.0 % Final   MCV 05/05/2024 90.0  80.0 - 100.0 fL Final   MCH 05/05/2024 29.5  26.0 - 34.0 pg Final   MCHC 05/05/2024 32.8  30.0 - 36.0 g/dL Final   RDW 91/83/7974 13.4  11.5 - 15.5 % Final   Platelets 05/05/2024 257  150 - 400 K/uL Final   nRBC 05/05/2024 0.0  0.0 - 0.2 % Final   Performed at [facility], [address]  [place], [place] [address]   Opiates 05/05/2024 NONE DETECTED  NONE DETECTED Final   Cocaine 05/05/2024 NONE DETECTED  NONE DETECTED Final   Benzodiazepines 05/05/2024 POSITIVE (A)  NONE DETECTED Final   Amphetamines 05/05/2024 POSITIVE (A)  NONE DETECTED Final   Comment: (NOTE) Trazodone  is metabolized in vivo to several metabolites, including pharmacologically active m-CPP, which is excreted in the urine. Immunoassay screens for amphetamines and MDMA have potential cross-reactivity with these compounds and may provide false positive  results.     Tetrahydrocannabinol 05/05/2024 POSITIVE (A)  NONE DETECTED Final   Barbiturates 05/05/2024 NONE DETECTED  NONE DETECTED Final   Comment: (NOTE) DRUG SCREEN FOR MEDICAL PURPOSES ONLY.  IF CONFIRMATION IS NEEDED FOR ANY PURPOSE, NOTIFY LAB WITHIN 5 DAYS.  LOWEST DETECTABLE LIMITS FOR URINE DRUG SCREEN Drug Class                     Cutoff (ng/mL) Amphetamine and metabolites    1000 Barbiturate and metabolites    200 Benzodiazepine                 200 Opiates and metabolites        300 Cocaine and metabolites        300 THC  50 Performed at [facility], [address], [place], [place] [address]   Admission on 03/15/2024, Discharged on 03/16/2024  Component Date Value Ref Range Status   Sodium 03/15/2024 138  135 - 145 mmol/L Final   Potassium 03/15/2024 3.7  3.5 - 5.1 mmol/L Final   Chloride 03/15/2024 104  98 - 111 mmol/L Final   CO2 03/15/2024 24  22 - 32 mmol/L Final   Glucose, Bld 03/15/2024 83  70 - 99 mg/dL Final   Glucose reference range applies only to samples taken after fasting for at least 8 hours.   BUN 03/15/2024 20  6 - 20 mg/dL Final   Creatinine, Ser 03/15/2024 0.86  0.61 - 1.24 mg/dL Final   Calcium  03/15/2024 9.2  8.9 - 10.3 mg/dL Final   Total Protein 93/73/7974 6.7  6.5 - 8.1 g/dL Final   Albumin 93/73/7974 3.9  3.5 - 5.0 g/dL Final   AST 93/73/7974 19  15 - 41 U/L  Final   ALT 03/15/2024 17  0 - 44 U/L Final   Alkaline Phosphatase 03/15/2024 45  38 - 126 U/L Final   Total Bilirubin 03/15/2024 0.8  0.0 - 1.2 mg/dL Final   GFR, Estimated 03/15/2024 >60  >60 mL/min Final   Comment: (NOTE) Calculated using the CKD-EPI Creatinine Equation (2021)    Anion gap 03/15/2024 10  5 - 15 Final   Performed at [facility], [address], [place], [place] [address]   Alcohol, Ethyl (B) 03/15/2024 <15  <15 mg/dL Final   Comment: (NOTE) For medical purposes only. Performed at [facility], [address] [place], [place] [address]    Opiates 03/15/2024 NONE DETECTED  NONE DETECTED Final   Cocaine 03/15/2024 NONE DETECTED  NONE DETECTED Final   Benzodiazepines 03/15/2024 NONE DETECTED  NONE DETECTED Final   Amphetamines 03/15/2024 POSITIVE (A)  NONE DETECTED Final   Comment: (NOTE) Trazodone  is metabolized in vivo to several metabolites, including pharmacologically active m-CPP, which is excreted in the urine. Immunoassay screens for amphetamines and MDMA have potential cross-reactivity with these compounds and may provide false positive  results.     Tetrahydrocannabinol 03/15/2024 POSITIVE (A)  NONE DETECTED Final   Barbiturates 03/15/2024 NONE DETECTED  NONE DETECTED Final   Comment: (NOTE) DRUG SCREEN FOR MEDICAL PURPOSES ONLY.  IF CONFIRMATION IS NEEDED FOR ANY PURPOSE, NOTIFY LAB WITHIN 5 DAYS.  LOWEST DETECTABLE LIMITS FOR URINE DRUG SCREEN Drug Class                     Cutoff (ng/mL) Amphetamine and metabolites    1000 Barbiturate and metabolites    200 Benzodiazepine                 200 Opiates and metabolites        300 Cocaine and metabolites        300 THC                            50 Performed at [facility], [address], [place], [place] [address]    WBC 03/15/2024 6.7  4.0 - 10.5 K/uL Final   RBC 03/15/2024 4.59  4.22 - 5.81 MIL/uL Final   Hemoglobin 03/15/2024 13.5  13.0 - 17.0 g/dL Final   HCT  93/73/7974 39.9  39.0 - 52.0 % Final   MCV 03/15/2024 86.9  80.0 - 100.0 fL Final  MCH 03/15/2024 29.4  26.0 - 34.0 pg Final   MCHC 03/15/2024 33.8  30.0 - 36.0 g/dL Final   RDW 93/73/7974 13.9  11.5 - 15.5 % Final   Platelets 03/15/2024 281  150 - 400 K/uL Final   nRBC 03/15/2024 0.0  0.0 - 0.2 % Final   Neutrophils Relative % 03/15/2024 60  % Final   Neutro Abs 03/15/2024 4.0  1.7 - 7.7 K/uL Final   Lymphocytes Relative 03/15/2024 32  % Final   Lymphs Abs 03/15/2024 2.1  0.7 - 4.0 K/uL Final   Monocytes Relative 03/15/2024 6  % Final   Monocytes Absolute 03/15/2024 0.4  0.1 - 1.0 K/uL Final   Eosinophils Relative 03/15/2024 1  % Final   Eosinophils Absolute 03/15/2024 0.1  0.0 - 0.5 K/uL Final   Basophils Relative 03/15/2024 1  % Final   Basophils Absolute 03/15/2024 0.0  0.0 - 0.1 K/uL Final   Immature Granulocytes 03/15/2024 0  % Final   Abs Immature Granulocytes 03/15/2024 0.02  0.00 - 0.07 K/uL Final   Performed at [facility], [address], [place], [place] [address]   Acetaminophen  (Tylenol ), Serum 03/15/2024 <10 (L)  10 - 30 ug/mL Final   Comment: (NOTE) Therapeutic concentrations vary significantly. A range of 10-30 ug/mL  may be an effective concentration for many patients. However, some  are best treated at concentrations outside of this range. Acetaminophen  concentrations >150 ug/mL at 4 hours after ingestion  and >50 ug/mL at 12 hours after ingestion are often associated with  toxic reactions.  Performed at [facility], [address], [place], [place] [address]    Salicylate Lvl 03/15/2024 <7.0 (L)  7.0 - 30.0 mg/dL Final   Performed at [facility], [address] [place], [place] [address]  Admission on 03/12/2024, Discharged on 03/12/2024  Component Date Value Ref Range Status   Sodium 03/12/2024 139  135 - 145 mmol/L Final   Potassium 03/12/2024 3.2 (L)  3.5 - 5.1 mmol/L Final   Chloride 03/12/2024 105  98 - 111 mmol/L Final   CO2  03/12/2024 25  22 - 32 mmol/L Final   Glucose, Bld 03/12/2024 105 (H)  70 - 99 mg/dL Final   Glucose reference range applies only to samples taken after fasting for at least 8 hours.   BUN 03/12/2024 25 (H)  6 - 20 mg/dL Final   Creatinine, Ser 03/12/2024 0.87  0.61 - 1.24 mg/dL Final   Calcium  03/12/2024 8.9  8.9 - 10.3 mg/dL Final   Total Protein 93/76/7974 7.3  6.5 - 8.1 g/dL Final   Albumin 93/76/7974 4.2  3.5 - 5.0 g/dL Final   AST 93/76/7974 21  15 - 41 U/L Final   ALT 03/12/2024 17  0 - 44 U/L Final   Alkaline Phosphatase 03/12/2024 44  38 - 126 U/L Final   Total Bilirubin 03/12/2024 1.2  0.0 - 1.2 mg/dL Final   GFR, Estimated 03/12/2024 >60  >60 mL/min Final   Comment: (NOTE) Calculated using the CKD-EPI Creatinine Equation (2021)    Anion gap 03/12/2024 9  5 - 15 Final   Performed at [facility], [address] [place], [place] [address]   Alcohol, Ethyl (B) 03/12/2024 <15  <15 mg/dL Final   Comment: (NOTE) For medical purposes only. Performed at [facility], [address], [place], [place] [address]    WBC 03/12/2024 7.2  4.0 - 10.5 K/uL Final  RBC 03/12/2024 4.31  4.22 - 5.81 MIL/uL Final   Hemoglobin 03/12/2024 12.6 (L)  13.0 - 17.0 g/dL Final   HCT 93/76/7974 38.0 (L)  39.0 - 52.0 % Final   MCV 03/12/2024 88.2  80.0 - 100.0 fL Final   MCH 03/12/2024 29.2  26.0 - 34.0 pg Final   MCHC 03/12/2024 33.2  30.0 - 36.0 g/dL Final   RDW 93/76/7974 14.6  11.5 - 15.5 % Final   Platelets 03/12/2024 249  150 - 400 K/uL Final   nRBC 03/12/2024 0.0  0.0 - 0.2 % Final   Performed at [facility], [address] [place], [place] [address]   Opiates 03/12/2024 NONE DETECTED  NONE DETECTED Final   Cocaine 03/12/2024 NONE DETECTED  NONE DETECTED Final   Benzodiazepines 03/12/2024 NONE DETECTED  NONE DETECTED Final   Amphetamines 03/12/2024 POSITIVE (A)  NONE DETECTED Final   Comment: (NOTE) Trazodone  is metabolized in  vivo to several metabolites, including pharmacologically active m-CPP, which is excreted in the urine. Immunoassay screens for amphetamines and MDMA have potential cross-reactivity with these compounds and may provide false positive  results.     Tetrahydrocannabinol 03/12/2024 POSITIVE (A)  NONE DETECTED Final   Barbiturates 03/12/2024 NONE DETECTED  NONE DETECTED Final   Comment: (NOTE) DRUG SCREEN FOR MEDICAL PURPOSES ONLY.  IF CONFIRMATION IS NEEDED FOR ANY PURPOSE, NOTIFY LAB WITHIN 5 DAYS.  LOWEST DETECTABLE LIMITS FOR URINE DRUG SCREEN Drug Class                     Cutoff (ng/mL) Amphetamine and metabolites    1000 Barbiturate and metabolites    200 Benzodiazepine                 200 Opiates and metabolites        300 Cocaine and metabolites        300 THC                            50 Performed at [facility], [address], [place], [place] [address]   Admission on 02/24/2024, Discharged on 02/25/2024  Component Date Value Ref Range Status   Sodium 02/24/2024 137  135 - 145 mmol/L Final   Potassium 02/24/2024 4.2  3.5 - 5.1 mmol/L Final   Chloride 02/24/2024 101  98 - 111 mmol/L Final   CO2 02/24/2024 29  22 - 32 mmol/L Final   Glucose, Bld 02/24/2024 95  70 - 99 mg/dL Final   Glucose reference range applies only to samples taken after fasting for at least 8 hours.   BUN 02/24/2024 8  6 - 20 mg/dL Final   Creatinine, Ser 02/24/2024 0.95  0.61 - 1.24 mg/dL Final   Calcium  02/24/2024 9.3  8.9 - 10.3 mg/dL Final   Total Protein 93/93/7974 7.2  6.5 - 8.1 g/dL Final   Albumin 93/93/7974 4.0  3.5 - 5.0 g/dL Final   AST 93/93/7974 23  15 - 41 U/L Final   ALT 02/24/2024 19  0 - 44 U/L Final   Alkaline Phosphatase 02/24/2024 52  38 - 126 U/L Final   Total Bilirubin 02/24/2024 0.6  0.0 - 1.2 mg/dL Final   GFR, Estimated 02/24/2024 >60  >60 mL/min Final   Comment: (NOTE) Calculated using the CKD-EPI Creatinine Equation (2021)    Anion gap 02/24/2024  7  5 - 15 Final   Performed at [facility], [address] [place], [place] [address]   Alcohol, Ethyl (B) 02/24/2024 <15  <15 mg/dL Final   Comment: (NOTE) For medical purposes only. Performed at [facility], [address] [place], [place] [address]    Opiates 02/24/2024 NONE DETECTED  NONE DETECTED Final   Cocaine 02/24/2024 NONE DETECTED  NONE DETECTED Final   Benzodiazepines 02/24/2024 POSITIVE (A)  NONE DETECTED Final   Amphetamines 02/24/2024 POSITIVE (A)  NONE DETECTED Final   Comment: (NOTE) Trazodone  is metabolized in vivo to several metabolites, including pharmacologically active m-CPP, which is excreted in the urine. Immunoassay screens for amphetamines and MDMA have potential cross-reactivity with these compounds and may provide false positive  results.     Tetrahydrocannabinol 02/24/2024 POSITIVE (A)  NONE DETECTED Final   Barbiturates 02/24/2024 NONE DETECTED  NONE DETECTED Final   Comment: (NOTE) DRUG SCREEN FOR MEDICAL PURPOSES ONLY.  IF CONFIRMATION IS NEEDED FOR ANY PURPOSE, NOTIFY LAB WITHIN 5 DAYS.  LOWEST DETECTABLE LIMITS FOR URINE DRUG SCREEN Drug Class                     Cutoff (ng/mL) Amphetamine and metabolites    1000 Barbiturate and metabolites    200 Benzodiazepine                 200 Opiates and metabolites        300 Cocaine and metabolites        300 THC                            50 Performed at [facility], [address] [place], [place] [address]    WBC 02/24/2024 5.7  4.0 - 10.5 K/uL Final   RBC 02/24/2024 5.10  4.22 - 5.81 MIL/uL Final   Hemoglobin 02/24/2024 15.0  13.0 - 17.0 g/dL Final   HCT 93/93/7974 45.0  39.0 - 52.0 % Final   MCV 02/24/2024 88.2  80.0 - 100.0 fL Final   MCH 02/24/2024 29.4  26.0 - 34.0 pg Final   MCHC 02/24/2024 33.3  30.0 - 36.0 g/dL Final   RDW 93/93/7974 13.8  11.5 - 15.5 % Final   Platelets 02/24/2024 268  150 - 400 K/uL Final   nRBC 02/24/2024 0.0  0.0 - 0.2 % Final    Neutrophils Relative % 02/24/2024 67  % Final   Neutro Abs 02/24/2024 3.9  1.7 - 7.7 K/uL Final   Lymphocytes Relative 02/24/2024 24  % Final   Lymphs Abs 02/24/2024 1.4  0.7 - 4.0 K/uL Final   Monocytes Relative 02/24/2024 6  % Final   Monocytes Absolute 02/24/2024 0.3  0.1 - 1.0 K/uL Final   Eosinophils Relative 02/24/2024 2  % Final   Eosinophils Absolute 02/24/2024 0.1  0.0 - 0.5 K/uL Final   Basophils Relative 02/24/2024 1  % Final   Basophils Absolute 02/24/2024 0.0  0.0 - 0.1 K/uL Final   Immature Granulocytes 02/24/2024 0  % Final   Abs Immature Granulocytes 02/24/2024 0.01  0.00 - 0.07 K/uL Final   Performed at [facility] [facility], [address], [place], [place] [address]  Admission on 02/22/2024, Discharged on 02/22/2024  Component Date Value Ref Range Status   Sodium 02/22/2024 138  135 - 145 mmol/L Final   Potassium 02/22/2024 3.4 (L)  3.5 - 5.1 mmol/L Final   Chloride 02/22/2024 103  98 - 111 mmol/L Final  CO2 02/22/2024 27  22 - 32 mmol/L Final   Glucose, Bld 02/22/2024 112 (H)  70 - 99 mg/dL Final   Glucose reference range applies only to samples taken after fasting for at least 8 hours.   BUN 02/22/2024 10  6 - 20 mg/dL Final   Creatinine, Ser 02/22/2024 0.82  0.61 - 1.24 mg/dL Final   Calcium  02/22/2024 8.9  8.9 - 10.3 mg/dL Final   Total Protein 93/95/7974 7.0  6.5 - 8.1 g/dL Final   Albumin 93/95/7974 3.8  3.5 - 5.0 g/dL Final   AST 93/95/7974 22  15 - 41 U/L Final   ALT 02/22/2024 21  0 - 44 U/L Final   Alkaline Phosphatase 02/22/2024 53  38 - 126 U/L Final   Total Bilirubin 02/22/2024 0.8  0.0 - 1.2 mg/dL Final   GFR, Estimated 02/22/2024 >60  >60 mL/min Final   Comment: (NOTE) Calculated using the CKD-EPI Creatinine Equation (2021)    Anion gap 02/22/2024 8  5 - 15 Final   Performed at [facility], [address], [place], [place] [address]   WBC 02/22/2024 6.1  4.0 - 10.5 K/uL Final   RBC 02/22/2024 4.10 (L)  4.22 - 5.81 MIL/uL Final    Hemoglobin 02/22/2024 11.9 (L)  13.0 - 17.0 g/dL Final   HCT 93/95/7974 36.8 (L)  39.0 - 52.0 % Final   MCV 02/22/2024 89.8  80.0 - 100.0 fL Final   MCH 02/22/2024 29.0  26.0 - 34.0 pg Final   MCHC 02/22/2024 32.3  30.0 - 36.0 g/dL Final   RDW 93/95/7974 13.9  11.5 - 15.5 % Final   Platelets 02/22/2024 229  150 - 400 K/uL Final   nRBC 02/22/2024 0.0  0.0 - 0.2 % Final   Performed at [facility], [address], [place], [place] [address]  Admission on 01/29/2024, Discharged on 01/30/2024  Component Date Value Ref Range Status   Sodium 01/29/2024 140  135 - 145 mmol/L Final   Potassium 01/29/2024 3.8  3.5 - 5.1 mmol/L Final   Chloride 01/29/2024 103  98 - 111 mmol/L Final   CO2 01/29/2024 25  22 - 32 mmol/L Final   Glucose, Bld 01/29/2024 108 (H)  70 - 99 mg/dL Final   Glucose reference range applies only to samples taken after fasting for at least 8 hours.   BUN 01/29/2024 9  6 - 20 mg/dL Final   Creatinine, Ser 01/29/2024 0.83  0.61 - 1.24 mg/dL Final   Calcium  01/29/2024 9.4  8.9 - 10.3 mg/dL Final   Total Protein 94/88/7974 7.0  6.5 - 8.1 g/dL Final   Albumin 94/88/7974 4.0  3.5 - 5.0 g/dL Final   AST 94/88/7974 27  15 - 41 U/L Final   ALT 01/29/2024 38  0 - 44 U/L Final   Alkaline Phosphatase 01/29/2024 54  38 - 126 U/L Final   Total Bilirubin 01/29/2024 0.9  0.0 - 1.2 mg/dL Final   GFR, Estimated 01/29/2024 >60  >60 mL/min Final   Comment: (NOTE) Calculated using the CKD-EPI Creatinine Equation (2021)    Anion gap 01/29/2024 12  5 - 15 Final   Performed at [facility], [address], [place], [place] [address]   WBC 01/29/2024 8.6  4.0 - 10.5 K/uL Final   RBC 01/29/2024 4.46  4.22 - 5.81 MIL/uL Final   Hemoglobin 01/29/2024 12.9 (L)  13.0 - 17.0 g/dL Final   HCT 94/88/7974 38.8 (L)  39.0 -  52.0 % Final   MCV 01/29/2024 87.0  80.0 - 100.0 fL Final   MCH 01/29/2024 28.9  26.0 - 34.0 pg Final   MCHC 01/29/2024 33.2  30.0 - 36.0 g/dL Final   RDW  94/88/7974 13.9  11.5 - 15.5 % Final   Platelets 01/29/2024 239  150 - 400 K/uL Final   nRBC 01/29/2024 0.0  0.0 - 0.2 % Final   Neutrophils Relative % 01/29/2024 90  % Final   Neutro Abs 01/29/2024 7.6  1.7 - 7.7 K/uL Final   Lymphocytes Relative 01/29/2024 7  % Final   Lymphs Abs 01/29/2024 0.6 (L)  0.7 - 4.0 K/uL Final   Monocytes Relative 01/29/2024 3  % Final   Monocytes Absolute 01/29/2024 0.3  0.1 - 1.0 K/uL Final   Eosinophils Relative 01/29/2024 0  % Final   Eosinophils Absolute 01/29/2024 0.0  0.0 - 0.5 K/uL Final   Basophils Relative 01/29/2024 0  % Final   Basophils Absolute 01/29/2024 0.0  0.0 - 0.1 K/uL Final   Immature Granulocytes 01/29/2024 0  % Final   Abs Immature Granulocytes 01/29/2024 0.03  0.00 - 0.07 K/uL Final   Performed at [facility], [address] [place], [place] [address]   Salicylate Lvl 01/29/2024 <7.0 (L)  7.0 - 30.0 mg/dL Final   Performed at [facility], [address] [place], [place] [address]   Acetaminophen  (Tylenol ), Serum 01/29/2024 <10 (L)  10 - 30 ug/mL Final   Comment: (NOTE) Therapeutic concentrations vary significantly. A range of 10-30 ug/mL  may be an effective concentration for many patients. However, some  are best treated at concentrations outside of this range. Acetaminophen  concentrations >150 ug/mL at 4 hours after ingestion  and >50 ug/mL at 12 hours after ingestion are often associated with  toxic reactions.  Performed at [facility], [address] [place], [place] [address]    Alcohol, Ethyl (B) 01/29/2024 <15  <15 mg/dL Final   Comment: Please note change in reference range. (NOTE) For medical purposes only. Performed at [facility], [address] [place], [place] [address]   Admission on 01/28/2024, Discharged on 01/28/2024  Component Date Value Ref Range Status   Sodium 01/28/2024 138  135 - 145 mmol/L Final   Potassium 01/28/2024 3.3 (L)  3.5 - 5.1 mmol/L Final   Chloride  01/28/2024 107  98 - 111 mmol/L Final   CO2 01/28/2024 23  22 - 32 mmol/L Final   Glucose, Bld 01/28/2024 102 (H)  70 - 99 mg/dL Final   Glucose reference range applies only to samples taken after fasting for at least 8 hours.   BUN 01/28/2024 19  6 - 20 mg/dL Final   Creatinine, Ser 01/28/2024 0.96  0.61 - 1.24 mg/dL Final   Calcium  01/28/2024 8.6 (L)  8.9 - 10.3 mg/dL Final   Total Protein 94/89/7974 6.8  6.5 - 8.1 g/dL Final   Albumin 94/89/7974 3.9  3.5 - 5.0 g/dL Final   AST 94/89/7974 39  15 - 41 U/L Final   ALT 01/28/2024 47 (H)  0 - 44 U/L Final   Alkaline Phosphatase 01/28/2024 52  38 - 126 U/L Final   Total Bilirubin 01/28/2024 0.6  0.0 - 1.2 mg/dL Final   GFR, Estimated 01/28/2024 >60  >60 mL/min Final   Comment: (NOTE) Calculated using the CKD-EPI Creatinine Equation (2021)    Anion gap 01/28/2024 8  5 - 15 Final   Performed  at [facility], [address] [place], [place] [address]   Alcohol, Ethyl (B) 01/28/2024 <15  <15 mg/dL Final   Comment: Please note change in reference range. (NOTE) For medical purposes only. Performed at [facility], [address], [place], [place] [address]    WBC 01/28/2024 5.8  4.0 - 10.5 K/uL Final   RBC 01/28/2024 3.81 (L)  4.22 - 5.81 MIL/uL Final   Hemoglobin 01/28/2024 11.1 (L)  13.0 - 17.0 g/dL Final   HCT 94/89/7974 33.8 (L)  39.0 - 52.0 % Final   MCV 01/28/2024 88.7  80.0 - 100.0 fL Final   MCH 01/28/2024 29.1  26.0 - 34.0 pg Final   MCHC 01/28/2024 32.8  30.0 - 36.0 g/dL Final   RDW 94/89/7974 14.4  11.5 - 15.5 % Final   Platelets 01/28/2024 215  150 - 400 K/uL Final   nRBC 01/28/2024 0.0  0.0 - 0.2 % Final   Performed at [facility], [address], [place], [place] [address]   Opiates 01/28/2024 NONE DETECTED  NONE DETECTED Final   Cocaine 01/28/2024 POSITIVE (A)  NONE DETECTED Final   Benzodiazepines 01/28/2024 POSITIVE (A)  NONE DETECTED Final   Amphetamines  01/28/2024 POSITIVE (A)  NONE DETECTED Final   Comment: (NOTE) Trazodone  is metabolized in vivo to several metabolites, including pharmacologically active m-CPP, which is excreted in the urine. Immunoassay screens for amphetamines and MDMA have potential cross-reactivity with these compounds and may provide false positive  results.     Tetrahydrocannabinol 01/28/2024 POSITIVE (A)  NONE DETECTED Final   Barbiturates 01/28/2024 NONE DETECTED  NONE DETECTED Final   Comment: (NOTE) DRUG SCREEN FOR MEDICAL PURPOSES ONLY.  IF CONFIRMATION IS NEEDED FOR ANY PURPOSE, NOTIFY LAB WITHIN 5 DAYS.  LOWEST DETECTABLE LIMITS FOR URINE DRUG SCREEN Drug Class                     Cutoff (ng/mL) Amphetamine and metabolites    1000 Barbiturate and metabolites    200 Benzodiazepine                 200 Opiates and metabolites        300 Cocaine and metabolites        300 THC                            50 Performed at [facility], [address], [place], [place] [address]    Salicylate Lvl 01/28/2024 <7.0 (L)  7.0 - 30.0 mg/dL Final   Performed at [facility], [address] [place], [place] [address]   Acetaminophen  (Tylenol ), Serum 01/28/2024 <10 (L)  10 - 30 ug/mL Final   Comment: (NOTE) Therapeutic concentrations vary significantly. A range of 10-30 ug/mL  may be an effective concentration for many patients. However, some  are best treated at concentrations outside of this range. Acetaminophen  concentrations >150 ug/mL at 4 hours after ingestion  and >50 ug/mL at 12 hours after ingestion are often associated with  toxic reactions.  Performed at [facility], [address], [place], [place] [address]   Admission on 01/12/2024, Discharged on 01/13/2024  Component Date Value Ref Range Status   Sodium 01/12/2024 140  135 - 145 mmol/L Final   Potassium 01/12/2024 3.5  3.5 - 5.1 mmol/L Final   Chloride 01/12/2024 105  98 - 111 mmol/L  Final  CO2 01/12/2024 23  22 - 32 mmol/L Final   Glucose, Bld 01/12/2024 163 (H)  70 - 99 mg/dL Final   Glucose reference range applies only to samples taken after fasting for at least 8 hours.   BUN 01/12/2024 15  6 - 20 mg/dL Final   Creatinine, Ser 01/12/2024 1.21  0.61 - 1.24 mg/dL Final   Calcium  01/12/2024 9.8  8.9 - 10.3 mg/dL Final   Total Protein 95/75/7974 7.1  6.5 - 8.1 g/dL Final   Albumin 95/75/7974 4.2  3.5 - 5.0 g/dL Final   AST 95/75/7974 26  15 - 41 U/L Final   ALT 01/12/2024 24  0 - 44 U/L Final   Alkaline Phosphatase 01/12/2024 54  38 - 126 U/L Final   Total Bilirubin 01/12/2024 0.6  0.0 - 1.2 mg/dL Final   GFR, Estimated 01/12/2024 >60  >60 mL/min Final   Comment: (NOTE) Calculated using the CKD-EPI Creatinine Equation (2021)    Anion gap 01/12/2024 12  5 - 15 Final   Performed at [facility], [address], [place], [place] [address]   Alcohol, Ethyl (B) 01/12/2024 <15  <15 mg/dL Final   Comment: Please note change in reference range. (NOTE) For medical purposes only. Performed at [facility], [address] [place], [place] [address]    WBC 01/12/2024 11.9 (H)  4.0 - 10.5 K/uL Final   RBC 01/12/2024 5.30  4.22 - 5.81 MIL/uL Final   Hemoglobin 01/12/2024 15.3  13.0 - 17.0 g/dL Final   HCT 95/75/7974 46.8  39.0 - 52.0 % Final   MCV 01/12/2024 88.3  80.0 - 100.0 fL Final   MCH 01/12/2024 28.9  26.0 - 34.0 pg Final   MCHC 01/12/2024 32.7  30.0 - 36.0 g/dL Final   RDW 95/75/7974 13.5  11.5 - 15.5 % Final   Platelets 01/12/2024 307  150 - 400 K/uL Final   nRBC 01/12/2024 0.0  0.0 - 0.2 % Final   Performed at [facility], [address], [place], [place] [address]   Opiates 01/12/2024 NONE DETECTED  NONE DETECTED Final   Cocaine 01/12/2024 NONE DETECTED  NONE DETECTED Final   Benzodiazepines 01/12/2024 NONE DETECTED  NONE DETECTED Final   Amphetamines 01/12/2024 POSITIVE (A)  NONE DETECTED Final   Comment: (NOTE) Trazodone  is  metabolized in vivo to several metabolites, including pharmacologically active m-CPP, which is excreted in the urine. Immunoassay screens for amphetamines and MDMA have potential cross-reactivity with these compounds and may provide false positive  results.     Tetrahydrocannabinol 01/12/2024 POSITIVE (A)  NONE DETECTED Final   Barbiturates 01/12/2024 NONE DETECTED  NONE DETECTED Final   Comment: (NOTE) DRUG SCREEN FOR MEDICAL PURPOSES ONLY.  IF CONFIRMATION IS NEEDED FOR ANY PURPOSE, NOTIFY LAB WITHIN 5 DAYS.  LOWEST DETECTABLE LIMITS FOR URINE DRUG SCREEN Drug Class                     Cutoff (ng/mL) Amphetamine and metabolites    1000 Barbiturate and metabolites    200 Benzodiazepine                 200 Opiates and metabolites        300 Cocaine and metabolites        300 THC                            50 Performed at [facility], [address] [place], [place] [address]     Allergies: Penicillins and Augmentin [amoxicillin-pot clavulanate]  Medications:  Facility Ordered Medications  Medication   [COMPLETED] OLANZapine  (ZYPREXA ) tablet 5 mg   [START ON 06/14/2024] FLUoxetine  (PROZAC ) capsule 20 mg   OLANZapine  (ZYPREXA ) tablet 5 mg   traZODone  (DESYREL ) tablet 50 mg   alum & mag hydroxide-simeth (MAALOX/MYLANTA) 200-200-20 MG/5ML suspension 30 mL   magnesium  hydroxide (MILK OF MAGNESIA) suspension 30 mL   haloperidol  (HALDOL ) tablet 5 mg   And   diphenhydrAMINE  (BENADRYL ) capsule 50 mg   haloperidol  lactate (HALDOL ) injection 5 mg   And   diphenhydrAMINE  (BENADRYL ) injection 50 mg   And   LORazepam  (ATIVAN ) injection 2 mg   haloperidol  lactate (HALDOL ) injection 10 mg   And   diphenhydrAMINE  (BENADRYL ) injection 50 mg   And   LORazepam  (ATIVAN ) injection 2 mg   dicyclomine  (BENTYL ) tablet 20 mg   hydrOXYzine  (ATARAX ) tablet 25 mg   loperamide  (IMODIUM ) capsule 2-4 mg   methocarbamol  (ROBAXIN ) tablet 500 mg   naproxen  (NAPROSYN ) tablet 500 mg    ondansetron  (ZOFRAN -ODT) disintegrating tablet 4 mg   PTA Medications  Medication Sig   nicotine  (NICODERM CQ  - DOSED IN MG/24 HOURS) 14 mg/24hr patch Place 1 patch (14 mg total) onto the skin daily. (Patient not taking: Reported on 06/13/2024)   ALPRAZolam  (XANAX ) 0.5 MG tablet Take 0.5 mg by mouth 3 (three) times daily as needed for anxiety. (Patient not taking: Reported on 06/13/2024)   amLODipine  (NORVASC ) 10 MG tablet Take 1 tablet (10 mg total) by mouth daily. (Patient not taking: Reported on 06/13/2024)   escitalopram  (LEXAPRO ) 10 MG tablet Take 1 tablet (10 mg total) by mouth daily. (Patient not taking: Reported on 06/13/2024)   OLANZapine  (ZYPREXA ) 10 MG tablet Take 1 tablet (10 mg total) by mouth at bedtime. (Patient not taking: Reported on 06/13/2024)   FLUoxetine  (PROZAC ) 10 MG capsule Take 10 mg by mouth daily. (Patient not taking: Reported on 06/13/2024)    Long Term Goals: Improvement in symptoms so as ready for discharge  Short Term Goals: Patient will verbalize feelings in meetings with treatment team members., Patient will attend at least of 50% of the groups daily., Pt will complete the PHQ9 on admission, day 3 and discharge., Patient will participate in completing the [place] Suicide Severity Rating Scale, Patient will score a low risk of violence for 24 hours prior to discharge, and Patient will take medications as prescribed daily.  Medical Decision Making  Patient admitted directly to the [facility] from [facility] for substance abuse treatment.    I reviewed the labs and medications completed at the ED:The pertinent results include: CBC unremarkable.  CMP unremarkable.  EtOH is negative. Urine drug screen positive for benzos, amphetamine and THC  Medications started at the ED - Prozac  20 mg p.o. daily for depressive symptoms - Olanzapine  5 mg p.o. twice daily for AH/mood  Recommend COWS protocol-substance use disorder. EKG ordered  Other PRNs - Agitation protocol medications -  MOM, Maalox, trazodone   Recommendations  Based on my evaluation the patient does not appear to have an emergency medical condition.  Patient admitted directly to the [facility] from [facility] for substance abuse treatment.   Thurman LULLA Ivans, NP 06/13/24  11:10 PM

## 2024-06-13 NOTE — ED Notes (Signed)
 Pt ambulatory to lobby, A&Ox4, Belongings bags and document folder sealed and provided to safe transporter Kent to go to [facility]. Receiving RN Fidela informed pt en route. Transfer form electronically signed. Emtala and document reviewed by charge RN Delon.

## 2024-06-13 NOTE — ED Notes (Signed)
 Pt. Belongings placed in locker #7. 3 bags total w/ pt. Sticker on each bag.

## 2024-06-13 NOTE — ED Notes (Signed)
 Attempted to call report to [facility] for patient to transfer. Per Diplomatic Services operational officer, both nurses occupied right now, requested call back.

## 2024-06-14 DIAGNOSIS — F1310 Sedative, hypnotic or anxiolytic abuse, uncomplicated: Secondary | ICD-10-CM | POA: Diagnosis not present

## 2024-06-14 DIAGNOSIS — F1529 Other stimulant dependence with unspecified stimulant-induced disorder: Secondary | ICD-10-CM | POA: Diagnosis not present

## 2024-06-14 DIAGNOSIS — F1210 Cannabis abuse, uncomplicated: Secondary | ICD-10-CM | POA: Diagnosis not present

## 2024-06-14 DIAGNOSIS — F411 Generalized anxiety disorder: Secondary | ICD-10-CM | POA: Diagnosis not present

## 2024-06-14 NOTE — ED Notes (Signed)
Patient asleep NAD

## 2024-06-14 NOTE — ED Notes (Signed)
 Pt sleeping in no acute distress. RR even and unlabored. Environment secured. Will continue to monitor for safety.

## 2024-06-14 NOTE — ED Notes (Signed)
Pt A&O x 4, no distress noted., calm & cooperative, monitoring for safety. 

## 2024-06-14 NOTE — ED Provider Notes (Signed)
 Behavioral Health Progress Note  Date and Time: 06/14/2024 1:14 PM Name: Trevor [name] MRN:  [id number]  Subjective:  Trevor [name] is a 43 year old male with a psychiatric history of bipolar 1 disorder, GAD, homelessness, narcotic ideation, poorly substance abuse-cocaine, tobacco, methamphetamine, cannabis, psychosis, and suicidal ideations, who was admitted to the [facility] with c/o SI no plan or intent and substance abuse (methamphetamine use). UDS positive for amphetamine, benzodiazepine and THC. BAL negative. He endorsed continuous methamphetamine abuse for the past 10 years, currently using about a gram a day, stating I shoot it up.  Last used this a.m. He endorsed daily marijuana use. He denied other illicit substance use. Patient endorsed suicidal thoughts, no plan for couple of years. He endorsed auditory hallucinations of voices mumbling.    On evaluation, patient is alert and oriented x 4. Thought process is linear and goal oriented. Thought content is negative for SI/HI/AVH. Objectively, no signs of acute psychosis. His speech is clear and coherent. He describes his mood as depressed and anxious and affect is flat. He is calm and cooperative and does not appear to be in acute distress. He states that he has been using methamphetamines for the past 10 years, via IV use, on average $10-$20. He also reports daily marijuana use. He was informed that his urine drug screen showed positive for benzodiazepines. He reports getting benzodiazepines, Xanax  from off the street for the past year and states that he takes about 1-2 Xanax  daily and the last time he used was 1 week ago. He denies withdrawal symptoms. Will add CIWA to monitor for benzodiazepine withdrawal. He describes his mood as feeling irritable, sad, hopeless, worthless, anxious, and isolating. He reports good sleep. He reports a good appetite. He denies suicidal thoughts today. He denies a  history of past suicide attempts. He denies auditory or visual hallucinations today. He denies paranoia. He denies physical complaints. He denies medication side effects. He is interested in long-term residential treatment.  Diagnosis:  Final diagnoses:  Methamphetamine use disorder, severe, dependence (HCC)  Suicidal ideation  Substance induced mood disorder (HCC)  Cannabis abuse, continuous use  Homelessness unspecified  Hallucination    Total Time spent with patient: 30 minutes  Past Psychiatric History: A psychiatric history of bipolar 1 disorder, GAD, homelessness, narcotic ideation, poorly substance abuse-cocaine, tobacco, methamphetamine, cannabis, psychosis, and suicidal ideations. Per chart review, patient presented earlier to the ED today endorsing suicidal ideations and auditory hallucinations in the context of meth amphetamine dependence. Patient has history of repeated ED presentations and multiple prior psychiatric hospitalizations. He is seeking psychiatric admission and referral to long-term residential program. Patient reports a history of substance treatments/detox at [facility] 19 years ago.   Past Medical History: No reported history.   Family Psychiatric  History: No reported history.   Social History: He reports sheltered homelessness and states I stay with a friend.  Current Medications:  Current Facility-Administered Medications  Medication Dose Route Frequency Provider Last Rate Last Admin   alum & mag hydroxide-simeth (MAALOX/MYLANTA) 200-200-20 MG/5ML suspension 30 mL  30 mL Oral Q4H PRN Coleman, Carolyn H, NP       dicyclomine  (BENTYL ) tablet 20 mg  20 mg Oral Q6H PRN [name], Chinwendu V, NP       haloperidol  (HALDOL ) tablet 5 mg  5 mg Oral TID PRN Mardy Elveria DEL, NP       And   diphenhydrAMINE  (BENADRYL ) capsule 50 mg  50 mg Oral  TID PRN Coleman, Carolyn H, NP       haloperidol  lactate (HALDOL ) injection 5 mg  5 mg Intramuscular TID PRN Mardy Elveria DEL,  NP       And   diphenhydrAMINE  (BENADRYL ) injection 50 mg  50 mg Intramuscular TID PRN Mardy Elveria DEL, NP       And   LORazepam  (ATIVAN ) injection 2 mg  2 mg Intramuscular TID PRN Coleman, Carolyn H, NP       haloperidol  lactate (HALDOL ) injection 10 mg  10 mg Intramuscular TID PRN Mardy Elveria DEL, NP       And   diphenhydrAMINE  (BENADRYL ) injection 50 mg  50 mg Intramuscular TID PRN Mardy Elveria DEL, NP       And   LORazepam  (ATIVAN ) injection 2 mg  2 mg Intramuscular TID PRN Mardy Elveria DEL, NP       FLUoxetine  (PROZAC ) capsule 20 mg  20 mg Oral Daily Coleman, Carolyn H, NP   20 mg at 06/14/24 9062   hydrOXYzine  (ATARAX ) tablet 25 mg  25 mg Oral Q6H PRN [name], Chinwendu V, NP       loperamide  (IMODIUM ) capsule 2-4 mg  2-4 mg Oral PRN [name], Chinwendu V, NP       magnesium  hydroxide (MILK OF MAGNESIA) suspension 30 mL  30 mL Oral Daily PRN Coleman, Carolyn H, NP       methocarbamol  (ROBAXIN ) tablet 500 mg  500 mg Oral Q8H PRN [name], Chinwendu V, NP       naproxen  (NAPROSYN ) tablet 500 mg  500 mg Oral BID PRN [name], Chinwendu V, NP       OLANZapine  (ZYPREXA ) tablet 5 mg  5 mg Oral BID Coleman, Carolyn H, NP   5 mg at 06/14/24 9062   ondansetron  (ZOFRAN -ODT) disintegrating tablet 4 mg  4 mg Oral Q6H PRN [name], Chinwendu V, NP       traZODone  (DESYREL ) tablet 50 mg  50 mg Oral QHS PRN Mardy Elveria DEL, NP       Current Outpatient Medications  Medication Sig Dispense Refill   ALPRAZolam  (XANAX ) 0.5 MG tablet Take 0.5 mg by mouth 3 (three) times daily as needed for anxiety. (Patient not taking: Reported on 06/13/2024)     amLODipine  (NORVASC ) 10 MG tablet Take 1 tablet (10 mg total) by mouth daily. (Patient not taking: Reported on 06/13/2024) 30 tablet 0   escitalopram  (LEXAPRO ) 10 MG tablet Take 1 tablet (10 mg total) by mouth daily. (Patient not taking: Reported on 06/13/2024) 30 tablet 0   FLUoxetine  (PROZAC ) 10 MG capsule Take 10 mg by mouth daily. (Patient not taking:  Reported on 06/13/2024)     nicotine  (NICODERM CQ  - DOSED IN MG/24 HOURS) 14 mg/24hr patch Place 1 patch (14 mg total) onto the skin daily. (Patient not taking: Reported on 06/13/2024) 28 patch 0   OLANZapine  (ZYPREXA ) 10 MG tablet Take 1 tablet (10 mg total) by mouth at bedtime. (Patient not taking: Reported on 06/13/2024) 30 tablet 0    Labs  Lab Results:  Admission on 06/13/2024, Discharged on 06/13/2024  Component Date Value Ref Range Status   Sodium 06/13/2024 137  135 - 145 mmol/L Final   Potassium 06/13/2024 3.8  3.5 - 5.1 mmol/L Final   Chloride 06/13/2024 102  98 - 111 mmol/L Final   CO2 06/13/2024 26  22 - 32 mmol/L Final   Glucose, Bld 06/13/2024 106 (H)  70 - 99 mg/dL Final   Glucose reference range applies only  to samples taken after fasting for at least 8 hours.   BUN 06/13/2024 9  6 - 20 mg/dL Final   Creatinine, Ser 06/13/2024 0.96  0.61 - 1.24 mg/dL Final   Calcium  06/13/2024 9.4  8.9 - 10.3 mg/dL Final   Total Protein 90/75/7974 7.4  6.5 - 8.1 g/dL Final   Albumin 90/75/7974 3.9  3.5 - 5.0 g/dL Final   AST 90/75/7974 21  15 - 41 U/L Final   ALT 06/13/2024 15  0 - 44 U/L Final   Alkaline Phosphatase 06/13/2024 57  38 - 126 U/L Final   Total Bilirubin 06/13/2024 0.6  0.0 - 1.2 mg/dL Final   GFR, Estimated 06/13/2024 >60  >60 mL/min Final   Comment: (NOTE) Calculated using the CKD-EPI Creatinine Equation (2021)    Anion gap 06/13/2024 9  5 - 15 Final   Performed at [facility], [address] [place], [place] [address]   Alcohol, Ethyl (B) 06/13/2024 <15  <15 mg/dL Final   Comment: (NOTE) For medical purposes only. Performed at [facility], [address], [place], [place] [address]    WBC 06/13/2024 8.5  4.0 - 10.5 K/uL Final   RBC 06/13/2024 4.80  4.22 - 5.81 MIL/uL Final   Hemoglobin 06/13/2024 14.2  13.0 - 17.0 g/dL Final   HCT 90/75/7974 42.6  39.0 - 52.0 % Final   MCV 06/13/2024 88.8  80.0 - 100.0 fL Final   MCH 06/13/2024 29.6  26.0 - 34.0 pg  Final   MCHC 06/13/2024 33.3  30.0 - 36.0 g/dL Final   RDW 90/75/7974 12.7  11.5 - 15.5 % Final   Platelets 06/13/2024 314  150 - 400 K/uL Final   nRBC 06/13/2024 0.0  0.0 - 0.2 % Final   Performed at [facility], [address] [place], [place] [address]   Opiates 06/13/2024 NONE DETECTED  NONE DETECTED Final   Cocaine 06/13/2024 NONE DETECTED  NONE DETECTED Final   Benzodiazepines 06/13/2024 POSITIVE (A)  NONE DETECTED Final   Amphetamines 06/13/2024 POSITIVE (A)  NONE DETECTED Final   Comment: (NOTE) Trazodone  is metabolized in vivo to several metabolites, including pharmacologically active m-CPP, which is excreted in the urine. Immunoassay screens for amphetamines and MDMA have potential cross-reactivity with these compounds and may provide false positive  results.     Tetrahydrocannabinol 06/13/2024 POSITIVE (A)  NONE DETECTED Final   Barbiturates 06/13/2024 NONE DETECTED  NONE DETECTED Final   Comment: (NOTE) DRUG SCREEN FOR MEDICAL PURPOSES ONLY.  IF CONFIRMATION IS NEEDED FOR ANY PURPOSE, NOTIFY LAB WITHIN 5 DAYS.  LOWEST DETECTABLE LIMITS FOR URINE DRUG SCREEN Drug Class                     Cutoff (ng/mL) Amphetamine and metabolites    1000 Barbiturate and metabolites    200 Benzodiazepine                 200 Opiates and metabolites        300 Cocaine and metabolites        300 THC                            50 Performed at [facility], [address], [place], [place] [address]   Admission on 05/04/2024, Discharged on 05/06/2024  Component Date Value Ref Range Status   Sodium 05/05/2024 137  135 - 145 mmol/L Final  Potassium 05/05/2024 2.8 (L)  3.5 - 5.1 mmol/L Final   Chloride 05/05/2024 104  98 - 111 mmol/L Final   CO2 05/05/2024 22  22 - 32 mmol/L Final   Glucose, Bld 05/05/2024 127 (H)  70 - 99 mg/dL Final   Glucose reference range applies only to samples taken after fasting for at least 8 hours.   BUN 05/05/2024 25 (H)  6 - 20 mg/dL Final    Creatinine, Ser 05/05/2024 1.24  0.61 - 1.24 mg/dL Final   Calcium  05/05/2024 9.2  8.9 - 10.3 mg/dL Final   Total Protein 91/83/7974 7.7  6.5 - 8.1 g/dL Final   Albumin 91/83/7974 4.1  3.5 - 5.0 g/dL Final   AST 91/83/7974 26  15 - 41 U/L Final   ALT 05/05/2024 17  0 - 44 U/L Final   Alkaline Phosphatase 05/05/2024 58  38 - 126 U/L Final   Total Bilirubin 05/05/2024 1.5 (H)  0.0 - 1.2 mg/dL Final   GFR, Estimated 05/05/2024 >60  >60 mL/min Final   Comment: (NOTE) Calculated using the CKD-EPI Creatinine Equation (2021)    Anion gap 05/05/2024 11  5 - 15 Final   Performed at [facility], [address] [place], [place] [address]   Alcohol, Ethyl (B) 05/05/2024 <15  <15 mg/dL Final   Comment: (NOTE) For medical purposes only. Performed at [facility], [address], [place], [place] [address]    WBC 05/05/2024 5.9  4.0 - 10.5 K/uL Final   RBC 05/05/2024 4.48  4.22 - 5.81 MIL/uL Final   Hemoglobin 05/05/2024 13.2  13.0 - 17.0 g/dL Final   HCT 91/83/7974 40.3  39.0 - 52.0 % Final   MCV 05/05/2024 90.0  80.0 - 100.0 fL Final   MCH 05/05/2024 29.5  26.0 - 34.0 pg Final   MCHC 05/05/2024 32.8  30.0 - 36.0 g/dL Final   RDW 91/83/7974 13.4  11.5 - 15.5 % Final   Platelets 05/05/2024 257  150 - 400 K/uL Final   nRBC 05/05/2024 0.0  0.0 - 0.2 % Final   Performed at [facility], [address] [place], [place] [address]   Opiates 05/05/2024 NONE DETECTED  NONE DETECTED Final   Cocaine 05/05/2024 NONE DETECTED  NONE DETECTED Final   Benzodiazepines 05/05/2024 POSITIVE (A)  NONE DETECTED Final   Amphetamines 05/05/2024 POSITIVE (A)  NONE DETECTED Final   Comment: (NOTE) Trazodone  is metabolized in vivo to several metabolites, including pharmacologically active m-CPP, which is excreted in the urine. Immunoassay screens for amphetamines and MDMA have potential cross-reactivity with these compounds and may provide false positive   results.     Tetrahydrocannabinol 05/05/2024 POSITIVE (A)  NONE DETECTED Final   Barbiturates 05/05/2024 NONE DETECTED  NONE DETECTED Final   Comment: (NOTE) DRUG SCREEN FOR MEDICAL PURPOSES ONLY.  IF CONFIRMATION IS NEEDED FOR ANY PURPOSE, NOTIFY LAB WITHIN 5 DAYS.  LOWEST DETECTABLE LIMITS FOR URINE DRUG SCREEN Drug Class                     Cutoff (ng/mL) Amphetamine and metabolites    1000 Barbiturate and metabolites    200 Benzodiazepine                 200 Opiates and metabolites        300 Cocaine and metabolites        300 THC  50 Performed at [facility], [address], [place], [place] [address]   Admission on 03/15/2024, Discharged on 03/16/2024  Component Date Value Ref Range Status   Sodium 03/15/2024 138  135 - 145 mmol/L Final   Potassium 03/15/2024 3.7  3.5 - 5.1 mmol/L Final   Chloride 03/15/2024 104  98 - 111 mmol/L Final   CO2 03/15/2024 24  22 - 32 mmol/L Final   Glucose, Bld 03/15/2024 83  70 - 99 mg/dL Final   Glucose reference range applies only to samples taken after fasting for at least 8 hours.   BUN 03/15/2024 20  6 - 20 mg/dL Final   Creatinine, Ser 03/15/2024 0.86  0.61 - 1.24 mg/dL Final   Calcium  03/15/2024 9.2  8.9 - 10.3 mg/dL Final   Total Protein 93/73/7974 6.7  6.5 - 8.1 g/dL Final   Albumin 93/73/7974 3.9  3.5 - 5.0 g/dL Final   AST 93/73/7974 19  15 - 41 U/L Final   ALT 03/15/2024 17  0 - 44 U/L Final   Alkaline Phosphatase 03/15/2024 45  38 - 126 U/L Final   Total Bilirubin 03/15/2024 0.8  0.0 - 1.2 mg/dL Final   GFR, Estimated 03/15/2024 >60  >60 mL/min Final   Comment: (NOTE) Calculated using the CKD-EPI Creatinine Equation (2021)    Anion gap 03/15/2024 10  5 - 15 Final   Performed at [facility], [address] [place], [place] [address]   Alcohol, Ethyl (B) 03/15/2024 <15  <15 mg/dL Final   Comment: (NOTE) For medical purposes only. Performed at [facility], [address], [place], [place] [address]    Opiates 03/15/2024 NONE DETECTED  NONE DETECTED Final   Cocaine 03/15/2024 NONE DETECTED  NONE DETECTED Final   Benzodiazepines 03/15/2024 NONE DETECTED  NONE DETECTED Final   Amphetamines 03/15/2024 POSITIVE (A)  NONE DETECTED Final   Comment: (NOTE) Trazodone  is metabolized in vivo to several metabolites, including pharmacologically active m-CPP, which is excreted in the urine. Immunoassay screens for amphetamines and MDMA have potential cross-reactivity with these compounds and may provide false positive  results.     Tetrahydrocannabinol 03/15/2024 POSITIVE (A)  NONE DETECTED Final   Barbiturates 03/15/2024 NONE DETECTED  NONE DETECTED Final   Comment: (NOTE) DRUG SCREEN FOR MEDICAL PURPOSES ONLY.  IF CONFIRMATION IS NEEDED FOR ANY PURPOSE, NOTIFY LAB WITHIN 5 DAYS.  LOWEST DETECTABLE LIMITS FOR URINE DRUG SCREEN Drug Class                     Cutoff (ng/mL) Amphetamine and metabolites    1000 Barbiturate and metabolites    200 Benzodiazepine                 200 Opiates and metabolites        300 Cocaine and metabolites        300 THC                            50 Performed at [facility], [address], [place], [place] [address]    WBC 03/15/2024 6.7  4.0 - 10.5 K/uL Final   RBC 03/15/2024 4.59  4.22 - 5.81 MIL/uL Final   Hemoglobin 03/15/2024 13.5  13.0 - 17.0 g/dL Final   HCT 93/73/7974 39.9  39.0 - 52.0 % Final   MCV 03/15/2024 86.9  80.0 - 100.0 fL Final   MCH  03/15/2024 29.4  26.0 - 34.0 pg Final   MCHC 03/15/2024 33.8  30.0 - 36.0 g/dL Final   RDW 93/73/7974 13.9  11.5 - 15.5 % Final   Platelets 03/15/2024 281  150 - 400 K/uL Final   nRBC 03/15/2024 0.0  0.0 - 0.2 % Final   Neutrophils Relative % 03/15/2024 60  % Final   Neutro Abs 03/15/2024 4.0  1.7 - 7.7 K/uL Final   Lymphocytes Relative 03/15/2024 32  % Final   Lymphs Abs 03/15/2024 2.1  0.7 - 4.0 K/uL Final   Monocytes Relative 03/15/2024 6  %  Final   Monocytes Absolute 03/15/2024 0.4  0.1 - 1.0 K/uL Final   Eosinophils Relative 03/15/2024 1  % Final   Eosinophils Absolute 03/15/2024 0.1  0.0 - 0.5 K/uL Final   Basophils Relative 03/15/2024 1  % Final   Basophils Absolute 03/15/2024 0.0  0.0 - 0.1 K/uL Final   Immature Granulocytes 03/15/2024 0  % Final   Abs Immature Granulocytes 03/15/2024 0.02  0.00 - 0.07 K/uL Final   Performed at [facility], [address], [place], [place] [address]   Acetaminophen  (Tylenol ), Serum 03/15/2024 <10 (L)  10 - 30 ug/mL Final   Comment: (NOTE) Therapeutic concentrations vary significantly. A range of 10-30 ug/mL  may be an effective concentration for many patients. However, some  are best treated at concentrations outside of this range. Acetaminophen  concentrations >150 ug/mL at 4 hours after ingestion  and >50 ug/mL at 12 hours after ingestion are often associated with  toxic reactions.  Performed at [facility], [address], [place], [place] [address]    Salicylate Lvl 03/15/2024 <7.0 (L)  7.0 - 30.0 mg/dL Final   Performed at [facility], [address], [place], [place] [address]  Admission on 03/12/2024, Discharged on 03/12/2024  Component Date Value Ref Range Status   Sodium 03/12/2024 139  135 - 145 mmol/L Final   Potassium 03/12/2024 3.2 (L)  3.5 - 5.1 mmol/L Final   Chloride 03/12/2024 105  98 - 111 mmol/L Final   CO2 03/12/2024 25  22 - 32 mmol/L Final   Glucose, Bld 03/12/2024 105 (H)  70 - 99 mg/dL Final   Glucose reference range applies only to samples taken after fasting for at least 8 hours.   BUN 03/12/2024 25 (H)  6 - 20 mg/dL Final   Creatinine, Ser 03/12/2024 0.87  0.61 - 1.24 mg/dL Final   Calcium  03/12/2024 8.9  8.9 - 10.3 mg/dL Final   Total Protein 93/76/7974 7.3  6.5 - 8.1 g/dL Final   Albumin 93/76/7974 4.2  3.5 - 5.0 g/dL Final   AST 93/76/7974 21  15 - 41 U/L Final   ALT 03/12/2024 17  0 - 44 U/L Final   Alkaline Phosphatase  03/12/2024 44  38 - 126 U/L Final   Total Bilirubin 03/12/2024 1.2  0.0 - 1.2 mg/dL Final   GFR, Estimated 03/12/2024 >60  >60 mL/min Final   Comment: (NOTE) Calculated using the CKD-EPI Creatinine Equation (2021)    Anion gap 03/12/2024 9  5 - 15 Final   Performed at [facility], [address] [place], [place] [address]   Alcohol, Ethyl (B) 03/12/2024 <15  <15 mg/dL Final   Comment: (NOTE) For medical purposes only. Performed at [facility], [address] [place], [place] [address]    WBC 03/12/2024 7.2  4.0 - 10.5 K/uL Final  RBC 03/12/2024 4.31  4.22 - 5.81 MIL/uL Final   Hemoglobin 03/12/2024 12.6 (L)  13.0 - 17.0 g/dL Final   HCT 93/76/7974 38.0 (L)  39.0 - 52.0 % Final   MCV 03/12/2024 88.2  80.0 - 100.0 fL Final   MCH 03/12/2024 29.2  26.0 - 34.0 pg Final   MCHC 03/12/2024 33.2  30.0 - 36.0 g/dL Final   RDW 93/76/7974 14.6  11.5 - 15.5 % Final   Platelets 03/12/2024 249  150 - 400 K/uL Final   nRBC 03/12/2024 0.0  0.0 - 0.2 % Final   Performed at [facility], [address], [place], [place] [address]   Opiates 03/12/2024 NONE DETECTED  NONE DETECTED Final   Cocaine 03/12/2024 NONE DETECTED  NONE DETECTED Final   Benzodiazepines 03/12/2024 NONE DETECTED  NONE DETECTED Final   Amphetamines 03/12/2024 POSITIVE (A)  NONE DETECTED Final   Comment: (NOTE) Trazodone  is metabolized in vivo to several metabolites, including pharmacologically active m-CPP, which is excreted in the urine. Immunoassay screens for amphetamines and MDMA have potential cross-reactivity with these compounds and may provide false positive  results.     Tetrahydrocannabinol 03/12/2024 POSITIVE (A)  NONE DETECTED Final   Barbiturates 03/12/2024 NONE DETECTED  NONE DETECTED Final   Comment: (NOTE) DRUG SCREEN FOR MEDICAL PURPOSES ONLY.  IF CONFIRMATION IS NEEDED FOR ANY PURPOSE, NOTIFY LAB WITHIN 5 DAYS.  LOWEST DETECTABLE LIMITS FOR  URINE DRUG SCREEN Drug Class                     Cutoff (ng/mL) Amphetamine and metabolites    1000 Barbiturate and metabolites    200 Benzodiazepine                 200 Opiates and metabolites        300 Cocaine and metabolites        300 THC                            50 Performed at [facility], [address] [place], [place] [address]   Admission on 02/24/2024, Discharged on 02/25/2024  Component Date Value Ref Range Status   Sodium 02/24/2024 137  135 - 145 mmol/L Final   Potassium 02/24/2024 4.2  3.5 - 5.1 mmol/L Final   Chloride 02/24/2024 101  98 - 111 mmol/L Final   CO2 02/24/2024 29  22 - 32 mmol/L Final   Glucose, Bld 02/24/2024 95  70 - 99 mg/dL Final   Glucose reference range applies only to samples taken after fasting for at least 8 hours.   BUN 02/24/2024 8  6 - 20 mg/dL Final   Creatinine, Ser 02/24/2024 0.95  0.61 - 1.24 mg/dL Final   Calcium  02/24/2024 9.3  8.9 - 10.3 mg/dL Final   Total Protein 93/93/7974 7.2  6.5 - 8.1 g/dL Final   Albumin 93/93/7974 4.0  3.5 - 5.0 g/dL Final   AST 93/93/7974 23  15 - 41 U/L Final   ALT 02/24/2024 19  0 - 44 U/L Final   Alkaline Phosphatase 02/24/2024 52  38 - 126 U/L Final   Total Bilirubin 02/24/2024 0.6  0.0 - 1.2 mg/dL Final   GFR, Estimated 02/24/2024 >60  >60 mL/min Final   Comment: (NOTE) Calculated using the CKD-EPI Creatinine Equation (2021)    Anion gap 02/24/2024 7  5 - 15 Final   Performed at [facility], [address] [place], [place] [address]   Alcohol, Ethyl (B) 02/24/2024 <15  <15 mg/dL Final   Comment: (NOTE) For medical purposes only. Performed at [facility], [address] [place], [place] [address]    Opiates 02/24/2024 NONE DETECTED  NONE DETECTED Final   Cocaine 02/24/2024 NONE DETECTED  NONE DETECTED Final   Benzodiazepines 02/24/2024 POSITIVE (A)  NONE DETECTED Final   Amphetamines 02/24/2024 POSITIVE (A)  NONE DETECTED Final   Comment: (NOTE) Trazodone   is metabolized in vivo to several metabolites, including pharmacologically active m-CPP, which is excreted in the urine. Immunoassay screens for amphetamines and MDMA have potential cross-reactivity with these compounds and may provide false positive  results.     Tetrahydrocannabinol 02/24/2024 POSITIVE (A)  NONE DETECTED Final   Barbiturates 02/24/2024 NONE DETECTED  NONE DETECTED Final   Comment: (NOTE) DRUG SCREEN FOR MEDICAL PURPOSES ONLY.  IF CONFIRMATION IS NEEDED FOR ANY PURPOSE, NOTIFY LAB WITHIN 5 DAYS.  LOWEST DETECTABLE LIMITS FOR URINE DRUG SCREEN Drug Class                     Cutoff (ng/mL) Amphetamine and metabolites    1000 Barbiturate and metabolites    200 Benzodiazepine                 200 Opiates and metabolites        300 Cocaine and metabolites        300 THC                            50 Performed at [facility], [address], [place], [place] [address]    WBC 02/24/2024 5.7  4.0 - 10.5 K/uL Final   RBC 02/24/2024 5.10  4.22 - 5.81 MIL/uL Final   Hemoglobin 02/24/2024 15.0  13.0 - 17.0 g/dL Final   HCT 93/93/7974 45.0  39.0 - 52.0 % Final   MCV 02/24/2024 88.2  80.0 - 100.0 fL Final   MCH 02/24/2024 29.4  26.0 - 34.0 pg Final   MCHC 02/24/2024 33.3  30.0 - 36.0 g/dL Final   RDW 93/93/7974 13.8  11.5 - 15.5 % Final   Platelets 02/24/2024 268  150 - 400 K/uL Final   nRBC 02/24/2024 0.0  0.0 - 0.2 % Final   Neutrophils Relative % 02/24/2024 67  % Final   Neutro Abs 02/24/2024 3.9  1.7 - 7.7 K/uL Final   Lymphocytes Relative 02/24/2024 24  % Final   Lymphs Abs 02/24/2024 1.4  0.7 - 4.0 K/uL Final   Monocytes Relative 02/24/2024 6  % Final   Monocytes Absolute 02/24/2024 0.3  0.1 - 1.0 K/uL Final   Eosinophils Relative 02/24/2024 2  % Final   Eosinophils Absolute 02/24/2024 0.1  0.0 - 0.5 K/uL Final   Basophils Relative 02/24/2024 1  % Final   Basophils Absolute 02/24/2024 0.0  0.0 - 0.1 K/uL Final   Immature Granulocytes 02/24/2024 0  %  Final   Abs Immature Granulocytes 02/24/2024 0.01  0.00 - 0.07 K/uL Final   Performed at [facility], [address], [place], [place] [address]  Admission on 02/22/2024, Discharged on 02/22/2024  Component Date Value Ref Range Status   Sodium 02/22/2024 138  135 - 145 mmol/L Final   Potassium 02/22/2024 3.4 (L)  3.5 - 5.1 mmol/L Final   Chloride 02/22/2024 103  98 - 111 mmol/L Final  CO2 02/22/2024 27  22 - 32 mmol/L Final   Glucose, Bld 02/22/2024 112 (H)  70 - 99 mg/dL Final   Glucose reference range applies only to samples taken after fasting for at least 8 hours.   BUN 02/22/2024 10  6 - 20 mg/dL Final   Creatinine, Ser 02/22/2024 0.82  0.61 - 1.24 mg/dL Final   Calcium  02/22/2024 8.9  8.9 - 10.3 mg/dL Final   Total Protein 93/95/7974 7.0  6.5 - 8.1 g/dL Final   Albumin 93/95/7974 3.8  3.5 - 5.0 g/dL Final   AST 93/95/7974 22  15 - 41 U/L Final   ALT 02/22/2024 21  0 - 44 U/L Final   Alkaline Phosphatase 02/22/2024 53  38 - 126 U/L Final   Total Bilirubin 02/22/2024 0.8  0.0 - 1.2 mg/dL Final   GFR, Estimated 02/22/2024 >60  >60 mL/min Final   Comment: (NOTE) Calculated using the CKD-EPI Creatinine Equation (2021)    Anion gap 02/22/2024 8  5 - 15 Final   Performed at [facility], [address], [place], [place] [address]   WBC 02/22/2024 6.1  4.0 - 10.5 K/uL Final   RBC 02/22/2024 4.10 (L)  4.22 - 5.81 MIL/uL Final   Hemoglobin 02/22/2024 11.9 (L)  13.0 - 17.0 g/dL Final   HCT 93/95/7974 36.8 (L)  39.0 - 52.0 % Final   MCV 02/22/2024 89.8  80.0 - 100.0 fL Final   MCH 02/22/2024 29.0  26.0 - 34.0 pg Final   MCHC 02/22/2024 32.3  30.0 - 36.0 g/dL Final   RDW 93/95/7974 13.9  11.5 - 15.5 % Final   Platelets 02/22/2024 229  150 - 400 K/uL Final   nRBC 02/22/2024 0.0  0.0 - 0.2 % Final   Performed at [facility], [address] [place], [place] [address]  Admission on 01/29/2024, Discharged on 01/30/2024  Component Date Value  Ref Range Status   Sodium 01/29/2024 140  135 - 145 mmol/L Final   Potassium 01/29/2024 3.8  3.5 - 5.1 mmol/L Final   Chloride 01/29/2024 103  98 - 111 mmol/L Final   CO2 01/29/2024 25  22 - 32 mmol/L Final   Glucose, Bld 01/29/2024 108 (H)  70 - 99 mg/dL Final   Glucose reference range applies only to samples taken after fasting for at least 8 hours.   BUN 01/29/2024 9  6 - 20 mg/dL Final   Creatinine, Ser 01/29/2024 0.83  0.61 - 1.24 mg/dL Final   Calcium  01/29/2024 9.4  8.9 - 10.3 mg/dL Final   Total Protein 94/88/7974 7.0  6.5 - 8.1 g/dL Final   Albumin 94/88/7974 4.0  3.5 - 5.0 g/dL Final   AST 94/88/7974 27  15 - 41 U/L Final   ALT 01/29/2024 38  0 - 44 U/L Final   Alkaline Phosphatase 01/29/2024 54  38 - 126 U/L Final   Total Bilirubin 01/29/2024 0.9  0.0 - 1.2 mg/dL Final   GFR, Estimated 01/29/2024 >60  >60 mL/min Final   Comment: (NOTE) Calculated using the CKD-EPI Creatinine Equation (2021)    Anion gap 01/29/2024 12  5 - 15 Final   Performed at [facility], [address] [place], [place] [address]   WBC 01/29/2024 8.6  4.0 - 10.5 K/uL Final   RBC 01/29/2024 4.46  4.22 - 5.81 MIL/uL Final   Hemoglobin 01/29/2024 12.9 (L)  13.0 - 17.0 g/dL Final   HCT 94/88/7974 38.8 (L)  39.0 -  52.0 % Final   MCV 01/29/2024 87.0  80.0 - 100.0 fL Final   MCH 01/29/2024 28.9  26.0 - 34.0 pg Final   MCHC 01/29/2024 33.2  30.0 - 36.0 g/dL Final   RDW 94/88/7974 13.9  11.5 - 15.5 % Final   Platelets 01/29/2024 239  150 - 400 K/uL Final   nRBC 01/29/2024 0.0  0.0 - 0.2 % Final   Neutrophils Relative % 01/29/2024 90  % Final   Neutro Abs 01/29/2024 7.6  1.7 - 7.7 K/uL Final   Lymphocytes Relative 01/29/2024 7  % Final   Lymphs Abs 01/29/2024 0.6 (L)  0.7 - 4.0 K/uL Final   Monocytes Relative 01/29/2024 3  % Final   Monocytes Absolute 01/29/2024 0.3  0.1 - 1.0 K/uL Final   Eosinophils Relative 01/29/2024 0  % Final   Eosinophils Absolute 01/29/2024 0.0  0.0 - 0.5 K/uL Final    Basophils Relative 01/29/2024 0  % Final   Basophils Absolute 01/29/2024 0.0  0.0 - 0.1 K/uL Final   Immature Granulocytes 01/29/2024 0  % Final   Abs Immature Granulocytes 01/29/2024 0.03  0.00 - 0.07 K/uL Final   Performed at [facility], [address], [place], [place] [address]   Salicylate Lvl 01/29/2024 <7.0 (L)  7.0 - 30.0 mg/dL Final   Performed at [facility], [address], [place], [place] [address]   Acetaminophen  (Tylenol ), Serum 01/29/2024 <10 (L)  10 - 30 ug/mL Final   Comment: (NOTE) Therapeutic concentrations vary significantly. A range of 10-30 ug/mL  may be an effective concentration for many patients. However, some  are best treated at concentrations outside of this range. Acetaminophen  concentrations >150 ug/mL at 4 hours after ingestion  and >50 ug/mL at 12 hours after ingestion are often associated with  toxic reactions.  Performed at [facility], [address], [place], [place] [address]    Alcohol, Ethyl (B) 01/29/2024 <15  <15 mg/dL Final   Comment: Please note change in reference range. (NOTE) For medical purposes only. Performed at [facility], [address], [place], [place] [address]   Admission on 01/28/2024, Discharged on 01/28/2024  Component Date Value Ref Range Status   Sodium 01/28/2024 138  135 - 145 mmol/L Final   Potassium 01/28/2024 3.3 (L)  3.5 - 5.1 mmol/L Final   Chloride 01/28/2024 107  98 - 111 mmol/L Final   CO2 01/28/2024 23  22 - 32 mmol/L Final   Glucose, Bld 01/28/2024 102 (H)  70 - 99 mg/dL Final   Glucose reference range applies only to samples taken after fasting for at least 8 hours.   BUN 01/28/2024 19  6 - 20 mg/dL Final   Creatinine, Ser 01/28/2024 0.96  0.61 - 1.24 mg/dL Final   Calcium  01/28/2024 8.6 (L)  8.9 - 10.3 mg/dL Final   Total Protein 94/89/7974 6.8  6.5 - 8.1 g/dL Final   Albumin 94/89/7974 3.9  3.5 - 5.0 g/dL Final   AST 94/89/7974 39  15 - 41 U/L Final   ALT 01/28/2024 47 (H)   0 - 44 U/L Final   Alkaline Phosphatase 01/28/2024 52  38 - 126 U/L Final   Total Bilirubin 01/28/2024 0.6  0.0 - 1.2 mg/dL Final   GFR, Estimated 01/28/2024 >60  >60 mL/min Final   Comment: (NOTE) Calculated using the CKD-EPI Creatinine Equation (2021)    Anion gap 01/28/2024 8  5 - 15 Final   Performed  at [facility], [address], [place], [place] [address]   Alcohol, Ethyl (B) 01/28/2024 <15  <15 mg/dL Final   Comment: Please note change in reference range. (NOTE) For medical purposes only. Performed at [facility], [address] [place], [place] [address]    WBC 01/28/2024 5.8  4.0 - 10.5 K/uL Final   RBC 01/28/2024 3.81 (L)  4.22 - 5.81 MIL/uL Final   Hemoglobin 01/28/2024 11.1 (L)  13.0 - 17.0 g/dL Final   HCT 94/89/7974 33.8 (L)  39.0 - 52.0 % Final   MCV 01/28/2024 88.7  80.0 - 100.0 fL Final   MCH 01/28/2024 29.1  26.0 - 34.0 pg Final   MCHC 01/28/2024 32.8  30.0 - 36.0 g/dL Final   RDW 94/89/7974 14.4  11.5 - 15.5 % Final   Platelets 01/28/2024 215  150 - 400 K/uL Final   nRBC 01/28/2024 0.0  0.0 - 0.2 % Final   Performed at [facility], [address], [place], [place] [address]   Opiates 01/28/2024 NONE DETECTED  NONE DETECTED Final   Cocaine 01/28/2024 POSITIVE (A)  NONE DETECTED Final   Benzodiazepines 01/28/2024 POSITIVE (A)  NONE DETECTED Final   Amphetamines 01/28/2024 POSITIVE (A)  NONE DETECTED Final   Comment: (NOTE) Trazodone  is metabolized in vivo to several metabolites, including pharmacologically active m-CPP, which is excreted in the urine. Immunoassay screens for amphetamines and MDMA have potential cross-reactivity with these compounds and may provide false positive  results.     Tetrahydrocannabinol 01/28/2024 POSITIVE (A)  NONE DETECTED Final   Barbiturates 01/28/2024 NONE DETECTED  NONE DETECTED Final   Comment: (NOTE) DRUG SCREEN FOR MEDICAL PURPOSES ONLY.  IF CONFIRMATION IS  NEEDED FOR ANY PURPOSE, NOTIFY LAB WITHIN 5 DAYS.  LOWEST DETECTABLE LIMITS FOR URINE DRUG SCREEN Drug Class                     Cutoff (ng/mL) Amphetamine and metabolites    1000 Barbiturate and metabolites    200 Benzodiazepine                 200 Opiates and metabolites        300 Cocaine and metabolites        300 THC                            50 Performed at [facility], [address], [place], [place] [address]    Salicylate Lvl 01/28/2024 <7.0 (L)  7.0 - 30.0 mg/dL Final   Performed at [facility], [address], [place], [place] [address]   Acetaminophen  (Tylenol ), Serum 01/28/2024 <10 (L)  10 - 30 ug/mL Final   Comment: (NOTE) Therapeutic concentrations vary significantly. A range of 10-30 ug/mL  may be an effective concentration for many patients. However, some  are best treated at concentrations outside of this range. Acetaminophen  concentrations >150 ug/mL at 4 hours after ingestion  and >50 ug/mL at 12 hours after ingestion are often associated with  toxic reactions.  Performed at [facility], [address], [place], [place] [address]   Admission on 01/12/2024, Discharged on 01/13/2024  Component Date Value Ref Range Status   Sodium 01/12/2024 140  135 - 145 mmol/L Final   Potassium 01/12/2024 3.5  3.5 - 5.1 mmol/L Final   Chloride 01/12/2024 105  98 - 111 mmol/L Final  CO2 01/12/2024 23  22 - 32 mmol/L Final   Glucose, Bld 01/12/2024 163 (H)  70 - 99 mg/dL Final   Glucose reference range applies only to samples taken after fasting for at least 8 hours.   BUN 01/12/2024 15  6 - 20 mg/dL Final   Creatinine, Ser 01/12/2024 1.21  0.61 - 1.24 mg/dL Final   Calcium  01/12/2024 9.8  8.9 - 10.3 mg/dL Final   Total Protein 95/75/7974 7.1  6.5 - 8.1 g/dL Final   Albumin 95/75/7974 4.2  3.5 - 5.0 g/dL Final   AST 95/75/7974 26  15 - 41 U/L Final   ALT 01/12/2024 24  0 - 44 U/L Final   Alkaline Phosphatase  01/12/2024 54  38 - 126 U/L Final   Total Bilirubin 01/12/2024 0.6  0.0 - 1.2 mg/dL Final   GFR, Estimated 01/12/2024 >60  >60 mL/min Final   Comment: (NOTE) Calculated using the CKD-EPI Creatinine Equation (2021)    Anion gap 01/12/2024 12  5 - 15 Final   Performed at [facility], [address], [place], [place] [address]   Alcohol, Ethyl (B) 01/12/2024 <15  <15 mg/dL Final   Comment: Please note change in reference range. (NOTE) For medical purposes only. Performed at [facility], [address] [place], [place] [address]    WBC 01/12/2024 11.9 (H)  4.0 - 10.5 K/uL Final   RBC 01/12/2024 5.30  4.22 - 5.81 MIL/uL Final   Hemoglobin 01/12/2024 15.3  13.0 - 17.0 g/dL Final   HCT 95/75/7974 46.8  39.0 - 52.0 % Final   MCV 01/12/2024 88.3  80.0 - 100.0 fL Final   MCH 01/12/2024 28.9  26.0 - 34.0 pg Final   MCHC 01/12/2024 32.7  30.0 - 36.0 g/dL Final   RDW 95/75/7974 13.5  11.5 - 15.5 % Final   Platelets 01/12/2024 307  150 - 400 K/uL Final   nRBC 01/12/2024 0.0  0.0 - 0.2 % Final   Performed at [facility], [address], [place], [place] [address]   Opiates 01/12/2024 NONE DETECTED  NONE DETECTED Final   Cocaine 01/12/2024 NONE DETECTED  NONE DETECTED Final   Benzodiazepines 01/12/2024 NONE DETECTED  NONE DETECTED Final   Amphetamines 01/12/2024 POSITIVE (A)  NONE DETECTED Final   Comment: (NOTE) Trazodone  is metabolized in vivo to several metabolites, including pharmacologically active m-CPP, which is excreted in the urine. Immunoassay screens for amphetamines and MDMA have potential cross-reactivity with these compounds and may provide false positive  results.     Tetrahydrocannabinol 01/12/2024 POSITIVE (A)  NONE DETECTED Final   Barbiturates 01/12/2024 NONE DETECTED  NONE DETECTED Final   Comment: (NOTE) DRUG SCREEN FOR MEDICAL PURPOSES ONLY.  IF CONFIRMATION IS NEEDED FOR ANY PURPOSE, NOTIFY LAB WITHIN 5 DAYS.  LOWEST DETECTABLE LIMITS FOR  URINE DRUG SCREEN Drug Class                     Cutoff (ng/mL) Amphetamine and metabolites    1000 Barbiturate and metabolites    200 Benzodiazepine                 200 Opiates and metabolites        300 Cocaine and metabolites        300 THC                            50 Performed at  [facility], [address], [place], [place] [address]     Blood Alcohol level:  Lab Results  Component Value Date   [facility] <15 06/13/2024   ETH <15 05/05/2024    Metabolic Disorder Labs: Lab Results  Component Value Date   HGBA1C 5.7 (H) 09/19/2023   MPG 117 09/19/2023   MPG 105.41 07/26/2018   Lab Results  Component Value Date   PROLACTIN 11.8 07/26/2018   Lab Results  Component Value Date   CHOL 177 09/02/2023   TRIG 70 09/02/2023   HDL 63 09/02/2023   CHOLHDL 2.8 09/02/2023   VLDL 14 09/02/2023   LDLCALC 100 (H) 09/02/2023   LDLCALC UNABLE TO CALCULATE IF TRIGLYCERIDE OVER 400 mg/dL 88/93/7980    Therapeutic Lab Levels: No results found for: LITHIUM No results found for: VALPROATE No results found for: CBMZ  Physical Findings   AIMS    Flowsheet Row [facility]  AIMS Total Score 0   AUDIT    Flowsheet Row Ad[facility]) [facility] Admission (Discharged[facility] Admission (Discharged) from 08/31/2023 in [facility] Admission (Discharged) [facility]  Alcohol Use Disorder Identification Test Final Score (AUDIT) 0 0 0 0   PHQ2-9    Flowsheet Row ED from 06/13/2024 in [facility]  PHQ-2 Total Score 6  PHQ-9 Total Score 16   Flowsheet Row ED from 06/13/2024 in [facility] Most recent reading at 06/13/2024 11:18 PM ED from 06/13/2024 in [facility] Most recent reading at 06/13/2024  8:05 PM ED from 05/04/2024 in [facility] Emergency Department at [facility] Most recent reading at 05/04/2024 11:57 PM  C-SSRS RISK CATEGORY Low Risk High Risk Moderate Risk     Musculoskeletal  Strength & Muscle Tone: within normal limits Gait & Station: normal Patient leans: N/A  Psychiatric Specialty Exam  Presentation  General Appearance:  Appropriate for Environment  Eye Contact: Fair  Speech: Clear and Coherent  Speech Volume: Normal  Handedness: Right   Mood and Affect  Mood: Depressed; Anxious  Affect: Flat   Thought Process  Thought Processes: Coherent; Goal Directed  Descriptions of Associations:Intact  Orientation:Full (Time, Place and Person)  Thought Content:Logical  Diagnosis of Schizophrenia or Schizoaffective disorder in past: No    Hallucinations:Hallucinations: None Description of Auditory Hallucinations: Pt reports hearing mumblimg voices.  Ideas of Reference:None  Suicidal Thoughts:Suicidal Thoughts: No SI Passive Intent and/or Plan: Without Plan  Homicidal Thoughts:Homicidal Thoughts: No   Sensorium  Memory: Immediate Fair; Recent Fair; Remote Fair  Judgment: Intact  Insight: Present   Executive Functions  Concentration: Fair  Attention Span: Fair  Recall: [employer] of Knowledge: Fair  Language: Fair   Psychomotor Activity  Psychomotor Activity: Psychomotor Activity: Normal   Assets  Assets: Communication Skills   Sleep  Sleep: Sleep: Fair    Nutritional Assessment (For OBS and FBC admissions only) Has the patient had a weight loss or gain of 10 pounds or more in the last 3 months?: No Has the patient had a decrease in food intake/or appetite?: No Does the patient have dental problems?: No Does the patient have eating habits or behaviors that may be indicators of an eating disorder including  binging or inducing vomiting?: No Has  the patient recently lost weight without trying?: 0 Has the patient been eating poorly because of a decreased appetite?: 0 Malnutrition Screening Tool Score: 0    Physical Exam  Physical Exam Eyes:     Conjunctiva/sclera: Conjunctivae normal.  Cardiovascular:     Rate and Rhythm: Normal rate.  Pulmonary:     Effort: Pulmonary effort is normal.  Musculoskeletal:        General: Normal range of motion.     Cervical back: Normal range of motion.  Neurological:     Mental Status: He is alert and oriented to person, place, and time.    Review of Systems  Constitutional: Negative.   HENT: Negative.    Eyes: Negative.   Respiratory: Negative.    Cardiovascular: Negative.   Gastrointestinal: Negative.   Genitourinary: Negative.   Musculoskeletal: Negative.   Neurological: Negative.   Endo/Heme/Allergies: Negative.   Psychiatric/Behavioral:  Positive for depression and substance abuse. The patient is nervous/anxious.    Blood pressure 106/74, pulse 87, temperature 97.6 F (36.4 C), resp. rate 18, SpO2 100%. There is no height or weight on file to calculate BMI.  Treatment Plan Summary: Patient admitted to the [facility] for mood stabilization and substance abuse treatment.  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 Patient has been referred to [facility], [facility] and [facility], Wyline CROME, NP 06/14/2024 1:14 PM

## 2024-06-14 NOTE — ED Notes (Signed)
 Patient A&Ox4. Denies intent to harm self/others when asked. Denies A/VH. Patient denies any physical complaints when asked. No acute distress noted. Support and encouragement provided. Routine safety checks conducted according to facility protocol. Encouraged patient to notify staff if thoughts of harm toward self or others arise. Patient verbalize understanding and agreement. Will continue to monitor for safety.

## 2024-06-14 NOTE — Group Note (Signed)
 Group Topic: Recovery Basics  Group Date: 06/14/2024 Start Time: 2000 End Time: 2100 Facilitators: Joan Plowman B  Department: [facility]  Number of Participants: 4  Group Focus: check in Treatment Modality:  Psychoeducation Interventions utilized were leisure development Purpose: express feelings  Name: Trevor [name] Date of Birth: 1981-02-25  MR: [id number]    Level of Participation: PT DID NOT ATTEND GROUP Quality of Participation: cooperative Interactions with others: gave feedback Mood/Affect: appropriate Triggers (if applicable): NA Cognition: coherent/clear Progress: NONE Response: NA Plan: patient will be encouraged to go to groups  Patients Problems:  Patient Active Problem List   Diagnosis Date Noted   Substance induced mood disorder (HCC) 06/13/2024   Suicidal ideations 06/13/2024   Psychosis (HCC) 02/24/2024   Malingering 02/24/2024   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, 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/06/2024   Bipolar 1 disorder, depressed (HCC) 01/30/2024   Methamphetamine dependence (HCC) 10/21/2023   History of intravenous drug abuse 10/19/2023   Osteomyelitis of left arm (HCC) 10/19/2023   Septic olecranon bursitis of right elbow 10/19/2023   Hypokalemia 10/14/2023   Normocytic anemia 10/14/2023   Polysubstance abuse (HCC) 10/14/2023   Cellulitis of right elbow 10/13/2023   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

## 2024-06-14 NOTE — Group Note (Signed)
 Group Topic: Communication  Group Date: 06/14/2024 Start Time: 0900 End Time: 1000 Facilitators: Herold Lajuana NOVAK, RN  Department: [facility]  Number of Participants: 9  Group Focus: communication Treatment Modality:  Individual Therapy Interventions utilized were patient education Purpose: increase insight  Name: Trevor [name] Date of Birth: 1980/10/18  MR: [id number]      Patients Problems:  Level of Participation: active Quality of Participation: attentive Interactions with others: gave feedback Mood/Affect: appropriate Triggers (if applicable): none identified Cognition: coherent/clear Progress: Gaining insight Response: pt verbalized indication for all medications received Plan: patient will be encouraged to remain med compliant and notify staff with any SE of medications given

## 2024-06-14 NOTE — Care Management (Signed)
 [facility] Care Management  Writer met with the patient and discussed discharge planning.  Patient requests inpatient substance abuse treatment.    PLAN: Patient has been referred to [facility], [facility] and [facility]

## 2024-06-14 NOTE — ED Notes (Signed)
Pt sleeping at present, no distress noted,  Monitoring for safety. 

## 2024-06-15 DIAGNOSIS — F1310 Sedative, hypnotic or anxiolytic abuse, uncomplicated: Secondary | ICD-10-CM | POA: Diagnosis not present

## 2024-06-15 DIAGNOSIS — F411 Generalized anxiety disorder: Secondary | ICD-10-CM | POA: Diagnosis not present

## 2024-06-15 DIAGNOSIS — F1210 Cannabis abuse, uncomplicated: Secondary | ICD-10-CM | POA: Diagnosis not present

## 2024-06-15 DIAGNOSIS — F1529 Other stimulant dependence with unspecified stimulant-induced disorder: Secondary | ICD-10-CM | POA: Diagnosis not present

## 2024-06-15 NOTE — ED Notes (Signed)
 Pt sleeping at this time. Rise and fall of chest noted. Pt in NAD at this time. Will continue to monitor.

## 2024-06-15 NOTE — ED Notes (Signed)
 Pt sitting in dayroom watching television and eating lunch. No acute distress noted. No concerns voiced. Informed pt to notify staff with any needs or assistance. Pt verbalized understanding and agreement. Will continue to monitor for safety.

## 2024-06-15 NOTE — ED Notes (Signed)
 Pt sleeping in no acute distress. RR even and unlabored. Environment secured. Will continue to monitor for safety.

## 2024-06-15 NOTE — ED Notes (Signed)
 Patient A&Ox4. Denies intent to harm self/others when asked. Denies A/VH. Patient denies any physical complaints when asked. No acute distress noted. Minimal interaction noted with peers. Pt refused to attend group today. Pt states, I just want to sleep. Support and encouragement provided. Routine safety checks conducted according to facility protocol. Encouraged patient to notify staff if thoughts of harm toward self or others arise. Patient verbalize understanding and agreement. Will continue to monitor for safety.

## 2024-06-15 NOTE — [facility] Group Notes (Signed)
 SPIRITUALITY GROUP NOTE  Spirituality group facilitated by Chaplain Solana Coggin, MDiv, [facility].  Group Description:  Group focused on topic of hope.  Patients participated in facilitated discussion around topic, connecting with one another around experiences and definitions for hope.  Group members engaged with visual explorer photos, reflecting on what hope looks like for them today.  Group engaged in discussion around how their definitions of hope are present today in hospital.   Modalities: Psycho-social ed, Adlerian, Narrative, MI Patient Progress: DID NOT ATTEND

## 2024-06-15 NOTE — Care Management (Signed)
 [facility] Care Management  Writer met with the patient and discussed discharge planning.  Patient was accepted to [facility].  Patient then declined placement at [facility].  Patient reports that [facility] was not a good fit for him.   Writer informed the patient that he will be discharging on Saturday 06-16-2024.  Patient reports that he completed the phone screening with [facility] but he does not know if he has been accepted to their facility,  Writer contacted [facility] regarding his completed phone intake and was informed that the patient is still under review for placement.    Writer informed the patient that he is able to continue to follow up with [facility] from the community.   Patient reports that he is willing to participate in the CD-IOP program if he is not able to go inpatient.  Writer submitted a referral to the CD-IOP program.  Writer informed the patient that the CD-IOP Team would be able to contact him on Monday with a follow up decision that he meets criteria for their group program.

## 2024-06-15 NOTE — ED Notes (Signed)
 Pt sleeping at present, no distress noted.  Monitoring for safety.

## 2024-06-15 NOTE — ED Provider Notes (Signed)
 Behavioral Health Progress Note  Date and Time: 06/15/2024 10:55 AM Name: Trevor [name] MRN:  [id number]  Subjective:  Trevor [name] is feeling better today. He slept alright. Cravings are pretty bad but otherwise denies w/d symptoms. He is interested in long-term residential treatment but not [facility] because it doesn't work and he didn't like it...not for me.  It is not clear that he's contacted other programs yet. If such programs are not available he has no plan that's why I came here. He cannot go to [facility] or [facility] because he does not have money to pay.  He reports good sleep. He reports a good appetite here but requests [facility]. He reports being underweight due to substance-induced anorexia. He denies suicidal thoughts today. He denies a history of past suicide attempts. He denies auditory or visual hallucinations today. He denies paranoia. He denies physical complaints. He denies medication side effects.   HPI: Trevor [name]. Trevor [name] is a 43 year old male with a psychiatric history of bipolar 1 disorder, GAD, homelessness, narcotic ideation, poorly substance abuse-cocaine, tobacco, methamphetamine, cannabis, psychosis, and suicidal ideations, who was admitted to the [facility] with c/o SI no plan or intent and substance abuse (methamphetamine use). UDS positive for amphetamine, benzodiazepine and THC. BAL negative. He endorsed continuous methamphetamine abuse for the past 10 years, currently using about a gram a day, stating I shoot it up.  Last used this a.m. He endorsed daily marijuana use. He denied other illicit substance use. Patient endorsed suicidal thoughts, no plan for couple of years. He endorsed auditory hallucinations of voices mumbling.   He states that he has been using methamphetamines for the past 10 years, via IV use, on average $10-$20. He also reports daily marijuana use. He was informed that his urine drug screen showed  positive for benzodiazepines. He reports getting benzodiazepines, Xanax  from off the street for the past year and states that he takes about 1-2 Xanax  daily and the last time he used was 1 week ago.   Diagnosis:  Final diagnoses:  Methamphetamine use disorder, severe, dependence (HCC)  Suicidal ideation  Substance induced mood disorder (HCC)  Cannabis abuse, continuous use  Homelessness unspecified  Hallucination  Benzodiazepine abuse (HCC)   Total Time spent with patient: I personally spent 20 minutes on the unit in direct patient care. The direct patient care time included face-to-face time with the patient, reviewing the patient's chart, communicating with other professionals, and coordinating care. Greater than 50% of this time was spent in counseling or coordinating care with the patient regarding goals of hospitalization, psycho-education, and discharge planning needs.  Past Psychiatric History: A psychiatric history of bipolar 1 disorder, GAD, homelessness, narcotic ideation, poorly substance abuse-cocaine, tobacco, methamphetamine, cannabis, psychosis, and suicidal ideations. Per chart review, patient presented earlier to the ED today endorsing suicidal ideations and auditory hallucinations in the context of meth amphetamine dependence. Patient has history of repeated ED presentations and multiple prior psychiatric hospitalizations. He is seeking psychiatric admission and referral to long-term residential program. Patient reports a history of substance treatments/detox at [facility] years ago.   Past Medical History: No reported history.   Family Psychiatric  History: No reported history.   Social History: He reports sheltered homelessness and states I stay with a friend.  Current Medications:  Current Facility-Administered Medications  Medication Dose Route Frequency Provider Last Rate Last Admin   alum & mag hydroxide-simeth (MAALOX/MYLANTA) 200-200-20 MG/5ML suspension 30 mL  30 mL  Oral Q4H PRN Coleman, Carolyn H, NP       dicyclomine  (BENTYL ) tablet 20 mg  20 mg Oral Q6H PRN [name], Chinwendu V, NP       haloperidol  (HALDOL ) tablet 5 mg  5 mg Oral TID PRN Mardy Elveria DEL, NP       And   diphenhydrAMINE  (BENADRYL ) capsule 50 mg  50 mg Oral TID PRN Mardy Elveria DEL, NP       haloperidol  lactate (HALDOL ) injection 5 mg  5 mg Intramuscular TID PRN Mardy Elveria DEL, NP       And   diphenhydrAMINE  (BENADRYL ) injection 50 mg  50 mg Intramuscular TID PRN Mardy Elveria DEL, NP       And   LORazepam  (ATIVAN ) injection 2 mg  2 mg Intramuscular TID PRN Coleman, Carolyn H, NP       haloperidol  lactate (HALDOL ) injection 10 mg  10 mg Intramuscular TID PRN Mardy Elveria DEL, NP       And   diphenhydrAMINE  (BENADRYL ) injection 50 mg  50 mg Intramuscular TID PRN Mardy Elveria DEL, NP       And   LORazepam  (ATIVAN ) injection 2 mg  2 mg Intramuscular TID PRN Coleman, Carolyn H, NP       FLUoxetine  (PROZAC ) capsule 20 mg  20 mg Oral Daily Coleman, Carolyn H, NP   20 mg at 06/15/24 9071   hydrOXYzine  (ATARAX ) tablet 25 mg  25 mg Oral Q6H PRN [name], Chinwendu V, NP       loperamide  (IMODIUM ) capsule 2-4 mg  2-4 mg Oral PRN [name], Chinwendu V, NP       magnesium  hydroxide (MILK OF MAGNESIA) suspension 30 mL  30 mL Oral Daily PRN Coleman, Carolyn H, NP       methocarbamol  (ROBAXIN ) tablet 500 mg  500 mg Oral Q8H PRN [name], Chinwendu V, NP       naproxen  (NAPROSYN ) tablet 500 mg  500 mg Oral BID PRN [name], Chinwendu V, NP       OLANZapine  (ZYPREXA ) tablet 5 mg  5 mg Oral BID Coleman, Carolyn H, NP   5 mg at 06/15/24 9071   ondansetron  (ZOFRAN -ODT) disintegrating tablet 4 mg  4 mg Oral Q6H PRN [name], Chinwendu V, NP       traZODone  (DESYREL ) tablet 50 mg  50 mg Oral QHS PRN Mardy Elveria DEL, NP       Current Outpatient Medications  Medication Sig Dispense Refill   ALPRAZolam  (XANAX ) 0.5 MG tablet Take 0.5 mg by mouth 3 (three) times daily as needed for anxiety. (Patient not  taking: Reported on 06/13/2024)     amLODipine  (NORVASC ) 10 MG tablet Take 1 tablet (10 mg total) by mouth daily. (Patient not taking: Reported on 06/13/2024) 30 tablet 0   escitalopram  (LEXAPRO ) 10 MG tablet Take 1 tablet (10 mg total) by mouth daily. (Patient not taking: Reported on 06/13/2024) 30 tablet 0   FLUoxetine  (PROZAC ) 10 MG capsule Take 10 mg by mouth daily. (Patient not taking: Reported on 06/13/2024)     nicotine  (NICODERM CQ  - DOSED IN MG/24 HOURS) 14 mg/24hr patch Place 1 patch (14 mg total) onto the skin daily. (Patient not taking: Reported on 06/13/2024) 28 patch 0   OLANZapine  (ZYPREXA ) 10 MG tablet Take 1 tablet (10 mg total) by mouth at bedtime. (Patient not taking: Reported on 06/13/2024) 30 tablet 0    Labs  Lab Results:  Admission on 06/13/2024, Discharged on 06/13/2024  Component Date Value Ref  Range Status   Sodium 06/13/2024 137  135 - 145 mmol/L Final   Potassium 06/13/2024 3.8  3.5 - 5.1 mmol/L Final   Chloride 06/13/2024 102  98 - 111 mmol/L Final   CO2 06/13/2024 26  22 - 32 mmol/L Final   Glucose, Bld 06/13/2024 106 (H)  70 - 99 mg/dL Final   Glucose reference range applies only to samples taken after fasting for at least 8 hours.   BUN 06/13/2024 9  6 - 20 mg/dL Final   Creatinine, Ser 06/13/2024 0.96  0.61 - 1.24 mg/dL Final   Calcium  06/13/2024 9.4  8.9 - 10.3 mg/dL Final   Total Protein 90/75/7974 7.4  6.5 - 8.1 g/dL Final   Albumin 90/75/7974 3.9  3.5 - 5.0 g/dL Final   AST 90/75/7974 21  15 - 41 U/L Final   ALT 06/13/2024 15  0 - 44 U/L Final   Alkaline Phosphatase 06/13/2024 57  38 - 126 U/L Final   Total Bilirubin 06/13/2024 0.6  0.0 - 1.2 mg/dL Final   GFR, Estimated 06/13/2024 >60  >60 mL/min Final   Comment: (NOTE) Calculated using the CKD-EPI Creatinine Equation (2021)    Anion gap 06/13/2024 9  5 - 15 Final   Performed at [facility], [address], [place], [place] [address]   Alcohol, Ethyl (B) 06/13/2024 <15  <15 mg/dL Final    Comment: (NOTE) For medical purposes only. Performed at [facility], [address], [place], [place] [address]    WBC 06/13/2024 8.5  4.0 - 10.5 K/uL Final   RBC 06/13/2024 4.80  4.22 - 5.81 MIL/uL Final   Hemoglobin 06/13/2024 14.2  13.0 - 17.0 g/dL Final   HCT 90/75/7974 42.6  39.0 - 52.0 % Final   MCV 06/13/2024 88.8  80.0 - 100.0 fL Final   MCH 06/13/2024 29.6  26.0 - 34.0 pg Final   MCHC 06/13/2024 33.3  30.0 - 36.0 g/dL Final   RDW 90/75/7974 12.7  11.5 - 15.5 % Final   Platelets 06/13/2024 314  150 - 400 K/uL Final   nRBC 06/13/2024 0.0  0.0 - 0.2 % Final   Performed at [facility], [address] [place], [place] [address]   Opiates 06/13/2024 NONE DETECTED  NONE DETECTED Final   Cocaine 06/13/2024 NONE DETECTED  NONE DETECTED Final   Benzodiazepines 06/13/2024 POSITIVE (A)  NONE DETECTED Final   Amphetamines 06/13/2024 POSITIVE (A)  NONE DETECTED Final   Comment: (NOTE) Trazodone  is metabolized in vivo to several metabolites, including pharmacologically active m-CPP, which is excreted in the urine. Immunoassay screens for amphetamines and MDMA have potential cross-reactivity with these compounds and may provide false positive  results.     Tetrahydrocannabinol 06/13/2024 POSITIVE (A)  NONE DETECTED Final   Barbiturates 06/13/2024 NONE DETECTED  NONE DETECTED Final   Comment: (NOTE) DRUG SCREEN FOR MEDICAL PURPOSES ONLY.  IF CONFIRMATION IS NEEDED FOR ANY PURPOSE, NOTIFY LAB WITHIN 5 DAYS.  LOWEST DETECTABLE LIMITS FOR URINE DRUG SCREEN Drug Class                     Cutoff (ng/mL) Amphetamine and metabolites    1000 Barbiturate and metabolites    200 Benzodiazepine                 200 Opiates and metabolites        300 Cocaine and metabolites        300 THC  50 Performed at [facility], [address], [place], [place] [address]   Admission on 05/04/2024, Discharged on 05/06/2024  Component Date Value Ref Range  Status   Sodium 05/05/2024 137  135 - 145 mmol/L Final   Potassium 05/05/2024 2.8 (L)  3.5 - 5.1 mmol/L Final   Chloride 05/05/2024 104  98 - 111 mmol/L Final   CO2 05/05/2024 22  22 - 32 mmol/L Final   Glucose, Bld 05/05/2024 127 (H)  70 - 99 mg/dL Final   Glucose reference range applies only to samples taken after fasting for at least 8 hours.   BUN 05/05/2024 25 (H)  6 - 20 mg/dL Final   Creatinine, Ser 05/05/2024 1.24  0.61 - 1.24 mg/dL Final   Calcium  05/05/2024 9.2  8.9 - 10.3 mg/dL Final   Total Protein 91/83/7974 7.7  6.5 - 8.1 g/dL Final   Albumin 91/83/7974 4.1  3.5 - 5.0 g/dL Final   AST 91/83/7974 26  15 - 41 U/L Final   ALT 05/05/2024 17  0 - 44 U/L Final   Alkaline Phosphatase 05/05/2024 58  38 - 126 U/L Final   Total Bilirubin 05/05/2024 1.5 (H)  0.0 - 1.2 mg/dL Final   GFR, Estimated 05/05/2024 >60  >60 mL/min Final   Comment: (NOTE) Calculated using the CKD-EPI Creatinine Equation (2021)    Anion gap 05/05/2024 11  5 - 15 Final   Performed at [facility], [address], [place], [place] [address]   Alcohol, Ethyl (B) 05/05/2024 <15  <15 mg/dL Final   Comment: (NOTE) For medical purposes only. Performed at [facility], [address], [place], [place] [address]    WBC 05/05/2024 5.9  4.0 - 10.5 K/uL Final   RBC 05/05/2024 4.48  4.22 - 5.81 MIL/uL Final   Hemoglobin 05/05/2024 13.2  13.0 - 17.0 g/dL Final   HCT 91/83/7974 40.3  39.0 - 52.0 % Final   MCV 05/05/2024 90.0  80.0 - 100.0 fL Final   MCH 05/05/2024 29.5  26.0 - 34.0 pg Final   MCHC 05/05/2024 32.8  30.0 - 36.0 g/dL Final   RDW 91/83/7974 13.4  11.5 - 15.5 % Final   Platelets 05/05/2024 257  150 - 400 K/uL Final   nRBC 05/05/2024 0.0  0.0 - 0.2 % Final   Performed at [facility], [address] [place], [place] [address]   Opiates 05/05/2024 NONE DETECTED  NONE DETECTED Final   Cocaine 05/05/2024 NONE DETECTED  NONE DETECTED Final    Benzodiazepines 05/05/2024 POSITIVE (A)  NONE DETECTED Final   Amphetamines 05/05/2024 POSITIVE (A)  NONE DETECTED Final   Comment: (NOTE) Trazodone  is metabolized in vivo to several metabolites, including pharmacologically active m-CPP, which is excreted in the urine. Immunoassay screens for amphetamines and MDMA have potential cross-reactivity with these compounds and may provide false positive  results.     Tetrahydrocannabinol 05/05/2024 POSITIVE (A)  NONE DETECTED Final   Barbiturates 05/05/2024 NONE DETECTED  NONE DETECTED Final   Comment: (NOTE) DRUG SCREEN FOR MEDICAL PURPOSES ONLY.  IF CONFIRMATION IS NEEDED FOR ANY PURPOSE, NOTIFY LAB WITHIN 5 DAYS.  LOWEST DETECTABLE LIMITS FOR URINE DRUG SCREEN Drug Class                     Cutoff (ng/mL) Amphetamine and metabolites    1000 Barbiturate and metabolites    200 Benzodiazepine  200 Opiates and metabolites        300 Cocaine and metabolites        300 THC                            50 Performed at [facility], [address], [place], [place] [address]   Admission on 03/15/2024, Discharged on 03/16/2024  Component Date Value Ref Range Status   Sodium 03/15/2024 138  135 - 145 mmol/L Final   Potassium 03/15/2024 3.7  3.5 - 5.1 mmol/L Final   Chloride 03/15/2024 104  98 - 111 mmol/L Final   CO2 03/15/2024 24  22 - 32 mmol/L Final   Glucose, Bld 03/15/2024 83  70 - 99 mg/dL Final   Glucose reference range applies only to samples taken after fasting for at least 8 hours.   BUN 03/15/2024 20  6 - 20 mg/dL Final   Creatinine, Ser 03/15/2024 0.86  0.61 - 1.24 mg/dL Final   Calcium  03/15/2024 9.2  8.9 - 10.3 mg/dL Final   Total Protein 93/73/7974 6.7  6.5 - 8.1 g/dL Final   Albumin 93/73/7974 3.9  3.5 - 5.0 g/dL Final   AST 93/73/7974 19  15 - 41 U/L Final   ALT 03/15/2024 17  0 - 44 U/L Final   Alkaline Phosphatase 03/15/2024 45  38 - 126 U/L Final   Total Bilirubin 03/15/2024 0.8  0.0  - 1.2 mg/dL Final   GFR, Estimated 03/15/2024 >60  >60 mL/min Final   Comment: (NOTE) Calculated using the CKD-EPI Creatinine Equation (2021)    Anion gap 03/15/2024 10  5 - 15 Final   Performed at [facility], [address] [place], [place] [address]   Alcohol, Ethyl (B) 03/15/2024 <15  <15 mg/dL Final   Comment: (NOTE) For medical purposes only. Performed at [facility], [address], [place], [place] [address]    Opiates 03/15/2024 NONE DETECTED  NONE DETECTED Final   Cocaine 03/15/2024 NONE DETECTED  NONE DETECTED Final   Benzodiazepines 03/15/2024 NONE DETECTED  NONE DETECTED Final   Amphetamines 03/15/2024 POSITIVE (A)  NONE DETECTED Final   Comment: (NOTE) Trazodone  is metabolized in vivo to several metabolites, including pharmacologically active m-CPP, which is excreted in the urine. Immunoassay screens for amphetamines and MDMA have potential cross-reactivity with these compounds and may provide false positive  results.     Tetrahydrocannabinol 03/15/2024 POSITIVE (A)  NONE DETECTED Final   Barbiturates 03/15/2024 NONE DETECTED  NONE DETECTED Final   Comment: (NOTE) DRUG SCREEN FOR MEDICAL PURPOSES ONLY.  IF CONFIRMATION IS NEEDED FOR ANY PURPOSE, NOTIFY LAB WITHIN 5 DAYS.  LOWEST DETECTABLE LIMITS FOR URINE DRUG SCREEN Drug Class                     Cutoff (ng/mL) Amphetamine and metabolites    1000 Barbiturate and metabolites    200 Benzodiazepine                 200 Opiates and metabolites        300 Cocaine and metabolites        300 THC                            50 Performed at [facility], [address] [place], [place] [address]    WBC 03/15/2024 6.7  4.0 -  10.5 K/uL Final   RBC 03/15/2024 4.59  4.22 - 5.81 MIL/uL Final   Hemoglobin 03/15/2024 13.5  13.0 - 17.0 g/dL Final   HCT 93/73/7974 39.9  39.0 - 52.0 % Final   MCV 03/15/2024 86.9  80.0 - 100.0 fL Final   MCH 03/15/2024 29.4  26.0 - 34.0 pg Final   MCHC 03/15/2024  33.8  30.0 - 36.0 g/dL Final   RDW 93/73/7974 13.9  11.5 - 15.5 % Final   Platelets 03/15/2024 281  150 - 400 K/uL Final   nRBC 03/15/2024 0.0  0.0 - 0.2 % Final   Neutrophils Relative % 03/15/2024 60  % Final   Neutro Abs 03/15/2024 4.0  1.7 - 7.7 K/uL Final   Lymphocytes Relative 03/15/2024 32  % Final   Lymphs Abs 03/15/2024 2.1  0.7 - 4.0 K/uL Final   Monocytes Relative 03/15/2024 6  % Final   Monocytes Absolute 03/15/2024 0.4  0.1 - 1.0 K/uL Final   Eosinophils Relative 03/15/2024 1  % Final   Eosinophils Absolute 03/15/2024 0.1  0.0 - 0.5 K/uL Final   Basophils Relative 03/15/2024 1  % Final   Basophils Absolute 03/15/2024 0.0  0.0 - 0.1 K/uL Final   Immature Granulocytes 03/15/2024 0  % Final   Abs Immature Granulocytes 03/15/2024 0.02  0.00 - 0.07 K/uL Final   Performed at [facility], [address] [place], [place] [address]   Acetaminophen  (Tylenol ), Serum 03/15/2024 <10 (L)  10 - 30 ug/mL Final   Comment: (NOTE) Therapeutic concentrations vary significantly. A range of 10-30 ug/mL  may be an effective concentration for many patients. However, some  are best treated at concentrations outside of this range. Acetaminophen  concentrations >150 ug/mL at 4 hours after ingestion  and >50 ug/mL at 12 hours after ingestion are often associated with  toxic reactions.  Performed at [facility], [address] [place], [place] [address]    Salicylate Lvl 03/15/2024 <7.0 (L)  7.0 - 30.0 mg/dL Final   Performed at [facility], [address] [place], [place] [address]  Admission on 03/12/2024, Discharged on 03/12/2024  Component Date Value Ref Range Status   Sodium 03/12/2024 139  135 - 145 mmol/L Final   Potassium 03/12/2024 3.2 (L)  3.5 - 5.1 mmol/L Final   Chloride 03/12/2024 105  98 - 111 mmol/L Final   CO2 03/12/2024 25  22 - 32 mmol/L Final   Glucose, Bld 03/12/2024 105 (H)  70 - 99 mg/dL Final   Glucose reference range applies only to samples taken  after fasting for at least 8 hours.   BUN 03/12/2024 25 (H)  6 - 20 mg/dL Final   Creatinine, Ser 03/12/2024 0.87  0.61 - 1.24 mg/dL Final   Calcium  03/12/2024 8.9  8.9 - 10.3 mg/dL Final   Total Protein 93/76/7974 7.3  6.5 - 8.1 g/dL Final   Albumin 93/76/7974 4.2  3.5 - 5.0 g/dL Final   AST 93/76/7974 21  15 - 41 U/L Final   ALT 03/12/2024 17  0 - 44 U/L Final   Alkaline Phosphatase 03/12/2024 44  38 - 126 U/L Final   Total Bilirubin 03/12/2024 1.2  0.0 - 1.2 mg/dL Final   GFR, Estimated 03/12/2024 >60  >60 mL/min Final   Comment: (NOTE) Calculated using the CKD-EPI Creatinine Equation (2021)    Anion gap 03/12/2024 9  5 - 15 Final   Performed at [facility], [address] [place], [place] [address]   Alcohol, Ethyl (B) 03/12/2024 <15  <15 mg/dL Final   Comment: (NOTE) For medical purposes only. Performed at [facility], [address] [place], [place] [address]    WBC 03/12/2024 7.2  4.0 - 10.5 K/uL Final   RBC 03/12/2024 4.31  4.22 - 5.81 MIL/uL Final   Hemoglobin 03/12/2024 12.6 (L)  13.0 - 17.0 g/dL Final   HCT 93/76/7974 38.0 (L)  39.0 - 52.0 % Final   MCV 03/12/2024 88.2  80.0 - 100.0 fL Final   MCH 03/12/2024 29.2  26.0 - 34.0 pg Final   MCHC 03/12/2024 33.2  30.0 - 36.0 g/dL Final   RDW 93/76/7974 14.6  11.5 - 15.5 % Final   Platelets 03/12/2024 249  150 - 400 K/uL Final   nRBC 03/12/2024 0.0  0.0 - 0.2 % Final   Performed at [facility], [address], [place], [place] [address]   Opiates 03/12/2024 NONE DETECTED  NONE DETECTED Final   Cocaine 03/12/2024 NONE DETECTED  NONE DETECTED Final   Benzodiazepines 03/12/2024 NONE DETECTED  NONE DETECTED Final   Amphetamines 03/12/2024 POSITIVE (A)  NONE DETECTED Final   Comment: (NOTE) Trazodone  is metabolized in vivo to several metabolites, including pharmacologically active m-CPP, which is excreted in the urine. Immunoassay screens for amphetamines and MDMA  have potential cross-reactivity with these compounds and may provide false positive  results.     Tetrahydrocannabinol 03/12/2024 POSITIVE (A)  NONE DETECTED Final   Barbiturates 03/12/2024 NONE DETECTED  NONE DETECTED Final   Comment: (NOTE) DRUG SCREEN FOR MEDICAL PURPOSES ONLY.  IF CONFIRMATION IS NEEDED FOR ANY PURPOSE, NOTIFY LAB WITHIN 5 DAYS.  LOWEST DETECTABLE LIMITS FOR URINE DRUG SCREEN Drug Class                     Cutoff (ng/mL) Amphetamine and metabolites    1000 Barbiturate and metabolites    200 Benzodiazepine                 200 Opiates and metabolites        300 Cocaine and metabolites        300 THC                            50 Performed at [facility], [address] [place], [place] [address]   Admission on 02/24/2024, Discharged on 02/25/2024  Component Date Value Ref Range Status   Sodium 02/24/2024 137  135 - 145 mmol/L Final   Potassium 02/24/2024 4.2  3.5 - 5.1 mmol/L Final   Chloride 02/24/2024 101  98 - 111 mmol/L Final   CO2 02/24/2024 29  22 - 32 mmol/L Final   Glucose, Bld 02/24/2024 95  70 - 99 mg/dL Final   Glucose reference range applies only to samples taken after fasting for at least 8 hours.   BUN 02/24/2024 8  6 - 20 mg/dL Final   Creatinine, Ser 02/24/2024 0.95  0.61 - 1.24 mg/dL Final   Calcium  02/24/2024 9.3  8.9 - 10.3 mg/dL Final   Total Protein 93/93/7974 7.2  6.5 - 8.1 g/dL Final   Albumin 93/93/7974 4.0  3.5 - 5.0 g/dL Final   AST 93/93/7974 23  15 - 41 U/L Final   ALT 02/24/2024 19  0 - 44 U/L Final   Alkaline Phosphatase 02/24/2024 52  38 - 126 U/L Final  Total Bilirubin 02/24/2024 0.6  0.0 - 1.2 mg/dL Final   GFR, Estimated 02/24/2024 >60  >60 mL/min Final   Comment: (NOTE) Calculated using the CKD-EPI Creatinine Equation (2021)    Anion gap 02/24/2024 7  5 - 15 Final   Performed at [facility], [address] [place], [place] [address]   Alcohol, Ethyl (B) 02/24/2024 <15  <15 mg/dL Final    Comment: (NOTE) For medical purposes only. Performed at [facility], [address], [place], [place] [address]    Opiates 02/24/2024 NONE DETECTED  NONE DETECTED Final   Cocaine 02/24/2024 NONE DETECTED  NONE DETECTED Final   Benzodiazepines 02/24/2024 POSITIVE (A)  NONE DETECTED Final   Amphetamines 02/24/2024 POSITIVE (A)  NONE DETECTED Final   Comment: (NOTE) Trazodone  is metabolized in vivo to several metabolites, including pharmacologically active m-CPP, which is excreted in the urine. Immunoassay screens for amphetamines and MDMA have potential cross-reactivity with these compounds and may provide false positive  results.     Tetrahydrocannabinol 02/24/2024 POSITIVE (A)  NONE DETECTED Final   Barbiturates 02/24/2024 NONE DETECTED  NONE DETECTED Final   Comment: (NOTE) DRUG SCREEN FOR MEDICAL PURPOSES ONLY.  IF CONFIRMATION IS NEEDED FOR ANY PURPOSE, NOTIFY LAB WITHIN 5 DAYS.  LOWEST DETECTABLE LIMITS FOR URINE DRUG SCREEN Drug Class                     Cutoff (ng/mL) Amphetamine and metabolites    1000 Barbiturate and metabolites    200 Benzodiazepine                 200 Opiates and metabolites        300 Cocaine and metabolites        300 THC                            50 Performed at [facility], [address] [place], [place] [address]    WBC 02/24/2024 5.7  4.0 - 10.5 K/uL Final   RBC 02/24/2024 5.10  4.22 - 5.81 MIL/uL Final   Hemoglobin 02/24/2024 15.0  13.0 - 17.0 g/dL Final   HCT 93/93/7974 45.0  39.0 - 52.0 % Final   MCV 02/24/2024 88.2  80.0 - 100.0 fL Final   MCH 02/24/2024 29.4  26.0 - 34.0 pg Final   MCHC 02/24/2024 33.3  30.0 - 36.0 g/dL Final   RDW 93/93/7974 13.8  11.5 - 15.5 % Final   Platelets 02/24/2024 268  150 - 400 K/uL Final   nRBC 02/24/2024 0.0  0.0 - 0.2 % Final   Neutrophils Relative % 02/24/2024 67  % Final   Neutro Abs 02/24/2024 3.9  1.7 - 7.7 K/uL Final   Lymphocytes Relative 02/24/2024 24  % Final   Lymphs Abs  02/24/2024 1.4  0.7 - 4.0 K/uL Final   Monocytes Relative 02/24/2024 6  % Final   Monocytes Absolute 02/24/2024 0.3  0.1 - 1.0 K/uL Final   Eosinophils Relative 02/24/2024 2  % Final   Eosinophils Absolute 02/24/2024 0.1  0.0 - 0.5 K/uL Final   Basophils Relative 02/24/2024 1  % Final   Basophils Absolute 02/24/2024 0.0  0.0 - 0.1 K/uL Final   Immature Granulocytes 02/24/2024 0  % Final   Abs Immature Granulocytes 02/24/2024 0.01  0.00 - 0.07 K/uL Final   Performed at [facility], [address] [address], [place], [place]  [address]  Admission on 02/22/2024, Discharged on 02/22/2024  Component Date Value Ref Range Status   Sodium 02/22/2024 138  135 - 145 mmol/L Final   Potassium 02/22/2024 3.4 (L)  3.5 - 5.1 mmol/L Final   Chloride 02/22/2024 103  98 - 111 mmol/L Final   CO2 02/22/2024 27  22 - 32 mmol/L Final   Glucose, Bld 02/22/2024 112 (H)  70 - 99 mg/dL Final   Glucose reference range applies only to samples taken after fasting for at least 8 hours.   BUN 02/22/2024 10  6 - 20 mg/dL Final   Creatinine, Ser 02/22/2024 0.82  0.61 - 1.24 mg/dL Final   Calcium  02/22/2024 8.9  8.9 - 10.3 mg/dL Final   Total Protein 93/95/7974 7.0  6.5 - 8.1 g/dL Final   Albumin 93/95/7974 3.8  3.5 - 5.0 g/dL Final   AST 93/95/7974 22  15 - 41 U/L Final   ALT 02/22/2024 21  0 - 44 U/L Final   Alkaline Phosphatase 02/22/2024 53  38 - 126 U/L Final   Total Bilirubin 02/22/2024 0.8  0.0 - 1.2 mg/dL Final   GFR, Estimated 02/22/2024 >60  >60 mL/min Final   Comment: (NOTE) Calculated using the CKD-EPI Creatinine Equation (2021)    Anion gap 02/22/2024 8  5 - 15 Final   Performed at [facility], [address], [place], [place] [address]   WBC 02/22/2024 6.1  4.0 - 10.5 K/uL Final   RBC 02/22/2024 4.10 (L)  4.22 - 5.81 MIL/uL Final   Hemoglobin 02/22/2024 11.9 (L)  13.0 - 17.0 g/dL Final   HCT 93/95/7974 36.8 (L)  39.0 - 52.0 % Final   MCV 02/22/2024 89.8  80.0 - 100.0 fL Final   MCH  02/22/2024 29.0  26.0 - 34.0 pg Final   MCHC 02/22/2024 32.3  30.0 - 36.0 g/dL Final   RDW 93/95/7974 13.9  11.5 - 15.5 % Final   Platelets 02/22/2024 229  150 - 400 K/uL Final   nRBC 02/22/2024 0.0  0.0 - 0.2 % Final   Performed at [facility], [address] [place], [place] [address]  Admission on 01/29/2024, Discharged on 01/30/2024  Component Date Value Ref Range Status   Sodium 01/29/2024 140  135 - 145 mmol/L Final   Potassium 01/29/2024 3.8  3.5 - 5.1 mmol/L Final   Chloride 01/29/2024 103  98 - 111 mmol/L Final   CO2 01/29/2024 25  22 - 32 mmol/L Final   Glucose, Bld 01/29/2024 108 (H)  70 - 99 mg/dL Final   Glucose reference range applies only to samples taken after fasting for at least 8 hours.   BUN 01/29/2024 9  6 - 20 mg/dL Final   Creatinine, Ser 01/29/2024 0.83  0.61 - 1.24 mg/dL Final   Calcium  01/29/2024 9.4  8.9 - 10.3 mg/dL Final   Total Protein 94/88/7974 7.0  6.5 - 8.1 g/dL Final   Albumin 94/88/7974 4.0  3.5 - 5.0 g/dL Final   AST 94/88/7974 27  15 - 41 U/L Final   ALT 01/29/2024 38  0 - 44 U/L Final   Alkaline Phosphatase 01/29/2024 54  38 - 126 U/L Final   Total Bilirubin 01/29/2024 0.9  0.0 - 1.2 mg/dL Final   GFR, Estimated 01/29/2024 >60  >60 mL/min Final   Comment: (NOTE) Calculated using the CKD-EPI Creatinine Equation (2021)    Anion gap 01/29/2024 12  5 - 15 Final   Performed at [facility], [address], [place], [place] [address]   WBC 01/29/2024 8.6  4.0 - 10.5 K/uL Final   RBC 01/29/2024 4.46  4.22 - 5.81 MIL/uL Final   Hemoglobin 01/29/2024 12.9 (L)  13.0 - 17.0 g/dL Final   HCT 94/88/7974 38.8 (L)  39.0 - 52.0 % Final   MCV 01/29/2024 87.0  80.0 - 100.0 fL Final   MCH 01/29/2024 28.9  26.0 - 34.0 pg Final   MCHC 01/29/2024 33.2  30.0 - 36.0 g/dL Final   RDW 94/88/7974 13.9  11.5 - 15.5 % Final   Platelets 01/29/2024 239  150 - 400 K/uL Final   nRBC 01/29/2024 0.0  0.0 - 0.2 % Final   Neutrophils Relative %  01/29/2024 90  % Final   Neutro Abs 01/29/2024 7.6  1.7 - 7.7 K/uL Final   Lymphocytes Relative 01/29/2024 7  % Final   Lymphs Abs 01/29/2024 0.6 (L)  0.7 - 4.0 K/uL Final   Monocytes Relative 01/29/2024 3  % Final   Monocytes Absolute 01/29/2024 0.3  0.1 - 1.0 K/uL Final   Eosinophils Relative 01/29/2024 0  % Final   Eosinophils Absolute 01/29/2024 0.0  0.0 - 0.5 K/uL Final   Basophils Relative 01/29/2024 0  % Final   Basophils Absolute 01/29/2024 0.0  0.0 - 0.1 K/uL Final   Immature Granulocytes 01/29/2024 0  % Final   Abs Immature Granulocytes 01/29/2024 0.03  0.00 - 0.07 K/uL Final   Performed at [facility], [address] [place], [place] [address]   Salicylate Lvl 01/29/2024 <7.0 (L)  7.0 - 30.0 mg/dL Final   Performed at [facility], [address], [place], [place] [address]   Acetaminophen  (Tylenol ), Serum 01/29/2024 <10 (L)  10 - 30 ug/mL Final   Comment: (NOTE) Therapeutic concentrations vary significantly. A range of 10-30 ug/mL  may be an effective concentration for many patients. However, some  are best treated at concentrations outside of this range. Acetaminophen  concentrations >150 ug/mL at 4 hours after ingestion  and >50 ug/mL at 12 hours after ingestion are often associated with  toxic reactions.  Performed at [facility], [address] [place], [place] [address]    Alcohol, Ethyl (B) 01/29/2024 <15  <15 mg/dL Final   Comment: Please note change in reference range. (NOTE) For medical purposes only. Performed at [facility], [address] [place], [place] [address]   Admission on 01/28/2024, Discharged on 01/28/2024  Component Date Value Ref Range Status   Sodium 01/28/2024 138  135 - 145 mmol/L Final   Potassium 01/28/2024 3.3 (L)  3.5 - 5.1 mmol/L Final   Chloride 01/28/2024 107  98 - 111 mmol/L Final   CO2 01/28/2024 23  22 - 32 mmol/L Final   Glucose, Bld 01/28/2024 102 (H)  70 - 99 mg/dL Final   Glucose reference range  applies only to samples taken after fasting for at least 8 hours.   BUN 01/28/2024 19  6 - 20 mg/dL Final   Creatinine, Ser 01/28/2024 0.96  0.61 - 1.24 mg/dL Final   Calcium  01/28/2024 8.6 (L)  8.9 - 10.3 mg/dL Final   Total Protein 94/89/7974 6.8  6.5 - 8.1 g/dL Final   Albumin 94/89/7974 3.9  3.5 - 5.0 g/dL Final   AST 94/89/7974 39  15 - 41 U/L Final   ALT 01/28/2024 47 (H)  0 - 44 U/L Final   Alkaline Phosphatase 01/28/2024 52  38 -  126 U/L Final   Total Bilirubin 01/28/2024 0.6  0.0 - 1.2 mg/dL Final   GFR, Estimated 01/28/2024 >60  >60 mL/min Final   Comment: (NOTE) Calculated using the CKD-EPI Creatinine Equation (2021)    Anion gap 01/28/2024 8  5 - 15 Final   Performed at [facility], [address], [place], [place] [address]   Alcohol, Ethyl (B) 01/28/2024 <15  <15 mg/dL Final   Comment: Please note change in reference range. (NOTE) For medical purposes only. Performed at [facility], [address], [place], [place] [address]    WBC 01/28/2024 5.8  4.0 - 10.5 K/uL Final   RBC 01/28/2024 3.81 (L)  4.22 - 5.81 MIL/uL Final   Hemoglobin 01/28/2024 11.1 (L)  13.0 - 17.0 g/dL Final   HCT 94/89/7974 33.8 (L)  39.0 - 52.0 % Final   MCV 01/28/2024 88.7  80.0 - 100.0 fL Final   MCH 01/28/2024 29.1  26.0 - 34.0 pg Final   MCHC 01/28/2024 32.8  30.0 - 36.0 g/dL Final   RDW 94/89/7974 14.4  11.5 - 15.5 % Final   Platelets 01/28/2024 215  150 - 400 K/uL Final   nRBC 01/28/2024 0.0  0.0 - 0.2 % Final   Performed at [facility], [address] [place], [place] [address]   Opiates 01/28/2024 NONE DETECTED  NONE DETECTED Final   Cocaine 01/28/2024 POSITIVE (A)  NONE DETECTED Final   Benzodiazepines 01/28/2024 POSITIVE (A)  NONE DETECTED Final   Amphetamines 01/28/2024 POSITIVE (A)  NONE DETECTED Final   Comment: (NOTE) Trazodone  is metabolized in vivo to several metabolites, including pharmacologically active m-CPP,  which is excreted in the urine. Immunoassay screens for amphetamines and MDMA have potential cross-reactivity with these compounds and may provide false positive  results.     Tetrahydrocannabinol 01/28/2024 POSITIVE (A)  NONE DETECTED Final   Barbiturates 01/28/2024 NONE DETECTED  NONE DETECTED Final   Comment: (NOTE) DRUG SCREEN FOR MEDICAL PURPOSES ONLY.  IF CONFIRMATION IS NEEDED FOR ANY PURPOSE, NOTIFY LAB WITHIN 5 DAYS.  LOWEST DETECTABLE LIMITS FOR URINE DRUG SCREEN Drug Class                     Cutoff (ng/mL) Amphetamine and metabolites    1000 Barbiturate and metabolites    200 Benzodiazepine                 200 Opiates and metabolites        300 Cocaine and metabolites        300 THC                            50 Performed at [facility], [address], [place], [place] [address]    Salicylate Lvl 01/28/2024 <7.0 (L)  7.0 - 30.0 mg/dL Final   Performed at [facility], [address] [place], [place] [address]   Acetaminophen  (Tylenol ), Serum 01/28/2024 <10 (L)  10 - 30 ug/mL Final   Comment: (NOTE) Therapeutic concentrations vary significantly. A range of 10-30 ug/mL  may be an effective concentration for many patients. However, some  are best treated at concentrations outside of this range. Acetaminophen  concentrations >150 ug/mL at 4 hours after ingestion  and >50 ug/mL at 12 hours after ingestion are often associated with  toxic reactions.  Performed at [facility], [address] [place], [place]  [address]   Admission on 01/12/2024, Discharged on 01/13/2024  Component Date Value Ref Range Status   Sodium 01/12/2024 140  135 - 145 mmol/L Final   Potassium 01/12/2024 3.5  3.5 - 5.1 mmol/L Final   Chloride 01/12/2024 105  98 - 111 mmol/L Final   CO2 01/12/2024 23  22 - 32 mmol/L Final   Glucose, Bld 01/12/2024 163 (H)  70 - 99 mg/dL Final   Glucose reference range applies only to samples taken  after fasting for at least 8 hours.   BUN 01/12/2024 15  6 - 20 mg/dL Final   Creatinine, Ser 01/12/2024 1.21  0.61 - 1.24 mg/dL Final   Calcium  01/12/2024 9.8  8.9 - 10.3 mg/dL Final   Total Protein 95/75/7974 7.1  6.5 - 8.1 g/dL Final   Albumin 95/75/7974 4.2  3.5 - 5.0 g/dL Final   AST 95/75/7974 26  15 - 41 U/L Final   ALT 01/12/2024 24  0 - 44 U/L Final   Alkaline Phosphatase 01/12/2024 54  38 - 126 U/L Final   Total Bilirubin 01/12/2024 0.6  0.0 - 1.2 mg/dL Final   GFR, Estimated 01/12/2024 >60  >60 mL/min Final   Comment: (NOTE) Calculated using the CKD-EPI Creatinine Equation (2021)    Anion gap 01/12/2024 12  5 - 15 Final   Performed at [facility], [address], [place], [place] [address]   Alcohol, Ethyl (B) 01/12/2024 <15  <15 mg/dL Final   Comment: Please note change in reference range. (NOTE) For medical purposes only. Performed at [facility], [address], [place], [place] [address]    WBC 01/12/2024 11.9 (H)  4.0 - 10.5 K/uL Final   RBC 01/12/2024 5.30  4.22 - 5.81 MIL/uL Final   Hemoglobin 01/12/2024 15.3  13.0 - 17.0 g/dL Final   HCT 95/75/7974 46.8  39.0 - 52.0 % Final   MCV 01/12/2024 88.3  80.0 - 100.0 fL Final   MCH 01/12/2024 28.9  26.0 - 34.0 pg Final   MCHC 01/12/2024 32.7  30.0 - 36.0 g/dL Final   RDW 95/75/7974 13.5  11.5 - 15.5 % Final   Platelets 01/12/2024 307  150 - 400 K/uL Final   nRBC 01/12/2024 0.0  0.0 - 0.2 % Final   Performed at [facility], [address], [place], [place] [address]   Opiates 01/12/2024 NONE DETECTED  NONE DETECTED Final   Cocaine 01/12/2024 NONE DETECTED  NONE DETECTED Final   Benzodiazepines 01/12/2024 NONE DETECTED  NONE DETECTED Final   Amphetamines 01/12/2024 POSITIVE (A)  NONE DETECTED Final   Comment: (NOTE) Trazodone  is metabolized in vivo to several metabolites, including pharmacologically active m-CPP, which is excreted in the urine. Immunoassay screens for amphetamines and MDMA have  potential cross-reactivity with these compounds and may provide false positive  results.     Tetrahydrocannabinol 01/12/2024 POSITIVE (A)  NONE DETECTED Final   Barbiturates 01/12/2024 NONE DETECTED  NONE DETECTED Final   Comment: (NOTE) DRUG SCREEN FOR MEDICAL PURPOSES ONLY.  IF CONFIRMATION IS NEEDED FOR ANY PURPOSE, NOTIFY LAB WITHIN 5 DAYS.  LOWEST DETECTABLE LIMITS FOR URINE DRUG SCREEN Drug Class                     Cutoff (ng/mL) Amphetamine and metabolites    1000 Barbiturate and metabolites    200 Benzodiazepine                 200 Opiates and  metabolites        300 Cocaine and metabolites        300 THC                            50 Performed at [facility], [address], [place], [place] [address]     Blood Alcohol level:  Lab Results  Component Value Date   [facility] <15 06/13/2024   ETH <15 05/05/2024    Metabolic Disorder Labs: Lab Results  Component Value Date   HGBA1C 5.7 (H) 09/19/2023   MPG 117 09/19/2023   MPG 105.41 07/26/2018   Lab Results  Component Value Date   PROLACTIN 11.8 07/26/2018   Lab Results  Component Value Date   CHOL 177 09/02/2023   TRIG 70 09/02/2023   HDL 63 09/02/2023   CHOLHDL 2.8 09/02/2023   VLDL 14 09/02/2023   LDLCALC 100 (H) 09/02/2023   LDLCALC UNABLE TO CALCULATE IF TRIGLYCERIDE OVER 400 mg/dL 88/93/7980    Therapeutic Lab Levels: No results found for: LITHIUM No results found for: VALPROATE No results found for: CBMZ  Physical Findings   AIMS    Flowsheet Row [facility]  AIMS Total Score 0   AUDIT    Flowsheet Row Ad[facility]) [facility] Admission (Discharged) [facility] Admission (Discharged) from 08/31/2023 in [facility] Ad[facility] ([facility]  Alcohol Use Disorder Identification Test Final Score (AUDIT) 0 0 0 0   PHQ2-9    Flowsheet Row ED from 06/13/2024 in [facility]  PHQ-2 Total Score 6  PHQ-9 Total Score 16   Flowsheet Row ED from 06/13/2024 in [facility] Most recent reading at 06/13/2024 11:18 PM ED from 06/13/2024 in [facility] Emergency Department at [facility] Most recent reading at 06/13/2024  8:05 PM ED from 05/04/2024 in [facility] Emergency Department at [facility] Most recent reading at 05/04/2024 11:57 PM  C-SSRS RISK CATEGORY Low Risk High Risk Moderate Risk     Musculoskeletal  Strength & Muscle Tone: within normal limits Gait & Station: normal Patient leans: N/A  Psychiatric Specialty Exam  Presentation  General Appearance:  Disheveled  Eye Contact: Minimal  Speech: Clear and Coherent  Speech Volume: Normal  Handedness: Right   Mood and Affect  Mood: Dysphoric; Irritable; Hopeless  Affect: Congruent   Thought Process  Thought Processes: Coherent  Descriptions of Associations:Intact  Orientation:Full (Time, Place and Person)  Thought Content:Logical  Diagnosis of Schizophrenia or Schizoaffective disorder in past: No    Hallucinations:Hallucinations: None  Ideas of Reference:None  Suicidal Thoughts:Suicidal Thoughts: No  Homicidal Thoughts:Homicidal Thoughts: No   Sensorium  Memory: Immediate Fair; Recent Fair  Judgment: Fair  Insight: Present   Executive Functions  Concentration: Fair  Attention Span: Fair  Recall: [employer] of Knowledge: Fair  Language: Fair   Psychomotor Activity  Psychomotor Activity: Psychomotor Activity: Normal   Assets  Assets: Communication Skills   Sleep  Sleep: Sleep: Fair    No data recorded   Physical Exam  Physical Exam Eyes:     Conjunctiva/sclera: Conjunctivae normal.  Cardiovascular:      Rate and Rhythm: Normal rate.  Pulmonary:     Effort: Pulmonary effort is  normal.  Musculoskeletal:        General: Normal range of motion.     Cervical back: Normal range of motion.  Neurological:     Mental Status: He is alert and oriented to person, place, and time.    Review of Systems  Constitutional: Negative.   HENT: Negative.    Eyes: Negative.   Respiratory: Negative.    Cardiovascular: Negative.   Gastrointestinal: Negative.   Genitourinary: Negative.   Musculoskeletal: Negative.   Neurological: Negative.   Endo/Heme/Allergies: Negative.   Psychiatric/Behavioral:  Positive for depression and substance abuse. The patient is nervous/anxious.    Blood pressure 126/79, pulse 84, temperature 98.3 F (36.8 C), temperature source Oral, resp. rate 18, SpO2 99%. There is no height or weight on file to calculate BMI.  Treatment Plan Summary: Patient admitted to the [facility] for mood stabilization and substance abuse treatment.  Medication regimen  Continue Prozac  20 mg daily for depression Continue olanzapine  5 mg twice daily for psychosis and orexigen in the context of methamphetamine use   Labs UDS positive for amphetamine, benzodiazepine and THC BAL negative  Disposition: Patient declined referral for [facility]. Referrals made to [facility], [facility] awaiting action by patient. Anticipate discharge to community-based resources.   KANDI JAYSON HAHN, MD 06/15/2024 10:55 AM

## 2024-06-15 NOTE — ED Notes (Signed)
 Pt A&Ox4, calm & cooperative and in NAD at this time. Denies SI/HI/AVH. Contracts for safety. Encouragement and support given. Will continue to monitor.

## 2024-06-15 NOTE — Group Note (Signed)
 Group Topic: Positive Affirmations  Group Date: 06/15/2024 Start Time: 0815 End Time: 0850 Facilitators: Lonzell Dwayne RAMAN, NT  Department: [facility]  Number of Participants: 4  Group Focus: coping skills Treatment Modality:  Psychoeducation Interventions utilized were clarification Purpose: explore maladaptive thinking  Name: Trevor [name] Date of Birth: [birth year]  MR: [id number]    Level of Participation: Patient did not attend group Quality of Participation: N/A Interactions with others: N/A Mood/Affect: N/A Triggers (if applicable): N/A Cognition: N/A Progress: N/A Response: N/A Plan: N/A  Patients Problems:  Patient Active Problem List   Diagnosis Date Noted   Substance induced mood disorder (HCC) 06/13/2024   Suicidal ideations 06/13/2024   Psychosis (HCC) 02/24/2024   Malingering 02/24/2024   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, 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/06/2024   Bipolar 1 disorder, depressed (HCC) 01/30/2024   Methamphetamine dependence (HCC) 10/21/2023   History of intravenous drug abuse 10/19/2023   Osteomyelitis of left arm (HCC) 10/19/2023   Septic olecranon bursitis of right elbow 10/19/2023   Hypokalemia 10/14/2023   Normocytic anemia 10/14/2023   Polysubstance abuse (HCC) 10/14/2023   Cellulitis of right elbow 10/13/2023   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

## 2024-06-15 NOTE — Group Note (Signed)
 Group Topic: Relaxation  Group Date: 06/15/2024 Start Time: 0830 End Time: 0900 Facilitators: Nathanael Moulder, NT  Department: [facility]  Number of Participants: 5  Group Focus: art therapy, coping skills, and leisure skills Treatment Modality:  Art Therapy Interventions utilized were group exercise Purpose: enhance coping skills and express feelings  Name: Trevor [name] Date of Birth: 06-04-1981  MR: [id number]    Level of Participation: Denied when asked to join group Quality of Participation: Didn't attend Interactions with others: N/A Mood/Affect: N/A Triggers (if applicable): N/A Cognition: N/A Progress: Other Response: N/A Plan: patient will be encouraged to attend future groups  Patients Problems:  Patient Active Problem List   Diagnosis Date Noted   Substance induced mood disorder (HCC) 06/13/2024   Suicidal ideations 06/13/2024   Psychosis (HCC) 02/24/2024   Malingering 02/24/2024   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, 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/06/2024   Bipolar 1 disorder, depressed (HCC) 01/30/2024   Methamphetamine dependence (HCC) 10/21/2023   History of intravenous drug abuse 10/19/2023   Osteomyelitis of left arm (HCC) 10/19/2023   Septic olecranon bursitis of right elbow 10/19/2023   Hypokalemia 10/14/2023   Normocytic anemia 10/14/2023   Polysubstance abuse (HCC) 10/14/2023   Cellulitis of right elbow 10/13/2023   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

## 2024-06-15 NOTE — Discharge Instructions (Addendum)
 Based on the information that you have provided and the presenting issues outpatient services and resources for have been recommended.  It is imperative that you follow through with treatment recommendations within 5-7 days from the of discharge to mitigate further risk to your safety and mental well-being. A list of referrals has been provided below to get you started.  You are not limited to the list provided.  In case of an urgent crisis, you may contact the Mobile Crisis Unit with [facility] at [phone].  Substance Abuse Resources     SUBSTANCE USE TREATMENT for Medicaid and State Funded/IPRS  [facility] [address] [place], [place], [address] [phone] phone NOTE: [facility] is no longer offering IOP services.  Serves those who are low-income or have no insurance.  [facility] [address] [place], [place], [address] [phone] phone [phone] fax NOTE: Does have Substance Abuse-Intensive Outpatient Program [facility]) as well as transitional housing if eligible.  [facility] [address] [place], [place], [address] [phone] phone [phone] fax  [facility] [phone] [address] [place], [place], [address] [phone] phone [phone] fax    HALFWAY HOUSES:  [facility] [phone]  [email or web address]     12 STEP PROGRAMS:  Alcoholics Anonymous of [place] [employer]  Narcotics Anonymous of  [employer].dk  Al-Anon of [employer], [place] www.[place]alanon.org/find-meetings.html  Nar-Anon https://nar-anon.org/find-a-meetin      List of Residential placements:   [facility] in [place]: [phone]  [facility]: [phone]  [place], [place] [phone]: Male and male facility; 30-day program: (uninsured and Medicaid such as [place],  [place], [place], partners)  [facility]: [phone]; men and women's facility; 28 days; Can have Medicaid tailored plan Tour manager or Partners)  [facility]: [phone] [facility] or [place]; 28 day program; must be fully detox; tailored Medicaid or no insurance  [address] in [place], [place]; [phone]; 28 day all males program; no insurance accepted  [facility] Referral in [place]: [place] [phone] (no insurance or Medicaid only); 90 days; outpatient services but provide housing in apartments downtown [place]  [facility] Admission: [phone]: Patient must complete phone screening for placement: [place], [place]; 6 month program; uninsured, Medicaid, and [employer].   [facility]: no insurance required; [phone]  [facility]: [phone]; Intake: [place]; Must fill out application online; Steffan Delay [phone] x [phone] [facility] in [place], [place]: [phone]; Admissions Coordinators Mr. Marinda or Alm Eagles; 90 day program.  [facility]: [place], [place] [phone]; Co-Ed 9 month to a year program; Online application; Men entry fee is $500 (6-812months);  [employer]: [address] [place], [place] [address]; no fee or insurance required; minimum of 2 years; Highly structured; work based; Intake Coordinator is [place] [phone]  [facility] in [place], [place]: [phone]; Fax number is [phone]; website: www.[facility].org; Requires 3-6 page autobiography; 2 year program (18 months and then 12month transitional housing); Admission fee is $300; no insurance needed; work Automotive engineer in [place], [place]: [employer] Desk Staff: [place] [phone]: They have a Men's Regenerations Program 6-14months. Free program; There is an initial $300 fee however, they are willing to work with patients regarding that. Application is online.  [facility]: Admissions  [phone] [place] [phone]; Any 7-90 day program is out of pocket; 12 month program is free of charge; there is a $275 entry fee; Patient is responsible for own transportation    .SABRA  [facility]  [facility] [address] [place], [place], [address] [phone] phone  Offers food and emergency or transitional housing to men, women, or families in need. Clients participate in programs and workshops developed to promote self-sufficiency and personal development.Call or walk in. Applications are accepted Monday, Wednesday, and Friday by appointment only. Need photo ID and proof of income.  [facility] [address], [place], [place] [address] [phone] Population served: Adult men & women (48 years old and older, able to perform activities for daily living) Documents required: Valid ID & Social Security Card  [facility] [address] [place], [place]  [address] [phone] Population served: Families with children  [facility] [address] [place], [place]  [address] [phone] Population served: Single women 18+ without dependents Documents required:  Valid ID & Social Security Card  [facility] [address] [place], [place]  [address] [phone] Population served: Male veterans 18+ with substance abuse/mental health issues Eligibility: By referral only  [facility] [address], [place], [place] [address] [phone] Population served: Males 18+ Documents required: Valid ID & Social Security Card  [facility] at [employer] [facility] [place], [employer]. [address] [place] [address] [phone] or [phone] Population served: Pregnant women with or without children  Documents required: Valid ID & Social Security Ship broker of [employer] [address], [place], [place] [address] [phone] Population  Served: Families with children  [facility] [address] [place], [place] [address] [phone] Population served: Men 18+, preference for disabled and/or veterans Eligibility: By referral only  [facility]) - [facility] [address] [place], [place], [place] [address] [phone] or [phone] Population served: Families with children.    WOMEN ONLY  The Shelter serves up to 20 women each night. Open from December 11th through the end of March in the evenings from 5:30 pm until 7:30 am, the Shelter provides a hot evening meal, shower and sleeping facilities, and food for the next day. Secure parking is available beside the building.     Shelter Address   Directions [facility] [place] [place] Shelter [address] [place], [place]  If you are in need of housing through the shelter, contact the [employer] at [phone].

## 2024-06-16 DIAGNOSIS — F1310 Sedative, hypnotic or anxiolytic abuse, uncomplicated: Secondary | ICD-10-CM | POA: Diagnosis not present

## 2024-06-16 DIAGNOSIS — Z5900 Homelessness unspecified: Secondary | ICD-10-CM

## 2024-06-16 DIAGNOSIS — F411 Generalized anxiety disorder: Secondary | ICD-10-CM | POA: Diagnosis not present

## 2024-06-16 DIAGNOSIS — F1210 Cannabis abuse, uncomplicated: Secondary | ICD-10-CM | POA: Diagnosis not present

## 2024-06-16 DIAGNOSIS — F1529 Other stimulant dependence with unspecified stimulant-induced disorder: Secondary | ICD-10-CM | POA: Diagnosis not present

## 2024-06-16 MED ORDER — OLANZAPINE 5 MG PO TABS: 5.0000 mg | ORAL_TABLET | Freq: Two times a day (BID) | ORAL | 0 refills | Status: DC

## 2024-06-16 MED ORDER — FLUOXETINE HCL 20 MG PO CAPS: 20.0000 mg | ORAL_CAPSULE | Freq: Every day | ORAL | 0 refills | Status: DC

## 2024-06-16 NOTE — ED Notes (Signed)
 Pt became upset at time of discharge.  When asked to remove sheets from his bed, pt stated, I ain't doing that, f*ck you.  Pt began to walk up hallway away from this RN.  Escorted patient to locker to retreive belongings, pt began to curse while getting his belongings and throwing his items.  Security present at that time.  Pt was walked off of property by security staff

## 2024-06-16 NOTE — ED Notes (Signed)
 Pt observed lying in bed. Eyes closed respirations even and non labored. NAD q 15 minute observations continue for safety.

## 2024-06-16 NOTE — ED Provider Notes (Signed)
 FBC/OBS ASAP Discharge Summary  Date and Time: 06/16/2024 2:20 PM  Name: Trevor [name]  MRN:  [id number]   Discharge Diagnoses:  Final diagnoses:  Methamphetamine use disorder, severe, dependence (HCC)  Suicidal ideation  Substance induced mood disorder (HCC)  Cannabis abuse, continuous use  Homelessness unspecified  Hallucination  Benzodiazepine abuse (HCC)   Subjective: Patient endorses generally poor mood, anxiety, and physical condition. He reports prominent initial insomnia with a significant anxiety component. He has no plans for discharge. He claims only one group announced/available today and he did not attend. As for the prior day, he states that he didn't feel like going to groups but also complained that there's a lack of communication about groups. After being told there's a schedule, he tersely responded with expletives.   Stay Summary: Patient admitted for multimodal treatment of polysubstance use disorder, but patient only minimally engaged. He made no substantive effort to adjust to the milieu.   The patient was evaluated each day by a clinical provider to ascertain response to treatment. Incremental improvement was noted by the patient's report of decreasing symptoms, improved sleep and appetite, affect, medication tolerance, and behavior. Of note patient endorsed benefit of fluoxetine  and olanzapine . But after [facility], patient rejected offer and made minimal effort towards other programs.   Patient's care was discussed during the interdisciplinary team meeting every day during the hospitalization.  Patient was asked each day to complete a self inventory noting mood, mental status, pain, new symptoms, anxiety and concerns. Labs were reviewed with the patient, and abnormal results were discussed with the patient.  I personally spent 20 minutes on the unit in direct patient care. The direct patient care time included  face-to-face time with the patient, reviewing the patient's chart, communicating with other professionals, and coordinating care. Greater than 50% of this time was spent in counseling or coordinating care with the patient regarding goals of hospitalization, psycho-education, and discharge planning needs.   On my assessment the patient denied SI, HI, AVH, paranoia, ideas of reference, or first rank symptoms on day of discharge. Patient was quite irritable and cursed at physician. Patient complained that poor group participation was both a function of not being interested but also that he did not receive timely notification.  Patient denied medication side-effects. Patient was not deemed to be a danger to self or others on day of discharge and was in agreement with discharge plans.   Total Time spent with patient: I personally spent 40 minutes on the unit in direct patient care. The direct patient care time included face-to-face time with the patient, reviewing the patient's chart, communicating with other professionals, and coordinating care. Greater than 50% of this time was spent in counseling or coordinating care with the patient regarding goals of hospitalization, psycho-education, and discharge planning needs.  Past Psychiatric History: A psychiatric history of bipolar 1 disorder, GAD, homelessness, narcotic ideation, poorly substance abuse-cocaine, tobacco, methamphetamine, cannabis, psychosis, and suicidal ideations. Per chart review, patient presented earlier to the ED today endorsing suicidal ideations and auditory hallucinations in the context of meth amphetamine dependence. Patient has history of repeated ED presentations and multiple prior psychiatric hospitalizations. He is seeking psychiatric admission and referral to long-term residential program. Patient reports a history of substance treatments/detox at [facility] 19 years ago.    Past Medical History: No reported history.    Family Psychiatric   History: No reported history.    Social History: He reports sheltered homelessness and  states I stay with a friend.  Current Medications:  Current Facility-Administered Medications  Medication Dose Route Frequency Provider Last Rate Last Admin   alum & mag hydroxide-simeth (MAALOX/MYLANTA) 200-200-20 MG/5ML suspension 30 mL  30 mL Oral Q4H PRN Coleman, Carolyn H, NP       dicyclomine  (BENTYL ) tablet 20 mg  20 mg Oral Q6H PRN [name], Chinwendu V, NP       haloperidol  (HALDOL ) tablet 5 mg  5 mg Oral TID PRN Mardy Elveria DEL, NP       And   diphenhydrAMINE  (BENADRYL ) capsule 50 mg  50 mg Oral TID PRN Mardy Elveria DEL, NP       haloperidol  lactate (HALDOL ) injection 5 mg  5 mg Intramuscular TID PRN Mardy Elveria DEL, NP       And   diphenhydrAMINE  (BENADRYL ) injection 50 mg  50 mg Intramuscular TID PRN Mardy Elveria DEL, NP       And   LORazepam  (ATIVAN ) injection 2 mg  2 mg Intramuscular TID PRN Coleman, Carolyn H, NP       haloperidol  lactate (HALDOL ) injection 10 mg  10 mg Intramuscular TID PRN Mardy Elveria DEL, NP       And   diphenhydrAMINE  (BENADRYL ) injection 50 mg  50 mg Intramuscular TID PRN Mardy Elveria DEL, NP       And   LORazepam  (ATIVAN ) injection 2 mg  2 mg Intramuscular TID PRN Mardy Elveria DEL, NP       FLUoxetine  (PROZAC ) capsule 20 mg  20 mg Oral Daily Mardy Elveria DEL, NP   20 mg at 06/16/24 9071   hydrOXYzine  (ATARAX ) tablet 25 mg  25 mg Oral Q6H PRN [name], Chinwendu V, NP   25 mg at 06/15/24 2109   loperamide  (IMODIUM ) capsule 2-4 mg  2-4 mg Oral PRN [name], Chinwendu V, NP       magnesium  hydroxide (MILK OF MAGNESIA) suspension 30 mL  30 mL Oral Daily PRN Coleman, Carolyn H, NP       methocarbamol  (ROBAXIN ) tablet 500 mg  500 mg Oral Q8H PRN [name], Chinwendu V, NP       naproxen  (NAPROSYN ) tablet 500 mg  500 mg Oral BID PRN [name], Chinwendu V, NP   500 mg at 06/15/24 1713   OLANZapine  (ZYPREXA ) tablet 5 mg  5 mg Oral BID Coleman, Carolyn H, NP   5 mg  at 06/16/24 9071   ondansetron  (ZOFRAN -ODT) disintegrating tablet 4 mg  4 mg Oral Q6H PRN [name], Chinwendu V, NP       traZODone  (DESYREL ) tablet 50 mg  50 mg Oral QHS PRN Mardy Elveria DEL, NP       Current Outpatient Medications  Medication Sig Dispense Refill   [START ON 06/17/2024] FLUoxetine  (PROZAC ) 20 MG capsule Take 1 capsule (20 mg total) by mouth daily. 30 capsule 0   OLANZapine  (ZYPREXA ) 5 MG tablet Take 1 tablet (5 mg total) by mouth 2 (two) times daily. 60 tablet 0    PTA Medications:  Facility Ordered Medications  Medication   [COMPLETED] OLANZapine  (ZYPREXA ) tablet 5 mg   FLUoxetine  (PROZAC ) capsule 20 mg   OLANZapine  (ZYPREXA ) tablet 5 mg   traZODone  (DESYREL ) tablet 50 mg   alum & mag hydroxide-simeth (MAALOX/MYLANTA) 200-200-20 MG/5ML suspension 30 mL   magnesium  hydroxide (MILK OF MAGNESIA) suspension 30 mL   haloperidol  (HALDOL ) tablet 5 mg   And   diphenhydrAMINE  (BENADRYL ) capsule 50 mg   haloperidol  lactate (HALDOL ) injection 5 mg  And   diphenhydrAMINE  (BENADRYL ) injection 50 mg   And   LORazepam  (ATIVAN ) injection 2 mg   haloperidol  lactate (HALDOL ) injection 10 mg   And   diphenhydrAMINE  (BENADRYL ) injection 50 mg   And   LORazepam  (ATIVAN ) injection 2 mg   dicyclomine  (BENTYL ) tablet 20 mg   hydrOXYzine  (ATARAX ) tablet 25 mg   loperamide  (IMODIUM ) capsule 2-4 mg   methocarbamol  (ROBAXIN ) tablet 500 mg   naproxen  (NAPROSYN ) tablet 500 mg   ondansetron  (ZOFRAN -ODT) disintegrating tablet 4 mg   PTA Medications  Medication Sig   [START ON 06/17/2024] FLUoxetine  (PROZAC ) 20 MG capsule Take 1 capsule (20 mg total) by mouth daily.   OLANZapine  (ZYPREXA ) 5 MG tablet Take 1 tablet (5 mg total) by mouth 2 (two) times daily.       06/13/2024   10:28 PM  Depression screen PHQ 2/9  Decreased Interest 3  Down, Depressed, Hopeless 3  PHQ - 2 Score 6  Altered sleeping 0  Tired, decreased energy 2  Change in appetite 0  Feeling bad or failure about  yourself  2  Trouble concentrating 3  Moving slowly or fidgety/restless 0  Suicidal thoughts 3  PHQ-9 Score 16  Difficult doing work/chores Not difficult at all    Flowsheet Row ED from 06/13/2024 in [facility] Most recent reading at 06/13/2024 11:18 PM ED from 06/13/2024 in [facility] Department at [facility] Most recent reading at 06/13/2024  8:05 PM ED from 05/04/2024 in [facility] at [facility] Most recent reading at 05/04/2024 11:57 PM  C-SSRS RISK CATEGORY Low Risk High Risk Moderate Risk    Musculoskeletal  Strength & Muscle Tone: within normal limits Gait & Station: normal Patient leans: N/A  Psychiatric Specialty Exam  Presentation  General Appearance:  Disheveled  Eye Contact: Minimal  Speech: Clear and Coherent  Speech Volume: Normal  Handedness: Right   Mood and Affect  Mood: Irritable; Dysphoric; Anxious; Angry  Affect: Congruent  Thought Process  Thought Processes: Coherent  Descriptions of Associations:Intact  Orientation:Full (Time, Place and Person)  Thought Content:Logical  Diagnosis of Schizophrenia or Schizoaffective disorder in past: No    Hallucinations:Hallucinations: None  Ideas of Reference:None  Suicidal Thoughts:Suicidal Thoughts: No  Homicidal Thoughts:Homicidal Thoughts: No  Sensorium  Memory: Immediate Fair; Recent Fair  Judgment: Fair  Insight: Present  Executive Functions  Concentration: Fair  Attention Span: Fair  Recall: Fair  Fund of Knowledge: Fair  Language: Fair  Psychomotor Activity  Psychomotor Activity: Psychomotor Activity: Normal   Assets  Assets: Communication Skills  Sleep  Sleep: Sleep: Poor  Estimated Sleeping Duration (Last 24 Hours): 20.50-21.25 hours  No data recorded  Physical Exam  Physical Exam ROS Blood pressure (!) 114/57, pulse 92, temperature 98 F (36.7 C), temperature  source Temporal, resp. rate 16, SpO2 98%. There is no height or weight on file to calculate BMI.  Demographic Factors:  Male  Loss Factors: Loss of significant relationship  Historical Factors: Family history of mental illness or substance abuse  Risk Reduction Factors:   NA  Continued Clinical Symptoms:  Alcohol/Substance Abuse/Dependencies  Cognitive Features That Contribute To Risk:  Polarized thinking    Suicide Risk:  Minimal: No identifiable suicidal ideation.  Patients presenting with no risk factors but with morbid ruminations; may be classified as minimal risk based on the severity of the depressive symptoms  Plan Of Care/Follow-up recommendations:  Activity: Daily physical activity; reduce sedentary behaviors  Diet: Portion-limited diet rich in produce, whole (minimally processed) grains, nuts/seeds, beans/legumes, seafood, lowfat dairy (if tolerated), fermented food, lean meat. Copious water  intake. Limit (ultra)processed foods and sugar-sweetened beverages.    Other: -Follow-up with your outpatient psychiatric provider -instructions on appointment date, time, and address (location) are provided to you in discharge paperwork.    -Take your psychiatric medications as prescribed at discharge - instructions are provided to you in the discharge paperwork   -Follow-up with outpatient primary care doctor to optimize health maintenance/preventative care and other specialists -for management of chronic medical disease   -If you are prescribed an atypical antipsychotic (olanzapine /Zyprexa ) medication, we recommend that your outpatient psychiatrist follow routine screening for side effects within 3 months of discharge, including monitoring: AIMS scale, height, weight, blood pressure, fasting lipid panel, HbA1c, and fasting blood sugar.    -Recommend total abstinence from alcohol, tobacco, and other illicit drug use at discharge.    -If your psychiatric symptoms recur, worsen,  or if you have side effects to your psychiatric medications, call your outpatient psychiatric provider, 911, 988 or go to the nearest emergency department.   -If suicidal thoughts occur, immediately call your outpatient psychiatric provider, 911, 988 or go to the nearest emergency department.  Disposition: Discharge to community.  KANDI JAYSON HAHN, MD 06/16/2024, 2:20 PM

## 2024-06-16 NOTE — ED Notes (Signed)
 Pt in bed w/eyes closed flexing feet back and forth. Respirations equal and unlabored. No signs or symptoms of distress.  Denies needs. Routine safety checks maintained/ongoing.

## 2024-06-27 ENCOUNTER — Ambulatory Visit (HOSPITAL_COMMUNITY)
Admission: EM | Admit: 2024-06-27 | Discharge: 2024-06-27 | Disposition: A | Discharge status: Home / Self Care | Payer: MEDICAID

## 2024-06-27 ENCOUNTER — Other Ambulatory Visit: Payer: Self-pay

## 2024-06-27 ENCOUNTER — Emergency Department (HOSPITAL_COMMUNITY)
Admission: EM | Admit: 2024-06-27 | Discharge: 2024-06-27 | Disposition: A | Discharge status: Home / Self Care | Payer: MEDICAID | Attending: Emergency Medicine | Admitting: Emergency Medicine

## 2024-06-27 ENCOUNTER — Encounter (HOSPITAL_COMMUNITY): Payer: Self-pay

## 2024-06-27 DIAGNOSIS — F411 Generalized anxiety disorder: Secondary | ICD-10-CM | POA: Insufficient documentation

## 2024-06-27 DIAGNOSIS — R45851 Suicidal ideations: Secondary | ICD-10-CM | POA: Insufficient documentation

## 2024-06-27 DIAGNOSIS — F319 Bipolar disorder, unspecified: Secondary | ICD-10-CM | POA: Insufficient documentation

## 2024-06-27 DIAGNOSIS — G4700 Insomnia, unspecified: Secondary | ICD-10-CM | POA: Insufficient documentation

## 2024-06-27 DIAGNOSIS — Z5901 Sheltered homelessness: Secondary | ICD-10-CM | POA: Insufficient documentation

## 2024-06-27 DIAGNOSIS — F1520 Other stimulant dependence, uncomplicated: Secondary | ICD-10-CM | POA: Insufficient documentation

## 2024-06-27 MED ORDER — FLUOXETINE HCL 20 MG PO CAPS: 20.0000 mg | ORAL_CAPSULE | Freq: Every day | ORAL | 0 refills | Status: DC

## 2024-06-27 MED ORDER — OLANZAPINE 5 MG PO TABS: 5.0000 mg | ORAL_TABLET | Freq: Two times a day (BID) | ORAL | 0 refills | Status: DC

## 2024-06-27 MED ORDER — OLANZAPINE 5 MG PO TABS
5.0000 mg | ORAL_TABLET | Freq: Two times a day (BID) | ORAL | Status: DC
  Filled 2024-06-27: qty 6

## 2024-06-27 MED ORDER — FLUOXETINE HCL 20 MG PO CAPS
20.0000 mg | ORAL_CAPSULE | Freq: Every day | ORAL | Status: DC
  Filled 2024-06-27: qty 3

## 2024-06-27 NOTE — ED Notes (Signed)
 Patient hostile, throws objects at nursing staff.

## 2024-06-27 NOTE — ED Notes (Signed)
 Pt provided with sample of home medications and provided with bus pass

## 2024-06-27 NOTE — ED Triage Notes (Signed)
 Pt states that he wants help to stop meth. Last use last night. Pt also reports needing some mental health medications.

## 2024-06-27 NOTE — Progress Notes (Signed)
   06/27/24 0850  [facility] Triage Screening (Walk-ins at [facility] only)  How Did You Hear About Us ? Legal System  What Is the Reason for Your Visit/Call Today? Urgent: Trevor [name] 43Y male [facility] via [facility]. PT has a bipolar diagnosis and currently is not taking his medication, last dose taken was 2 weeks ago. PT stated that he believes all of his meth use has caused him to develop schizophrenia. PT reports using 1g of meth and smoking a marijuana vape within the past 24hrs. PT states that he constantly thinks about killing himself; when asked if he had a plan, pt stated that he would probably use fentanyl  that's the easiest way. PT states that he is currently hearing different voices but cannot understand what they are saying. PT endorses SI with plan, and AH. PT denies homicidal ideation, but admitted to wanting to punch someone, no plan. PT stated that he is open to therapy. PT expresses that he is just tired and wants to get help.  How Long Has This Been Causing You Problems? > than 6 months  Are You Planning to Commit Suicide/Harm Yourself At This time? Yes (Today)  Have you Recently Had Thoughts About Hurting Someone [name]? Yes  How long ago did you have thoughts of harming others? Today, but not homicidal  Are You Planning To Harm Someone At This Time? No  Physical Abuse Denies  Verbal Abuse Yes, past (Comment);Yes, present (Comment)  Sexual Abuse Denies  Exploitation of patient/patient's resources Denies  Self-Neglect Yes, present (Comment)  Are you currently experiencing any auditory, visual or other hallucinations? Yes  Please explain the hallucinations you are currently experiencing: auditory, voices of males and females, pt cannot decipher what the voices are saying  Have You Used Any Alcohol or Drugs in the Past 24 Hours? Yes  What Did You Use and How Much? Meth (1g) and used marijuana vape  Do you have any current medical co-morbidities that require immediate attention? No   Clinician description of patient physical appearance/behavior: poorly groomed, cooperative, tearful, calm  What Do You Feel Would Help You the Most Today? Treatment for Depression or other mood problem;Alcohol or Drug Use Treatment;Social Support;Financial Resources;Medication(s);Transportation Assistance  Determination of Need Urgent (48 hours)  Options For Referral [facility] Urgent Care;Facility-Based Crisis;Intensive Outpatient Therapy;Medication Management;Outpatient Therapy;Inpatient Hospitalization  Determination of Need filed? Yes

## 2024-06-27 NOTE — ED Provider Notes (Signed)
 [place] EMERGENCY DEPARTMENT AT [facility] Provider Note   CSN: [id number] Arrival date & time: 06/27/24  9266     Patient presents with: Drug Problem   Trevor [name] is a 43 y.o. male.   43 yo M with a chief complaints of wanting to stop using methamphetamines.  Patient has struggled with this off and on.  He last use last night.  He is here to try and get some help.  He also has run out of his mental health medications at home.  Would like a prescription for olanzapine  and Prozac .  Patient has no medical complaint denies chest pain difficulty breathing cough congestion fever abdominal pain nausea vomiting or diarrhea.   Drug Problem       Prior to Admission medications   Medication Sig Start Date End Date Taking? Authorizing Provider  FLUoxetine  (PROZAC ) 20 MG capsule Take 1 capsule (20 mg total) by mouth daily. 06/27/24   Emil Share, DO  OLANZapine  (ZYPREXA ) 5 MG tablet Take 1 tablet (5 mg total) by mouth 2 (two) times daily. 06/27/24   Emil Share, DO    Allergies: Penicillins and Augmentin [amoxicillin-pot clavulanate]    Review of Systems  Updated Vital Signs BP (!) 122/96 (BP Location: Left Arm)   Pulse 87   Resp 20   SpO2 99%   Physical Exam Vitals and nursing note reviewed.  Constitutional:      Appearance: He is well-developed.  HENT:     Head: Normocephalic and atraumatic.  Eyes:     Pupils: Pupils are equal, round, and reactive to light.  Neck:     Vascular: No JVD.  Cardiovascular:     Rate and Rhythm: Normal rate and regular rhythm.     Heart sounds: No murmur heard.    No friction rub. No gallop.  Pulmonary:     Effort: No respiratory distress.     Breath sounds: No wheezing.  Abdominal:     General: There is no distension.     Tenderness: There is no abdominal tenderness. There is no guarding or rebound.  Musculoskeletal:        General: Normal range of motion.     Cervical back: Normal range of motion and neck supple.   Skin:    Coloration: Skin is not pale.     Findings: No rash.  Neurological:     Mental Status: He is alert and oriented to person, place, and time.  Psychiatric:        Behavior: Behavior normal.     (all labs ordered are listed, but only abnormal results are displayed) Labs Reviewed - No data to display  EKG: None  Radiology: No results found.   Procedures   Medications Ordered in the ED - No data to display                                  Medical Decision Making Risk Prescription drug management.   43 yo M with a cc of wanting rehab for methamphetamines.  I discussed with him that we do not detox people through the hospital system and have not for the better part of a decade and a half.  He was given information to go to the health urgent care versus going to our current list of possible rehab facilities.  I will represcribe the patient's medications as he said he has run out of them.  This seems a bit unlikely as he was just hospitalized less than a couple weeks ago.  Patient has no medical complaints.  Feel he is medically clear for any psychiatric facility.  7:50 AM:  I have discussed the diagnosis/risks/treatment options with the patient.  Evaluation and diagnostic testing in the emergency department does not suggest an emergent condition requiring admission or immediate intervention beyond what has been performed at this time.  They will follow up with PCP, outpatient rehab. We also discussed returning to the ED immediately if new or worsening sx occur. We discussed the sx which are most concerning (e.g., sudden worsening pain, fever, inability to tolerate by mouth) that necessitate immediate return. Medications administered to the patient during their visit and any new prescriptions provided to the patient are listed below.  Medications given during this visit Medications - No data to display   The patient appears reasonably screen and/or stabilized for  discharge and I doubt any other medical condition or other [facility] requiring further screening, evaluation, or treatment in the ED at this time prior to discharge.       Final diagnoses:  Methamphetamine dependence [facility])    ED Discharge Orders          Ordered    FLUoxetine  (PROZAC ) 20 MG capsule  Daily        06/27/24 0747    OLANZapine  (ZYPREXA ) 5 MG tablet  2 times daily        06/27/24 0747               Emil Share, DO 06/27/24 [phone]

## 2024-06-27 NOTE — ED Provider Notes (Cosign Needed Addendum)
 Behavioral Health Urgent Care Medical Screening Exam  Patient Name: Trevor [name] MRN: [id number] Date of Evaluation: 07/06/24 Chief Complaint:   Diagnosis:  Final diagnoses:  Methamphetamine dependence (HCC)    History of Present illness: Patient is a 43 year-old male with a hx of methamphetamine dependence who presents with  anxiety, reporting that he needs help with his meth abuse.  He reports experiencing insomnia with a significant anxiety and restlessness.  Patient was recently  admitted for multimodal treatment of polysubstance use disorder, but his motivation was minimal. He made no substantive effort to adjust to the milieu and work on his problem.  He was  accepted to [facility] for residential treatment;  however, patient rejected the  offer and made minimal effort towards other programs.   Patient presents with a psychiatric history of bipolar 1 disorder, GAD, homelessness, narcotic ideation, poorly substance abuse-cocaine, tobacco, methamphetamine, cannabis, psychosis, and suicidal ideations. Per chart review, patient has had multiple visits to ED  endorsing suicidal ideations and auditory hallucinations in the context of meth amphetamine dependence. Patient reports a history of substance treatments/detox at [facility] 19 years ago.    Upon face-to-face  evaluation today,  patient denied SI, HI, AVH, paranoia, ideas of reference. Patient is quite irritable and cursing  at staff. He is complaining  that no one wants to help me, if I go back to that place I will get high again.  He does report not taking his medications and does not give any reason. Reports he has a place to stay at but people use drugs there that's how I get high. When discussing about rehab programs, patient becomes guarded and expresses no interest. States he wants to stay here, does not want to go back to his home because last time, I went, and used again. [facility] reports not having outpatient services.  He is  recommended to follow up at [facility] and medications samples provided.  Additional resources provided.      Flowsheet Row ED from 06/27/2024 in [facility] Most recent reading at 06/27/2024  9:14 AM ED from 06/27/2024 in [facility] Emergency Department at [facility] Most recent reading at 06/27/2024  7:39 AM ED from 06/13/2024 in [facility] Most recent reading at 06/13/2024 11:18 PM  C-SSRS RISK CATEGORY High Risk No Risk Low Risk    Psychiatric Specialty Exam  Presentation  General Appearance:Casual  Eye Contact:Minimal  Speech:Clear and Coherent  Speech Volume:Normal  Handedness:Right   Mood and Affect  Mood: Dysphoric; Irritable  Affect: Congruent   Thought Process  Thought Processes: Coherent  Descriptions of Associations:Intact  Orientation:Full (Time, Place and Person)  Thought Content:Logical  Diagnosis of Schizophrenia or Schizoaffective disorder in past: No   Hallucinations:None Pt reports hearing mumblimg voices.  Ideas of Reference:None  Suicidal Thoughts:No Without Intent; Without Plan Without Plan  Homicidal Thoughts:No   Sensorium  Memory: Immediate Fair; Recent Fair; Remote Fair  Judgment: Fair  Insight: Present   Executive Functions  Concentration: Fair  Attention Span: Fair  Recall: [employer] of Knowledge: Fair  Language: Fair   Psychomotor Activity  Psychomotor Activity: Normal   Assets  Assets: Manufacturing systems engineer; Physical Health; Desire for Improvement   Sleep  Sleep: Fair  Number of hours:  5   Physical Exam: Physical Exam Vitals and nursing note reviewed.  HENT:     Head: Normocephalic and atraumatic.     Right Ear: Tympanic membrane normal.  Left Ear: Tympanic membrane normal.     Nose: Nose normal.     Mouth/Throat:     Mouth: Mucous membranes are moist.  Eyes:     Extraocular Movements: Extraocular movements  intact.     Pupils: Pupils are equal, round, and reactive to light.  Cardiovascular:     Rate and Rhythm: Normal rate.     Pulses: Normal pulses.  Pulmonary:     Effort: Pulmonary effort is normal.  Musculoskeletal:        General: Normal range of motion.     Cervical back: Normal range of motion and neck supple.  Neurological:     General: No focal deficit present.     Mental Status: He is alert and oriented to person, place, and time.  Psychiatric:        Thought Content: Thought content normal.    Review of Systems  Constitutional: Negative.   HENT: Negative.    Eyes: Negative.   Respiratory: Negative.    Cardiovascular: Negative.   Gastrointestinal: Negative.   Genitourinary: Negative.   Musculoskeletal: Negative.   Skin: Negative.   Neurological: Negative.   Endo/Heme/Allergies: Negative.   Psychiatric/Behavioral:  Positive for depression and substance abuse. The patient is nervous/anxious.    Blood pressure 124/80, pulse 66, temperature (!) 97.5 F (36.4 C), temperature source Oral, resp. rate 16, SpO2 100%. There is no height or weight on file to calculate BMI.  Musculoskeletal: Strength & Muscle Tone: within normal limits Gait & Station: normal Patient leans: N/A   [facility] MSE Discharge Disposition for Follow up and Recommendations: Based on my evaluation the patient does not appear to have an emergency medical condition and can be discharged with resources and follow up care in outpatient services for Medication Management, Substance Abuse Intensive Outpatient Program, Individual Therapy, and Group Therapy   Randall Bouquet, NP 07/06/2024, 11:31 PM

## 2024-06-27 NOTE — Discharge Instructions (Signed)

## 2024-06-27 NOTE — ED Notes (Signed)
 Patient discharge instructions reviewed.  Patient threw ice water  on nurse and threw BP cuff on floor stating I have never seen shit like this before.   Patient angry with medical decision.   Patient then threw discharge paperwork on bed and had to be directed verbally to leave premises.

## 2024-06-27 NOTE — Discharge Instructions (Addendum)
 I would like to see what help.  Please go to one of the centers today.

## 2024-06-27 NOTE — ED Notes (Signed)
 Patient given urinal, states used restroom in lobby.  Patient given water  and encouraged to provide specimen as soon as possible.

## 2024-07-09 ENCOUNTER — Ambulatory Visit (HOSPITAL_COMMUNITY)
Admission: EM | Admit: 2024-07-09 | Discharge: 2024-07-09 | Disposition: A | Discharge status: Home / Self Care | Payer: MEDICAID | Attending: Psychiatry | Admitting: Psychiatry

## 2024-07-09 DIAGNOSIS — Z79899 Other long term (current) drug therapy: Secondary | ICD-10-CM | POA: Insufficient documentation

## 2024-07-09 DIAGNOSIS — R454 Irritability and anger: Secondary | ICD-10-CM | POA: Insufficient documentation

## 2024-07-09 DIAGNOSIS — Z5900 Homelessness unspecified: Secondary | ICD-10-CM | POA: Insufficient documentation

## 2024-07-09 DIAGNOSIS — R45850 Homicidal ideations: Secondary | ICD-10-CM | POA: Insufficient documentation

## 2024-07-09 NOTE — BH Assessment (Addendum)
 Comprehensive Clinical Assessment (CCA) Note  07/09/2024 Trevor [name] [id number]  Disposition: PerTosin Olasunkanmi, NP patient does not meet inpatient criteria.  Outpatient MH and SA treatment is recommended. Patient provided with refills and referral information for outpatient providers.   The patient demonstrates the following risk factors for suicide: Chronic risk factors for suicide include: psychiatric disorder of Bipolar Disorder and substance use disorder. Acute risk factors for suicide include: social withdrawal/isolation and loss (financial, interpersonal, professional). Protective factors for this patient include: positive social support, positive therapeutic relationship, and hope for the future. Considering these factors, the overall suicide risk at this point appears to be low. Patient is appropriate for outpatient follow up.   Patient is a 43 y.o. male with a hx of Bipolar and Stimlant Use Disroder (meth type) who presents voluntarily requesting a refill of his medications, stating he has never been followed by an outpatient provider, stating he struggles to follow up.  Patient reports he has been struggling with a meth addiction for 10 years.  He reports he uses appoximately a gram IV daily with last use of 2-3 points (2/10 or 3/10 of gram) today.  He states he knows he needs to detox and stay clean, however does not believe he would be considered for admission here.  He states the last time he was here, he was turned away when they didn't have beds.  He states he got angry and cursed at the security, same security working today.  Patient states he apologized to the security officer after the incident last time he was here.  Patient is open to [facility] admission, if considered.  He is also seeking a refill of medications, stating he only got 3 days worth the last time he was seen here and he ran out a week ago.  Patient has yet to follow up and is often admitted to inpatient programs to  stabalize and get Rx refills on d/c.  He states he would follow up with outpatient psychiatry, if referred.   Chief Complaint: No chief complaint on file.  Visit Diagnosis: Bipolar Disorder                             Stimulant Use Disorder, methamphetamine type    CCA Screening, Triage and Referral (STR)  Patient Reported Information How did you hear about us ? Self  What Is the Reason for Your Visit/Call Today? Patient is a 43 y.o. male with a hx of Bipolar and Stimlant Use Disroder (meth type) who presents voluntarily requesting a refill of his medications, stating he has never been followed by an outpatient provider, stating he struggles to follow up.  Patient reports he has been struggling with a meth addiction for 10 years.  He reports he uses appoximately a gram IV daily with last use of 2-3 points (2/10 or 3/10 of gram) today.  He states he knows he needs to detox and stay clean, however does not believe he would be considered for admission here.  He states the last time he was here, he was turned away when they didn't have beds.  He states he got angry and cursed at the security, same security working today.  Patient states he apologized to the security officer after the incident last time he was here.  Patient is open to [facility] admission, if considered.  He is also seeking a refill of medications, stating he only got 3 days worth the last time he  was seen here and he ran out a week ago.  Patient has yet to follow up and is often admitted to inpatient programs to stabalize and get Rx refills on d/c.  He states he would follow up with outpatient psychiatry, if referred.  How Long Has This Been Causing You Problems? > than 6 months  What Do You Feel Would Help You the Most Today? Alcohol or Drug Use Treatment; Treatment for Depression or other mood problem; Medication(s)   Have You Recently Had Any Thoughts About Hurting Yourself? No  Are You Planning to Commit Suicide/Harm Yourself At  This time? No   Flowsheet Row ED from 06/27/2024 in [facility] Most recent reading at 06/27/2024  9:14 AM ED from 06/27/2024 in [facility] at [facility] Most recent reading at 06/27/2024  7:39 AM ED from 06/13/2024 in [facility] Most recent reading at 06/13/2024 11:18 PM  C-SSRS RISK CATEGORY High Risk No Risk Low Risk    Have you Recently Had Thoughts About Hurting Someone [name]? No  Are You Planning to Harm Someone at This Time? No  Explanation: N/A   Have You Used Any Alcohol or Drugs in the Past 24 Hours? Yes  How Long Ago Did You Use Drugs or Alcohol? Prior to arrival  What Did You Use and How Much? 2-3 points of meth earlier today   Do You Currently Have a Therapist/Psychiatrist? No  Name of Therapist/Psychiatrist:    Have You Been Recently Discharged From Any Office Practice or Programs? No  Explanation of Discharge From Practice/Program: Pt was discharged from [facility] ED for similar presentation on 03/15/2024. Per chart, pt at [facility] from 02/24/2024-03/05/2024.     CCA Screening Triage Referral Assessment Type of Contact: Face-to-Face  Telemedicine Service Delivery:   Is this Initial or Reassessment?   Date Telepsych consult ordered in [facility]:    Time Telepsych consult ordered in [facility]:    Location of Assessment: [facility] Assessment Services  Provider Location: [facility] Assessment Services   Collateral Involvement: No collateral contacts provided.   Does Patient Have a Automotive engineer Guardian? No  Legal Guardian Contact Information: N/A  Copy of Legal Guardianship Form: -- (N/A)  Legal Guardian Notified of Arrival: -- (N/A)  Legal Guardian Notified of Pending Discharge: -- (N/A)  If Minor and Not Living with Parent(s), Who has Custody? N/A  Is CPS involved or ever been involved? Never  Is APS involved or ever been involved? Never   Patient Determined To  Be At Risk for Harm To Self or Others Based on Review of Patient Reported Information or Presenting Complaint? No  Method: -- (N/A, no HI)  Availability of Means: -- (N/A, no HI)  Intent: -- (N/A, no HI)  Notification Required: -- (N/A, no HI)  Additional Information for Danger to Others Potential: -- (N/A, no HI)  Additional Comments for Danger to Others Potential: N/A, no HI  Are There Guns or Other Weapons in Your Home? No  Types of Guns/Weapons: N/A  Are These Weapons Safely Secured?                            -- (N/A)  Who Could Verify You Are Able To Have These Secured: N/A  Do You Have any Outstanding Charges, Pending Court Dates, Parole/Probation? Per EHR, pt has reported he has a gun charge and has ct date 07/19/24  Contacted To Inform of Risk of Harm To Self or Others: -- (N/A)    Does Patient Present under Involuntary Commitment? No    [place] of Residence: [place]   Patient Currently Receiving the Following Services: Not Receiving Services   Determination of Need: Urgent (48 hours)   Options For Referral: Chemical Dependency Intensive Outpatient Therapy (CDIOP); Inpatient Hospitalization; [facility] Urgent Care; Facility-Based Crisis     CCA Biopsychosocial Patient Reported Schizophrenia/Schizoaffective Diagnosis in Past: No   Strengths: Pt is seeking detox and states he wants to f/u with outpatient services, as he has not in the past.   Mental Health Symptoms Depression:  Change in energy/activity; Hopelessness; Worthlessness (Pt denies, depression symptoms.)   Duration of Depressive symptoms: Duration of Depressive Symptoms: Greater than two weeks   Mania:  None   Anxiety:   Irritability (Pt denies, anxiety symptoms.)   Psychosis:  None   Duration of Psychotic symptoms:    Trauma:  None (UTA)   Obsessions:  None   Compulsions:  None   Inattention:  N/A   Hyperactivity/Impulsivity:  N/A   Oppositional/Defiant Behaviors:  Temper; N/A    Emotional Irregularity:  Chronic feelings of emptiness   Other Mood/Personality Symptoms:  NA    Mental Status Exam Appearance and self-care  Stature:  Average   Weight:  Thin   Clothing:  Casual (In scrubs.)   Grooming:  Normal   Cosmetic use:  None   Posture/gait:  Normal   Motor activity:  Not Remarkable   Sensorium  Attention:  Normal   Concentration:  Normal   Orientation:  X5   Recall/memory:  Normal   Affect and Mood  Affect:  Flat   Mood:  Irritable   Relating  Eye contact:  Normal   Facial expression:  Depressed; Responsive   Attitude toward examiner:  Cooperative   Thought and Language  Speech flow: Normal   Thought content:  Appropriate to Mood and Circumstances   Preoccupation:  None   Hallucinations:  None   Organization:  Coherent   [employer] of Knowledge:  Average   Intelligence:  Average   Abstraction:  Normal   Judgement:  Poor   Reality Testing:  Adequate   Insight:  Gaps   Decision Making:  Impulsive; Vacilates   Social Functioning  Social Maturity:  Isolates; Impulsive; Irresponsible   Social Judgement:  Heedless; Chief of Staff   Stress  Stressors:  Other (Comment) (ongoing SA use - homelessness)   Coping Ability:  Deficient supports; Overwhelmed   Skill Deficits:  Self-control; Decision making   Supports:  Support needed     Religion: Religion/Spirituality Are You A Religious Person?: No How Might This Affect Treatment?: NA  Leisure/Recreation: Leisure / Recreation Do You Have Hobbies?: No Leisure and Hobbies: N/A  Exercise/Diet: Exercise/Diet Do You Exercise?: No (UTA) Have You Gained or Lost A Significant Amount of Weight in the Past Six Months?: No Do You Follow a Special Diet?: No Do You Have Any Trouble Sleeping?: No   CCA Employment/Education Employment/Work Situation: Employment / Work Situation Employment Situation: Unemployed Patient's Job has Been Impacted by  Current Illness: No Has Patient ever Been in Equities trader?: No  Education: Education Is Patient Currently Attending School?: No Last Grade Completed: 12 Did You Product manager?: No Did You Have An Individualized Education Program (IIEP): No Did You Have Any Difficulty At School?: No Patient's Education Has Been Impacted by Current Illness: No   CCA Family/Childhood History Family and Relationship  History: Family history Marital status: Single Does patient have children?: No  Childhood History:  Childhood History By whom was/is the patient raised?: Mother Did patient suffer any verbal/emotional/physical/sexual abuse as a child?: No Did patient suffer from severe childhood neglect?: No Has patient ever been sexually abused/assaulted/raped as an adolescent or adult?: No Was the patient ever a victim of a crime or a disaster?: No Witnessed domestic violence?: No Has patient been affected by domestic violence as an adult?: No       CCA Substance Use Alcohol/Drug Use: Alcohol / Drug Use Pain Medications: See MAR Prescriptions: See MAR Over the Counter: See MAR History of alcohol / drug use?: Yes Longest period of sobriety (when/how long): UTA Negative Consequences of Use: Legal, Personal relationships, Work / Programmer, multimedia, Surveyor, quantity Withdrawal Symptoms: None Substance #1 Name of Substance 1: Methamphetamines 1 - Age of First Use: 33 1 - Amount (size/oz): 1 gram 1 - Frequency: daily 1 - Duration: unknown - 10 yrs total use, only clean time is when admitted to inpatient [facility] programs. 1 - Last Use / Amount: today/2-3 points (tenths) 1 - Method of Aquiring: buys/ and people owe me 1- Route of Use: IV injects                       ASAM's:  Six Dimensions of Multidimensional Assessment  Dimension 1:  Acute Intoxication and/or Withdrawal Potential:   Dimension 1:  Description of individual's past and current experiences of substance use and withdrawal: None.  Dimension  2:  Biomedical Conditions and Complications:   Dimension 2:  Description of patient's biomedical conditions and  complications: None.  Dimension 3:  Emotional, Behavioral, or Cognitive Conditions and Complications:  Dimension 3:  Description of emotional, behavioral, or cognitive conditions and complications: Underlying Bipolar - struggles to stay on medications consistently d/t poor follow through with outpt services.  Dimension 4:  Readiness to Change:  Dimension 4:  Description of Readiness to Change criteria: Pt mentioned he is ready to stop using  Dimension 5:  Relapse, Continued use, or Continued Problem Potential:  Dimension 5:  Relapse, continued use, or continued problem potential critiera description: Pt has ongoing substance use.  Dimension 6:  Recovery/Living Environment:  Dimension 6:  Recovery/Iiving environment criteria description: Low support outside of other users  ASAM Severity Score: ASAM's Severity Rating Score: 7  ASAM Recommended Level of Treatment: ASAM Recommended Level of Treatment: Level II Intensive Outpatient Treatment   Substance use Disorder (SUD) Substance Use Disorder (SUD)  Checklist Symptoms of Substance Use: Continued use despite having a persistent/recurrent physical/psychological problem caused/exacerbated by use, Evidence of tolerance, Continued use despite persistent or recurrent social, interpersonal problems, caused or exacerbated by use, Recurrent use that results in a failure to fulfill major role obligations (work, school, home)  Recommendations for Services/Supports/Treatments: Recommendations for Services/Supports/Treatments Recommendations For Services/Supports/Treatments: Insurance claims handler, CD-IOP Intensive Chemical Dependency Program, Medication Management, Individual Therapy  Disposition Recommendation per psychiatric provider: There are no psychiatric contraindications to discharge at this time   DSM5 Diagnoses: Patient Active Problem List    Diagnosis Date Noted   Homelessness unspecified 06/16/2024   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   Substance induced mood disorder (HCC) 06/13/2024   Suicidal ideations 06/13/2024   Psychosis (HCC) 02/24/2024   Malingering 02/24/2024   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, 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/06/2024   Bipolar 1 disorder, depressed (HCC) 01/30/2024   Methamphetamine use disorder, severe, dependence (HCC) 10/21/2023  History of intravenous drug abuse 10/19/2023   Osteomyelitis of left arm (HCC) 10/19/2023   Septic olecranon bursitis of right elbow 10/19/2023   Hypokalemia 10/14/2023   Normocytic anemia 10/14/2023   Polysubstance abuse (HCC) 10/14/2023   Cellulitis of right elbow 10/13/2023   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     Referrals to Alternative Service(s): Referred to Alternative Service(s):   Place:   Date:   Time:    Referred to Alternative Service(s):   Place:   Date:   Time:    Referred to Alternative Service(s):   Place:   Date:   Time:    Referred to Alternative Service(s):   Place:   Date:   Time:     Deland LITTIE Louder, [facility]

## 2024-07-09 NOTE — Discharge Summary (Signed)
 Trevor [name] to be discharged Home per NP order. An After Visit Summary was printed and given to the patient. Patient escorted out and discharged home via private auto.  [name]  07/09/2024 5:20 PM

## 2024-07-09 NOTE — Progress Notes (Signed)
   07/09/24 1530  [facility] Triage Screening (Walk-ins at [facility] only)  How Did You Hear About Us ? Self  What Is the Reason for Your Visit/Call Today? Patient is a 43 y.o. male with a hx of Bipolar and Stimlant Use Disroder (meth type) who presents voluntarily requesting a refill of his medications, stating he has never been followed by an outpatient provider, stating he struggles to follow up.  Patient reports he has been struggling with a meth addiction for 10 years.  He reports he uses appoximately a gram IV daily with last use of 2-3 points (2/10 or 3/10 of gram) today.  He states he knows he needs to detox and stay clean, however does not believe he would be considered for admission here.  He states the last time he was here, he was turned away when they didn't have beds.  He states he got angry and cursed at the security, same security working today.  Patient states he apologized to the security officer after the incident last time he was here.  Patient is open to [facility] admission, if considered.  He is also seeking a refill of medications, stating he only got 3 days worth the last time he was seen here and he ran out a week ago.  Patient has yet to follow up and is often admitted to inpatient programs to stabalize and get Rx refills on d/c.  He states he would follow up with outpatient psychiatry, if referred.  How Long Has This Been Causing You Problems? > than 6 months  Have You Recently Had Any Thoughts About Hurting Yourself? No  Are You Planning to Commit Suicide/Harm Yourself At This time? No  Have you Recently Had Thoughts About Hurting Someone [name]? No  How long ago did you have thoughts of harming others? N/A  Are You Planning To Harm Someone At This Time? No  Physical Abuse Denies  Verbal Abuse Denies  Sexual Abuse Denies  Exploitation of patient/patient's resources Denies  Self-Neglect Denies  Possible abuse reported to:  (N/A)  Are you currently experiencing any auditory, visual or other  hallucinations? No  Please explain the hallucinations you are currently experiencing: N/A  Have You Used Any Alcohol or Drugs in the Past 24 Hours? Yes  What Did You Use and How Much? 2-3 points of meth earlier today  Do you have any current medical co-morbidities that require immediate attention? No  Clinician description of patient physical appearance/behavior: Patient is calm, cooperative and pleasant today, apologizing for behavior during last admission - AAOx5  What Do You Feel Would Help You the Most Today? Alcohol or Drug Use Treatment;Treatment for Depression or other mood problem;Medication(s)  If access to [facility] Urgent Care was not available, would you have sought care in the Emergency Department? No  Determination of Need Urgent (48 hours)  Options For Referral Chemical Dependency Intensive Outpatient Therapy (CDIOP);Inpatient Hospitalization;BH Urgent Care;Facility-Based Crisis  Determination of Need filed? Yes

## 2024-07-09 NOTE — ED Provider Notes (Signed)
 Behavioral Health Urgent Care Medical Screening Exam  Patient Name: Trevor [name] MRN: [id number] Date of Evaluation: 07/09/24 Chief Complaint:  I just need my meds Diagnosis:  Final diagnoses:  Encounter for medication management   History of Present illness: Trevor [name] 43 y.o., male patient presented to [facility] as a voluntary walk in unaccompanied with complaints of I just need my meds. He reports he was seeking a 3-day prescription for his medications. He stated that during his last hospitalization here from 9/24 to 9/27, he was given a 3-day prescription but did not follow through with the discharge plan, as he was not ready at the time, but that he is ready now, that is head is clearer.   He reports being prescribed Zyprexa , Zoloft , and Prozac .  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 The provider was only able to get a few words in. The patient was irritable, aggressive, and agitated, and did not want to listen to the provider. He denies suicidal ideation but reports homicidal ideation toward the security guard.  Patient has had several ED visits and hospitalizations. He was recently discharged from [facility] on 9/27, then presented to the ED on 10/8 with a similar request for a prescription for his medications. Prior visits include: 9/24 (ED for hallucinations and SI), 8/15 (SI), 6/30 (SI at an outside hospital), 6/26, 6/23, 6/06, 5/27, 5/11, and 4/25. Patient currently has a prescription at this pharmacy and was advised to pick it up upon discharge today.  Pt denies having a psychiatrist or therapist because he is homeless.   Trevor [name], is seen face to face by this provider, consulted with Dr. Cole; and chart reviewed on 07/09/24.  On evaluation Trevor [name] reports he is trying to obtain his medication and is seeking prescriptions. The provider was  unable to complete a full assessment, as the patient cursed throughout the session and vented about one of the security staff. However, he denies SI. The patient was difficult to redirect. He was irritable throughout the assessment, frequently using profanity, and was not ready to listen or engage positively.  He was advised to come early to the urgent care/upstairs Monday through Friday for medication management.  During evaluation Trevor [name] is sitting in an upright position in no acute distress. He is alert & oriented x 4, agitated, restless during this assessment. His mood is anxious and irritable, with a congruent affect. He has normal speech. Objectively, there is no evidence of psychosis, or delusional thinking. Patient does not appear to be responding to internal or external stimuli. He is able to converse coherently, with goal-directed thoughts, no distractibility, and no preoccupation. He denies suicidal ideation but endorses homicidal ideation toward one of the security employees. Psychosis and paranoia could not be fully assessed. Patient could not be redirected.  Flowsheet Row ED from 06/27/2024 in [facility] Most recent reading at 06/27/2024  9:14 AM ED from 06/27/2024 in [facility] Emergency Department at [facility] Most recent reading at 06/27/2024  7:39 AM ED from 06/13/2024 in [facility] Most recent reading at 06/13/2024 11:18 PM  C-SSRS RISK CATEGORY High Risk No Risk Low Risk    Psychiatric Specialty Exam  Presentation  General Appearance:Fairly Groomed  Eye Contact:Good  Speech:Clear and Coherent  Speech Volume:Normal  Handedness:Right   Mood and Affect  Mood: Irritable; Labile; Angry  Affect: Appropriate   Thought Process  Thought Processes: Coherent  Descriptions of Associations:Intact  Orientation:Full (Time, Place and Person)  Thought Content:WDL  Diagnosis of Schizophrenia  or Schizoaffective disorder in past: No   Hallucinations:None Pt reports hearing mumblimg voices.  Ideas of Reference:None  Suicidal Thoughts:No Without Intent; Without Plan Without Plan  Homicidal Thoughts:No   Sensorium  Memory: Immediate Fair  Judgment: Poor  Insight: Poor   Executive Functions  Concentration: Fair  Attention Span: Fair  Recall: Trevor [name] of Knowledge: Fair  Language: Fair   Psychomotor Activity  Psychomotor Activity: Normal   Assets  Assets: Manufacturing systems engineer; Desire for Improvement   Sleep  Sleep: -- (Unable to assess, too agitated.)  Number of hours:  5   Physical Exam: Physical Exam Vitals reviewed.  HENT:     Head: Normocephalic and atraumatic.     Nose: Nose normal.     Mouth/Throat:     Pharynx: Oropharynx is clear.  Cardiovascular:     Rate and Rhythm: Normal rate.  Pulmonary:     Effort: Pulmonary effort is normal.  Musculoskeletal:        General: Normal range of motion.     Cervical back: Normal range of motion.  Neurological:     Mental Status: He is alert and oriented to person, place, and time.  Psychiatric:        Attention and Perception: He is inattentive.        Mood and Affect: Affect is labile and angry.        Speech: Speech normal.        Behavior: Behavior is agitated and aggressive.        Thought Content: Thought content includes homicidal ideation.        Cognition and Memory: Cognition normal.        Judgment: Judgment normal.    Review of Systems  Psychiatric/Behavioral:  Positive for substance abuse.   All other systems reviewed and are negative.  Blood pressure (!) 135/93, pulse 61, temperature 97.8 F (36.6 C), temperature source Oral, resp. rate 16, SpO2 99%. There is no height or weight on file to calculate BMI.  Musculoskeletal: Strength & Muscle Tone: within normal limits Gait & Station: normal Patient leans: N/A   [facility] MSE Discharge Disposition for Follow up  and Recommendations: Based on my evaluation the patient does not appear to have an emergency medical condition and can be discharged with resources and follow up care in outpatient services for Medication Management and Substance Abuse Intensive Outpatient Program.   You already have your prescription at this pharmacy, [address]. Please go there and pick up your medications.   Also, come to the outpatient clinic upstairs at 6:50 AM to see a provider for your medication management.  Get help right away if: You have thoughts about hurting yourself or others. Get help right away if you feel like you may hurt yourself or others, or have thoughts about taking your own life. Go to your nearest emergency room or: Call 911. Call the National Suicide Prevention Lifeline at 4371463112 or 988 in the [place] This is open 24 hours a day. If you're a Veteran: Call 988 and press 1. This is open 24 hours a day. Text the [employer] at 716-532-4856. Summary Mental health is not just the absence of mental illness. It involves understanding your emotions and behaviors, and taking steps to  manage them in a healthy way. If you have symptoms of mental or emotional distress, get help from family, friends, a health care provider, or a mental health professional. Practice good mental health behaviors such as stress management skills, self-calming skills, exercise, healthy sleeping and eating, and supportive relationships. This information is not intended to replace advice given to you by your health care provider. Make sure you discuss any questions you have with your health care provider.  Education provided on the fact that if experiencing worsening of psychiatry symptoms including suicidal ideations, homicidal ideations, or having auditory/visual hallucinations, etc, to call 911, 988, come back to this location, or go to the nearest ER. Pt verbalized understanding.    Tosin Roxie Kreeger, NP 07/09/2024, 5:59 PM

## 2024-07-09 NOTE — Discharge Instructions (Addendum)
 You already have your prescription at this pharmacy, [address] AT [address]. Please go there and pick up your medications   Also, come to the outpatient clinic upstairs at 6:50 AM to see a provider for your medication management.  Get help right away if: You have thoughts about hurting yourself or others. Get help right away if you feel like you may hurt yourself or others, or have thoughts about taking your own life. Go to your nearest emergency room or: Call 911. Call the National Suicide Prevention Lifeline at 985-226-7273 or 988 in the [place] This is open 24 hours a day. If you're a Veteran: Call 988 and press 1. This is open 24 hours a day. Text the [employer] at (320)088-6449. Summary Mental health is not just the absence of mental illness. It involves understanding your emotions and behaviors, and taking steps to manage them in a healthy way. If you have symptoms of mental or emotional distress, get help from family, friends, a health care provider, or a mental health professional. Practice good mental health behaviors such as stress management skills, self-calming skills, exercise, healthy sleeping and eating, and supportive relationships. This information is not intended to replace advice given to you by your health care provider. Make sure you discuss any questions you have with your health care provider.  Education provided on the fact that if experiencing worsening of psychiatry symptoms including suicidal ideations, homicidal ideations, or having auditory/visual hallucinations, etc, to call 911, 988, come back to this location, or go to the nearest ER. Pt verbalized understanding.

## 2024-07-27 ENCOUNTER — Emergency Department (HOSPITAL_COMMUNITY)
Admission: EM | Admit: 2024-07-27 | Discharge: 2024-07-27 | Disposition: A | Discharge status: Home / Self Care | Payer: MEDICAID | Attending: Emergency Medicine | Admitting: Emergency Medicine

## 2024-07-27 DIAGNOSIS — Z5900 Homelessness unspecified: Secondary | ICD-10-CM | POA: Insufficient documentation

## 2024-07-27 DIAGNOSIS — R000 Tachycardia, unspecified: Secondary | ICD-10-CM | POA: Insufficient documentation

## 2024-07-27 DIAGNOSIS — Z760 Encounter for issue of repeat prescription: Secondary | ICD-10-CM | POA: Insufficient documentation

## 2024-07-27 DIAGNOSIS — I10 Essential (primary) hypertension: Secondary | ICD-10-CM | POA: Diagnosis not present

## 2024-07-27 MED ORDER — OLANZAPINE 5 MG PO TABS
5.0000 mg | ORAL_TABLET | Freq: Two times a day (BID) | ORAL | 0 refills | Status: AC
Start: 2024-07-27 — End: ?

## 2024-07-27 MED ORDER — FLUOXETINE HCL 20 MG PO CAPS: 20.0000 mg | ORAL_CAPSULE | Freq: Every day | ORAL | 0 refills | Status: AC

## 2024-07-27 MED ORDER — LORAZEPAM 1 MG PO TABS
1.0000 mg | ORAL_TABLET | Freq: Once | ORAL | Status: AC
  Administered 2024-07-27: 1 mg via ORAL
  Filled 2024-07-27: qty 1

## 2024-07-27 NOTE — Discharge Instructions (Addendum)
 Please take the medications that we have prescribed, follow-up with your psychiatric provider for further psych med refills.

## 2024-07-27 NOTE — ED Triage Notes (Signed)
 Requesting medication refills of Ativan , Zyprexa , Prozac . No other concerns.

## 2024-07-27 NOTE — ED Provider Notes (Signed)
 [place] EMERGENCY DEPARTMENT AT [facility] Provider Note   CSN: [id number] Arrival date & time: 07/27/24  9190     Patient presents with: Medication Refill   Trevor [name] is a 43 y.o. male with past medical history significant for polysubstance abuse, bipolar disorder, malingering, meth dependence, homelessness who presents with concern for medication refills.  Patient reports that he is taking Zyprexa , Prozac , Ativan .  Discussed with patient that he does not have a recent or documented prescription for Ativan , it does not seem that he has had any kind of benzodiazepine since May when he had a small as needed prescription for Xanax .  Patient reports that he is fine with just getting the other 2 medications.    Medication Refill      Prior to Admission medications   Medication Sig Start Date End Date Taking? Authorizing Provider  FLUoxetine  (PROZAC ) 20 MG capsule Take 1 capsule (20 mg total) by mouth daily. 07/27/24   Taylan Mayhan H, PA-C  OLANZapine  (ZYPREXA ) 5 MG tablet Take 1 tablet (5 mg total) by mouth 2 (two) times daily. 07/27/24   Khianna Blazina H, PA-C    Allergies: Penicillins and Augmentin [amoxicillin-pot clavulanate]    Review of Systems  All other systems reviewed and are negative.   Updated Vital Signs BP (!) 166/144 (BP Location: Right Arm)   Pulse (!) 121   Temp (!) 97.5 F (36.4 C) (Oral)   Resp 18   SpO2 100%   Physical Exam Vitals and nursing note reviewed.  Constitutional:      General: He is not in acute distress.    Appearance: Normal appearance.  HENT:     Head: Normocephalic and atraumatic.  Eyes:     General:        Right eye: No discharge.        Left eye: No discharge.  Cardiovascular:     Rate and Rhythm: Regular rhythm. Tachycardia present.  Pulmonary:     Effort: Pulmonary effort is normal. No respiratory distress.  Musculoskeletal:        General: No deformity.  Skin:    General: Skin is warm and  dry.  Neurological:     Mental Status: He is alert and oriented to person, place, and time.  Psychiatric:        Mood and Affect: Mood normal.        Behavior: Behavior normal.     (all labs ordered are listed, but only abnormal results are displayed) Labs Reviewed - No data to display  EKG: None  Radiology: No results found.   Procedures   Medications Ordered in the ED  LORazepam  (ATIVAN ) tablet 1 mg (1 mg Oral Given 07/27/24 0841)                                    Medical Decision Making  This patient is a 43 y.o. male who presents to the ED for concern of medication refill needs.   Differential diagnoses prior to evaluation: Need for medication refills  Past Medical History / Social History / Additional history: Chart reviewed. Pertinent results include: polysubstance abuse, bipolar disorder, malingering, meth dependence, homelessness  Physical Exam: Physical exam performed. The pertinent findings include: Patient somewhat tachycardic on arrival, pulse 121, hypertensive, blood pressure 166/144 although suspect that this may be spurious with very narrow pulse pressure, however patient declined repeat vitals  Medications / Treatment:  One-time dose of Ativan  provided as patient does seem quite anxious, but discussed I do not feel comfortable providing a prescription for Ativan  for patient given no previously documented prescription on file.  Will refill his Zyprexa  and Prozac  however   Disposition: After consideration of the diagnostic results and the patients response to treatment, I feel that patient is stable for discharge with plan as above.   emergency department workup does not suggest an emergent condition requiring admission or immediate intervention beyond what has been performed at this time. The plan is: as above. The patient is safe for discharge and has been instructed to return immediately for worsening symptoms, change in symptoms or any other  concerns.   Final diagnoses:  Medication refill    ED Discharge Orders          Ordered    FLUoxetine  (PROZAC ) 20 MG capsule  Daily        07/27/24 0835    OLANZapine  (ZYPREXA ) 5 MG tablet  2 times daily        07/27/24 0835               Naleah Kofoed, Sherlean DEL, PA-C 07/27/24 0847    Garrick Charleston, MD 07/27/24 1621

## 2024-07-28 ENCOUNTER — Other Ambulatory Visit: Payer: Self-pay

## 2024-07-28 ENCOUNTER — Encounter (HOSPITAL_COMMUNITY): Payer: Self-pay

## 2024-07-28 ENCOUNTER — Emergency Department (HOSPITAL_COMMUNITY)
Admission: EM | Admit: 2024-07-28 | Discharge: 2024-07-29 | Disposition: A | Discharge status: Other Acute Inpatient Hospital | Payer: MEDICAID | Attending: Emergency Medicine | Admitting: Emergency Medicine

## 2024-07-28 DIAGNOSIS — F1520 Other stimulant dependence, uncomplicated: Secondary | ICD-10-CM | POA: Insufficient documentation

## 2024-07-28 DIAGNOSIS — F39 Unspecified mood [affective] disorder: Secondary | ICD-10-CM

## 2024-07-28 DIAGNOSIS — R45850 Homicidal ideations: Secondary | ICD-10-CM | POA: Diagnosis not present

## 2024-07-28 DIAGNOSIS — R45851 Suicidal ideations: Secondary | ICD-10-CM | POA: Diagnosis not present

## 2024-07-28 NOTE — ED Notes (Signed)
 Sent a gold top down as well. Pt is dressed out and has two belonging bags that are labeled. Security has wanded the patient.

## 2024-07-28 NOTE — ED Provider Notes (Signed)
 [place] EMERGENCY DEPARTMENT AT [facility] Provider Note   CSN: [id number] Arrival date & time: 07/28/24  7676     History Chief Complaint  Patient presents with   Suicidal    HPI Trevor [name] is a 43 y.o. male presenting for suicidal and homicidal. Endorses AH/VH HX of similar. Admitted 2x this year. Patient's recorded medical, surgical, social, medication list and allergies were reviewed in the Snapshot window as part of the initial history.   Review of Systems   Review of Systems  Constitutional:  Negative for chills and fever.  HENT:  Negative for ear pain and sore throat.   Eyes:  Negative for pain and visual disturbance.  Respiratory:  Negative for cough and shortness of breath.   Cardiovascular:  Negative for chest pain and palpitations.  Gastrointestinal:  Negative for abdominal pain and vomiting.  Genitourinary:  Negative for dysuria and hematuria.  Musculoskeletal:  Negative for arthralgias and back pain.  Skin:  Negative for color change and rash.  Neurological:  Negative for seizures and syncope.  All other systems reviewed and are negative.   Physical Exam Updated Vital Signs BP (!) 153/97   Pulse 90   Temp 98.4 F (36.9 C) (Oral)   Resp 18   Ht 5' 11 (1.803 m)   Wt 74.8 kg   SpO2 98%   BMI 23.01 kg/m  Physical Exam Vitals and nursing note reviewed.  Constitutional:      General: He is not in acute distress.    Appearance: He is well-developed.  HENT:     Head: Normocephalic and atraumatic.  Eyes:     Conjunctiva/sclera: Conjunctivae normal.  Cardiovascular:     Rate and Rhythm: Normal rate and regular rhythm.  Pulmonary:     Effort: Pulmonary effort is normal. No respiratory distress.  Abdominal:     General: Abdomen is flat. There is no distension.  Musculoskeletal:        General: No swelling or deformity.  Skin:    General: Skin is warm and dry.     Capillary Refill: Capillary refill takes less than 2 seconds.   Neurological:     Mental Status: He is alert and oriented to person, place, and time. Mental status is at baseline.      ED Course/ Medical Decision Making/ A&P    Procedures Procedures   Medications Ordered in ED Medications - No data to display Medical Decision Making:   [name] is a 43 y.o. male who presented to the ED today for psychiatric evaluation.  Patient is endorsing SI/HI.  Patient does have a history of similar for which they are on .  They are compliant with their medications.  On my initial exam, the pt was linear in thought, appropriate in affect, and overall well-appearing.  Vital signs reviewed and reassuring.  Reviewed and confirmed nursing documentation for past medical history, family history, social history.   Patient placed on continuous vitals and telemetry monitoring while in ED which was reviewed periodically.     Initial Assessment:   This is most consistent with an acute life threatening illness. With the patient's presentation of SI/HI, patient warrants emergent psychiatric consultation.  Differential includes primary psychosis, substance-induced psychosis, mood disturbance.  Initial Plan:  Patient immediately placed into ED psychiatric hold protocol including suicide precautions, elopement precautions and vital sign monitoring.    Emergent behavioral health hold signed and notarized while awaiting psychiatric consultation due to threat to self or others.  Psychiatry consulted for further evaluation once patient medically cleared.  Medical screening evaluation ordered and reviewed with no obvious medical reason to postpone psychiatric evaluation.   Patient is voluntary at this time.  No IVC.  May need to be reassessed if capacity is changing.  Final Assessment and Plan:   Patient is medically cleared for psychiatric disposition.  Patient is currently pending psychiatric evaluation and was placed into the departmental psychiatric hold  protocol pending their recommendations.  Patient will be observed per protocol and evaluated daily/as needed by emergency physician team pending ultimate psychiatric recommendations for disposition.  Clinical Impression:  1. Suicidal ideation      Data Unavailable   Final Clinical Impression(s) / ED Diagnoses Final diagnoses:  Suicidal ideation    Rx / DC Orders ED Discharge Orders     None         Jerral Meth, MD 07/29/24 [phone]

## 2024-07-28 NOTE — ED Provider Notes (Incomplete)
  EMERGENCY DEPARTMENT AT [facility] Provider Note   CSN: [id number] Arrival date & time: 07/28/24  2323     History Chief Complaint  Patient presents with  . Suicidal    HPI Trevor [name] is a 43 y.o. male presenting for suicidal and homicidal. Endorses AH/VH HX of similar. Admitted 2x this year. Patient's recorded medical, surgical, social, medication list and allergies were reviewed in the Snapshot window as part of the initial history.   Review of Systems   Review of Systems  Constitutional:  Negative for chills and fever.  HENT:  Negative for ear pain and sore throat.   Eyes:  Negative for pain and visual disturbance.  Respiratory:  Negative for cough and shortness of breath.   Cardiovascular:  Negative for chest pain and palpitations.  Gastrointestinal:  Negative for abdominal pain and vomiting.  Genitourinary:  Negative for dysuria and hematuria.  Musculoskeletal:  Negative for arthralgias and back pain.  Skin:  Negative for color change and rash.  Neurological:  Negative for seizures and syncope.  All other systems reviewed and are negative.   Physical Exam Updated Vital Signs BP (!) 153/97   Pulse 90   Temp 98.4 F (36.9 C) (Oral)   Resp 18   Ht 5' 11 (1.803 m)   Wt 74.8 kg   SpO2 98%   BMI 23.01 kg/m  Physical Exam Vitals and nursing note reviewed.  Constitutional:      General: He is not in acute distress.    Appearance: He is well-developed.  HENT:     Head: Normocephalic and atraumatic.  Eyes:     Conjunctiva/sclera: Conjunctivae normal.  Cardiovascular:     Rate and Rhythm: Normal rate and regular rhythm.  Pulmonary:     Effort: Pulmonary effort is normal. No respiratory distress.  Abdominal:     General: Abdomen is flat. There is no distension.  Musculoskeletal:        General: No swelling or deformity.  Skin:    General: Skin is warm and dry.     Capillary Refill: Capillary refill takes less than 2 seconds.   Neurological:     Mental Status: He is alert and oriented to person, place, and time. Mental status is at baseline.      ED Course/ Medical Decision Making/ A&P    Procedures Procedures   Medications Ordered in ED Medications - No data to display Medical Decision Making:   Trevor [name] is a 43 y.o. male who presented to the ED today for psychiatric evaluation.  Patient is endorsing SI/HI.  Patient does have a history of similar for which they are on ***.  They are***compliant with their medications.  On my initial exam, the pt was linear in thought, appropriate in affect, and overall well-appearing.  Vital signs reviewed and reassuring.  Reviewed and confirmed nursing documentation for past medical history, family history, social history.   Patient placed on continuous vitals and telemetry monitoring while in ED which was reviewed periodically.     Initial Assessment:   This is most consistent with an acute life threatening illness. With the patient's presentation of SI/HI, patient warrants emergent psychiatric consultation.  Differential includes primary psychosis, substance-induced psychosis, mood disturbance.  Initial Plan:  Patient immediately placed into ED psychiatric hold protocol including suicide precautions, elopement precautions and vital sign monitoring.    Emergent behavioral health hold signed and notarized while awaiting psychiatric consultation due to threat to self or others.  Psychiatry consulted for further evaluation once patient medically cleared.  Medical screening evaluation ordered and reviewed with no obvious medical reason to postpone psychiatric evaluation.   Patient is voluntary at this time.  No IVC.  May need to be reassessed if capacity is changing.  Final Assessment and Plan:   ***Patient is medically cleared for psychiatric disposition.  ***Patient is currently pending psychiatric evaluation and was placed into the departmental  psychiatric hold protocol pending their recommendations.  Patient will be observed per protocol and evaluated daily/as needed by emergency physician team pending ultimate psychiatric recommendations for disposition.  Clinical Impression: No diagnosis found.   Data Unavailable   Final Clinical Impression(s) / ED Diagnoses Final diagnoses:  None    Rx / DC Orders ED Discharge Orders     None

## 2024-07-28 NOTE — ED Notes (Signed)
 Belongs are in cabinet labeled 16-18 Res A

## 2024-07-28 NOTE — ED Triage Notes (Signed)
 Pt arrived by foot stating he has suicidal and homicidal thoughts.  He says he has many ways he could do it

## 2024-07-29 DIAGNOSIS — F39 Unspecified mood [affective] disorder: Secondary | ICD-10-CM

## 2024-07-29 DIAGNOSIS — F1520 Other stimulant dependence, uncomplicated: Secondary | ICD-10-CM

## 2024-07-29 LAB — COMPREHENSIVE METABOLIC PANEL WITH GFR
ALT: 28 U/L (ref 0–44)
AST: 43 U/L — ABNORMAL HIGH (ref 15–41)
Albumin: 4.5 g/dL (ref 3.5–5.0)
Alkaline Phosphatase: 65 U/L (ref 38–126)
Anion gap: 11 (ref 5–15)
BUN: 22 mg/dL — ABNORMAL HIGH (ref 6–20)
CO2: 25 mmol/L (ref 22–32)
Calcium: 9.3 mg/dL (ref 8.9–10.3)
Chloride: 104 mmol/L (ref 98–111)
Creatinine, Ser: 0.96 mg/dL (ref 0.61–1.24)
GFR, Estimated: >60 mL/min (ref >60)
Glucose, Bld: 102 mg/dL — ABNORMAL HIGH (ref 70–99)
Potassium: 3.8 mmol/L (ref 3.5–5.1)
Sodium: 139 mmol/L (ref 135–145)
Total Bilirubin: 0.6 mg/dL (ref 0.0–1.2)
Total Protein: 7.5 g/dL (ref 6.5–8.1)

## 2024-07-29 LAB — CBC
HCT: 41.6 % (ref 39.0–52.0)
Hemoglobin: 12.8 g/dL — ABNORMAL LOW (ref 13.0–17.0)
MCH: 27.9 pg (ref 26.0–34.0)
MCHC: 30.8 g/dL (ref 30.0–36.0)
MCV: 90.6 fL (ref 80.0–100.0)
Platelets: 272 K/uL (ref 150–400)
RBC: 4.59 MIL/uL (ref 4.22–5.81)
RDW: 14 % (ref 11.5–15.5)
WBC: 10.1 K/uL (ref 4.0–10.5)
nRBC: 0 % (ref 0.0–0.2)

## 2024-07-29 LAB — URINE DRUG SCREEN
Amphetamines: POSITIVE — AB
Barbiturates: NEGATIVE
Benzodiazepines: POSITIVE — AB
Cocaine: NEGATIVE
Fentanyl: NEGATIVE
Methadone Scn, Ur: NEGATIVE
Opiates: NEGATIVE
Tetrahydrocannabinol: POSITIVE — AB

## 2024-07-29 LAB — ETHANOL: Alcohol, Ethyl (B): <15 mg/dL (ref <15)

## 2024-07-29 MED ORDER — OLANZAPINE 5 MG PO TABS: 5.0000 mg | ORAL_TABLET | Freq: Every day | ORAL | Status: DC

## 2024-07-29 MED ORDER — OLANZAPINE 5 MG PO TABS
5.0000 mg | ORAL_TABLET | ORAL | Status: DC
  Filled 2024-07-29: qty 1

## 2024-07-29 MED ORDER — ZIPRASIDONE MESYLATE 20 MG IM SOLR
10.0000 mg | Freq: Four times a day (QID) | INTRAMUSCULAR | Status: DC | PRN
Start: 2024-07-29 — End: 2024-07-29

## 2024-07-29 MED ORDER — HYDROXYZINE HCL 25 MG PO TABS: 50.0000 mg | ORAL_TABLET | Freq: Four times a day (QID) | ORAL | Status: DC | PRN

## 2024-07-29 MED ORDER — DIAZEPAM 5 MG/ML IJ SOLN: 10.0000 mg | Freq: Four times a day (QID) | INTRAMUSCULAR | Status: DC | PRN

## 2024-07-29 MED ORDER — RISPERIDONE 0.5 MG PO TBDP
2.0000 mg | ORAL_TABLET | Freq: Once | ORAL | Status: AC
  Administered 2024-07-29: 2 mg via ORAL
  Filled 2024-07-29: qty 4

## 2024-07-29 MED ORDER — FLUOXETINE HCL 20 MG PO CAPS
20.0000 mg | ORAL_CAPSULE | Freq: Every day | ORAL | Status: DC
  Administered 2024-07-29: 20 mg via ORAL
  Filled 2024-07-29: qty 1

## 2024-07-29 MED ORDER — LORAZEPAM 1 MG PO TABS
2.0000 mg | ORAL_TABLET | Freq: Once | ORAL | Status: AC
  Administered 2024-07-29: 2 mg via ORAL
  Filled 2024-07-29: qty 2

## 2024-07-29 NOTE — BH Assessment (Signed)
 Clinician spoke to Faith with [facility] to complete pt's TTS assessment. Clinician provided pt's name, MRN, location, age, room number and provider's name Secure message completed.    [facility] coordinator to update secure chat when assessment time and provider are assigned.   Redmond Pulling, MS, [facility], [facility] Triage Specialist [phone]

## 2024-07-29 NOTE — Progress Notes (Signed)
 Patient has been denied by [facility] due to no appropriate beds available. Patient meets BH inpatient criteria per Adriana Pontes, MD. Patient has been faxed out to the following facilities:   [facility]  [address] [place] [address] [phone]  [facility]  [address] [place] [address] [phone]  [facility]  [address], [place] [address] [phone]  [facility]  [address] [place], [place] [address] [phone]  [facility] Patient Placement  [facility] [place] [phone]  [facility]  [address] [place] [address] [phone]  [facility]  [address] [place], [place] [phone]  [facility]  [address] [place] [address] [phone]  [facility]  [address] [place] [address] [phone]  [facility]  [address] [place], [place] [address] [phone]  [facility]  [address] [place] [address] [phone]  [facility]  [address] [place] [address] [phone]  [facility]  [address] [address] [phone]  [facility]  [address] [place] [address] [phone]  [facility]  [address], [place] [address] [phone]  [facility]  [address], [place] [address] [phone]  [facility]  [address] [place] [address]  [phone]  [facility]  [address], [place] [address] [phone]   Bunnie Gallop, MSW, LCSW-A  10:31 AM 07/29/2024

## 2024-07-29 NOTE — ED Notes (Signed)
 Writer called report to Chief Executive Officer at [facility]. Writer left voicemail with [facility] [employer] to arrange transportation.

## 2024-07-29 NOTE — ED Provider Notes (Signed)
 Patient has been accepted for inpatient psychiatric care at [facility] Dr. Oneil Charleston accepting.  Patient is voluntary.   Trevor Hobbs, MD 07/29/24 1153
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
## 2024-07-29 NOTE — Progress Notes (Signed)
 [facility] LCSW Progress Note   07/29/2024    11:04 AM  Trevor [name]   [id number]   Type of Contact and Topic:  Psychiatric Bed Placement   Pt accepted to [facility]      Patient meets inpatient criteria per Adriana Pontes, MD    The attending provider will be Dr. Oneil Charleston  Call report to [phone]  Stevens Pereyra, Paramedic @ [facility] ED notified.     Pt scheduled  to arrive at [facility] for TODAY.    Bunnie Gallop, MSW, LCSW-A  11:05 AM 07/29/2024

## 2024-08-29 ENCOUNTER — Ambulatory Visit (HOSPITAL_COMMUNITY)
Admission: EM | Admit: 2024-08-29 | Discharge: 2024-08-29 | Disposition: A | Discharge status: Home / Self Care | Payer: MEDICAID

## 2024-08-29 ENCOUNTER — Telehealth (HOSPITAL_COMMUNITY): Payer: Self-pay | Admitting: Licensed Clinical Social Worker

## 2024-08-29 DIAGNOSIS — R030 Elevated blood-pressure reading, without diagnosis of hypertension: Secondary | ICD-10-CM | POA: Insufficient documentation

## 2024-08-29 DIAGNOSIS — F3160 Bipolar disorder, current episode mixed, unspecified: Secondary | ICD-10-CM | POA: Insufficient documentation

## 2024-08-29 DIAGNOSIS — F411 Generalized anxiety disorder: Secondary | ICD-10-CM | POA: Insufficient documentation

## 2024-08-29 DIAGNOSIS — F1520 Other stimulant dependence, uncomplicated: Secondary | ICD-10-CM | POA: Insufficient documentation
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
## 2024-08-29 NOTE — ED Provider Notes (Signed)
 Behavioral Health Urgent Care Medical Screening Exam  Patient Name: Trevor [name] MRN: [id number] Date of Evaluation: 08/29/24 Chief Complaint:   Diagnosis:  Final diagnoses:  Methamphetamine use disorder, severe, dependence (HCC)  Bipolar disorder, mixed (HCC)    History of Present illness: Trevor [name] is a 43 y.o. male.  Patient arrives to [facility] behavioral health for walk-in assessment.  Patient presents today to schedule follow-up appointment to receive LAI medication.  Trevor [name] states I got a shot (LAI) last month while I was at [facility] and it changed my life.  It was the best thing that happened to me in years.  I am weak, and I have not done anything like fighting in 1 month.  Patient admitted to [facility] approximately 1 month ago related to hallucinations.  Patient unable to recall medication or specific dates.  Patient gives consent for this team to reach out to [facility].  [facility] staff confirm patient received Erzofri 351mg  on 08/07/2024.  Patient will complete ROI to allow [facility] to send after visit summary to [facility] behavioral health outpatient team.  Trevor [name] endorses primary stressor includes daily methamphetamine use.  Using methamphetamine 7 out of 7 days for approximately 1 month.  Previously 1 month sobriety.  Patient also endorses THC use approximately 2 times per week.  Patient has participated in an IOP program at [facility] approximately 7 to 8 years ago.  Reviewed IOP availability at [facility] behavioral health patient positive and would like to complete intake assessment.  Patient states I like to talk about stuff and have people around me who can relate.  Patient was scheduled to follow-up with [facility]ita for outpatient counseling and medication management.  Patient missed his [facility] intake appointment and has been told that he cannot come back for 3 to 4 weeks.  Would like to initiate outpatient follow-up  care at [facility] behavioral health.   Mental health history includes methamphetamine use disorder, suicidal ideation, bipolar 1 disorder, hallucinations, bipolar disorder, mixed, generalized anxiety disorder.  Patient endorses approximately 10 previous inpatient psychiatric hospitalizations.  No family mental health history reported.  Patient previously stable on a combination of medications including fluoxetine  and olanzapine .  Reports stopped these medications 1 month ago.  Patient states I would like to get the shot because I miss my medications or forget to take them a lot.  Trevor [name] denies SI/HI/AVH.  There is no evidence of delusional thought content no indication that patient is responding to internal stimuli.  Chart reviewed and patient reviewed with attending psychiatrist, Dr. Lawrnce.  Upon face-to-face assessment by this provider patient is seated, no apparent distress.  He is alert and oriented, pleasant and cooperative during assessment.  He presents with euthymic mood, congruent affect.  Trevor [name] resides in [place] with a friend.  He denies access to weapons.  He is unemployed, prefers to focus on my mental health for now.   Patient educated and verbalizes understanding of mental health resources and other crisis services in the community. They are instructed to call 911 and present to the nearest emergency room should patient experience any suicidal/homicidal ideation, auditory/visual/hallucinations, or detrimental worsening of mental health condition.    Flowsheet Row ED from 08/29/2024 in [facility] ED from 07/28/2024 in [facility] Emergency Department at [facility] ED from 07/27/2024 in [facility] Emergency Department at [facility]  C-SSRS RISK CATEGORY No Risk High Risk No Risk    Psychiatric Specialty  Exam  Presentation  General Appearance:Appropriate for Environment; Casual; Fairly Groomed  Eye  Contact:Good  Speech:Clear and Coherent; Normal Rate  Speech Volume:Normal  Handedness:Right   Mood and Affect  Mood: Euthymic  Affect: Appropriate; Congruent   Thought Process  Thought Processes: Coherent; Goal Directed; Linear  Descriptions of Associations:Intact  Orientation:Full (Time, Place and Person)  Thought Content:Logical; WDL  Diagnosis of Schizophrenia or Schizoaffective disorder in past: No   Hallucinations:None Pt reports hearing mumblimg voices.  Ideas of Reference:None  Suicidal Thoughts:No Without Intent; Without Plan Without Plan  Homicidal Thoughts:No   Sensorium  Memory: Immediate Good; Recent Fair  Judgment: Fair  Insight: Fair   Executive Functions  Concentration: Good  Attention Span: Good  Recall: Good  Fund of Knowledge: Fair  Language: Fair   Psychomotor Activity  Psychomotor Activity: Normal   Assets  Assets: Communication Skills; Desire for Improvement; Financial Resources/Insurance; Housing; Leisure Time; Resilience; Social Support   Sleep  Sleep: Good  Number of hours:  5   Physical Exam: Physical Exam Vitals and nursing note reviewed.  Constitutional:      Appearance: Normal appearance. He is well-developed.  HENT:     Head: Normocephalic and atraumatic.     Nose: Nose normal.  Cardiovascular:     Rate and Rhythm: Normal rate and regular rhythm.     Heart sounds: Normal heart sounds.  Pulmonary:     Effort: Pulmonary effort is normal.     Breath sounds: Normal breath sounds.  Musculoskeletal:        General: Normal range of motion.     Cervical back: Normal range of motion.  Skin:    General: Skin is warm and dry.  Neurological:     Mental Status: He is alert and oriented to person, place, and time.  Psychiatric:        Attention and Perception: Attention and perception normal.        Mood and Affect: Mood and affect normal.        Speech: Speech normal.        Behavior:  Behavior normal. Behavior is cooperative.        Thought Content: Thought content normal.        Cognition and Memory: Cognition and memory normal.    Review of Systems  Constitutional: Negative.   HENT: Negative.    Eyes: Negative.   Respiratory: Negative.    Cardiovascular: Negative.   Gastrointestinal: Negative.   Genitourinary: Negative.   Musculoskeletal: Negative.   Skin: Negative.   Neurological: Negative.   Psychiatric/Behavioral:  Positive for substance abuse.    Blood pressure (!) 152/95, pulse 98, temperature 98.6 F (37 C), resp. rate 16, SpO2 99%. There is no height or weight on file to calculate BMI.  Musculoskeletal: Strength & Muscle Tone: within normal limits Gait & Station: normal Patient leans: N/A   [facility] MSE Discharge Disposition for Follow up and Recommendations: Based on my evaluation the patient does not appear to have an emergency medical condition and can be discharged with resources and follow up care in outpatient services for Medication Management, Substance Abuse Intensive Outpatient Program, and Individual Therapy Elevated blood pressure.  Follow-up with outpatient primary care, resources provided. Stimulant use disorder.  A referral has been initiated on patient's behalf for chemical dependence-intensive outpatient program at [facility] behavioral health. Bipolar 1 disorder, generalized anxiety disorder.  Follow-up with outpatient psychiatry at [facility] behavioral health.  Walk-in appointment available on 08/30/2024.  Patient aware and will  arrive at [facility] behavioral health at 0700 a.m. for walk-in availability.   ROI completed and faxed to [facility] for medical record requisition.    Ellouise LITTIE Dawn, [name] 08/29/2024, 12:18 PM

## 2024-08-29 NOTE — Discharge Instructions (Addendum)

## 2024-08-29 NOTE — Telephone Encounter (Signed)
 The therapist calls Trevor [name] at the request of Ms. Ellouise Dawn, NP with the [facility] as he is in the process of being discharged. The therapist confirms his identity via two identifers.  He says that he does not want to go to an inpatient residential treatment facility saying that he has been to 10 facilities in the past year. He says that his longest sobriety was 5 weeks ago for a period of 2 months. When asked what was working, he says that he was just sick and tired of using.  Trevor [name] says that once he leaves residential that he will relapse immediately. He reports having a place to stay but is not employed or on disability says that he takes the bus when he gets the money.   The therapist makes him aware of transportation via [facility] to medical appointments. He tested positive recently for benzodiazepines says that he takes them as needed three or four times per week having no prescription for them.   Trevor [name] says that he has attended NA meetings but not currently. Optimally, Trevor [name] needs detox, residential treatment, and likely a referral to sober living but apparently does not want to do any of this at this time expressing some ambivalence about stopping and adding that addiction is a relapsing disease.  At this point, he wants outpatient treatment to talk about his addiction. He is scheduled to come in tomorrow at 7 a.m. for a walk-in [facility].  Trevor Maier, MA, LCSW, [facility], LCAS 08/29/2024

## 2024-08-30 ENCOUNTER — Ambulatory Visit (INDEPENDENT_AMBULATORY_CARE_PROVIDER_SITE_OTHER): Payer: MEDICAID | Admitting: Physician Assistant

## 2024-08-30 VITALS — BP 126/96 | HR 88 | Ht 71.0 in | Wt 164.0 lb

## 2024-08-30 DIAGNOSIS — F411 Generalized anxiety disorder: Secondary | ICD-10-CM | POA: Diagnosis not present

## 2024-08-30 DIAGNOSIS — Z8659 Personal history of other mental and behavioral disorders: Secondary | ICD-10-CM

## 2024-08-30 DIAGNOSIS — F3130 Bipolar disorder, current episode depressed, mild or moderate severity, unspecified: Secondary | ICD-10-CM

## 2024-08-30 MED ORDER — ERZOFRI 351 MG/2.25ML IM SUSY: 351.0000 mg | PREFILLED_SYRINGE | INTRAMUSCULAR | 11 refills | Status: DC
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
## 2024-09-04 ENCOUNTER — Encounter (HOSPITAL_COMMUNITY): Payer: Self-pay | Admitting: Physician Assistant

## 2024-09-06 ENCOUNTER — Ambulatory Visit (HOSPITAL_COMMUNITY): Payer: MEDICAID

## 2024-09-06 VITALS — BP 126/75 | HR 101 | Ht 71.0 in | Wt 167.0 lb

## 2024-09-06 DIAGNOSIS — F19951 Other psychoactive substance use, unspecified with psychoactive substance-induced psychotic disorder with hallucinations: Secondary | ICD-10-CM

## 2024-09-06 DIAGNOSIS — F19929 Other psychoactive substance use, unspecified with intoxication, unspecified: Secondary | ICD-10-CM | POA: Diagnosis not present

## 2024-09-06 NOTE — Progress Notes (Unsigned)
 Patient presented to office for injection of Erzofri 351 mg. Per patient the medication wore off a week early. Patient received injection in left deltoid. Patient tolerated injection well. Patient denies SI/HIVH. Patient reported mumbling voices for a week, in relation to medication wearing off. Patient provider was notified of patient concerns. Patient denies any additional concerns. Patient will return in 30 days. Patient left clinic alert and ambulatory.

## 2024-09-27 ENCOUNTER — Ambulatory Visit (HOSPITAL_COMMUNITY): Payer: MEDICAID

## 2024-10-09 ENCOUNTER — Ambulatory Visit (INDEPENDENT_AMBULATORY_CARE_PROVIDER_SITE_OTHER): Payer: MEDICAID

## 2024-10-09 ENCOUNTER — Ambulatory Visit (INDEPENDENT_AMBULATORY_CARE_PROVIDER_SITE_OTHER): Payer: MEDICAID | Admitting: Psychiatry

## 2024-10-09 ENCOUNTER — Other Ambulatory Visit (HOSPITAL_COMMUNITY): Payer: Self-pay | Admitting: Physician Assistant

## 2024-10-09 ENCOUNTER — Encounter (HOSPITAL_COMMUNITY): Payer: Self-pay

## 2024-10-09 VITALS — BP 135/70 | HR 85 | Temp 98.4°F | Ht 71.0 in | Wt 176.0 lb

## 2024-10-09 DIAGNOSIS — F3130 Bipolar disorder, current episode depressed, mild or moderate severity, unspecified: Secondary | ICD-10-CM

## 2024-10-09 DIAGNOSIS — G2401 Drug induced subacute dyskinesia: Secondary | ICD-10-CM

## 2024-10-09 DIAGNOSIS — Z8659 Personal history of other mental and behavioral disorders: Secondary | ICD-10-CM

## 2024-10-09 MED ORDER — BENZTROPINE MESYLATE 0.5 MG PO TABS: 0.5000 mg | ORAL_TABLET | Freq: Two times a day (BID) | ORAL | 1 refills | Status: AC

## 2024-10-09 MED ORDER — INVEGA SUSTENNA 156 MG/ML IM SUSY: 156.0000 mg | PREFILLED_SYRINGE | Freq: Once | INTRAMUSCULAR | 0 refills | Status: AC

## 2024-10-09 MED ORDER — PALIPERIDONE PALMITATE ER 156 MG/ML IM SUSY: 156.0000 mg | PREFILLED_SYRINGE | Freq: Once | INTRAMUSCULAR | Status: DC

## 2024-10-09 MED ORDER — PALIPERIDONE PALMITATE ER 156 MG/ML IM SUSY
156.0000 mg | PREFILLED_SYRINGE | INTRAMUSCULAR | Status: AC
  Administered 2024-10-09: 156 mg via INTRAMUSCULAR

## 2024-10-09 NOTE — Progress Notes (Signed)
 Pt presented for Injection of Invega  156 mg   in Right Deltoid. Pt tolerated injection well although he stated it tingled after receiving injection. I advised for Pt to sit and wait to be monitored for a 5 mins. Pt states he will be fine and was ready for discharge. Pt denies SI/HI/AVH. However, Pt states he's been real jittery and has had a few episodes but nothing major. Per Dr. Zane Mouse and Reginia Gwynn) [place], Pt medication was switch from Erzofril 351 mg to Invega  156 mg. No additional concerns. Pt will return in 28 days.   CJT-CMA
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
## 2024-10-09 NOTE — Progress Notes (Signed)
 Patient was administered Invega  Sustenna 156 mg during his appointment.  During his assessment, patient was assessed for tardive dyskinesia.  There are some noted involuntary movements observed in his tongue.  Provider discussed medication options for the management of his TD.  Patient was agreeable to starting 0.5 mg 2 times daily.  Patient's medication to be e-prescribed to pharmacy of choice.
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
## 2024-10-11 ENCOUNTER — Ambulatory Visit (HOSPITAL_COMMUNITY): Payer: MEDICAID

## 2024-10-12 ENCOUNTER — Encounter (HOSPITAL_COMMUNITY): Payer: MEDICAID

## 2024-11-06 ENCOUNTER — Ambulatory Visit (HOSPITAL_COMMUNITY): Payer: MEDICAID
